# Patient Record
Sex: Male | Born: 1935 | Race: White | Hispanic: No | Marital: Married | Smoking: Never smoker
Health system: Southern US, Community
[De-identification: ages and names within clinical notes are randomized; demographics above are authoritative.]

## PROBLEM LIST (undated history)

## (undated) DIAGNOSIS — B9681 Helicobacter pylori [H. pylori] as the cause of diseases classified elsewhere: Secondary | ICD-10-CM

## (undated) DIAGNOSIS — I10 Essential (primary) hypertension: Secondary | ICD-10-CM

## (undated) DIAGNOSIS — D649 Anemia, unspecified: Secondary | ICD-10-CM

## (undated) DIAGNOSIS — M48061 Spinal stenosis, lumbar region without neurogenic claudication: Secondary | ICD-10-CM

## (undated) DIAGNOSIS — I2510 Atherosclerotic heart disease of native coronary artery without angina pectoris: Secondary | ICD-10-CM

## (undated) DIAGNOSIS — I350 Nonrheumatic aortic (valve) stenosis: Secondary | ICD-10-CM

## (undated) DIAGNOSIS — N400 Enlarged prostate without lower urinary tract symptoms: Secondary | ICD-10-CM

## (undated) DIAGNOSIS — R0602 Shortness of breath: Secondary | ICD-10-CM

## (undated) DIAGNOSIS — I739 Peripheral vascular disease, unspecified: Secondary | ICD-10-CM

## (undated) DIAGNOSIS — T84012A Broken internal right knee prosthesis, initial encounter: Secondary | ICD-10-CM

## (undated) DIAGNOSIS — I441 Atrioventricular block, second degree: Secondary | ICD-10-CM

## (undated) DIAGNOSIS — K219 Gastro-esophageal reflux disease without esophagitis: Secondary | ICD-10-CM

## (undated) DIAGNOSIS — B029 Zoster without complications: Secondary | ICD-10-CM

## (undated) DIAGNOSIS — K279 Peptic ulcer, site unspecified, unspecified as acute or chronic, without hemorrhage or perforation: Secondary | ICD-10-CM

## (undated) DIAGNOSIS — M1990 Unspecified osteoarthritis, unspecified site: Secondary | ICD-10-CM

## (undated) HISTORY — PX: CARDIAC CATHETERIZATION: SHX172

## (undated) HISTORY — PX: CATARACT EXTRACTION, BILATERAL: SHX1313

## (undated) HISTORY — DX: Zoster without complications: B02.9

## (undated) HISTORY — PX: FOOT ARTHROTOMY: SHX952

## (undated) HISTORY — PX: TONSILLECTOMY: SUR1361

## (undated) HISTORY — DX: Spinal stenosis, lumbar region without neurogenic claudication: M48.061

## (undated) HISTORY — PX: REPLACEMENT TOTAL KNEE: SUR1224

## (undated) HISTORY — DX: Peptic ulcer, site unspecified, unspecified as acute or chronic, without hemorrhage or perforation: B96.81

## (undated) HISTORY — PX: HUMERUS FRACTURE SURGERY: SHX670

## (undated) HISTORY — PX: TOE AMPUTATION: SHX809

## (undated) HISTORY — PX: ROTATOR CUFF REPAIR: SHX139

## (undated) HISTORY — PX: AORTIC VALVE REPLACEMENT: SHX41

## (undated) HISTORY — DX: Peptic ulcer, site unspecified, unspecified as acute or chronic, without hemorrhage or perforation: K27.9

---

## 1998-05-21 ENCOUNTER — Inpatient Hospital Stay (HOSPITAL_COMMUNITY): Admission: RE | Admit: 1998-05-21 | Discharge: 1998-05-26 | Payer: Self-pay | Admitting: Orthopedic Surgery

## 1998-05-28 ENCOUNTER — Encounter (HOSPITAL_COMMUNITY): Admission: RE | Admit: 1998-05-28 | Discharge: 1998-08-26 | Payer: Self-pay | Admitting: Orthopedic Surgery

## 1998-10-20 ENCOUNTER — Encounter: Payer: Self-pay | Admitting: Orthopedic Surgery

## 1998-10-21 ENCOUNTER — Inpatient Hospital Stay (HOSPITAL_COMMUNITY): Admission: EM | Admit: 1998-10-21 | Discharge: 1998-10-22 | Payer: Self-pay | Admitting: Orthopedic Surgery

## 1998-10-27 ENCOUNTER — Inpatient Hospital Stay (HOSPITAL_COMMUNITY): Admission: EM | Admit: 1998-10-27 | Discharge: 1998-10-31 | Payer: Self-pay | Admitting: Orthopedic Surgery

## 1998-12-10 ENCOUNTER — Encounter: Admission: RE | Admit: 1998-12-10 | Discharge: 1998-12-10 | Payer: Self-pay | Admitting: Infectious Diseases

## 1998-12-24 ENCOUNTER — Ambulatory Visit (HOSPITAL_COMMUNITY): Admission: RE | Admit: 1998-12-24 | Discharge: 1998-12-24 | Payer: Self-pay | Admitting: Internal Medicine

## 1999-01-20 ENCOUNTER — Encounter: Admission: RE | Admit: 1999-01-20 | Discharge: 1999-01-20 | Payer: Self-pay | Admitting: Infectious Diseases

## 2000-01-28 ENCOUNTER — Encounter (INDEPENDENT_AMBULATORY_CARE_PROVIDER_SITE_OTHER): Payer: Self-pay | Admitting: Specialist

## 2000-01-28 ENCOUNTER — Other Ambulatory Visit: Admission: RE | Admit: 2000-01-28 | Discharge: 2000-01-28 | Payer: Self-pay | Admitting: Gastroenterology

## 2000-06-02 ENCOUNTER — Ambulatory Visit (HOSPITAL_COMMUNITY): Admission: RE | Admit: 2000-06-02 | Discharge: 2000-06-02 | Payer: Self-pay | Admitting: Orthopedic Surgery

## 2000-06-02 ENCOUNTER — Encounter: Payer: Self-pay | Admitting: Orthopedic Surgery

## 2000-06-03 ENCOUNTER — Encounter: Admission: RE | Admit: 2000-06-03 | Discharge: 2000-06-03 | Payer: Self-pay | Admitting: Internal Medicine

## 2000-07-08 ENCOUNTER — Encounter: Admission: RE | Admit: 2000-07-08 | Discharge: 2000-07-08 | Payer: Self-pay | Admitting: Infectious Diseases

## 2000-08-26 ENCOUNTER — Encounter: Admission: RE | Admit: 2000-08-26 | Discharge: 2000-08-26 | Payer: Self-pay | Admitting: Infectious Diseases

## 2000-09-27 ENCOUNTER — Encounter: Admission: RE | Admit: 2000-09-27 | Discharge: 2000-09-27 | Payer: Self-pay | Admitting: Infectious Diseases

## 2000-11-04 ENCOUNTER — Encounter: Payer: Self-pay | Admitting: Orthopedic Surgery

## 2000-11-09 ENCOUNTER — Inpatient Hospital Stay (HOSPITAL_COMMUNITY): Admission: RE | Admit: 2000-11-09 | Discharge: 2000-11-14 | Payer: Self-pay | Admitting: Orthopedic Surgery

## 2000-12-08 ENCOUNTER — Encounter: Admission: RE | Admit: 2000-12-08 | Discharge: 2001-02-03 | Payer: Self-pay | Admitting: *Deleted

## 2002-09-04 ENCOUNTER — Ambulatory Visit (HOSPITAL_BASED_OUTPATIENT_CLINIC_OR_DEPARTMENT_OTHER): Admission: RE | Admit: 2002-09-04 | Discharge: 2002-09-04 | Payer: Self-pay | Admitting: Internal Medicine

## 2002-09-04 ENCOUNTER — Encounter: Payer: Self-pay | Admitting: Pulmonary Disease

## 2002-12-07 ENCOUNTER — Encounter: Payer: Self-pay | Admitting: Pulmonary Disease

## 2005-02-04 ENCOUNTER — Ambulatory Visit: Payer: Self-pay | Admitting: Family Medicine

## 2005-11-18 ENCOUNTER — Ambulatory Visit: Payer: Self-pay | Admitting: Family Medicine

## 2005-11-29 ENCOUNTER — Ambulatory Visit: Payer: Self-pay | Admitting: Cardiology

## 2005-12-31 ENCOUNTER — Ambulatory Visit: Payer: Self-pay

## 2005-12-31 ENCOUNTER — Encounter: Payer: Self-pay | Admitting: Cardiology

## 2006-05-04 ENCOUNTER — Ambulatory Visit: Payer: Self-pay | Admitting: Internal Medicine

## 2006-06-29 ENCOUNTER — Ambulatory Visit: Payer: Self-pay | Admitting: Internal Medicine

## 2006-09-29 ENCOUNTER — Ambulatory Visit: Payer: Self-pay | Admitting: Internal Medicine

## 2006-09-29 LAB — CONVERTED CEMR LAB
ALT: 25 units/L (ref 0–40)
BUN: 18 mg/dL (ref 6–23)
Basophils Absolute: 0 10*3/uL (ref 0.0–0.1)
Creatinine, Ser: 1.1 mg/dL (ref 0.4–1.5)
Creatinine,U: 150.1 mg/dL
Eosinophil percent: 3.2 % (ref 0.0–5.0)
Glomerular Filtration Rate, Af Am: 83 mL/min/{1.73_m2}
HCT: 48 % (ref 39.0–52.0)
Hemoglobin: 16.2 g/dL (ref 13.0–17.0)
LDL DIRECT: 99.5 mg/dL
Lymphocytes Relative: 20.4 % (ref 12.0–46.0)
MCHC: 33.6 g/dL (ref 30.0–36.0)
MCV: 95.7 fL (ref 78.0–100.0)
Monocytes Absolute: 0.9 10*3/uL — ABNORMAL HIGH (ref 0.2–0.7)
Neutro Abs: 4.1 10*3/uL (ref 1.4–7.7)
Neutrophils Relative %: 61.6 % (ref 43.0–77.0)
Potassium: 4.9 meq/L (ref 3.5–5.1)
Sodium: 138 meq/L (ref 135–145)
TSH: 0.66 microintl units/mL (ref 0.35–5.50)
WBC: 6.5 10*3/uL (ref 4.5–10.5)

## 2006-10-07 ENCOUNTER — Ambulatory Visit: Payer: Self-pay | Admitting: Cardiology

## 2006-10-28 ENCOUNTER — Ambulatory Visit: Payer: Self-pay

## 2007-02-16 ENCOUNTER — Ambulatory Visit: Payer: Self-pay | Admitting: Internal Medicine

## 2007-02-16 LAB — CONVERTED CEMR LAB
ALT: 38 units/L (ref 0–40)
AST: 33 units/L (ref 0–37)
Bilirubin, Direct: 0.1 mg/dL (ref 0.0–0.3)
CO2: 23 meq/L (ref 19–32)
Calcium: 9 mg/dL (ref 8.4–10.5)
Chloride: 100 meq/L (ref 96–112)
Cholesterol: 207 mg/dL (ref 0–200)
GFR calc Af Amer: 83 mL/min
Glucose, Bld: 158 mg/dL — ABNORMAL HIGH (ref 70–99)
Sodium: 132 meq/L — ABNORMAL LOW (ref 135–145)
Total Protein: 6.9 g/dL (ref 6.0–8.3)
Triglycerides: 390 mg/dL (ref 0–149)

## 2007-05-19 ENCOUNTER — Ambulatory Visit: Payer: Self-pay | Admitting: Gastroenterology

## 2007-06-08 ENCOUNTER — Encounter: Payer: Self-pay | Admitting: Family Medicine

## 2007-12-07 ENCOUNTER — Ambulatory Visit: Payer: Self-pay | Admitting: Internal Medicine

## 2007-12-07 DIAGNOSIS — I10 Essential (primary) hypertension: Secondary | ICD-10-CM | POA: Insufficient documentation

## 2007-12-07 DIAGNOSIS — D509 Iron deficiency anemia, unspecified: Secondary | ICD-10-CM | POA: Insufficient documentation

## 2007-12-07 DIAGNOSIS — E039 Hypothyroidism, unspecified: Secondary | ICD-10-CM

## 2007-12-07 DIAGNOSIS — N390 Urinary tract infection, site not specified: Secondary | ICD-10-CM

## 2007-12-07 DIAGNOSIS — R972 Elevated prostate specific antigen [PSA]: Secondary | ICD-10-CM | POA: Insufficient documentation

## 2007-12-07 DIAGNOSIS — T50995A Adverse effect of other drugs, medicaments and biological substances, initial encounter: Secondary | ICD-10-CM

## 2007-12-07 LAB — CONVERTED CEMR LAB
ALT: 26 units/L (ref 0–53)
AST: 25 units/L (ref 0–37)
Basophils Relative: 0.1 % (ref 0.0–1.0)
Bilirubin, Direct: 0.2 mg/dL (ref 0.0–0.3)
CO2: 24 meq/L (ref 19–32)
Calcium: 9.1 mg/dL (ref 8.4–10.5)
Chloride: 101 meq/L (ref 96–112)
Eosinophils Absolute: 0.3 10*3/uL (ref 0.0–0.6)
Eosinophils Relative: 5.6 % — ABNORMAL HIGH (ref 0.0–5.0)
GFR calc non Af Amer: 62 mL/min
Glucose, Bld: 199 mg/dL — ABNORMAL HIGH (ref 70–99)
HDL: 45.6 mg/dL (ref >39.0)
Hgb A1c MFr Bld: 7.8 % — ABNORMAL HIGH (ref 4.6–6.0)
Ketones, urine, test strip: NEGATIVE
Nitrite: NEGATIVE
PSA: 3.88 ng/mL (ref 0.10–4.00)
Platelets: 160 10*3/uL (ref 150–400)
RBC: 4.99 M/uL (ref 4.22–5.81)
RDW: 13 % (ref 11.5–14.6)
TSH: 0.92 microintl units/mL (ref 0.35–5.50)
Total CHOL/HDL Ratio: 3.9
Triglycerides: 293 mg/dL (ref 0–149)
Urobilinogen, UA: 0.2
VLDL: 59 mg/dL — ABNORMAL HIGH (ref 0–40)
WBC: 5.7 10*3/uL (ref 4.5–10.5)

## 2007-12-11 ENCOUNTER — Ambulatory Visit: Payer: Self-pay | Admitting: Cardiology

## 2007-12-22 ENCOUNTER — Ambulatory Visit: Payer: Self-pay

## 2007-12-22 ENCOUNTER — Encounter: Payer: Self-pay | Admitting: Cardiology

## 2008-04-24 ENCOUNTER — Ambulatory Visit: Payer: Self-pay | Admitting: Family Medicine

## 2008-04-24 LAB — CONVERTED CEMR LAB: Hgb A1c MFr Bld: 8.4 % — ABNORMAL HIGH (ref 4.6–6.0)

## 2008-06-28 ENCOUNTER — Encounter: Payer: Self-pay | Admitting: Family Medicine

## 2008-08-07 ENCOUNTER — Ambulatory Visit: Payer: Self-pay | Admitting: Internal Medicine

## 2008-08-08 ENCOUNTER — Encounter: Payer: Self-pay | Admitting: Internal Medicine

## 2008-10-22 ENCOUNTER — Telehealth: Payer: Self-pay | Admitting: Internal Medicine

## 2008-12-26 ENCOUNTER — Encounter: Payer: Self-pay | Admitting: Family Medicine

## 2008-12-26 ENCOUNTER — Ambulatory Visit: Payer: Self-pay | Admitting: Internal Medicine

## 2008-12-26 LAB — CONVERTED CEMR LAB
ALT: 20 units/L (ref 0–53)
Albumin: 4 g/dL (ref 3.5–5.2)
Alkaline Phosphatase: 51 units/L (ref 39–117)
BUN: 24 mg/dL — ABNORMAL HIGH (ref 6–23)
Bilirubin Urine: NEGATIVE
CO2: 23 meq/L (ref 19–32)
Calcium: 9.3 mg/dL (ref 8.4–10.5)
Eosinophils Relative: 3.5 % (ref 0.0–5.0)
GFR calc Af Amer: 92 mL/min
Glucose, Bld: 163 mg/dL — ABNORMAL HIGH (ref 70–99)
HCT: 41.6 % (ref 39.0–52.0)
Hemoglobin: 14.1 g/dL (ref 13.0–17.0)
Microalb Creat Ratio: 21.5 mg/g (ref 0.0–30.0)
Microalb, Ur: 2.7 mg/dL — ABNORMAL HIGH (ref 0.0–1.9)
Monocytes Absolute: 0.7 10*3/uL (ref 0.1–1.0)
Monocytes Relative: 13.6 % — ABNORMAL HIGH (ref 3.0–12.0)
Neutro Abs: 3.3 10*3/uL (ref 1.4–7.7)
Nitrite: NEGATIVE
RBC: 4.16 M/uL — ABNORMAL LOW (ref 4.22–5.81)
Testosterone: 544.58 ng/dL (ref 350.00–890)
Total CHOL/HDL Ratio: 5.6
Total Protein: 6.9 g/dL (ref 6.0–8.3)
Urobilinogen, UA: 0.2
WBC Urine, dipstick: NEGATIVE
WBC: 5.3 10*3/uL (ref 4.5–10.5)

## 2008-12-30 ENCOUNTER — Ambulatory Visit: Payer: Self-pay | Admitting: Cardiology

## 2009-01-01 LAB — CONVERTED CEMR LAB: Vit D, 1,25-Dihydroxy: 5 — ABNORMAL LOW (ref 30–89)

## 2009-01-13 ENCOUNTER — Telehealth: Payer: Self-pay | Admitting: Family Medicine

## 2009-03-26 ENCOUNTER — Ambulatory Visit: Payer: Self-pay | Admitting: Internal Medicine

## 2009-03-26 LAB — CONVERTED CEMR LAB: Hgb A1c MFr Bld: 6.9 % — ABNORMAL HIGH (ref 4.6–6.5)

## 2009-04-21 ENCOUNTER — Encounter: Admission: RE | Admit: 2009-04-21 | Discharge: 2009-07-20 | Payer: Self-pay | Admitting: Family Medicine

## 2009-04-30 ENCOUNTER — Encounter: Payer: Self-pay | Admitting: Family Medicine

## 2009-05-27 ENCOUNTER — Encounter: Payer: Self-pay | Admitting: Family Medicine

## 2009-06-06 ENCOUNTER — Ambulatory Visit: Payer: Self-pay | Admitting: Family Medicine

## 2009-06-12 ENCOUNTER — Encounter: Payer: Self-pay | Admitting: Family Medicine

## 2009-06-16 ENCOUNTER — Encounter: Payer: Self-pay | Admitting: Family Medicine

## 2009-06-26 ENCOUNTER — Encounter (HOSPITAL_BASED_OUTPATIENT_CLINIC_OR_DEPARTMENT_OTHER): Admission: RE | Admit: 2009-06-26 | Discharge: 2009-07-22 | Payer: Self-pay | Admitting: General Surgery

## 2009-06-30 ENCOUNTER — Encounter: Payer: Self-pay | Admitting: Family Medicine

## 2009-07-03 ENCOUNTER — Ambulatory Visit: Payer: Self-pay | Admitting: Internal Medicine

## 2009-07-03 LAB — CONVERTED CEMR LAB: Hgb A1c MFr Bld: 6.3 % (ref 4.6–6.5)

## 2009-07-07 ENCOUNTER — Ambulatory Visit: Payer: Self-pay | Admitting: *Deleted

## 2009-07-07 ENCOUNTER — Ambulatory Visit: Admission: RE | Admit: 2009-07-07 | Discharge: 2009-07-07 | Payer: Self-pay | Admitting: General Surgery

## 2009-07-07 ENCOUNTER — Encounter (HOSPITAL_BASED_OUTPATIENT_CLINIC_OR_DEPARTMENT_OTHER): Payer: Self-pay | Admitting: General Surgery

## 2009-07-21 ENCOUNTER — Encounter: Payer: Self-pay | Admitting: Pulmonary Disease

## 2009-07-21 DIAGNOSIS — G4733 Obstructive sleep apnea (adult) (pediatric): Secondary | ICD-10-CM | POA: Insufficient documentation

## 2009-07-25 ENCOUNTER — Ambulatory Visit: Payer: Self-pay | Admitting: Pulmonary Disease

## 2009-10-03 ENCOUNTER — Encounter: Payer: Self-pay | Admitting: Cardiology

## 2009-10-16 ENCOUNTER — Ambulatory Visit: Payer: Self-pay | Admitting: Internal Medicine

## 2009-12-25 ENCOUNTER — Telehealth: Payer: Self-pay | Admitting: Family Medicine

## 2010-01-08 ENCOUNTER — Encounter: Payer: Self-pay | Admitting: Pulmonary Disease

## 2010-02-16 DIAGNOSIS — I25810 Atherosclerosis of coronary artery bypass graft(s) without angina pectoris: Secondary | ICD-10-CM | POA: Insufficient documentation

## 2010-02-16 DIAGNOSIS — I359 Nonrheumatic aortic valve disorder, unspecified: Secondary | ICD-10-CM | POA: Insufficient documentation

## 2010-02-16 DIAGNOSIS — E119 Type 2 diabetes mellitus without complications: Secondary | ICD-10-CM

## 2010-02-16 DIAGNOSIS — I10 Essential (primary) hypertension: Secondary | ICD-10-CM | POA: Insufficient documentation

## 2010-02-20 ENCOUNTER — Ambulatory Visit: Payer: Self-pay | Admitting: Cardiology

## 2010-02-25 ENCOUNTER — Ambulatory Visit: Payer: Self-pay | Admitting: Family Medicine

## 2010-02-25 LAB — CONVERTED CEMR LAB
ALT: 22 units/L (ref 0–53)
BUN: 19 mg/dL (ref 6–23)
Basophils Absolute: 0 10*3/uL (ref 0.0–0.1)
Basophils Relative: 0.2 % (ref 0.0–3.0)
Bilirubin Urine: NEGATIVE
Bilirubin, Direct: 0 mg/dL (ref 0.0–0.3)
Calcium: 8.5 mg/dL (ref 8.4–10.5)
Cholesterol: 122 mg/dL (ref 0–200)
Creatinine, Ser: 1 mg/dL (ref 0.4–1.5)
Eosinophils Absolute: 0.2 10*3/uL (ref 0.0–0.7)
GFR calc non Af Amer: 75.97 mL/min (ref >60)
HDL: 48.9 mg/dL (ref >39.00)
Hgb A1c MFr Bld: 6.9 % — ABNORMAL HIGH (ref 4.6–6.5)
Ketones, urine, test strip: NEGATIVE
LDL Cholesterol: 37 mg/dL (ref 0–99)
Lymphocytes Relative: 18.3 % (ref 12.0–46.0)
MCHC: 32.7 g/dL (ref 30.0–36.0)
MCV: 96.6 fL (ref 78.0–100.0)
Microalb Creat Ratio: 5.9 mg/g (ref 0.0–30.0)
Microalb, Ur: 0.5 mg/dL (ref 0.0–1.9)
Monocytes Absolute: 0.9 10*3/uL (ref 0.1–1.0)
Neutro Abs: 5.3 10*3/uL (ref 1.4–7.7)
Neutrophils Relative %: 67.3 % (ref 43.0–77.0)
Nitrite: NEGATIVE
PSA: 3.26 ng/mL (ref 0.10–4.00)
Protein, U semiquant: NEGATIVE
RBC: 4.24 M/uL (ref 4.22–5.81)
RDW: 13.3 % (ref 11.5–14.6)
TSH: 0.99 microintl units/mL (ref 0.35–5.50)
Total Bilirubin: 0.3 mg/dL (ref 0.3–1.2)
Total CHOL/HDL Ratio: 2
Triglycerides: 182 mg/dL — ABNORMAL HIGH (ref 0.0–149.0)
Urobilinogen, UA: 0.2
VLDL: 36.4 mg/dL (ref 0.0–40.0)

## 2010-03-13 ENCOUNTER — Ambulatory Visit: Payer: Self-pay

## 2010-03-13 ENCOUNTER — Encounter: Payer: Self-pay | Admitting: Cardiology

## 2010-03-13 ENCOUNTER — Ambulatory Visit: Payer: Self-pay | Admitting: Cardiology

## 2010-03-13 ENCOUNTER — Ambulatory Visit (HOSPITAL_COMMUNITY): Admission: RE | Admit: 2010-03-13 | Discharge: 2010-03-13 | Payer: Self-pay | Admitting: Cardiology

## 2010-03-30 ENCOUNTER — Encounter: Payer: Self-pay | Admitting: Family Medicine

## 2010-05-22 ENCOUNTER — Ambulatory Visit: Payer: Self-pay | Admitting: Family Medicine

## 2010-06-09 ENCOUNTER — Encounter: Payer: Self-pay | Admitting: Cardiovascular Disease

## 2010-06-09 ENCOUNTER — Encounter: Payer: Self-pay | Admitting: Cardiology

## 2010-06-12 ENCOUNTER — Ambulatory Visit: Payer: Self-pay | Admitting: Family Medicine

## 2010-06-12 LAB — CONVERTED CEMR LAB
BUN: 22 mg/dL (ref 6–23)
Basophils Absolute: 0 10*3/uL (ref 0.0–0.1)
Calcium: 9 mg/dL (ref 8.4–10.5)
Eosinophils Relative: 1.9 % (ref 0.0–5.0)
GFR calc non Af Amer: 94.13 mL/min (ref >60)
Glucose, Bld: 91 mg/dL (ref 70–99)
Lymphocytes Relative: 13.8 % (ref 12.0–46.0)
Monocytes Relative: 10.8 % (ref 3.0–12.0)
Platelets: 206 10*3/uL (ref 150.0–400.0)
Potassium: 5 meq/L (ref 3.5–5.1)
RDW: 17.2 % — ABNORMAL HIGH (ref 11.5–14.6)
Sodium: 136 meq/L (ref 135–145)
WBC: 8 10*3/uL (ref 4.5–10.5)
aPTT: 25.9 s (ref 21.7–28.8)

## 2010-06-19 ENCOUNTER — Ambulatory Visit (HOSPITAL_COMMUNITY): Admission: RE | Admit: 2010-06-19 | Discharge: 2010-06-19 | Payer: Self-pay | Admitting: Cardiology

## 2010-06-19 ENCOUNTER — Ambulatory Visit: Payer: Self-pay | Admitting: Cardiology

## 2010-06-23 ENCOUNTER — Encounter: Payer: Self-pay | Admitting: Cardiology

## 2010-06-24 ENCOUNTER — Ambulatory Visit: Payer: Self-pay | Admitting: Cardiology

## 2010-06-24 ENCOUNTER — Telehealth: Payer: Self-pay | Admitting: Cardiology

## 2010-06-24 DIAGNOSIS — I2510 Atherosclerotic heart disease of native coronary artery without angina pectoris: Secondary | ICD-10-CM | POA: Insufficient documentation

## 2010-06-25 ENCOUNTER — Ambulatory Visit: Payer: Self-pay | Admitting: Cardiothoracic Surgery

## 2010-06-25 ENCOUNTER — Encounter: Payer: Self-pay | Admitting: Family Medicine

## 2010-07-10 ENCOUNTER — Encounter (HOSPITAL_BASED_OUTPATIENT_CLINIC_OR_DEPARTMENT_OTHER): Admission: RE | Admit: 2010-07-10 | Discharge: 2010-10-08 | Payer: Self-pay | Admitting: General Surgery

## 2010-07-30 ENCOUNTER — Ambulatory Visit: Payer: Self-pay | Admitting: Cardiothoracic Surgery

## 2010-07-30 ENCOUNTER — Encounter: Payer: Self-pay | Admitting: Cardiology

## 2010-08-13 ENCOUNTER — Encounter: Payer: Self-pay | Admitting: Cardiothoracic Surgery

## 2010-08-17 ENCOUNTER — Ambulatory Visit: Payer: Self-pay | Admitting: Cardiology

## 2010-08-17 ENCOUNTER — Encounter: Payer: Self-pay | Admitting: Cardiothoracic Surgery

## 2010-08-17 ENCOUNTER — Ambulatory Visit: Payer: Self-pay | Admitting: Cardiothoracic Surgery

## 2010-08-17 ENCOUNTER — Inpatient Hospital Stay (HOSPITAL_COMMUNITY): Admission: RE | Admit: 2010-08-17 | Discharge: 2010-08-22 | Payer: Self-pay | Admitting: Cardiothoracic Surgery

## 2010-08-28 ENCOUNTER — Encounter: Payer: Self-pay | Admitting: Cardiology

## 2010-09-01 DIAGNOSIS — Z952 Presence of prosthetic heart valve: Secondary | ICD-10-CM | POA: Insufficient documentation

## 2010-09-04 ENCOUNTER — Ambulatory Visit: Payer: Self-pay | Admitting: Cardiology

## 2010-09-04 DIAGNOSIS — I441 Atrioventricular block, second degree: Secondary | ICD-10-CM

## 2010-09-08 ENCOUNTER — Encounter: Payer: Self-pay | Admitting: Cardiology

## 2010-09-14 ENCOUNTER — Encounter: Payer: Self-pay | Admitting: Cardiology

## 2010-09-14 ENCOUNTER — Ambulatory Visit: Payer: Self-pay | Admitting: Cardiothoracic Surgery

## 2010-09-14 ENCOUNTER — Encounter: Admission: RE | Admit: 2010-09-14 | Discharge: 2010-09-14 | Payer: Self-pay | Admitting: Cardiothoracic Surgery

## 2010-09-16 ENCOUNTER — Ambulatory Visit: Payer: Self-pay | Admitting: Family Medicine

## 2010-09-17 ENCOUNTER — Ambulatory Visit: Payer: Self-pay | Admitting: Family Medicine

## 2010-09-17 LAB — CONVERTED CEMR LAB
BUN: 31 mg/dL — ABNORMAL HIGH (ref 6–23)
Basophils Absolute: 0 10*3/uL (ref 0.0–0.1)
Chloride: 99 meq/L (ref 96–112)
Eosinophils Relative: 0.8 % (ref 0.0–5.0)
Glucose, Bld: 132 mg/dL — ABNORMAL HIGH (ref 70–99)
HCT: 34 % — ABNORMAL LOW (ref 39.0–52.0)
Hemoglobin: 11.1 g/dL — ABNORMAL LOW (ref 13.0–17.0)
Hgb A1c MFr Bld: 7 % — ABNORMAL HIGH (ref 4.6–6.5)
Lymphs Abs: 1.5 10*3/uL (ref 0.7–4.0)
MCV: 90 fL (ref 78.0–100.0)
Monocytes Absolute: 1.4 10*3/uL — ABNORMAL HIGH (ref 0.1–1.0)
Neutro Abs: 7.1 10*3/uL (ref 1.4–7.7)
Platelets: 270 10*3/uL (ref 150.0–400.0)
Potassium: 4.9 meq/L (ref 3.5–5.1)
RDW: 16.7 % — ABNORMAL HIGH (ref 11.5–14.6)

## 2010-09-20 ENCOUNTER — Encounter (HOSPITAL_COMMUNITY)
Admission: RE | Admit: 2010-09-20 | Discharge: 2010-12-19 | Payer: Self-pay | Source: Home / Self Care | Attending: Cardiology | Admitting: Cardiology

## 2010-09-24 ENCOUNTER — Encounter: Payer: Self-pay | Admitting: Family Medicine

## 2010-09-29 ENCOUNTER — Telehealth: Payer: Self-pay | Admitting: Family Medicine

## 2010-10-01 ENCOUNTER — Encounter: Payer: Self-pay | Admitting: Family Medicine

## 2010-10-01 ENCOUNTER — Ambulatory Visit: Payer: Self-pay | Admitting: Cardiothoracic Surgery

## 2010-10-02 ENCOUNTER — Encounter: Payer: Self-pay | Admitting: Family Medicine

## 2010-10-02 ENCOUNTER — Telehealth: Payer: Self-pay | Admitting: Family Medicine

## 2010-10-07 ENCOUNTER — Encounter: Payer: Self-pay | Admitting: Cardiology

## 2010-10-08 ENCOUNTER — Ambulatory Visit: Payer: Self-pay | Admitting: Cardiology

## 2010-10-08 DIAGNOSIS — I959 Hypotension, unspecified: Secondary | ICD-10-CM | POA: Insufficient documentation

## 2010-10-09 ENCOUNTER — Encounter
Admission: RE | Admit: 2010-10-09 | Discharge: 2011-01-05 | Payer: Self-pay | Source: Home / Self Care | Attending: General Surgery | Admitting: General Surgery

## 2010-11-08 ENCOUNTER — Encounter: Payer: Self-pay | Admitting: Cardiology

## 2010-11-10 ENCOUNTER — Telehealth: Payer: Self-pay | Admitting: Family Medicine

## 2010-11-20 ENCOUNTER — Telehealth: Payer: Self-pay | Admitting: Family Medicine

## 2010-11-24 ENCOUNTER — Ambulatory Visit: Payer: Self-pay | Admitting: Family Medicine

## 2010-11-26 ENCOUNTER — Ambulatory Visit: Payer: Self-pay | Admitting: Cardiothoracic Surgery

## 2010-11-26 ENCOUNTER — Encounter: Payer: Self-pay | Admitting: Family Medicine

## 2010-12-01 LAB — CONVERTED CEMR LAB
ALT: 17 units/L (ref 0–53)
AST: 22 units/L (ref 0–37)
Albumin: 4.1 g/dL (ref 3.5–5.2)
Alkaline Phosphatase: 69 units/L (ref 39–117)
Basophils Absolute: 0.1 10*3/uL (ref 0.0–0.1)
Bilirubin, Direct: 0.1 mg/dL (ref 0.0–0.3)
Cholesterol: 190 mg/dL (ref 0–200)
Eosinophils Absolute: 0.1 10*3/uL (ref 0.0–0.7)
HCT: 32.4 % — ABNORMAL LOW (ref 39.0–52.0)
Lymphs Abs: 1.4 10*3/uL (ref 0.7–4.0)
MCHC: 32.4 g/dL (ref 30.0–36.0)
Monocytes Relative: 13.3 % — ABNORMAL HIGH (ref 3.0–12.0)
Neutro Abs: 4.9 10*3/uL (ref 1.4–7.7)
Platelets: 296 10*3/uL (ref 150.0–400.0)
RDW: 18.5 % — ABNORMAL HIGH (ref 11.5–14.6)
Total Protein: 6.7 g/dL (ref 6.0–8.3)
VLDL: 41.6 mg/dL — ABNORMAL HIGH (ref 0.0–40.0)

## 2010-12-25 ENCOUNTER — Encounter: Payer: Self-pay | Admitting: Cardiology

## 2010-12-31 ENCOUNTER — Telehealth: Payer: Self-pay | Admitting: Family Medicine

## 2011-01-04 ENCOUNTER — Ambulatory Visit
Admission: RE | Admit: 2011-01-04 | Discharge: 2011-01-04 | Payer: Self-pay | Source: Home / Self Care | Attending: Cardiology | Admitting: Cardiology

## 2011-01-04 ENCOUNTER — Encounter: Payer: Self-pay | Admitting: Cardiology

## 2011-01-17 LAB — CONVERTED CEMR LAB: LDL Goal: 100 mg/dL

## 2011-01-19 NOTE — Letter (Signed)
Summary: [facility]-Additional Note  [facility]-Additional Note   Imported By: Maryln Gottron 09/23/2010 10:46:58  _____________________________________________________________________  External Attachment:    Type:   Image     Comment:   External Document

## 2011-01-19 NOTE — Assessment & Plan Note (Signed)
Summary: f1y/jml   Visit Type:  1 yr f/u Primary Provider:  Nelwyn Salisbury MD  CC:  no cardiac complaints today.  History of Present Illness: Stanley Stanley [name] returns today for evaluation management of his history of subclinical coronary disease, nonobstructive carotid artery disease, mild aortic stenosis, hypertension, hyperlipidemia, and obstructive sleep apnea.  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 He offers no complaints today including angina, ischemic equivalent symptoms, presyncope, syncope, palpitations, or dyspnea on exertion.  His blood work is being followed by his primary care, Stanley.Fry. He is due lipids which you have done their office  Lipid Management History:      Positive NCEP/ATP III risk factors include male age 30 years old or older, diabetes, and hypertension.  Negative NCEP/ATP III risk factors include non-tobacco-user status.    Current Medications (verified): 1)  Prochlorperazine Maleate 10 Mg Tabs (Prochlorperazine Maleate) .Marland Kitchen.. 1 Every 4-6 Hrs As Needed Nausea Vomiting 2)  Glipizide Xl 10 Mg Xr24h-Tab (Glipizide) .... Two Tabs Once Daily 3)  Diclofenac Sodium 75 Mg Tbec (Diclofenac Sodium) .Marland Kitchen.. 1 By Mouth Two Times A Day After Meals 4)  Flomax 0.4 Mg Caps (Tamsulosin Hcl) .... Once Daily 5)  Actos 30 Mg Tabs (Pioglitazone Hcl) .... Once Daily 6)  Crestor 10 Mg Tabs (Rosuvastatin Calcium) .... Once Daily 7)  Zetia 10 Mg Tabs (Ezetimibe) .... Once Daily 8)  Avodart 0.5 Mg Caps (Dutasteride) .... Once Daily 9)  Aspir-Low 81 Mg Tbec (Aspirin) .... Once Daily 10)  Benicar 20 Mg Tabs (Olmesartan Medoxomil) .... Once Daily 11)  Janumet 50-500 Mg Tabs (Sitagliptin-Metformin Hcl) .Marland Kitchen.. 1 Tab Once Daily 12)  Lotrisone 1-0.05 % Crea (Clotrimazole-Betamethasone) .... Apply Two Times A Day As Directed 13)  Calcium-Vitamin D 500-200 Mg-Unit Tabs (Calcium Carbonate-Vitamin D) .Marland Kitchen.. 1 Tab Once Daily 14)  Cpap .... At Bedtime  Allergies: 1)  ! Ace Inhibitors  Past  History:  Past Medical History: Last updated: 02/28/10 CAROTID ARTERY DISEASE (ICD-433.10) AORTIC STENOSIS (ICD-424.1) HYPERTENSION (ICD-401.9) HYPERLIPIDEMIA (ICD-272.4) DIABETES MELLITUS, TYPE II (ICD-250.00) OBSTRUCTIVE SLEEP APNEA (ICD-327.23) ELEVATED PROSTATE SPECIFIC ANTIGEN (ICD-790.93) HYPOTHYROIDISM (ICD-244.9) ANEMIA (ICD-285.9) UNS ADVRS EFF OTH RX MEDICINAL&BIOLOGICAL SBSTNC (ZOX-096.04) UTI (ICD-599.0)  Past Surgical History: Last updated: 02-28-2010 bilateral knee replacments Tonsillectomy Prostate surgery  Family History: Last updated: Feb 28, 2010 Father: deceased @ 32 due to blood clots  Mother: deceased @ 29 due to lymphoma  Social History: Last updated: 02/28/10 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  quit 1980 Alcohol Use - yes Drug Use - no  Risk Factors: Smoking Status: quit (02-28-2010)  Review of Systems       negative other than history of present illness  Vital Signs:  Patient profile:   77 year old male Height:      68 inches Weight:      201 pounds Pulse rate:   72 / minute Pulse rhythm:   irregular BP sitting:   94 / 50  (left arm) Cuff size:   large  Vitals Entered By: Stanley [name], CMA (February 20, 2010 3:29 PM)  Physical Exam  General:  Well developed, well nourished, in no acute distress. Head:  normocephalic and atraumatic Eyes:  PERRLA/EOM intact; conjunctiva and lids normal. Neck:  Neck supple, no JVD. No masses, thyromegaly or abnormal cervical nodes. Chest Stanley [name]:  no deformities or breast masses noted Lungs:  Clear bilaterally to auscultation and percussion. Heart:  normal PMI, regular rate and rhythm, classic soft aortic stenosis murmur. S2 splits but  is very difficult to hear. No significant diastolic murmur Abdomen:  Bowel sounds positive; abdomen soft and non-tender without masses, organomegaly, or hernias noted. No hepatosplenomegaly. Msk:  decreased ROM.   Pulses:  pulses normal in all 4  extremities Extremities:  No clubbing or cyanosis. Neurologic:  Alert and oriented x 3. Skin:  Intact without lesions or rashes. Psych:  Normal affect.   Impression & Recommendations:  Problem # 1:  AORTIC STENOSIS (ICD-424.1)  His updated medication list for this problem includes:    Benicar 20 Mg Tabs (Olmesartan medoxomil) ..... Once daily  Orders: Echocardiogram (Echo) EKG w/ Interpretation (93000)  Problem # 2:  CAROTID ARTERY DISEASE (ICD-433.10)  His updated medication list for this problem includes:    Aspir-low 81 Mg Tbec (Aspirin) ..... Once daily  Orders: Carotid Duplex (Carotid Duplex)  Problem # 3:  HYPERTENSION (ICD-401.9) Assessment: Improved  His updated medication list for this problem includes:    Aspir-low 81 Mg Tbec (Aspirin) ..... Once daily    Benicar 20 Mg Tabs (Olmesartan medoxomil) ..... Once daily  Problem # 4:  HYPERLIPIDEMIA (ICD-272.4) Assessment: Unchanged  His updated medication list for this problem includes:    Crestor 10 Mg Tabs (Rosuvastatin calcium) ..... Once daily    Zetia 10 Mg Tabs (Ezetimibe) ..... Once daily  Problem # 5:  DIABETES MELLITUS, TYPE II (ICD-250.00)  His updated medication list for this problem includes:    Glipizide Xl 10 Mg Xr24h-tab (Glipizide) .Marland Kitchen..Marland Kitchen Two tabs once daily    Actos 30 Mg Tabs (Pioglitazone hcl) ..... Once daily    Aspir-low 81 Mg Tbec (Aspirin) ..... Once daily    Benicar 20 Mg Tabs (Olmesartan medoxomil) ..... Once daily    Janumet 50-500 Mg Tabs (Sitagliptin-metformin hcl) .Marland Kitchen... 1 tab once daily  Problem # 6:  OBSTRUCTIVE SLEEP APNEA (ICD-327.23)  Lipid Assessment/Plan:      Based on NCEP/ATP III, the patient's risk factor category is "history of diabetes".  The patient's lipid goals are as follows: Total cholesterol goal is 200; LDL cholesterol goal is 100; HDL cholesterol goal is 40; Triglyceride goal is 150.     Patient Instructions: 1)  Your physician recommends that you schedule a  follow-up appointment in: YEAR  WITH Stanley [name] 2)  Your physician recommends that you continue on your current medications as directed. Please refer to the Current Medication list given to you today. 3)  Your physician has requested that you have a carotid duplex. This test is an ultrasound of the carotid arteries in your neck. It looks at blood flow through these arteries that supply the brain with blood. Allow one hour for this exam. There are no restrictions or special instructions. 4)  Your physician has requested that you have an echocardiogram.  Echocardiography is a painless test that uses sound waves to create images of your heart. It provides your doctor with information about the size and shape of your heart and how well your heart's chambers and valves are working.  This procedure takes approximately one hour. There are no restrictions for this procedure.

## 2011-01-19 NOTE — Letter (Signed)
Summary: [facility]   [facility]   Imported By: Roderic Ovens 10/19/2010 10:42:30  _____________________________________________________________________  External Attachment:    Type:   Image     Comment:   External Document

## 2011-01-19 NOTE — Cardiovascular Report (Signed)
Summary: Cath/Percutaneous Orders   Cath/Percutaneous Orders   Imported By: Roderic Ovens 06/24/2010 15:00:08  _____________________________________________________________________  External Attachment:    Type:   Image     Comment:   External Document

## 2011-01-19 NOTE — Assessment & Plan Note (Signed)
Summary: ROV   Visit Type:  rov Primary Provider:  Nelwyn Salisbury MD  CC:  pt states his BP has been going up and down ....had some pedal edema...denies any sob or cp.  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 He's been lightheaded and dizzy.  he denies syncope.  He has decreased his metoprolol to 12 1/2 mg twice a day. He is on no other antihypertensives. He been on benazepril the past.  He is eating and drinking fluids appropriately. He denies any blood loss or melena. He's had no chest pain or angina. He's had minimal pedal edema the end of the day.  Current Medications (verified): 1)  Glipizide Xl 10 Mg Xr24h-Tab (Glipizide) .... Two Tabs Once Daily 2)  Diclofenac Sodium 75 Mg Tbec (Diclofenac Sodium) .Marland Kitchen.. 1 By Mouth Two Times A Day After Meals 3)  Flomax 0.4 Mg Caps (Tamsulosin Hcl) .... Once Daily 4)  Actos 30 Mg Tabs (Pioglitazone Hcl) .... Once Daily 5)  Crestor 10 Mg Tabs (Rosuvastatin Calcium) .... Once Daily 6)  Zetia 10 Mg Tabs (Ezetimibe) .... Once Daily 7)  Avodart 0.5 Mg Caps (Dutasteride) .... Once Daily 8)  Aspirin Ec 325 Mg Tbec (Aspirin) .... Take One Tablet By Mouth Daily 9)  Janumet 50-500 Mg Tabs (Sitagliptin-Metformin Hcl) .Marland Kitchen.. 1 Tab Two Times A Day 10)  Calcium-Vitamin D 500-200 Mg-Unit Tabs (Calcium Carbonate-Vitamin D) .Marland Kitchen.. 1 Tab Once Daily 11)  Cpap .... At Bedtime 12)  Nu-Iron 150 Mg Caps (Polysaccharide Iron Complex) .Marland Kitchen.. 1 Cap Once Daily 13)  Metoprolol Tartrate 25 Mg Tabs (Metoprolol Tartrate) .... 1/2 Tab 1-2 Times Daily Depending On Bp 14)  Enablex 15 Mg Xr24h-Tab (Darifenacin Hydrobromide) .Marland Kitchen.. 1 Tab Qam 15)  Nexium 40 Mg Cpdr (Esomeprazole Magnesium) .... Once Daily 16)  Accu-Chek Soft Touch Lancets  Misc (Lancets) .... Use As Directed 17)  Accu-Chek Aviva  Strp (Glucose Blood) .... Test As Directed  Allergies: 1)  ! Ace Inhibitors  Past History:  Past Medical  History: Last updated: 09/17/2010 CAROTID ARTERY DISEASE (ICD-433.10), sees Dr. Juanito Doom AORTIC STENOSIS (ICD-424.1) HYPERTENSION (ICD-401.9) HYPERLIPIDEMIA (ICD-272.4) DIABETES MELLITUS, TYPE II (ICD-250.00) OBSTRUCTIVE SLEEP APNEA (ICD-327.23) ELEVATED PROSTATE SPECIFIC ANTIGEN (ICD-790.93) HYPOTHYROIDISM (ICD-244.9) ANEMIA (ICD-285.9) UNS ADVRS EFF OTH RX MEDICINAL&BIOLOGICAL SBSTNC (UJW-119.14) UTI (ICD-599.0)  Past Surgical History: Last updated: 09/17/2010 bilateral knee replacements Tonsillectomy Prostate surgery  Aortic valve replacement  on 08-18-10 with a 25-mm Edwards   [employer] pericardial tissue valve, model #3300 TFX, serial number   [id number] and coronary artery bypass grafting x2 with the left internal   mammary to the left anterior descending coronary artery and reversed   saphenous vein graft to the posterior descending coronary artery with   left thigh endovein harvesting.  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  quit 1980 Alcohol Use - yes Drug Use - no  Risk Factors: Smoking Status: quit (09/17/2010)  Review of Systems       negative other than history of present illness  Vital Signs:  Patient profile:   77 year old male Height:      68 inches Weight:      195.8 pounds BMI:     29.88 Pulse rate:  74 / minute Pulse rhythm:   irregular BP sitting:   92 / 58  (left arm) Cuff size:   large  Vitals Entered By: Danielle Rankin, CMA (October 08, 2010 1:52 PM)  Physical Exam  General:  Well developed, well nourished, in no acute distress. Head:  normocephalic and atraumatic Eyes:  PERRLA/EOM intact; conjunctiva and lids normal. Neck:  Neck supple, no JVD. No masses, thyromegaly or abnormal cervical nodes. Lungs:  Clear  bilaterally to auscultation and percussion. Heart:  PMI nondisplaced, soft systolic murmur, S2 splits, no diastolic component, no gallop carotids full with bruit Msk:  decreased ROM.   Pulses:  pulses normal in all 4 extremities Extremities:  No clubbing or cyanosis.no significant Neurologic:  Alert and oriented x 3. Skin:  Intact without lesions or rashes. Psych:  Normal affect.   Problems:  Medical Problems Added: 1)  Dx of Hypotension, Unspecified  (ICD-458.9)  Impression & Recommendations:  Problem # 1:  HYPOTENSION, UNSPECIFIED (ICD-458.9) Assessment New I have asked him to take 12-1/2 and metoprolol q.h.s. per week and then discontinue altogether. He will salt to his diet but avoid significant edema. Orthostatic precautions were reinforced. He is to drink plenty of fluids. I will increase his maximum heart rate at cardiac rehabilitation to 110 beats per minute.  Problem # 2:  CAD, NATIVE VESSEL (ICD-414.01) Assessment: Unchanged  His updated medication list for this problem includes:    Aspirin Ec 325 Mg Tbec (Aspirin) .Marland Kitchen... Take one tablet by mouth daily    Metoprolol Tartrate 25 Mg Tabs (Metoprolol tartrate) .Marland Kitchen... Take 1/2 tab by mouth at bedtime x 1 week then stop  Problem # 3:  CAROTID ARTERY DISEASE (ICD-433.10) Assessment: Unchanged  His updated medication list for this problem includes:    Aspirin Ec 325 Mg Tbec (Aspirin) .Marland Kitchen... Take one tablet by mouth daily  Problem # 4:  AORTIC STENOSIS (ICD-424.1) Assessment: Improved status post replacement The following medications were removed from the medication list:    Benicar 5 Mg Tabs (Olmesartan medoxomil) ..... Once daily His updated medication list for this problem includes:    Metoprolol Tartrate 25 Mg Tabs (Metoprolol tartrate) .Marland Kitchen... Take 1/2 tab by mouth at bedtime x 1 week then stop  Problem # 5:  DIABETES MELLITUS, TYPE II (ICD-250.00) Assessment: Unchanged Will restart a low dose Benicar in the future for  renal protection and diabetes when his pressure increases and stabilizes. He will let me know. The following medications were removed from the medication list:    Benicar 5 Mg Tabs (Olmesartan medoxomil) ..... Once daily His updated medication list for this problem includes:    Glipizide Xl 10 Mg Xr24h-tab (Glipizide) .Marland Kitchen..Marland Kitchen Two tabs once daily    Actos 30 Mg Tabs (Pioglitazone hcl) ..... Once daily    Aspirin Ec 325 Mg Tbec (Aspirin) .Marland Kitchen... Take one tablet by mouth daily    Janumet 50-500 Mg Tabs (Sitagliptin-metformin hcl) .Marland Kitchen... 1 tab two times a day  Patient Instructions: 1)  Decrease metoprolol to 12.5mg  at bedtime x 1 week, then stop. 2)  Your physician recommends that you schedule a follow-up appointment in: 3 months.

## 2011-01-19 NOTE — Miscellaneous (Signed)
Summary: Orders Update  Clinical Lists Changes  Orders: Added new Test order of TLB-A1C / Hgb A1C (Glycohemoglobin) (83036-A1C) - Signed 

## 2011-01-19 NOTE — Letter (Signed)
Summary: [facility]  [facility]   Imported By: Marylou Mccoy 11/17/2010 15:36:48  _____________________________________________________________________  External Attachment:    Type:   Image     Comment:   External Document

## 2011-01-19 NOTE — Progress Notes (Signed)
Summary: refill  Phone Note Refill Request Message from:  Fax from Pharmacy on November 20, 2010 5:17 PM  Refills Requested: Medication #1:  NU-IRON 150 MG CAPS 1 cap once daily Initial call taken by: Kern Reap CMA Duncan Dull),  November 20, 2010 5:17 PM    Prescriptions: NU-IRON 150 MG CAPS (POLYSACCHARIDE IRON COMPLEX) 1 cap once daily  #90 x 3   Entered by:   Kern Reap CMA (AAMA)   Authorized by:   Nelwyn Salisbury MD   Signed by:   Kern Reap CMA (AAMA) on 11/20/2010   Method used:   Electronically to        [facility] [phone]* (retail)       [address] [place], [place]  [address]       Ph: [phone] or [phone]       Fax: [phone]   RxID:   [id number]

## 2011-01-19 NOTE — Letter (Signed)
Summary: [facility]  [facility]   Imported By: Maryln Gottron 04/27/2010 15:41:39  _____________________________________________________________________  External Attachment:    Type:   Image     Comment:   External Document

## 2011-01-19 NOTE — Progress Notes (Signed)
Summary: [employer]-Prior Auth for Actos 30mg  Approved through 10/01/2012  Phone Note Other Incoming   Caller: [employer] - Prior Authorization Summary of Call: Rcvd a approval letter from [place], stating that pts prior authorization for Actos 30mg  Tab has been approved through 10/01/2012. [facility] has been notifed about the approval and script is being filled for the pt.     Initial call taken by: Lucy Antigua,  October 02, 2010 3:31 PM  Follow-up for Phone Call        noted  Follow-up by: Nelwyn Salisbury MD,  October 02, 2010 4:14 PM

## 2011-01-19 NOTE — Miscellaneous (Signed)
  Clinical Lists Changes  Observations: Added new observation of CARDCATHFIND: 1. Normal right heart pressures. 2. Evidence of severe aortic stenosis by echocardiography. 3. Heavily calcified coronary arteries with an 80% focal eccentric LAD     stenosis and a 60-70% proximal focal RCA stenosis.   DISPOSITION:  I will have the patient follow up with Dr. Daleen Squibb sometime next week.  He will need possibly aortic valve replacement and revascularization surgery.  He is not symptomatic at the present time.     (06/19/2010 9:22) Added new observation of US CAROTID: Mild, smooth stable plaque in both carotid bulbs 0-39% bilateral ICA stenosis (03/13/2010 9:22) Added new observation of ECHOINTERP:   - Left ventricle: The cavity size was normal. Rashon Westrup thickness was       increased in a pattern of mild LVH. There was mild focal basal       hypertrophy of the septum. Systolic function was normal. The       estimated ejection fraction was in the range of 60% to 65%. Jakyra Kenealy       motion was normal; there were no regional Che Below motion       abnormalities. Doppler parameters are consistent with abnormal       left ventricular relaxation (grade 1 diastolic dysfunction).     - Aortic valve: Valve mobility was restricted. There was severe       stenosis. Trivial regurgitation.     - Mitral valve: Calcified annulus.     - Pulmonary arteries: Systolic pressure was mildly increased. (03/13/2010 9:21)      Echocardiogram  Procedure date:  03/13/2010  Findings:        - Left ventricle: The cavity size was normal. Mackinsey Pelland thickness was       increased in a pattern of mild LVH. There was mild focal basal       hypertrophy of the septum. Systolic function was normal. The       estimated ejection fraction was in the range of 60% to 65%. Bayyinah Dukeman       motion was normal; there were no regional Edrik Rundle motion       abnormalities. Doppler parameters are consistent with abnormal       left ventricular relaxation (grade 1  diastolic dysfunction).     - Aortic valve: Valve mobility was restricted. There was severe       stenosis. Trivial regurgitation.     - Mitral valve: Calcified annulus.     - Pulmonary arteries: Systolic pressure was mildly increased.  Carotid Doppler  Procedure date:  03/13/2010  Findings:      Mild, smooth stable plaque in both carotid bulbs 0-39% bilateral ICA stenosis  Cardiac Cath  Procedure date:  06/19/2010  Findings:      1. Normal right heart pressures. 2. Evidence of severe aortic stenosis by echocardiography. 3. Heavily calcified coronary arteries with an 80% focal eccentric LAD     stenosis and a 60-70% proximal focal RCA stenosis.   DISPOSITION:  I will have the patient follow up with Dr. Daleen Squibb sometime next week.  He will need possibly aortic valve replacement and revascularization surgery.  He is not symptomatic at the present time.

## 2011-01-19 NOTE — Progress Notes (Signed)
Summary: rx lotrisone cream   Phone Note Call from Patient   Caller: Patient Summary of Call: refill lotrisone cream gen. to be called to [facility].  Initial call taken by: Pura Spice, RN,  December 25, 2009 2:52 PM  Follow-up for Phone Call        called  Follow-up by: Pura Spice, RN,  December 25, 2009 2:54 PM    New/Updated Medications: LOTRISONE 1-0.05 % CREA (CLOTRIMAZOLE-BETAMETHASONE) apply two times a day as directed Prescriptions: LOTRISONE 1-0.05 % CREA (CLOTRIMAZOLE-BETAMETHASONE) apply two times a day as directed  #45 grams x 2   Entered by:   Pura Spice, RN   Authorized by:   Nelwyn Salisbury MD   Signed by:   Pura Spice, RN on 12/25/2009   Method used:   Telephoned to ...       [facility] [phone]* (retail)       [address] [place], [place]  [address]       Ph: [phone] or [phone]       Fax: [phone]   RxID:   [id number]

## 2011-01-19 NOTE — Letter (Signed)
Summary: [facility]  [facility]   Imported By: Maryln Gottron 10/22/2010 09:45:22  _____________________________________________________________________  External Attachment:    Type:   Image     Comment:   External Document

## 2011-01-19 NOTE — Medication Information (Signed)
Summary: [facility]   Imported By: Marylou Mccoy 10/20/2010 14:05:51  _____________________________________________________________________  External Attachment:    Type:   Image     Comment:   External Document

## 2011-01-19 NOTE — Letter (Signed)
Summary: Lipid Results /[facility]   Lipid Results [facility]   Imported By: Maryln Gottron 09/30/2010 12:34:18  _____________________________________________________________________  External Attachment:    Type:   Image     Comment:   External Document

## 2011-01-19 NOTE — Miscellaneous (Signed)
Summary: [facility]   [facility]   Imported By: Roderic Ovens 10/26/2010 09:15:27  _____________________________________________________________________  External Attachment:    Type:   Image     Comment:   External Document

## 2011-01-19 NOTE — Medication Information (Signed)
Summary: Actos Approved  Actos Approved   Imported By: Maryln Gottron 10/07/2010 11:17:17  _____________________________________________________________________  External Attachment:    Type:   Image     Comment:   External Document

## 2011-01-19 NOTE — Progress Notes (Signed)
Summary: rx DM supplies   Phone Note Call from Patient   Caller: Patient Summary of Call: pls call in actos 30 mg and lancents and strips for accu ck avia  to [facility] Initial call taken by: Pura Spice, RN,  September 29, 2010 1:15 PM  Follow-up for Phone Call        call in one year supply of these Follow-up by: Nelwyn Salisbury MD,  September 29, 2010 1:18 PM  Additional Follow-up for Phone Call Additional follow up Details #1::        done Additional Follow-up by: Pura Spice, RN,  September 29, 2010 1:21 PM    New/Updated Medications: ACCU-CHEK SOFT TOUCH LANCETS  MISC (LANCETS) use as directed ACCU-CHEK AVIVA  STRP (GLUCOSE BLOOD) test as directed Prescriptions: ACCU-CHEK AVIVA  STRP (GLUCOSE BLOOD) test as directed  #100 x 3   Entered by:   Pura Spice, RN   Authorized by:   Nelwyn Salisbury MD   Signed by:   Pura Spice, RN on 09/29/2010   Method used:   Electronically to        [facility] [phone]* (retail)       [address] [place], [place]  [address]       Ph: [phone] or [phone]       Fax: [phone]   RxID:   [id number] ACCU-CHEK SOFT TOUCH LANCETS  MISC (LANCETS) use as directed  #100 x 3   Entered by:   Pura Spice, RN   Authorized by:   Nelwyn Salisbury MD   Signed by:   Pura Spice, RN on 09/29/2010   Method used:   Electronically to        [facility] [phone]* (retail)       [address] [place], [place]  [address]       Ph: [phone] or [phone]       Fax: [phone]   RxID:   [id number]

## 2011-01-19 NOTE — Letter (Signed)
Summary: [facility] Office Visit Addendum   [facility] Office Visit Addendum   Imported By: Roderic Ovens 09/04/2010 16:09:47  _____________________________________________________________________  External Attachment:    Type:   Image     Comment:   External Document

## 2011-01-19 NOTE — Consult Note (Signed)
Summary: [facility]  [facility]   Imported By: Maryln Gottron 07/16/2010 12:48:35  _____________________________________________________________________  External Attachment:    Type:   Image     Comment:   External Document

## 2011-01-19 NOTE — Letter (Signed)
Summary: Cardiac Catheterization Instructions- Main Lab  [employer], Main Office  [address]   [place], [place] [address]   Phone: [phone]  Fax: [phone]     06/09/2010 Stanley [name]  Stanley [name] [address] [place], [place]  [address]  Dear Mr. Stanley [name],   You are scheduled for Cardiac Catheterization on      06/19/10         with Dr.  Riley Kill          .  Please arrive at the [facility] at  7:00     a.m./p.m. on the day of your procedure.  1. DIET     __X__ Nothing to eat or drink after midnight except your medications with a sip of water.  2. Come to the [place] office on             for lab work.  The lab at [facility] is open from 8:30 a.m. to 1:30 p.m. and 2:30 p.m. to 5:00 p.m.  The lab at [facility] is open from 7:30 a.m. to 5:30 p.m.  You do not have to be fasting.  3. MAKE SURE YOU TAKE YOUR ASPIRIN.  4. __X___ DO NOT TAKE these medications before your procedure:         HOLD GLIPIZIDE 10 MG AM OF CATH ACTOS 30 MG AM OF CATH AND JANUMET 50 /500 24 HOURS BEFORE CATH AND 48 HOURS AFTER CATH ________________________________________________________________________________      __X__ Stanley [name] MAY TAKE ALL of your remaining medications with a small amount of water.      ____ START NEW medications:     ________________________________________________________________________________      ____ Stanley [name] instructions:     ________________________________________________________________________________  5. Plan for one night stay - bring personal belongings (i.e. toothpaste, toothbrush, etc.)  6. Bring a current list of your medications and current insurance cards.  7. Must have a responsible person to drive you home.   8. Someone must be with yu for the first 24 hours after you arrive home.  9. Please wear clothes that are easy to get on and off and wear slip-on shoes.  *Special note:  Every effort is made to have your procedure done on time.  Occasionally there are emergencies that present themselves at the hospital that may cause delays.  Please be patient if a delay does occur.  If you have any questions after you get home, please call the office at the number listed above.  DR TOM WALL/ Scherrie Bateman, LPN  Appended Document: Cardiac Catheterization Instructions- Main Lab  Reviewed Juanito Doom, MD

## 2011-01-19 NOTE — Assessment & Plan Note (Signed)
Summary: eph/ appt is 1:.30/ per tw/ gd   Visit Type:  EPH Primary Provider:  Nelwyn Salisbury MD  CC:  pt c/o chest wall pain....denies any other complaints today.  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 At that time he had a pericardial tissue graft and left internal mammary graft to LAD vein graft to the posterior descending artery.  Postoperatively, he had thrombocytopenia down to 117,000, his sodium dropped to 123 and he also had a urinalysis that showed a small number of leukocytes and blood. He was treated with ciprofloxacin. His blood count dropped down to 9.4. He did not require any postop transfusions.  Since discharge, his strength is gradually improving. He feels like his imbalance is better. His blood pressure and running around 110 but today is running 92/60. He denies any presyncope or syncope.  His appointment Dr. Nydia Bouton on September 26.  He's a little bit of edema in his feet but no orthopnea PND. His weight still up about 4 pounds pre-surgery.  Clinical Reports Reviewed:  CXR:  08/20/2010: CXR Results:   Clinical Data: Aortic stenosis and coronary artery disease.  Postop   from bypass grafting and aortic valve replacement and    CHEST - 2 VIEW    Comparison: 08/19/2010    Findings: Left chest tube is seen and and no pneumothorax   identified.  Decreased bibasilar atelectasis is noted.  Mild   cardiomegaly stable.    IMPRESSION:    1.  No evidence of pneumothorax following chest tube removal.   2.  Decreased bibasilar atelectasis.    Read By:  Danae Orleans,  M.D.   Released By:  Danae Orleans,  M.D.  08/19/2010: CXR Results:    Clinical Data: CABG and aortic valve replacement, follow-up    PORTABLE CHEST - 1 VIEW    Comparison: Portable chest x-ray of 08/18/2010    Findings: The Swan-Ganz catheter has been removed with the venous   sheath  remaining in the SVC.  No pneumothorax is seen.  Left chest   tube remains.  The lungs appear slightly better aerated with mild   linear atelectasis at the lung bases remain.  Cardiomegaly is   stable.  Median sternotomy sutures are noted    IMPRESSION:   Slightly better aeration with mild basilar linear atelectasis.   Swan-Ganz catheter removed.  No pneumothorax.    Read By:  Juline Patch,  M.D.   Released By:  Juline Patch,  M.D.  08/18/2010: CXR Results:    Clinical Data: CABG    PORTABLE CHEST - 1 VIEW    Comparison: Chest radiograph 08/17/2010    Findings: Interval extubation.  Swan-Ganz catheter, mediastinal   drain, and left chest tube are unchanged.  Stable mediastinum and   heart silhouette.  There is increased atelectasis in the right   upper lobe.  Left lung is relatively clear.    IMPRESSION:    Extubation with increased atelectasis in the right upper lobe.    Read By:  Genevive Bi,  M.D.   Released By:  Genevive Bi,  M.D.  06/06/2009: CXR Results:    Clinical Data: Cough.    CHEST - 2 VIEW    Comparison: None    Findings:  The heart size and mediastinal contours are within   normal limits.  Both lungs are clear.  The visualized skeletal  structures are unremarkable.    IMPRESSION:   No active cardiopulmonary disease.    Read By:  Jonne Ply,  M.D.   Released By:  Jonne Ply,  M.D.  11/04/2000: CXR Results:   REPORT:  CLINICAL DATA:     THE PATIENT IS PRE-OP.   CHEST 2 VIEW - 11/04/00:  PA AND LATERAL VIEWS OF THE CHEST WITH COMPARISON FILMS OF   MAY 1999 REVEAL THE HEART SIZE TO BE NORMAL WITH MILD   DILATATION OF THE AORTA.  THE LUNG FIELDS ARE CLEAR.  MILD   SPURRING IS PRESENT.   IMPRESSION:  NO ACTIVE DISEASE.   TRANSCRIBED DATE:  MFM,JDC  RKL                                           Read ZO:XWRU A   Young, M.D.  11/04/2000 17:28                                           Released EA:VWUJ   A. Maple Hudson,  M.D.  Cardiac Cath:  06/20/2010: Cardiac Cath Findings:  The right coronary artery is ectatic, and is heavily calcified.       There is a focal 60-70% lesion proximally with a fair amount of       ectasia throughout the midvessel with 30% narrowing.  The PDA is       really quite small, with about 70% ostial, 70% mid-narrowing and       there is about 40-50% posterolateral area of narrowing as well.      CONCLUSIONS:   1. Normal right heart pressures.   2. Evidence of severe aortic stenosis by echocardiography.   3. Heavily calcified coronary arteries with an 80% focal eccentric LAD       stenosis and a 60-70% proximal focal RCA stenosis.      DISPOSITION:  I will have the patient follow up with Dr. Daleen Squibb sometime   next week.  He will need possibly aortic valve replacement and   revascularization surgery.  He is not symptomatic at the present time.               Arturo Morton. Riley Kill, MD, [facility]         06/19/2010: Cardiac Cath Findings:  1. Normal right heart pressures. 2. Evidence of severe aortic stenosis by echocardiography. 3. Heavily calcified coronary arteries with an 80% focal eccentric LAD     stenosis and a 60-70% proximal focal RCA stenosis.   DISPOSITION:  I will have the patient follow up with Dr. Daleen Squibb sometime next week.  He will need possibly aortic valve replacement and revascularization surgery.  He is not symptomatic at the present time.      Carotid Doppler:  08/13/2010:  Summary:    - Lower extremities - ABIs elevated, probably due to diabetic     calcification of arterial walls. Evidence of moderate to severe     peripheral arterial disease in both legs based on waveform     analysis.      Upper extremities - Palmar Arch diminished 50% with ulnar     compression bilaterally. Otherwise normal study.   - Carotid Duplex - No significant extracranial carotid artery     stenosis demonstrated.  Vertebrals are patent with antegrade flow.   Prepared and  Electronically Authenticated by  03/13/2010:  Impressions: Mild, smooth stable plaque in the carotid bulbs. 0-39% bilateral ICA stenosis.  Charlton Haws, MD  03/13/2010:  Mild, smooth stable plaque in both carotid bulbs 0-39% bilateral ICA stenosis  Nuclear Study:  11/07/2006:  Excerise capacity: Adenosine with low level exercise  Blood Pressure response:  Clinical symptoms:  ECG impression: No significant ST segment change suggestive of ischemia  Overall impression: There is no sign of scar or ischemia   Olga Millers, MD   Current Medications (verified): 1)  Glipizide Xl 10 Mg Xr24h-Tab (Glipizide) .... Two Tabs Once Daily 2)  Diclofenac Sodium 75 Mg Tbec (Diclofenac Sodium) .Marland Kitchen.. 1 By Mouth Two Times A Day After Meals 3)  Flomax 0.4 Mg Caps (Tamsulosin Hcl) .... Once Daily 4)  Actos 30 Mg Tabs (Pioglitazone Hcl) .... Once Daily 5)  Crestor 10 Mg Tabs (Rosuvastatin Calcium) .... Once Daily 6)  Zetia 10 Mg Tabs (Ezetimibe) .... Once Daily 7)  Avodart 0.5 Mg Caps (Dutasteride) .... Once Daily 8)  Aspirin Ec 325 Mg Tbec (Aspirin) .... Take One Tablet By Mouth Daily 9)  Janumet 50-500 Mg Tabs (Sitagliptin-Metformin Hcl) .Marland Kitchen.. 1 Tab Two Times A Day 10)  Lotrisone 1-0.05 % Crea (Clotrimazole-Betamethasone) .... Apply Two Times A Day As Directed 11)  Calcium-Vitamin D 500-200 Mg-Unit Tabs (Calcium Carbonate-Vitamin D) .Marland Kitchen.. 1 Tab Once Daily 12)  Cpap .... At Bedtime 13)  Valtrex 500 Mg Tabs (Valacyclovir Hcl) .... Three Times A Day 14)  Folic Acid 1 Mg Tabs (Folic Acid) .Marland Kitchen.. 1 Tab Once Daily 15)  Furosemide 20 Mg Tabs (Furosemide) .Marland Kitchen.. 1 Tab Once Daily 16)  Mucinex 600 Mg Xr12h-Tab (Guaifenesin) .Marland Kitchen.. 1 Tab As Needed 17)  Nu-Iron 150 Mg Caps (Polysaccharide Iron Complex) .Marland Kitchen.. 1 Cap Once Daily 18)  Metoprolol Tartrate 25 Mg Tabs (Metoprolol Tartrate) .... 1/2 Tab Two Times A Day 19)  Doxycycline Hyclate 100 Mg Caps (Doxycycline Hyclate) .Marland Kitchen.. 1 Cap Two Times A Day 20)  Enablex 15  Mg Xr24h-Tab (Darifenacin Hydrobromide) .Marland Kitchen.. 1 Tab Qam 21)  Oxycodone-Acetaminophen 5-500 Mg Caps (Oxycodone-Acetaminophen) .Marland Kitchen.. 1-2 Tabs Once Daily As Needed  Allergies: 1)  ! Ace Inhibitors  Vital Signs:  Patient profile:   77 year old male Height:      68 inches Weight:      196.8 pounds BMI:     30.03 Pulse rate:   71 / minute Pulse rhythm:   irregular BP sitting:   92 / 60  (left arm) Cuff size:   large  Vitals Entered By: Danielle Rankin, CMA (September 04, 2010 2:27 PM)  Physical Exam  General:  Well developed, well nourished, in no acute distress. Head:  normocephalic and atraumatic Eyes:  PERRLA/EOM intact; conjunctiva and lids normal. Neck:  Neck supple, no JVD. No masses, thyromegaly or abnormal cervical nodes. Chest Wall:  median sternotomy healing well Lungs:  Clear bilaterally to auscultation and percussion. Heart:  Nondisplaced, regular rate and rhythm, normal S1 with a split S2. Soft systolic murmur along the sternal border. Carotids equal bilaterally without bruits Msk:  decreased ROM.   Pulses:  pulses normal in all 4 extremities Extremities:  trace left pedal edema and trace right pedal edema.   Neurologic:  Alert and oriented x 3. Skin:  Intact without lesions or rashes. Psych:  Normal affect.   Problems:  Medical Problems Added: 1)  Dx of Second Degree Av Heart Block, Mobitz I  (  [id number])  EKG  Procedure date:  09/04/2010  Findings:      normal sinus rhythm, second degree type I AV block  Impression & Recommendations:  Problem # 1:  AORTIC VALVE REPLACEMENT, HX OF (ICD-V43.3) Assessment Unchanged  Orders: Cardiac Rehabilitation (Cardiac Rehab) EKG w/ Interpretation (93[address]) T-Culture, Urine (40347-42595) T-Hemoglobin (63875-64332)  Problem # 2:  CAD, NATIVE VESSEL (ICD-414.01)  His updated medication list for this problem includes:    Aspirin Ec 325 Mg Tbec (Aspirin) .Marland Kitchen... Take one tablet by mouth daily    Metoprolol Tartrate 25 Mg  Tabs (Metoprolol tartrate) .Marland Kitchen... 1/2 tab two times a day  Orders: Cardiac Rehabilitation (Cardiac Rehab) EKG w/ Interpretation (93000) T-Culture, Urine ([id number]) T-Hemoglobin ([id number])  Problem # 3:  CAROTID ARTERY DISEASE (ICD-433.10) Assessment: Unchanged  His updated medication list for this problem includes:    Aspirin Ec 325 Mg Tbec (Aspirin) .Marland Kitchen... Take one tablet by mouth daily  Problem # 4:  AORTIC STENOSIS (ICD-424.1) Assessment: Improved  The following medications were removed from the medication list:    Benicar 20 Mg Tabs (Olmesartan medoxomil) ..... Once daily His updated medication list for this problem includes:    Furosemide 20 Mg Tabs (Furosemide) .Marland Kitchen... 1 tab once daily    Metoprolol Tartrate 25 Mg Tabs (Metoprolol tartrate) .Marland Kitchen... 1/2 tab two times a day  Problem # 5:  HYPERTENSION (ICD-401.9) Assessment: Improved I will hold on the Benicar until he is couple weeks and rehabilitation. When his pressures running consistent around 110 120 we'll add back. The following medications were removed from the medication list:    Benicar 20 Mg Tabs (Olmesartan medoxomil) ..... Once daily His updated medication list for this problem includes:    Aspirin Ec 325 Mg Tbec (Aspirin) .Marland Kitchen... Take one tablet by mouth daily    Furosemide 20 Mg Tabs (Furosemide) .Marland Kitchen... 1 tab once daily    Metoprolol Tartrate 25 Mg Tabs (Metoprolol tartrate) .Marland Kitchen... 1/2 tab two times a day  Problem # 6:  HYPERLIPIDEMIA (ICD-272.4)  His updated medication list for this problem includes:    Crestor 10 Mg Tabs (Rosuvastatin calcium) ..... Once daily    Zetia 10 Mg Tabs (Ezetimibe) ..... Once daily  Problem # 7:  SECOND DEGREE AV HEART BLOCK, MOBITZ I (ICD-426.13) Assessment: New He is asymptomatic. Continue low-dose beta blocker for now. His updated medication list for this problem includes:    Aspirin Ec 325 Mg Tbec (Aspirin) .Marland Kitchen... Take one tablet by mouth daily    Metoprolol Tartrate 25 Mg  Tabs (Metoprolol tartrate) .Marland Kitchen... 1/2 tab two times a day  Patient Instructions: 1)  Your physician recommends that you schedule a follow-up appointment in: 6-8 weeks with Dr. Daleen Squibb 2)  Your physician recommends that you have  lab work today: Hemoglobin, urine culture 3)  Your physician recommends referral and attendance at a Cardiac Rehab Program. Prescriptions: OXYCODONE-ACETAMINOPHEN 5-500 MG CAPS (OXYCODONE-ACETAMINOPHEN) 1-2 tabs once daily as needed  #30 x 0   Entered by:   Danielle Rankin, CMA   Authorized by:   Gaylord Shih, MD, [facility]   Signed by:   Danielle Rankin, CMA on 09/04/2010   Method used:   Print then Give to Patient   RxID:   [id number] FUROSEMIDE 20 MG TABS (FUROSEMIDE) 1 tab once daily  #90 x 3   Entered by:   Danielle Rankin, CMA   Authorized by:   Gaylord Shih, MD, [facility]   Signed by:   Danielle Rankin, CMA on 09/04/2010  Method used:   Electronically to        [facility] [phone]* (retail)       [address] [place], [place]  [address]       Ph: [phone] or [phone]       Fax: [phone]   RxID:   [id number]

## 2011-01-19 NOTE — Assessment & Plan Note (Signed)
Summary: eph/post cath per dr Riley Kill   Visit Type:  rov Primary Provider:  Nelwyn Salisbury MD   History of Present Illness: Stanley [name] Stable returns today for followup after having cardiac catheterization on July 1. Please see that report.  He has severe aortic stenosis by echocardiography, and two-vessel coronary disease involving the LAD and right coronary vessel. He needs elective aortic valve her placement and revascularization surgery. He is asymptomatic.  Current Medications (verified): 1)  Prochlorperazine Maleate 10 Mg Tabs (Prochlorperazine Maleate) .Marland Kitchen.. 1 Every 4-6 Hrs As Needed Nausea Vomiting 2)  Glipizide Xl 10 Mg Xr24h-Tab (Glipizide) .... Two Tabs Once Daily 3)  Diclofenac Sodium 75 Mg Tbec (Diclofenac Sodium) .Marland Kitchen.. 1 By Mouth Two Times A Day After Meals 4)  Flomax 0.4 Mg Caps (Tamsulosin Hcl) .... Once Daily 5)  Actos 30 Mg Tabs (Pioglitazone Hcl) .... Once Daily 6)  Crestor 10 Mg Tabs (Rosuvastatin Calcium) .... Once Daily 7)  Zetia 10 Mg Tabs (Ezetimibe) .... Once Daily 8)  Avodart 0.5 Mg Caps (Dutasteride) .... Once Daily 9)  Aspir-Low 81 Mg Tbec (Aspirin) .... Once Daily 10)  Benicar 20 Mg Tabs (Olmesartan Medoxomil) .... Once Daily 11)  Janumet 50-500 Mg Tabs (Sitagliptin-Metformin Hcl) .Marland Kitchen.. 1 Tab Once Daily 12)  Lotrisone 1-0.05 % Crea (Clotrimazole-Betamethasone) .... Apply Two Times A Day As Directed 13)  Calcium-Vitamin D 500-200 Mg-Unit Tabs (Calcium Carbonate-Vitamin D) .Marland Kitchen.. 1 Tab Once Daily 14)  Cpap .... At Bedtime 15)  Valtrex 500 Mg Tabs (Valacyclovir Hcl) .... Three Times A Day  Allergies: 1)  ! Ace Inhibitors  Past History:  Past Medical History: Last updated: 02/28/10 CAROTID ARTERY DISEASE (ICD-433.10) AORTIC STENOSIS (ICD-424.1) HYPERTENSION (ICD-401.9) HYPERLIPIDEMIA (ICD-272.4) DIABETES MELLITUS, TYPE II (ICD-250.00) OBSTRUCTIVE SLEEP APNEA (ICD-327.23) ELEVATED PROSTATE SPECIFIC ANTIGEN (ICD-790.93) HYPOTHYROIDISM (ICD-244.9) ANEMIA  (ICD-285.9) UNS ADVRS EFF OTH RX MEDICINAL&BIOLOGICAL SBSTNC (ONG-295.28) UTI (ICD-599.0)  Past Surgical History: Last updated: 2010-02-28 bilateral knee replacments Tonsillectomy Prostate surgery  Family History: Last updated: 2010/02/28 Father: deceased @ 39 due to blood clots  Mother: deceased @ 110 due to lymphoma  Social History: Last updated: 02/28/10 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  quit 1980 Alcohol Use - yes Drug Use - no  Risk Factors: Smoking Status: quit (02-28-2010)  Review of Systems       negative other than history of present illness  Vital Signs:  Patient profile:   77 year old male Height:      68 inches Weight:      201 pounds BMI:     30.67 Pulse rate:   72 / minute Pulse rhythm:   regular BP sitting:   112 / 60  (left arm) Cuff size:   large  Vitals Entered By: Danielle Rankin, CMA (June 24, 2010 9:26 AM)  Physical Exam  General:  Well developed, well nourished, in no acute distress. Head:  normocephalic and atraumatic Neck:  Neck supple, no JVD. No masses, thyromegaly or abnormal cervical nodes. Lungs:  Clear bilaterally to auscultation and percussion. Heart:  regular rate and rhythm, aortic stenosis murmur, carotid bruits Msk:  decreased ROM.  decreased ROM.   Pulses:  pulses normal in all 4 extremities Extremities:  No clubbing or cyanosis. Neurologic:  Alert and oriented x 3. Skin:  Intact without lesions or rashes. Psych:  Normal affect.   Problems:  Medical Problems Added: 1)  Dx of Cad, Native Vessel  (ICD-414.01)  Impression & Recommendations:  Problem # 1:  AORTIC STENOSIS (ICD-424.1) Needs  elective replacement. I would favor a tissue valve. His updated medication list for this problem includes:    Benicar 20 Mg Tabs (Olmesartan medoxomil) ..... Once daily  Problem # 2:  CAD, NATIVE VESSEL (ICD-414.01) Assessment: New  His updated medication list for this problem includes:    Aspir-low 81 Mg  Tbec (Aspirin) ..... Once daily  Problem # 3:  CAROTID ARTERY DISEASE (ICD-433.10) Assessment: Unchanged  His updated medication list for this problem includes:    Aspir-low 81 Mg Tbec (Aspirin) ..... Once daily  Problem # 4:  HYPERTENSION (ICD-401.9) Assessment: Unchanged  His updated medication list for this problem includes:    Aspir-low 81 Mg Tbec (Aspirin) ..... Once daily    Benicar 20 Mg Tabs (Olmesartan medoxomil) ..... Once daily  Problem # 5:  DIABETES MELLITUS, TYPE II (ICD-250.00) Assessment: Unchanged  His updated medication list for this problem includes:    Glipizide Xl 10 Mg Xr24h-tab (Glipizide) .Marland Kitchen..Marland Kitchen Two tabs once daily    Actos 30 Mg Tabs (Pioglitazone hcl) ..... Once daily    Aspir-low 81 Mg Tbec (Aspirin) ..... Once daily    Benicar 20 Mg Tabs (Olmesartan medoxomil) ..... Once daily    Janumet 50-500 Mg Tabs (Sitagliptin-metformin hcl) .Marland Kitchen... 1 tab once daily  Problem # 6:  HYPERLIPIDEMIA (ICD-272.4) Assessment: Unchanged  His updated medication list for this problem includes:    Crestor 10 Mg Tabs (Rosuvastatin calcium) ..... Once daily    Zetia 10 Mg Tabs (Ezetimibe) ..... Once daily  Problem # 7:  OBSTRUCTIVE SLEEP APNEA (ICD-327.23) Assessment: Unchanged  Problem # 8:  HYPOTHYROIDISM (ICD-244.9) Assessment: Unchanged  Patient Instructions: 1)  Your physician recommends that you schedule a follow-up appointment in:  to be arranged 2)  Your physician recommends that you continue on your current medications as directed. Please refer to the Current Medication list given to you today. 3)  You have been referred to call to DR [facility]

## 2011-01-19 NOTE — Letter (Signed)
Summary: [facility] - [facility]iac [facility]hab [facility]  [facility]   Imported By: Marylou Mccoy 09/29/2010 08:52:59  _____________________________________________________________________  External Attachment:    Type:   Image     Comment:   External Document

## 2011-01-19 NOTE — Miscellaneous (Signed)
Summary: [facility] Physician Order/Treatment Plan   [facility] Physician Order/Treatment Plan   Imported By: Roderic Ovens 09/08/2010 15:32:00  _____________________________________________________________________  External Attachment:    Type:   Image     Comment:   External Document

## 2011-01-19 NOTE — Progress Notes (Signed)
Summary: rx compazine   Phone Note Call from Patient   Caller: Patient Summary of Call: would like to have compazine 10 mg  #60 called to [facility]  Initial call taken by: Pura Spice, RN,  November 10, 2010 3:09 PM  Follow-up for Phone Call        he may use this q 6 hours as needed nausea, call in #60 with 2 rf  Follow-up by: Nelwyn Salisbury MD,  November 10, 2010 3:54 PM  Additional Follow-up for Phone Call Additional follow up Details #1::        done  Additional Follow-up by: Pura Spice, RN,  November 10, 2010 3:55 PM    New/Updated Medications: PROCHLORPERAZINE MALEATE 10 MG TABS (PROCHLORPERAZINE MALEATE) 1 by mouth every 6 hrs as needed nausea Prescriptions: PROCHLORPERAZINE MALEATE 10 MG TABS (PROCHLORPERAZINE MALEATE) 1 by mouth every 6 hrs as needed nausea  #60 x 2   Entered by:   Pura Spice, RN   Authorized by:   Nelwyn Salisbury MD   Signed by:   Pura Spice, RN on 11/10/2010   Method used:   Electronically to        [facility] [phone]* (retail)       [address] [place], [place]  [address]       Ph: [phone] or [phone]       Fax: [phone]   RxID:   [id number]

## 2011-01-19 NOTE — Letter (Signed)
Summary: LMN for CPAP Supplies/[facility]  LMN for CPAP Supplies/[facility]   Imported By: Sherian Rein 01/15/2010 09:26:29  _____________________________________________________________________  External Attachment:    Type:   Image     Comment:   External Document

## 2011-01-19 NOTE — Progress Notes (Signed)
Summary: referral   Phone Note From Other Clinic   Summary of Call: Per Dr Lowella Fairy ofc. Dr Tome Wilson needs to send a referral for Dr [name] to be seen . [phone] Initial call taken by: Edman Circle,  June 24, 2010 11:28 AM  Follow-up for Phone Call        FORM AND RECORDS FAXED TO DR [id number] OFFICE. Follow-up by: Scherrie Bateman, LPN,  June 24, 9146 2:19 PM

## 2011-01-19 NOTE — Assessment & Plan Note (Signed)
Summary: fup per gina//ccm   Vital Signs:  Patient profile:   77 year old male Weight:      196 pounds Temp:     98.0 degrees F oral BP sitting:   120 / 70  (left arm) Cuff size:   regular  Vitals Entered By: Kathrynn Speed CMA (September 17, 2010 11:07 AM) CC: fup per Almira Coaster, src Is Patient Diabetic? Yes   History of Present Illness: Here to follow up after an AVR and 2 vessel CABG on 08-18-10. He has done extremely well with no SOB or chest pain. He still gets fatigued very quickly of course. Dr. Daleen Squibb and Dr. Tyrone Sage seem pleased with his progress. he has no complaints today. His last Hgb has come up to 11.   Preventive Screening-Counseling & Management  Alcohol-Tobacco     Smoking Status: quit  Current Medications (verified): 1)  Glipizide Xl 10 Mg Xr24h-Tab (Glipizide) .... Two Tabs Once Daily 2)  Diclofenac Sodium 75 Mg Tbec (Diclofenac Sodium) .Marland Kitchen.. 1 By Mouth Two Times A Day After Meals 3)  Flomax 0.4 Mg Caps (Tamsulosin Hcl) .... Once Daily 4)  Actos 30 Mg Tabs (Pioglitazone Hcl) .... Once Daily 5)  Crestor 10 Mg Tabs (Rosuvastatin Calcium) .... Once Daily 6)  Zetia 10 Mg Tabs (Ezetimibe) .... Once Daily 7)  Avodart 0.5 Mg Caps (Dutasteride) .... Once Daily 8)  Aspirin Ec 325 Mg Tbec (Aspirin) .... Take One Tablet By Mouth Daily 9)  Janumet 50-500 Mg Tabs (Sitagliptin-Metformin Hcl) .Marland Kitchen.. 1 Tab Two Times A Day 10)  Calcium-Vitamin D 500-200 Mg-Unit Tabs (Calcium Carbonate-Vitamin D) .Marland Kitchen.. 1 Tab Once Daily 11)  Cpap .... At Bedtime 12)  Folic Acid 1 Mg Tabs (Folic Acid) .Marland Kitchen.. 1 Tab Once Daily 13)  Furosemide 20 Mg Tabs (Furosemide) .Marland Kitchen.. 1 Tab Once Daily As Needed 14)  Nu-Iron 150 Mg Caps (Polysaccharide Iron Complex) .Marland Kitchen.. 1 Cap Once Daily 15)  Metoprolol Tartrate 25 Mg Tabs (Metoprolol Tartrate) .... 1/2 Tab Two Times A Day 16)  Enablex 15 Mg Xr24h-Tab (Darifenacin Hydrobromide) .Marland Kitchen.. 1 Tab Qam 17)  Oxycodone-Acetaminophen 5-500 Mg Caps (Oxycodone-Acetaminophen) .Marland Kitchen.. 1-2 Tabs  Once Daily As Needed  Allergies (verified): 1)  ! Ace Inhibitors  Past History:  Past Medical History: CAROTID ARTERY DISEASE (ICD-433.10), sees Dr. Juanito Doom AORTIC STENOSIS (ICD-424.1) HYPERTENSION (ICD-401.9) HYPERLIPIDEMIA (ICD-272.4) DIABETES MELLITUS, TYPE II (ICD-250.00) OBSTRUCTIVE SLEEP APNEA (ICD-327.23) ELEVATED PROSTATE SPECIFIC ANTIGEN (ICD-790.93) HYPOTHYROIDISM (ICD-244.9) ANEMIA (ICD-285.9) UNS ADVRS EFF OTH RX MEDICINAL&BIOLOGICAL SBSTNC (ZOX-096.04) UTI (ICD-599.0)  Past Surgical History: bilateral knee replacements Tonsillectomy Prostate surgery  Aortic valve replacement  on 08-18-10 with a 25-mm Edwards   Lifescience pericardial tissue valve, model #3300 TFX, serial number   [id number] and coronary artery bypass grafting x2 with the left internal   mammary to the left anterior descending coronary artery and reversed   saphenous vein graft to the posterior descending coronary artery with   left thigh endovein harvesting.  SURGEON:  Sheliah Plane, MD      Review of Systems  The patient denies anorexia, fever, weight loss, weight gain, vision loss, decreased hearing, hoarseness, chest pain, syncope, dyspnea on exertion, peripheral edema, prolonged cough, headaches, hemoptysis, abdominal pain, melena, hematochezia, severe indigestion/heartburn, hematuria, incontinence, genital sores, muscle weakness, suspicious skin lesions, transient blindness, difficulty walking, depression, unusual weight change, abnormal bleeding, enlarged lymph nodes, angioedema, breast masses, and testicular masses.    Physical Exam  General:  Well-developed,well-nourished,in no acute distress; alert,appropriate and cooperative throughout examination Neck:  No deformities,  masses, or tenderness noted. Lungs:  Normal respiratory effort, chest expands symmetrically. Lungs are clear to auscultation, no crackles or wheezes. Heart:  Normal rate and regular rhythm. S1 and S2 normal without  gallop, murmur, click, rub or other extra sounds. Pulses:  R and L carotid,radial,femoral,dorsalis pedis and posterior tibial pulses are full and equal bilaterally Extremities:  no edema   Impression & Recommendations:  Problem # 1:  AORTIC VALVE REPLACEMENT, HX OF (ICD-V43.3)  Problem # 2:  CAROTID ARTERY DISEASE (ICD-433.10)  His updated medication list for this problem includes:    Aspirin Ec 325 Mg Tbec (Aspirin) .Marland Kitchen... Take one tablet by mouth daily  Problem # 3:  HYPERTENSION (ICD-401.9)  The following medications were removed from the medication list:    Furosemide 20 Mg Tabs (Furosemide) .Marland Kitchen... 1 tab once daily His updated medication list for this problem includes:    Metoprolol Tartrate 25 Mg Tabs (Metoprolol tartrate) .Marland Kitchen... 1/2 tab two times a day    Benicar 5 Mg Tabs (Olmesartan medoxomil) ..... Once daily  Problem # 4:  DIABETES MELLITUS, TYPE II (ICD-250.00)  His updated medication list for this problem includes:    Glipizide Xl 10 Mg Xr24h-tab (Glipizide) .Marland Kitchen..Marland Kitchen Two tabs once daily    Actos 30 Mg Tabs (Pioglitazone hcl) ..... Once daily    Aspirin Ec 325 Mg Tbec (Aspirin) .Marland Kitchen... Take one tablet by mouth daily    Janumet 50-500 Mg Tabs (Sitagliptin-metformin hcl) .Marland Kitchen... 1 tab two times a day    Benicar 5 Mg Tabs (Olmesartan medoxomil) ..... Once daily  Complete Medication List: 1)  Glipizide Xl 10 Mg Xr24h-tab (Glipizide) .... Two tabs once daily 2)  Diclofenac Sodium 75 Mg Tbec (Diclofenac sodium) .Marland Kitchen.. 1 by mouth two times a day after meals 3)  Flomax 0.4 Mg Caps (Tamsulosin hcl) .... Once daily 4)  Actos 30 Mg Tabs (Pioglitazone hcl) .... Once daily 5)  Crestor 10 Mg Tabs (Rosuvastatin calcium) .... Once daily 6)  Zetia 10 Mg Tabs (Ezetimibe) .... Once daily 7)  Avodart 0.5 Mg Caps (Dutasteride) .... Once daily 8)  Aspirin Ec 325 Mg Tbec (Aspirin) .... Take one tablet by mouth daily 9)  Janumet 50-500 Mg Tabs (Sitagliptin-metformin hcl) .Marland Kitchen.. 1 tab two times a  day 10)  Calcium-vitamin D 500-200 Mg-unit Tabs (Calcium carbonate-vitamin d) .Marland Kitchen.. 1 tab once daily 11)  Cpap  .... At bedtime 12)  Nu-iron 150 Mg Caps (Polysaccharide iron complex) .Marland Kitchen.. 1 cap once daily 13)  Metoprolol Tartrate 25 Mg Tabs (Metoprolol tartrate) .... 1/2 tab two times a day 14)  Enablex 15 Mg Xr24h-tab (Darifenacin hydrobromide) .Marland Kitchen.. 1 tab qam 15)  Oxycodone-acetaminophen 5-500 Mg Caps (Oxycodone-acetaminophen) .Marland Kitchen.. 1-2 tabs once daily as needed 16)  Benicar 5 Mg Tabs (Olmesartan medoxomil) .... Once daily 17)  Nexium 40 Mg Cpdr (Esomeprazole magnesium) .... Once daily  Other Orders: Flu Vaccine 48yrs + ([address]) Admin 1st Vaccine (60[address])  Patient Instructions: 1)  Please schedule a follow-up appointment in 1 month.  2)  He will start cardiac rehab soon. Prescriptions: NEXIUM 40 MG CPDR (ESOMEPRAZOLE MAGNESIUM) once daily  #30 x 11   Entered and Authorized by:   Nelwyn Salisbury MD   Signed by:   Nelwyn Salisbury MD on 09/17/2010   Method used:   Electronically to        [facility] [phone]* (retail)       [address] [place], [place]  [address]       Ph: [phone] or [phone]       Fax: [phone]   RxID:   [id number] NU-IRON 150 MG CAPS (POLYSACCHARIDE IRON COMPLEX) 1 cap once daily  #30 x 11   Entered and Authorized by:   Nelwyn Salisbury MD   Signed by:   Nelwyn Salisbury MD on 09/17/2010   Method used:   Electronically to        [facility] [address] [phone]* (retail)       [address] [place], [place]  [address]       Ph: [phone] or [phone]       Fax: [phone]   RxID:   [id number] DICLOFENAC SODIUM 75 MG TBEC (DICLOFENAC SODIUM) 1 by mouth two times a day after meals  #60 x 11   Entered and Authorized by:   Nelwyn Salisbury MD   Signed by:   Nelwyn Salisbury MD on 09/17/2010   Method used:   Electronically to        [facility] [address] [phone]* (retail)       [address] [place], [place]  [address]       Ph: [phone] or  [phone]       Fax: [phone]   RxID:   [id number] METOPROLOL TARTRATE 25 MG TABS (METOPROLOL TARTRATE) 1/2 tab two times a day  #30 x 11   Entered and Authorized by:   Nelwyn Salisbury MD   Signed by:   Nelwyn Salisbury MD on 09/17/2010   Method used:   Electronically to        [facility] [phone]* (retail)       [address] [place], [place]  [address]       Ph: [phone] or [phone]       Fax: [phone]   RxID:   [id number] BENICAR 5 MG TABS (OLMESARTAN MEDOXOMIL) once daily  #30 x 11   Entered and Authorized by:   Nelwyn Salisbury MD   Signed by:   Nelwyn Salisbury MD on 09/17/2010   Method used:   Electronically to        [facility] [phone]* (retail)       [address] [place], [place]  [address]       Ph: [phone] or [phone]       Fax: [phone]   RxID:   [id number]    Immunizations Administered:  Influenza Vaccine # 1:    Vaccine Type: Fluvax 3+    Site: left deltoid    Mfr: GlaxoSmithKline    Dose: 0.5 ml    Route: IM    Given by: Kathrynn Speed CMA    Exp. Date: 06/19/2011    Lot #: [id number]    VIS given: 07/14/10 version given September 17, 2010.  Flu Vaccine Consent Questions:    Do you have a history of severe allergic reactions to this vaccine? no    Any prior history of allergic reactions to egg and/or gelatin? no    Do you have a sensitivity to the preservative Thimersol? no    Do you have a past history of Guillan-Barre Syndrome? no    Do you currently have an acute febrile illness? no  Have you ever had a severe reaction to latex? no    Vaccine information given and explained to patient? yes

## 2011-01-21 NOTE — Letter (Signed)
Summary: [facility]  [facility]   Imported By: Sherian Rein 12/01/2010 07:46:34  _____________________________________________________________________  External Attachment:    Type:   Image     Comment:   External Document

## 2011-01-21 NOTE — Progress Notes (Signed)
Summary: refill on glipizide  Phone Note From Pharmacy   Caller: [facility]  [address] [phone]* Reason for Call: Needs renewal Details for Reason: glipizide xl 10mg  Initial call taken by: Romualdo Bolk, CMA Duncan Dull),  December 31, 2010 8:15 AM  Follow-up for Phone Call        done  Follow-up by: Pura Spice, RN,  December 31, 2010 1:07 PM    Prescriptions: GLIPIZIDE XL 10 MG XR24H-TAB (GLIPIZIDE) two tabs once daily  #180 x 1   Entered by:   Pura Spice, RN   Authorized by:   Nelwyn Salisbury MD   Signed by:   Pura Spice, RN on 12/31/2010   Method used:   Electronically to        [facility] [phone]* (retail)       [address] [place], [place]  [address]       Ph: [phone] or [phone]       Fax: [phone]   RxID:   [id number]

## 2011-01-21 NOTE — Letter (Signed)
Summary: [facility]  [facility]   Imported By: Marylou Mccoy 01/12/2011 13:49:36  _____________________________________________________________________  External Attachment:    Type:   Image     Comment:   External Document

## 2011-01-21 NOTE — Assessment & Plan Note (Signed)
Summary: 3 month rov.sl   Visit Type:  3 mo f/u Primary Provider:  Nelwyn Salisbury MD  CC:  pt denies any complaints today.  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  He's done remarkably well and is finished cardiac rehabilitation. His biggest problem is his legs and joints.  Denies orthopnea, PND or edema. His blood pressures, up and he said no further lightheadedness or dizziness since stopping the metoprolol. He is on no blood pressure meds including an ARB.  Denies any chest pain or angina. He denies palpitations or syncope.  Current Medications (verified): 1)  Glipizide Xl 10 Mg Xr24h-Tab (Glipizide) .... Two Tabs Once Daily 2)  Diclofenac Sodium 75 Mg Tbec (Diclofenac Sodium) .Marland Kitchen.. 1 By Mouth Two Times A Day After Meals 3)  Flomax 0.4 Mg Caps (Tamsulosin Hcl) .... Once Daily 4)  Actos 30 Mg Tabs (Pioglitazone Hcl) .... Once Daily 5)  Crestor 10 Mg Tabs (Rosuvastatin Calcium) .... Once Daily 6)  Zetia 10 Mg Tabs (Ezetimibe) .... Once Daily 7)  Avodart 0.5 Mg Caps (Dutasteride) .... Once Daily 8)  Aspirin 81 Mg Tbec (Aspirin) .... Take One Tablet By Mouth Daily 9)  Janumet 50-1000 Mg Tabs (Sitagliptin-Metformin Hcl) .Marland Kitchen.. 1 Tab Two Times A Day 10)  Calcium-Vitamin D 500-200 Mg-Unit Tabs (Calcium Carbonate-Vitamin D) .Marland Kitchen.. 1 Tab Once Daily 11)  Cpap .... At Bedtime 12)  Enablex 15 Mg Xr24h-Tab (Darifenacin Hydrobromide) .... 1/2 Tab Once Daily 13)  Nexium 40 Mg Cpdr (Esomeprazole Magnesium) .... Once Daily 14)  Accu-Chek Soft Touch Lancets  Misc (Lancets) .... Use As Directed 15)  Accu-Chek Aviva  Strp (Glucose Blood) .... Test As Directed  Allergies: 1)  ! Ace Inhibitors  Past History:  Past Medical History: Last updated: 09/17/2010 CAROTID ARTERY DISEASE (ICD-433.10), sees Dr. Juanito [name] AORTIC STENOSIS (ICD-424.1) HYPERTENSION (ICD-401.9) HYPERLIPIDEMIA (ICD-272.4) DIABETES MELLITUS,  TYPE II (ICD-250.00) OBSTRUCTIVE SLEEP APNEA (ICD-327.23) ELEVATED PROSTATE SPECIFIC ANTIGEN (ICD-790.93) HYPOTHYROIDISM (ICD-244.9) ANEMIA (ICD-285.9) UNS ADVRS EFF OTH RX MEDICINAL&BIOLOGICAL SBSTNC (ZOX-096.04) UTI (ICD-599.0)  Past Surgical History: Last updated: 09/17/2010 bilateral knee replacements Tonsillectomy Prostate surgery  Aortic valve replacement  on 08-18-10 with a 25-mm Edwards   [employer] pericardial tissue valve, model #3300 TFX, serial number   [id number] and coronary artery bypass grafting x2 with the left internal   mammary to the left anterior descending coronary artery and reversed   saphenous vein graft to the posterior descending coronary artery with   left thigh endovein harvesting.  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  quit 1980 Alcohol Use - yes Drug Use - no  Risk Factors: Smoking Status: quit (09/17/2010)  Review of Systems       negative other than history of present illness  Vital Signs:  Patient profile:   77 year old male Height:      68 inches Weight:      192 pounds BMI:     29.30 Pulse rate:   72 / minute Pulse rhythm:   irregular BP sitting:   108 / 62  (left arm) Cuff size:   large  Vitals Entered By: Stanley [name], CMA (January 04, 2011 10:32 AM)  Physical Exam  General:  no acute distress, skin  color looks good Head:  normocephalic and atraumatic Eyes:  PERRLA/EOM intact; conjunctiva and lids normal. Neck:  Neck supple, no JVD. No masses, thyromegaly or abnormal cervical nodes. Chest Stanley [name]:  well-healed median sternotomy Lungs:  Clear bilaterally to auscultation and percussion. Heart:  PMI nondisplaced, soft systolic murmur, S2 splits, no diastolic murmur Msk:  decreased ROM.   Pulses:  pulses normal  in all 4 extremities Extremities:  No clubbing or cyanosis. Neurologic:  Alert and oriented x 3. Skin:  Intact without lesions or rashes. Psych:  Normal affect.   Impression & Recommendations:  Problem # 1:  HYPOTENSION, UNSPECIFIED (ICD-458.9) Assessment Improved  Problem # 2:  CAD, NATIVE VESSEL (ICD-414.01)  His updated medication list for this problem includes:    Aspirin 81 Mg Tbec (Aspirin) .Marland Kitchen... Take one tablet by mouth daily  Problem # 3:  AORTIC VALVE REPLACEMENT, HX OF (ICD-V43.3) Assessment: Unchanged  Problem # 4:  CAROTID ARTERY DISEASE (ICD-433.10) Assessment: Unchanged  His updated medication list for this problem includes:    Aspirin 81 Mg Tbec (Aspirin) .Marland Kitchen... Take one tablet by mouth daily  Problem # 5:  HYPERTENSION (ICD-401.9) Assessment: Improved  His updated medication list for this problem includes:    Aspirin 81 Mg Tbec (Aspirin) .Marland Kitchen... Take one tablet by mouth daily  Problem # 6:  HYPERLIPIDEMIA (ICD-272.4)  His updated medication list for this problem includes:    Crestor 10 Mg Tabs (Rosuvastatin calcium) ..... Once daily    Zetia 10 Mg Tabs (Ezetimibe) ..... Once daily  Problem # 7:  DIABETES MELLITUS, TYPE II (ICD-250.00) Assessment: Unchanged  His updated medication list for this problem includes:    Glipizide Xl 10 Mg Xr24h-tab (Glipizide) .Marland Kitchen..Marland Kitchen Two tabs once daily    Actos 30 Mg Tabs (Pioglitazone hcl) ..... Once daily    Aspirin 81 Mg Tbec (Aspirin) .Marland Kitchen... Take one tablet by mouth daily    Janumet 50-1000 Mg Tabs (Sitagliptin-metformin hcl) .Marland Kitchen... 1 tab two times a day  Patient Instructions: 1)  Your physician recommends that you schedule a follow-up appointment in: Stanley 2012 2)  Your physician recommends that you continue on your current medications as directed. Please refer to the Current Medication list given to you today.

## 2011-02-04 NOTE — Miscellaneous (Signed)
Summary: [facility]   [facility]   Imported By: Roderic Ovens 01/25/2011 14:24:00  _____________________________________________________________________  External Attachment:    Type:   Image     Comment:   External Document

## 2011-02-18 ENCOUNTER — Other Ambulatory Visit: Payer: Self-pay | Admitting: *Deleted

## 2011-02-18 DIAGNOSIS — H10029 Other mucopurulent conjunctivitis, unspecified eye: Secondary | ICD-10-CM

## 2011-02-18 MED ORDER — GENTAMICIN SULFATE 0.3 % OP SOLN: 2.0000 [drp] | Freq: Two times a day (BID) | OPHTHALMIC | Status: AC

## 2011-02-18 NOTE — Telephone Encounter (Signed)
rx sent to pharmacy per dr Tawanna Cooler

## 2011-03-02 LAB — GLUCOSE, CAPILLARY: Glucose-Capillary: 179 mg/dL — ABNORMAL HIGH (ref 70–99)

## 2011-03-04 LAB — URINALYSIS, MICROSCOPIC ONLY
Bilirubin Urine: NEGATIVE
Glucose, UA: NEGATIVE mg/dL
Ketones, ur: NEGATIVE mg/dL
Nitrite: NEGATIVE
Protein, ur: NEGATIVE mg/dL
Specific Gravity, Urine: 1.016 (ref 1.005–1.030)
Urobilinogen, UA: 0.2 mg/dL (ref 0.0–1.0)
pH: 5.5 (ref 5.0–8.0)

## 2011-03-04 LAB — GLUCOSE, CAPILLARY
Glucose-Capillary: 101 mg/dL — ABNORMAL HIGH (ref 70–99)
Glucose-Capillary: 140 mg/dL — ABNORMAL HIGH (ref 70–99)
Glucose-Capillary: 157 mg/dL — ABNORMAL HIGH (ref 70–99)
Glucose-Capillary: 158 mg/dL — ABNORMAL HIGH (ref 70–99)
Glucose-Capillary: 162 mg/dL — ABNORMAL HIGH (ref 70–99)
Glucose-Capillary: 174 mg/dL — ABNORMAL HIGH (ref 70–99)
Glucose-Capillary: 185 mg/dL — ABNORMAL HIGH (ref 70–99)
Glucose-Capillary: 231 mg/dL — ABNORMAL HIGH (ref 70–99)
Glucose-Capillary: 39 mg/dL — CL (ref 70–99)
Glucose-Capillary: 56 mg/dL — ABNORMAL LOW (ref 70–99)
Glucose-Capillary: 63 mg/dL — ABNORMAL LOW (ref 70–99)
Glucose-Capillary: 78 mg/dL (ref 70–99)
Glucose-Capillary: 80 mg/dL (ref 70–99)
Glucose-Capillary: 81 mg/dL (ref 70–99)
Glucose-Capillary: 82 mg/dL (ref 70–99)

## 2011-03-04 LAB — BASIC METABOLIC PANEL
BUN: 10 mg/dL (ref 6–23)
BUN: 12 mg/dL (ref 6–23)
BUN: 12 mg/dL (ref 6–23)
CO2: 26 mEq/L (ref 19–32)
CO2: 28 mEq/L (ref 19–32)
CO2: 29 mEq/L (ref 19–32)
Calcium: 7.4 mg/dL — ABNORMAL LOW (ref 8.4–10.5)
Calcium: 7.9 mg/dL — ABNORMAL LOW (ref 8.4–10.5)
Calcium: 8.1 mg/dL — ABNORMAL LOW (ref 8.4–10.5)
Chloride: 102 mEq/L (ref 96–112)
Chloride: 92 mEq/L — ABNORMAL LOW (ref 96–112)
Chloride: 98 mEq/L (ref 96–112)
Creatinine, Ser: 0.72 mg/dL (ref 0.4–1.5)
Creatinine, Ser: 0.84 mg/dL (ref 0.4–1.5)
Creatinine, Ser: 0.85 mg/dL (ref 0.4–1.5)
GFR calc Af Amer: >60 mL/min (ref >60)
GFR calc Af Amer: >60 mL/min (ref >60)
GFR calc Af Amer: >60 mL/min (ref >60)
GFR calc non Af Amer: >60 mL/min (ref >60)
GFR calc non Af Amer: >60 mL/min (ref >60)
GFR calc non Af Amer: >60 mL/min (ref >60)
Glucose, Bld: 127 mg/dL — ABNORMAL HIGH (ref 70–99)
Glucose, Bld: 142 mg/dL — ABNORMAL HIGH (ref 70–99)
Glucose, Bld: 88 mg/dL (ref 70–99)
Potassium: 4.3 mEq/L (ref 3.5–5.1)
Potassium: 5 mEq/L (ref 3.5–5.1)
Potassium: 5.1 mEq/L (ref 3.5–5.1)
Sodium: 123 mEq/L — ABNORMAL LOW (ref 135–145)
Sodium: 130 mEq/L — ABNORMAL LOW (ref 135–145)
Sodium: 134 mEq/L — ABNORMAL LOW (ref 135–145)

## 2011-03-04 LAB — CBC
HCT: 26.5 % — ABNORMAL LOW (ref 39.0–52.0)
HCT: 28.7 % — ABNORMAL LOW (ref 39.0–52.0)
HCT: 30.7 % — ABNORMAL LOW (ref 39.0–52.0)
Hemoglobin: 10.1 g/dL — ABNORMAL LOW (ref 13.0–17.0)
Hemoglobin: 8.8 g/dL — ABNORMAL LOW (ref 13.0–17.0)
Hemoglobin: 9.4 g/dL — ABNORMAL LOW (ref 13.0–17.0)
MCH: 31 pg (ref 26.0–34.0)
MCH: 31.4 pg (ref 26.0–34.0)
MCH: 31.5 pg (ref 26.0–34.0)
MCHC: 32.8 g/dL (ref 30.0–36.0)
MCHC: 32.9 g/dL (ref 30.0–36.0)
MCHC: 33.2 g/dL (ref 30.0–36.0)
MCV: 94.7 fL (ref 78.0–100.0)
MCV: 95 fL (ref 78.0–100.0)
MCV: 95.3 fL (ref 78.0–100.0)
Platelets: 139 10*3/uL — ABNORMAL LOW (ref 150–400)
Platelets: 178 10*3/uL (ref 150–400)
Platelets: 178 10*3/uL (ref 150–400)
RBC: 2.79 MIL/uL — ABNORMAL LOW (ref 4.22–5.81)
RBC: 3.03 MIL/uL — ABNORMAL LOW (ref 4.22–5.81)
RBC: 3.22 MIL/uL — ABNORMAL LOW (ref 4.22–5.81)
RDW: 13.3 % (ref 11.5–15.5)
RDW: 13.3 % (ref 11.5–15.5)
RDW: 13.4 % (ref 11.5–15.5)
WBC: 11.7 10*3/uL — ABNORMAL HIGH (ref 4.0–10.5)
WBC: 15.7 10*3/uL — ABNORMAL HIGH (ref 4.0–10.5)
WBC: 8 10*3/uL (ref 4.0–10.5)

## 2011-03-04 LAB — URINE CULTURE
Colony Count: NO GROWTH
Culture  Setup Time: 201109020230
Culture: NO GROWTH

## 2011-03-05 ENCOUNTER — Other Ambulatory Visit: Payer: Self-pay

## 2011-03-05 LAB — BLOOD GAS, ARTERIAL
Acid-base deficit: 1.1 mmol/L (ref 0.0–2.0)
Bicarbonate: 22.5 mEq/L (ref 20.0–24.0)
Drawn by: 333511
O2 Saturation: 99.2 %
Patient temperature: 98.6
TCO2: 23.6 mmol/L (ref 0–100)
pCO2 arterial: 34 mmHg — ABNORMAL LOW (ref 35.0–45.0)
pH, Arterial: 7.437 (ref 7.350–7.450)
pO2, Arterial: 132 mmHg — ABNORMAL HIGH (ref 80.0–100.0)

## 2011-03-05 LAB — TYPE AND SCREEN
ABO/RH(D): O POS
Antibody Screen: NEGATIVE

## 2011-03-05 LAB — POCT I-STAT, CHEM 8
BUN: 10 mg/dL (ref 6–23)
BUN: 11 mg/dL (ref 6–23)
Calcium, Ion: 1.07 mmol/L — ABNORMAL LOW (ref 1.12–1.32)
Calcium, Ion: 1.11 mmol/L — ABNORMAL LOW (ref 1.12–1.32)
Chloride: 101 mEq/L (ref 96–112)
Chloride: 95 mEq/L — ABNORMAL LOW (ref 96–112)
Creatinine, Ser: 0.6 mg/dL (ref 0.4–1.5)
Creatinine, Ser: 0.7 mg/dL (ref 0.4–1.5)
Glucose, Bld: 171 mg/dL — ABNORMAL HIGH (ref 70–99)
Glucose, Bld: 205 mg/dL — ABNORMAL HIGH (ref 70–99)
HCT: 28 % — ABNORMAL LOW (ref 39.0–52.0)
HCT: 33 % — ABNORMAL LOW (ref 39.0–52.0)
Hemoglobin: 11.2 g/dL — ABNORMAL LOW (ref 13.0–17.0)
Hemoglobin: 9.5 g/dL — ABNORMAL LOW (ref 13.0–17.0)
Potassium: 4.2 mEq/L (ref 3.5–5.1)
Potassium: 4.5 mEq/L (ref 3.5–5.1)
Sodium: 131 mEq/L — ABNORMAL LOW (ref 135–145)
Sodium: 133 mEq/L — ABNORMAL LOW (ref 135–145)
TCO2: 22 mmol/L (ref 0–100)
TCO2: 24 mmol/L (ref 0–100)

## 2011-03-05 LAB — GLUCOSE, CAPILLARY
Glucose-Capillary: 100 mg/dL — ABNORMAL HIGH (ref 70–99)
Glucose-Capillary: 101 mg/dL — ABNORMAL HIGH (ref 70–99)
Glucose-Capillary: 103 mg/dL — ABNORMAL HIGH (ref 70–99)
Glucose-Capillary: 106 mg/dL — ABNORMAL HIGH (ref 70–99)
Glucose-Capillary: 113 mg/dL — ABNORMAL HIGH (ref 70–99)
Glucose-Capillary: 114 mg/dL — ABNORMAL HIGH (ref 70–99)
Glucose-Capillary: 123 mg/dL — ABNORMAL HIGH (ref 70–99)
Glucose-Capillary: 128 mg/dL — ABNORMAL HIGH (ref 70–99)
Glucose-Capillary: 131 mg/dL — ABNORMAL HIGH (ref 70–99)
Glucose-Capillary: 134 mg/dL — ABNORMAL HIGH (ref 70–99)
Glucose-Capillary: 136 mg/dL — ABNORMAL HIGH (ref 70–99)
Glucose-Capillary: 141 mg/dL — ABNORMAL HIGH (ref 70–99)
Glucose-Capillary: 149 mg/dL — ABNORMAL HIGH (ref 70–99)
Glucose-Capillary: 151 mg/dL — ABNORMAL HIGH (ref 70–99)
Glucose-Capillary: 175 mg/dL — ABNORMAL HIGH (ref 70–99)
Glucose-Capillary: 178 mg/dL — ABNORMAL HIGH (ref 70–99)
Glucose-Capillary: 273 mg/dL — ABNORMAL HIGH (ref 70–99)
Glucose-Capillary: 88 mg/dL (ref 70–99)
Glucose-Capillary: 97 mg/dL (ref 70–99)

## 2011-03-05 LAB — POCT I-STAT 4, (NA,K, GLUC, HGB,HCT)
Glucose, Bld: 106 mg/dL — ABNORMAL HIGH (ref 70–99)
Glucose, Bld: 118 mg/dL — ABNORMAL HIGH (ref 70–99)
Glucose, Bld: 119 mg/dL — ABNORMAL HIGH (ref 70–99)
Glucose, Bld: 119 mg/dL — ABNORMAL HIGH (ref 70–99)
Glucose, Bld: 136 mg/dL — ABNORMAL HIGH (ref 70–99)
Glucose, Bld: 137 mg/dL — ABNORMAL HIGH (ref 70–99)
Glucose, Bld: 162 mg/dL — ABNORMAL HIGH (ref 70–99)
Glucose, Bld: 95 mg/dL (ref 70–99)
HCT: 26 % — ABNORMAL LOW (ref 39.0–52.0)
HCT: 26 % — ABNORMAL LOW (ref 39.0–52.0)
HCT: 28 % — ABNORMAL LOW (ref 39.0–52.0)
HCT: 29 % — ABNORMAL LOW (ref 39.0–52.0)
HCT: 33 % — ABNORMAL LOW (ref 39.0–52.0)
HCT: 37 % — ABNORMAL LOW (ref 39.0–52.0)
HCT: 39 % (ref 39.0–52.0)
HCT: 43 % (ref 39.0–52.0)
Hemoglobin: 11.2 g/dL — ABNORMAL LOW (ref 13.0–17.0)
Hemoglobin: 12.6 g/dL — ABNORMAL LOW (ref 13.0–17.0)
Hemoglobin: 13.3 g/dL (ref 13.0–17.0)
Hemoglobin: 14.6 g/dL (ref 13.0–17.0)
Hemoglobin: 8.8 g/dL — ABNORMAL LOW (ref 13.0–17.0)
Hemoglobin: 8.8 g/dL — ABNORMAL LOW (ref 13.0–17.0)
Hemoglobin: 9.5 g/dL — ABNORMAL LOW (ref 13.0–17.0)
Hemoglobin: 9.9 g/dL — ABNORMAL LOW (ref 13.0–17.0)
Potassium: 3.4 mEq/L — ABNORMAL LOW (ref 3.5–5.1)
Potassium: 3.6 mEq/L (ref 3.5–5.1)
Potassium: 4.1 mEq/L (ref 3.5–5.1)
Potassium: 4.1 mEq/L (ref 3.5–5.1)
Potassium: 4.2 mEq/L (ref 3.5–5.1)
Potassium: 4.4 mEq/L (ref 3.5–5.1)
Potassium: 4.5 mEq/L (ref 3.5–5.1)
Potassium: 5.3 mEq/L — ABNORMAL HIGH (ref 3.5–5.1)
Sodium: 129 mEq/L — ABNORMAL LOW (ref 135–145)
Sodium: 130 mEq/L — ABNORMAL LOW (ref 135–145)
Sodium: 130 mEq/L — ABNORMAL LOW (ref 135–145)
Sodium: 132 mEq/L — ABNORMAL LOW (ref 135–145)
Sodium: 132 mEq/L — ABNORMAL LOW (ref 135–145)
Sodium: 132 mEq/L — ABNORMAL LOW (ref 135–145)
Sodium: 135 mEq/L (ref 135–145)
Sodium: 136 mEq/L (ref 135–145)

## 2011-03-05 LAB — CBC
HCT: 26.8 % — ABNORMAL LOW (ref 39.0–52.0)
HCT: 28.7 % — ABNORMAL LOW (ref 39.0–52.0)
HCT: 28.7 % — ABNORMAL LOW (ref 39.0–52.0)
HCT: 29.6 % — ABNORMAL LOW (ref 39.0–52.0)
HCT: 29.9 % — ABNORMAL LOW (ref 39.0–52.0)
HCT: 40.4 % (ref 39.0–52.0)
Hemoglobin: 10 g/dL — ABNORMAL LOW (ref 13.0–17.0)
Hemoglobin: 10 g/dL — ABNORMAL LOW (ref 13.0–17.0)
Hemoglobin: 13.6 g/dL (ref 13.0–17.0)
Hemoglobin: 9.1 g/dL — ABNORMAL LOW (ref 13.0–17.0)
Hemoglobin: 9.6 g/dL — ABNORMAL LOW (ref 13.0–17.0)
Hemoglobin: 9.7 g/dL — ABNORMAL LOW (ref 13.0–17.0)
MCH: 30.9 pg (ref 26.0–34.0)
MCH: 31.1 pg (ref 26.0–34.0)
MCH: 31.1 pg (ref 26.0–34.0)
MCH: 31.3 pg (ref 26.0–34.0)
MCH: 31.5 pg (ref 26.0–34.0)
MCH: 31.7 pg (ref 26.0–34.0)
MCHC: 33.4 g/dL (ref 30.0–36.0)
MCHC: 33.4 g/dL (ref 30.0–36.0)
MCHC: 33.7 g/dL (ref 30.0–36.0)
MCHC: 33.8 g/dL (ref 30.0–36.0)
MCHC: 33.8 g/dL (ref 30.0–36.0)
MCHC: 34 g/dL (ref 30.0–36.0)
MCV: 91.4 fL (ref 78.0–100.0)
MCV: 91.9 fL (ref 78.0–100.0)
MCV: 92.1 fL (ref 78.0–100.0)
MCV: 92.4 fL (ref 78.0–100.0)
MCV: 94.3 fL (ref 78.0–100.0)
MCV: 94.7 fL (ref 78.0–100.0)
Platelets: 101 10*3/uL — ABNORMAL LOW (ref 150–400)
Platelets: 101 10*3/uL — ABNORMAL LOW (ref 150–400)
Platelets: 117 10*3/uL — ABNORMAL LOW (ref 150–400)
Platelets: 125 10*3/uL — ABNORMAL LOW (ref 150–400)
Platelets: 125 10*3/uL — ABNORMAL LOW (ref 150–400)
Platelets: 201 10*3/uL (ref 150–400)
RBC: 2.91 MIL/uL — ABNORMAL LOW (ref 4.22–5.81)
RBC: 3.03 MIL/uL — ABNORMAL LOW (ref 4.22–5.81)
RBC: 3.14 MIL/uL — ABNORMAL LOW (ref 4.22–5.81)
RBC: 3.17 MIL/uL — ABNORMAL LOW (ref 4.22–5.81)
RBC: 3.22 MIL/uL — ABNORMAL LOW (ref 4.22–5.81)
RBC: 4.37 MIL/uL (ref 4.22–5.81)
RDW: 13.5 % (ref 11.5–15.5)
RDW: 13.7 % (ref 11.5–15.5)
RDW: 13.7 % (ref 11.5–15.5)
RDW: 13.7 % (ref 11.5–15.5)
RDW: 14 % (ref 11.5–15.5)
RDW: 14.2 % (ref 11.5–15.5)
WBC: 10.7 10*3/uL — ABNORMAL HIGH (ref 4.0–10.5)
WBC: 11.1 10*3/uL — ABNORMAL HIGH (ref 4.0–10.5)
WBC: 12.5 10*3/uL — ABNORMAL HIGH (ref 4.0–10.5)
WBC: 13.4 10*3/uL — ABNORMAL HIGH (ref 4.0–10.5)
WBC: 14.3 10*3/uL — ABNORMAL HIGH (ref 4.0–10.5)
WBC: 7.8 10*3/uL (ref 4.0–10.5)

## 2011-03-05 LAB — BASIC METABOLIC PANEL
BUN: 11 mg/dL (ref 6–23)
BUN: 11 mg/dL (ref 6–23)
CO2: 25 mEq/L (ref 19–32)
CO2: 26 mEq/L (ref 19–32)
Calcium: 7.2 mg/dL — ABNORMAL LOW (ref 8.4–10.5)
Calcium: 7.4 mg/dL — ABNORMAL LOW (ref 8.4–10.5)
Chloride: 100 mEq/L (ref 96–112)
Chloride: 103 mEq/L (ref 96–112)
Creatinine, Ser: 0.63 mg/dL (ref 0.4–1.5)
Creatinine, Ser: 0.78 mg/dL (ref 0.4–1.5)
GFR calc Af Amer: >60 mL/min (ref >60)
GFR calc Af Amer: >60 mL/min (ref >60)
GFR calc non Af Amer: >60 mL/min (ref >60)
GFR calc non Af Amer: >60 mL/min (ref >60)
Glucose, Bld: 123 mg/dL — ABNORMAL HIGH (ref 70–99)
Glucose, Bld: 132 mg/dL — ABNORMAL HIGH (ref 70–99)
Potassium: 3.7 mEq/L (ref 3.5–5.1)
Potassium: 4.1 mEq/L (ref 3.5–5.1)
Sodium: 129 mEq/L — ABNORMAL LOW (ref 135–145)
Sodium: 131 mEq/L — ABNORMAL LOW (ref 135–145)

## 2011-03-05 LAB — POCT I-STAT 3, ART BLOOD GAS (G3+)
Acid-base deficit: 2 mmol/L (ref 0.0–2.0)
Acid-base deficit: 3 mmol/L — ABNORMAL HIGH (ref 0.0–2.0)
Acid-base deficit: 3 mmol/L — ABNORMAL HIGH (ref 0.0–2.0)
Acid-base deficit: 3 mmol/L — ABNORMAL HIGH (ref 0.0–2.0)
Bicarbonate: 22 mEq/L (ref 20.0–24.0)
Bicarbonate: 22.2 mEq/L (ref 20.0–24.0)
Bicarbonate: 23.6 mEq/L (ref 20.0–24.0)
Bicarbonate: 23.6 mEq/L (ref 20.0–24.0)
Bicarbonate: 24.8 mEq/L — ABNORMAL HIGH (ref 20.0–24.0)
O2 Saturation: 100 %
O2 Saturation: 100 %
O2 Saturation: 92 %
O2 Saturation: 98 %
O2 Saturation: 99 %
Patient temperature: 34.7
Patient temperature: 36.8
TCO2: 23 mmol/L (ref 0–100)
TCO2: 23 mmol/L (ref 0–100)
TCO2: 25 mmol/L (ref 0–100)
TCO2: 25 mmol/L (ref 0–100)
TCO2: 26 mmol/L (ref 0–100)
pCO2 arterial: 34.2 mmHg — ABNORMAL LOW (ref 35.0–45.0)
pCO2 arterial: 40.8 mmHg (ref 35.0–45.0)
pCO2 arterial: 41.5 mmHg (ref 35.0–45.0)
pCO2 arterial: 42 mmHg (ref 35.0–45.0)
pCO2 arterial: 47.6 mmHg — ABNORMAL HIGH (ref 35.0–45.0)
pH, Arterial: 7.304 — ABNORMAL LOW (ref 7.350–7.450)
pH, Arterial: 7.336 — ABNORMAL LOW (ref 7.350–7.450)
pH, Arterial: 7.358 (ref 7.350–7.450)
pH, Arterial: 7.391 (ref 7.350–7.450)
pH, Arterial: 7.407 (ref 7.350–7.450)
pO2, Arterial: 112 mmHg — ABNORMAL HIGH (ref 80.0–100.0)
pO2, Arterial: 126 mmHg — ABNORMAL HIGH (ref 80.0–100.0)
pO2, Arterial: 188 mmHg — ABNORMAL HIGH (ref 80.0–100.0)
pO2, Arterial: 327 mmHg — ABNORMAL HIGH (ref 80.0–100.0)
pO2, Arterial: 55 mmHg — ABNORMAL LOW (ref 80.0–100.0)

## 2011-03-05 LAB — COMPREHENSIVE METABOLIC PANEL
ALT: 25 U/L (ref 0–53)
AST: 26 U/L (ref 0–37)
Albumin: 3.7 g/dL (ref 3.5–5.2)
Alkaline Phosphatase: 73 U/L (ref 39–117)
BUN: 15 mg/dL (ref 6–23)
CO2: 21 mEq/L (ref 19–32)
Calcium: 8.7 mg/dL (ref 8.4–10.5)
Chloride: 97 mEq/L (ref 96–112)
Creatinine, Ser: 0.75 mg/dL (ref 0.4–1.5)
GFR calc Af Amer: >60 mL/min (ref >60)
GFR calc non Af Amer: >60 mL/min (ref >60)
Glucose, Bld: 160 mg/dL — ABNORMAL HIGH (ref 70–99)
Potassium: 4.5 mEq/L (ref 3.5–5.1)
Sodium: 126 mEq/L — ABNORMAL LOW (ref 135–145)
Total Bilirubin: 0.5 mg/dL (ref 0.3–1.2)
Total Protein: 5.8 g/dL — ABNORMAL LOW (ref 6.0–8.3)

## 2011-03-05 LAB — POCT I-STAT 3, VENOUS BLOOD GAS (G3P V)
Acid-base deficit: 4 mmol/L — ABNORMAL HIGH (ref 0.0–2.0)
Bicarbonate: 23.2 mEq/L (ref 20.0–24.0)
O2 Saturation: 80 %
TCO2: 25 mmol/L (ref 0–100)
pCO2, Ven: 48.1 mmHg (ref 45.0–50.0)
pH, Ven: 7.292 (ref 7.250–7.300)
pO2, Ven: 50 mmHg — ABNORMAL HIGH (ref 30.0–45.0)

## 2011-03-05 LAB — URINALYSIS, ROUTINE W REFLEX MICROSCOPIC
Glucose, UA: 250 mg/dL — AB
Hgb urine dipstick: NEGATIVE
Ketones, ur: NEGATIVE mg/dL
Nitrite: NEGATIVE
Protein, ur: NEGATIVE mg/dL
Specific Gravity, Urine: 1.024 (ref 1.005–1.030)
Urobilinogen, UA: 0.2 mg/dL (ref 0.0–1.0)
pH: 5.5 (ref 5.0–8.0)

## 2011-03-05 LAB — CREATININE, SERUM
Creatinine, Ser: 0.53 mg/dL (ref 0.4–1.5)
Creatinine, Ser: 0.73 mg/dL (ref 0.4–1.5)
GFR calc Af Amer: >60 mL/min (ref >60)
GFR calc Af Amer: >60 mL/min (ref >60)
GFR calc non Af Amer: >60 mL/min (ref >60)
GFR calc non Af Amer: >60 mL/min (ref >60)

## 2011-03-05 LAB — APTT
aPTT: 27 seconds (ref 24–37)
aPTT: 43 seconds — ABNORMAL HIGH (ref 24–37)

## 2011-03-05 LAB — MAGNESIUM
Magnesium: 2.2 mg/dL (ref 1.5–2.5)
Magnesium: 2.2 mg/dL (ref 1.5–2.5)
Magnesium: 2.3 mg/dL (ref 1.5–2.5)

## 2011-03-05 LAB — PLATELET COUNT: Platelets: 139 10*3/uL — ABNORMAL LOW (ref 150–400)

## 2011-03-05 LAB — HEMOGLOBIN AND HEMATOCRIT, BLOOD
HCT: 26.1 % — ABNORMAL LOW (ref 39.0–52.0)
Hemoglobin: 8.9 g/dL — ABNORMAL LOW (ref 13.0–17.0)

## 2011-03-05 LAB — ABO/RH: ABO/RH(D): O POS

## 2011-03-05 LAB — PROTIME-INR
INR: 0.95 (ref 0.00–1.49)
INR: 1.67 — ABNORMAL HIGH (ref 0.00–1.49)
Prothrombin Time: 12.9 seconds (ref 11.6–15.2)
Prothrombin Time: 19.9 seconds — ABNORMAL HIGH (ref 11.6–15.2)

## 2011-03-05 LAB — HEMOGLOBIN A1C
Hgb A1c MFr Bld: 6.7 % — ABNORMAL HIGH (ref <5.7)
Mean Plasma Glucose: 146 mg/dL — ABNORMAL HIGH (ref <117)

## 2011-03-05 LAB — POCT I-STAT GLUCOSE
Glucose, Bld: 140 mg/dL — ABNORMAL HIGH (ref 70–99)
Operator id: 195151

## 2011-03-05 LAB — MRSA PCR SCREENING: MRSA by PCR: NEGATIVE

## 2011-03-05 LAB — SURGICAL PCR SCREEN
MRSA, PCR: NEGATIVE
Staphylococcus aureus: NEGATIVE

## 2011-03-05 MED ORDER — HYDROXYZINE HCL 25 MG PO TABS: 25.0000 mg | ORAL_TABLET | Freq: Three times a day (TID) | ORAL | Status: AC | PRN

## 2011-03-07 LAB — POCT I-STAT 3, VENOUS BLOOD GAS (G3P V)
Bicarbonate: 21.4 mEq/L (ref 20.0–24.0)
O2 Saturation: 66 %
TCO2: 25 mmol/L (ref 0–100)
pCO2, Ven: 44.4 mmHg — ABNORMAL LOW (ref 45.0–50.0)
pCO2, Ven: 44.5 mmHg — ABNORMAL LOW (ref 45.0–50.0)
pH, Ven: 7.292 (ref 7.250–7.300)
pO2, Ven: 37 mmHg (ref 30.0–45.0)
pO2, Ven: 37 mmHg (ref 30.0–45.0)

## 2011-03-07 LAB — POCT I-STAT 3, ART BLOOD GAS (G3+)
Acid-base deficit: 3 mmol/L — ABNORMAL HIGH (ref 0.0–2.0)
Bicarbonate: 21.8 mEq/L (ref 20.0–24.0)
TCO2: 23 mmol/L (ref 0–100)
pO2, Arterial: 129 mmHg — ABNORMAL HIGH (ref 80.0–100.0)

## 2011-03-07 LAB — GLUCOSE, CAPILLARY: Glucose-Capillary: 136 mg/dL — ABNORMAL HIGH (ref 70–99)

## 2011-04-06 ENCOUNTER — Other Ambulatory Visit (INDEPENDENT_AMBULATORY_CARE_PROVIDER_SITE_OTHER): Payer: Self-pay | Admitting: Family Medicine

## 2011-04-06 DIAGNOSIS — R972 Elevated prostate specific antigen [PSA]: Secondary | ICD-10-CM

## 2011-04-06 DIAGNOSIS — D643 Other sideroblastic anemias: Secondary | ICD-10-CM

## 2011-04-06 DIAGNOSIS — E785 Hyperlipidemia, unspecified: Secondary | ICD-10-CM

## 2011-04-06 LAB — HEPATIC FUNCTION PANEL
AST: 18 U/L (ref 0–37)
Albumin: 3.7 g/dL (ref 3.5–5.2)
Total Protein: 6.4 g/dL (ref 6.0–8.3)

## 2011-04-06 LAB — PSA: PSA: 11.55 ng/mL — ABNORMAL HIGH (ref 0.10–4.00)

## 2011-04-06 LAB — CBC WITH DIFFERENTIAL/PLATELET
Basophils Absolute: 0.1 10*3/uL (ref 0.0–0.1)
Basophils Relative: 0.9 % (ref 0.0–3.0)
Eosinophils Relative: 1.8 % (ref 0.0–5.0)
HCT: 31.5 % — ABNORMAL LOW (ref 39.0–52.0)
Hemoglobin: 10 g/dL — ABNORMAL LOW (ref 13.0–17.0)
Lymphocytes Relative: 18.4 % (ref 12.0–46.0)
Lymphs Abs: 1.1 10*3/uL (ref 0.7–4.0)
Monocytes Relative: 10.8 % (ref 3.0–12.0)
Neutro Abs: 4.1 10*3/uL (ref 1.4–7.7)
RBC: 4.44 Mil/uL (ref 4.22–5.81)
RDW: 18.7 % — ABNORMAL HIGH (ref 11.5–14.6)

## 2011-04-06 LAB — LIPID PANEL: HDL: 48.8 mg/dL (ref >39.00)

## 2011-04-06 LAB — HEMOGLOBIN A1C: Hgb A1c MFr Bld: 7.2 % — ABNORMAL HIGH (ref 4.6–6.5)

## 2011-04-07 ENCOUNTER — Telehealth: Payer: Self-pay

## 2011-04-07 MED ORDER — DOXYCYCLINE HYCLATE 100 MG PO TABS: 100.0000 mg | ORAL_TABLET | Freq: Two times a day (BID) | ORAL | Status: AC

## 2011-04-07 NOTE — Telephone Encounter (Signed)
Pt aware rx called in.  

## 2011-04-07 NOTE — Telephone Encounter (Signed)
Pt aware and rx called  

## 2011-04-07 NOTE — Telephone Encounter (Signed)
Message copied by Madison Hickman on Wed Apr 07, 2011  9:06 AM ------      Message from: Dwaine Deter      Created: Wed Apr 07, 2011  9:02 AM       Still anemic, so he will restart one iron pill a day. PSA has jumped up again so call in Doxycycline 100mg  bid for 14 days. We will repeat a PSA several weeks after this is finished. Pt. Aware

## 2011-04-07 NOTE — Telephone Encounter (Signed)
Message copied by Madison Hickman on Wed Apr 07, 2011  9:10 AM ------      Message from: Dwaine Deter      Created: Wed Apr 07, 2011  9:02 AM       Still anemic, so he will restart one iron pill a day. PSA has jumped up again so call in Doxycycline 100mg  bid for 14 days. We will repeat a PSA several weeks after this is finished. Pt. Aware

## 2011-04-07 NOTE — Telephone Encounter (Signed)
Message copied by Madison Hickman on Wed Apr 07, 2011  9:11 AM ------      Message from: Dwaine Deter      Created: Wed Apr 07, 2011  9:00 AM       His diabetes is stable, lipids are stable. Still anemic, so he will get back on iron pills once a day. His PSA has jumped up again, which could be from some prostatitis. Will have him take Doxycycline for 14 days, then 2-3 weeks after that repeat a PSA. Patient is aware of this.

## 2011-04-26 ENCOUNTER — Encounter: Payer: Self-pay | Admitting: Family Medicine

## 2011-04-26 ENCOUNTER — Ambulatory Visit (INDEPENDENT_AMBULATORY_CARE_PROVIDER_SITE_OTHER): Payer: Self-pay | Admitting: Family Medicine

## 2011-04-26 VITALS — BP 136/72 | HR 93 | Temp 97.9°F | Resp 16 | Wt 198.5 lb

## 2011-04-26 DIAGNOSIS — R0602 Shortness of breath: Secondary | ICD-10-CM

## 2011-04-26 DIAGNOSIS — D649 Anemia, unspecified: Secondary | ICD-10-CM

## 2011-04-26 DIAGNOSIS — R972 Elevated prostate specific antigen [PSA]: Secondary | ICD-10-CM

## 2011-04-26 NOTE — Progress Notes (Signed)
  Subjective:    Patient ID: Stanley [name], male    DOB: 08-31-1925, 77 y.o.   MRN: [id number]  HPI Here for 2 days of SOB on exertion. This is not severe but it is a change from his baseline. No palpitations or chest pains. No URI symptoms or fever. No recent medication changes. Of note his labs on 04-06-11 showed a Hgb of 10.0 and a PSA of 11.55. We set up a referral to Urology, and we had him start back on iron supplements bid .    Review of Systems  Constitutional: Negative for fever, activity change, appetite change and unexpected weight change.  HENT: Negative.   Respiratory: Positive for cough and shortness of breath. Negative for choking, chest tightness and wheezing.   Cardiovascular: Negative for chest pain, palpitations and leg swelling.  Genitourinary: Negative.        Objective:   Physical Exam  Constitutional: He is oriented to person, place, and time. He appears well-developed and well-nourished.  Neck: No thyromegaly present.  Cardiovascular: Normal rate, regular rhythm and intact distal pulses.  Exam reveals no gallop and no friction rub.   Murmur heard.      EKG is normal   Pulmonary/Chest: Effort normal and breath sounds normal. No respiratory distress. He has no wheezes. He has no rales. He exhibits no tenderness.  Lymphadenopathy:    He has no cervical adenopathy.  Neurological: He is alert and oriented to person, place, and time.          Assessment & Plan:  SOB on exertion with no obvious cardiac causes. He could have some paroxysmal atrial fibrillation or some ischemia. He has known anemia, which could be worse. Check some labs today, and I will follow up with him tomorrow morning.

## 2011-04-27 LAB — CBC WITH DIFFERENTIAL/PLATELET
Basophils Absolute: 0 10*3/uL (ref 0.0–0.1)
Eosinophils Absolute: 0.2 10*3/uL (ref 0.0–0.7)
Lymphocytes Relative: 11.9 % — ABNORMAL LOW (ref 12.0–46.0)
MCHC: 31.7 g/dL (ref 30.0–36.0)
MCV: 71.2 fl — ABNORMAL LOW (ref 78.0–100.0)
Monocytes Absolute: 0.7 10*3/uL (ref 0.1–1.0)
Neutrophils Relative %: 77.5 % — ABNORMAL HIGH (ref 43.0–77.0)
Platelets: 283 10*3/uL (ref 150.0–400.0)
RDW: 20.5 % — ABNORMAL HIGH (ref 11.5–14.6)

## 2011-04-27 LAB — BRAIN NATRIURETIC PEPTIDE: Pro B Natriuretic peptide (BNP): 142 pg/mL — ABNORMAL HIGH (ref 0.0–100.0)

## 2011-04-27 LAB — VITAMIN B12: Vitamin B-12: 196 pg/mL — ABNORMAL LOW (ref 211–911)

## 2011-04-27 LAB — FERRITIN: Ferritin: 11.2 ng/mL — ABNORMAL LOW (ref 22.0–322.0)

## 2011-04-28 ENCOUNTER — Telehealth: Payer: Self-pay | Admitting: Cardiology

## 2011-04-28 NOTE — Progress Notes (Signed)
Pt has been scheduled for Feraheme Iron infusion Tx @ [facility] for Friday 04/30/2011 @ 9:00am. Pt is aware.

## 2011-04-28 NOTE — Telephone Encounter (Signed)
pts wife called with concerns of her husbands health.  His color is bad as well as his general feeling.  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 Pt will be out for just a little while.

## 2011-04-29 NOTE — Telephone Encounter (Signed)
Dr. Daleen Squibb called pt wife,Sue, and talked with her today. Mylo Red RN

## 2011-04-30 ENCOUNTER — Ambulatory Visit (HOSPITAL_COMMUNITY): Payer: Medicare Other | Attending: Family Medicine

## 2011-04-30 DIAGNOSIS — D649 Anemia, unspecified: Secondary | ICD-10-CM | POA: Insufficient documentation

## 2011-05-04 NOTE — Assessment & Plan Note (Signed)
[facility]   NAME:  Stanley [name], Stanley [name] NO.:  [id number]   MEDICAL RECORD NO.:  [id number]      DATE OF BIRTH:  August 10, 1925   PHYSICIAN:  Leonie Man, M.D.         VISIT DATE:                                   OFFICE VISIT   PROBLEM:  Nonhealing wound on the third toe of right foot.   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  Last seen in the wound center on July 07, 2009, where he underwent  debridement of the remaining toenail within the nailbed.  He was  continued on doxycycline 500 mg b.i.d. and daily wound dressing changes  along with Mupirocin.  He returns today for evaluation.  The patient is  feeling well without any complaints.  Temperature 97.5, pulse 65,  respirations 18, blood pressure 108/67.  Capillary blood glucose is 105.   The wound is now healed.  I completed the debridement of the extruding  toenail on the third toe of the right foot and discarded it.  The wound  bed is now clean, there is no erythema of the toe, and there is no  drainage.  The wound is dressed with a Band-Aid.  I will go ahead and  discharge the patient from followup at this time.  Followup will be  p.r.n.      Leonie Man, M.D.  Electronically Signed     PB/MEDQ  D:  07/14/2009  T:  07/15/2009  Job:  [id number]

## 2011-05-04 NOTE — Assessment & Plan Note (Signed)
OFFICE VISIT   Stanley [name], Stanley [name]  DOB:  15-Dec-1925                                        October 01, 2010  CHART #:  [id number]   HISTORY:  The patient returns to the office today in followup after his  recent coronary artery bypass grafting and aortic valve replacement with  a 25-mm pericardial tissue valve done on August 17, 2010.  Considering  his difficulty with his knees, he seems to be making reasonable progress  postoperatively.  He is currently involved in the cardiac rehab program  and is getting around reasonably well without any symptoms of congestive  heart failure or angina.   PHYSICAL EXAMINATION:  His blood pressure is 109/63, pulse 58,  respiratory rate is 18, and O2 sats 97%.  His sternum is stable and well  healed.  Lungs are clear.  Cardiac exam reveals regular rate and rhythm.  I do not appreciate any murmur of aortic insufficiency.  His previous  murmur of aortic stenosis is not present.   MEDICATIONS:  He continues on:  1. Glyburide XL 10 mg 2 a day.  2. Actos 30 mg a day.  3. Aspirin 325 mg a day.  4. Janumet 50/500 twice a day.  5. Crestor 10 mg a day.  6. Zetia 10 mg a day.  7. Diclofenac 75 twice a day.  8. Lopressor 12.5 b.i.d.  9. Enablex 50 a day.  10.Nu-Iron 150 a day.   DIAGNOSTIC TESTS:  He did not have a chest x-ray today.  Three weeks  ago, he had excellent postoperative chest x-ray without evidence of  effusion.   IMPRESSION:  Overall, considering his limitations secondary to his joint  problems and age, he is making excellent progress postoperatively and  actively involved in the cardiac rehab program.  I have encouraged him  to complete the entire 12-week program and then consider whether he  wishes to return to work or not.  He notes that he has a cataract in the  left eye that he would like to have removed in the near future.  I have  suggested to him we wait 2-3 months postoperatively as it would be much  less risk at this point from stopping his aspirin for a period of time  with the newly implanted valve.  He is agreeable with this approach.  I  plan to see him back in 3 months.   Sheliah Plane, MD  Electronically Signed   EG/MEDQ  D:  10/01/2010  T:  10/02/2010  Job:  [id number]   cc:   [name], MD  [name], MD, [facility]

## 2011-05-04 NOTE — Assessment & Plan Note (Signed)
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  He denies any angina, dyspnea  on exertion, or syncope.  He is riding his stationary bike and is doing  very well.   He was having some aches and pains in his upper shoulders and arms.  He  stopped his Zetia and Crestor which got better.  His numbers; however,  off of his lipid lowering drug showed a total cholesterol of 269,  triglycerides of 368, HDL 48 and LDL 159.7.  The rest of his blood work  was within normal limits except for hemoglobin A1c of 7%.  TSH  specifically was normal as was his comprehensive metabolic panel.   PROBLEM LIST:  1. Multiple cardiac risk factors with a negative stress Myoview,      October 28, 2006.  Again, he is asymptomatic with activity.  2. Hypertension.  3. Type 2 diabetes.  4. Hyperlipidemia.  5. Mild aortic valve stenosis.  His last 2D echocardiogram, December 22, 2007, demonstrated a mean gradient of 20 up from 18 on the previous      echo.  His EF is 60%.  6. Nonobstructive carotid disease.  Last carotid Dopplers were December 22, 2007.  He had antegrade flow in both vertebrals.   CURRENT MEDICATIONS:  1. Benicar 20 mg per day.  2. Aspirin 81 mg a day.  3. Flomax 0.4 mg per day.  4. Avodart 0.4 one daily.  5. Diclofenac 75 mg p.o. b.i.d.  6. Glipizide extended release 10 mg p.o. b.i.d.  7. Janumet 50/50 b.i.d.  8. Actos 30 mg per day.   PHYSICAL EXAMINATION:  VITAL SIGNS:  His blood pressure today is 110/60,  his pulse is 63 and regular, weight is stable at 207.  HEENT:  Unchanged and normal.  NECK:  Supple.  Carotid upstrokes were equal bilaterally without  significant bruit.  Thyroid is not enlarged.  Trachea is midline.  LUNGS:  Clear to  auscultation and percussion.  HEART:  Nondisplaced PMI.  He has a soft systolic murmur consistent with  aortic stenosis on left sternal border.  S2 splits.  No diastolic  component was appreciated.  ABDOMEN:  Soft, good bowel sounds.  No midline bruits.  No pulsatile  mass.  No hepatomegaly.  EXTREMITIES:  No cyanosis, clubbing, or edema.  Pulses were present, but  reduced.  MUSCULOSKELETAL:  He wears a right knee brace.  SKIN:  Few ecchymoses.   His electrocardiogram is essentially normal.   I had a long talk with Stanley [name] today.  I have recommended him to go back on  Crestor 10 mg p.o. daily and hopes that this will not cause as much  myalgia.  We will repeat his lipids and LFTs in 6 weeks.  If he does not  tolerate this, I would try pravastatin  at 40 mg.   He will let me know if he has any symptoms such as angina, dyspnea on  exertion, or syncope.  Otherwise, I will see him back in a year.     Thomas C. Daleen Squibb, MD, [facility]  Electronically Signed    TCW/MedQ  DD: 12/30/2008  DT: 12/31/2008  Job #: [id number]

## 2011-05-04 NOTE — Assessment & Plan Note (Signed)
OFFICE VISIT   KALEB, SEK  DOB:  [birth year]                                        September 14, 2010  CHART #:  [id number]   HISTORY:  The patient comes in today for a 3-week postoperative  followup.  He is status post aortic valve replacement with an Edwards  pericardial tissue valve and coronary artery bypass grafting x2 on  August 17, 2010.  His postoperative course was relatively uneventful and  he was able to be discharged home on August 22, 2010.  Since his  discharge home, he has done fairly well.  He has had some hypotension  with blood pressures running in the low 90s with some associated  dizziness.  He was seen by Dr. Daleen Squibb last week and his metoprolol was  discontinued.  He has noticed since that time, however, that he has been  tachycardic and has since restarted the metoprolol 12.5 mg b.i.d.  Since  that time, he states that his blood pressures at home have been more  stable, running in one teens systolic range with heart rates in the 80s,  which is about his baseline according to the patient.  He has had no  further dizziness.  He is ambulating frequently and is progressing.  His  appetite has improved.  He still gets weak at times and has some  soreness in his chest, but overall is doing well.   PHYSICAL EXAMINATION:  Vital Signs:  Blood pressure is 104/64, pulse is  77, respirations 18, and O2 sat 95% on room air.  Chest:  His sternal  and left leg EVH sites have all healed well.  The sternum is stable to  palpation.  Heart:  Regular rate and rhythm without murmurs, rubs, or  gallops.  Lungs:  Clear to auscultation.   DIAGNOSTIC TESTS:  Chest x-ray shows resolving atelectasis and almost  completely resolved effusions.   ASSESSMENT/PLAN:  The patient is progressing nicely status post aortic  valve replacement-coronary artery bypass graft.  He is to start cardiac  rehab later this week.  He has been off his Janumet since  surgery, but  his blood sugars have been stable.  He has scheduled an appointment to  see his primary care physician, Dr. Clent Ridges with reference to restarting  this medication.  He seems to be tolerating the reinitiation of his beta-  blocker without problem, although I have encouraged him to make Dr.  Vern Claude office aware that he did resume this medication and for the  patient to closely follow his blood pressures and heart rate.  He is  instructed to notify the cardiologist's office if he experiences any  dizziness or notes any changes in his heart rate or blood pressure.  We  will plan to see him back in 2 weeks with Dr. Tyrone Sage for final  followup.  At this point, he may begin to drive and increase his  activity accordingly.  He will call in the interim if he experiences any  problems or has any further questions.   Coral Ceo, P.A.   GC/MEDQ  D:  09/14/2010  T:  09/15/2010  Job:  [id number]   cc:   Thomas C. Wall, MD, [facility]

## 2011-05-04 NOTE — Assessment & Plan Note (Signed)
OFFICE VISIT   Stanley, [name]  DOB:  [birth year]                                        November 26, 2010  CHART #:  [id number]   The patient returns to the office today in followup following  replacement of aortic valve with a 25-mm Edwards pericardial valve and  coronary artery bypass grafting x2 which was done on August 29.  The  patient on previous operation had developed staph infection in his  knees.  At this point, after his cardiac surgery, he is doing well.  He  is involved in cardiac rehab and returned to working half a day last  week.  He notes that overall his functional status has improved though  he still limited by his orthopedic problems.  He denies angina or  shortness of breath or evidence of heart failure.  He is currently  involved in the cardiac rehab program 3 days a week.   He continues on glyburide, Actos, aspirin 81 mg a day, Benicar, Janumet  50/500, Crestor, Zetia, diclofenac.  His hemoglobin was slightly low and  he was placed back on Nu-Iron 150 a day.  Enablex 50 mg a day.   On physical exam his blood pressure 131/72, pulse 76, respiratory rate  is 18, O2 saturations 94%.  His sternum is stable and well healed.  Valve sounds are crisp, but I do not appreciate any murmur of aortic  insufficiency.  He has no pedal edema.  The ulcer that developed over  his distal right thigh from his orthopedic bracing continues to heal and  is much smaller and does not appear to be infected.  He continues to  treat this locally.   Overall, I am very pleased with his progress and he seems very motivated  to continue in the cardiac rehab program.  I have not made him a  specific appointment to see me in followup, but would be glad to see him  at his or Dr. Vern [name]  request at anytime.   Stanley Plane, MD  Electronically Signed   EG/MEDQ  D:  11/26/2010  T:  11/27/2010  Job:  [id number]   cc:   Stanley Senior A. Clent Ridges, MD  Stanley Sans. Wall, MD, [facility]

## 2011-05-04 NOTE — Consult Note (Signed)
NEW PATIENT CONSULTATION   Stanley [name], Stanley [name]  DOB:  1925/07/03                                        June 25, 2010  CHART #:  [id number]   PRIMARY CARE PHYSICIAN:  Tera Mater. Clent Ridges, MD   REASON FOR CONSULTATION:  Coronary artery disease and aortic stenosis.   HISTORY:  The patient is referred by Dr. Juanito Doom for consideration of  aortic valve replacement and coronary artery bypass grafting.  The  patient is an 77 year old.  He currently works part-time at the [employer].  He is known that he has had a cardiac murmur since  2000 and has had serial echocardiograms.  I do not have the reports on  all of these, but most recent echocardiogram done at the [facility] office  that showed a peak velocity across the aortic valve of 4 m/sec, which  led to referral to Dr. Riley Kill for cardiac catheterization, this was  done last week.  The patient was found to have coronary artery disease  and was referred for consideration of coronary artery bypass grafting  and aortic valve replacement.  The patient denies any symptoms, he  insists that he is asymptomatic.  He denies syncope, denies angina.  He  continues to work 3 days a week, 12 hours at a time.  He does note that  after working all day, he is somewhat more fatigued and he notes that  his wife tells him he appears more tired.  He does concede that because  of the limitations of his right knee replacement, he is somewhat limited  in heavy physical exercise.   PAST MEDICAL HISTORY:  Denies any previous myocardial infarction,  angioplasty, or previous cardiac surgery.  He denies hypertension.  He  does have known hyperlipidemia, is on Crestor.  Denies diabetes.  He is  a nonsmoker.  He has had no previous stroke.  Denies claudication.  Denies renal insufficiency.  His past medical history is significant for  aortic stenosis as noted above, type 2 diabetes x15 years, history of  sleep apnea, he notes that he  does use a CPAP machine primarily for  snoring, history of hypothyroidism.  The patient's electronic medical  history notes anemia, however, the patient's hematocrit is 39%.  Also,  notes carotid artery disease, which recent echocardiogram notes really  no significant carotid disease.  He has mild smooth plaque in his  carotid bulbs with less than 0-39% stenoses.  At this point, I would  consider him not having carotid artery disease.   PAST SURGICAL HISTORY:  Significant.  He has had 2 knee replacements in  the right knee, 1 on the left.  First, initial right knee replacement  developed MRSA staph infection and required replacement.  Second knee  replacement also had MRSA infection, was treated with long-term  antibiotics including gentamicin, which causes toxicity to his ears and  has had equilibrium problems since.  He also has a history of  tonsillectomy and prostate surgery in the past.   SOCIAL HISTORY:  The patient is married, occasional alcohol use.  Denies  drug use.  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  He smoked in  the distant past, quit in 1980.   FAMILY HISTORY:  The patient's father died at 72 of blood clots, mother  died at  80 of lymphoma.   REVIEW OF SYSTEMS:  CARDIAC:  The patient denies chest pain, resting  shortness of breath, exertional shortness of breath, syncope,  presyncope, or orthopnea.  Denies lower extremity edema.  Denies  palpitations.  GENERAL:  He denies constitutional symptoms.  Dentally, he sees his  dentist twice a year.  He has extensive crowns, but no active dental  disease.  As noted above, he has some balance issues with vertigo.  He  attributes this to gentamicin toxicity.  Denies any history of GI bleed.  Has had several colonoscopies done, most recent one being clear.  He has  had some difficulty in healing the right second toe in the past after a  minor injury, this has now completely healed.   PHYSICAL EXAMINATION:  Vital  Signs:  His blood pressure 101/59, pulse is  70, respiratory rate is 18, and O2 sats 95%.  He is 68 inches tall, 201  pounds, BMI is 30.67.  General:  He is awake, alert, and neurologically  intact.  He has no carotid bruits.  No cervical adenopathy.  Cardiac:  Harsh holosystolic murmur heard throughout the precordium.  I do not  appreciate any murmur of aortic insufficiency.  Lower Extremities:  The  patient has incision over the left knee that is well healed and appears  to be a functioning prosthesis.  On the right leg, he has a brace.  Just  above the knee, there is an ulcerated breakdown of size of a quarter  with no active pus, but with full thickness skin disruption.  This  appears to be pressures related due to the strap that hold his brace in  place.  Cultures of this area were obtained.  It appears to have  adequate vein for bypass in the left leg.   DIAGNOSTIC TESTS:  Echocardiogram results from Dr. Vern Claude office are  reviewed as is the recent cardiac catheterization.  He has mild LVH, 60%  ejection fraction, no wall motion abnormalities, estimated peak gradient  of 63 mm, mean gradient of 40 with a velocity across the aortic valve of  397 cm/sec, mildly increased PA pressures, and mild mitral  regurgitation.  Cardiac catheterization shows extensive calcification of  both the aortic valve and the coronary arteries.  There is 80% LAD  lesion and 70% right coronary artery lesion.   IMPRESSION:  1. Critical aortic stenosis, it appears asymptomatic, greater than 397      cm/sec velocity.  2. Coronary artery disease, primarily involving the left anterior      descending coronary artery and right coronary artery.  3. History of diabetes x15 years.  4. History of hyperlipidemia.  5. History of sleep apnea.  6. History of hypothyroidism.  The patient does not have evidence of      carotid artery disease and denies hypertension.  7. Open ulcer in the right leg just above the knee,  in the knee that      has previously been infected with methicillin-resistant      Staphylococcus aureus in the past.  Cultures of this area, although      not purulent at the time of exam were obtained and we will follow      up on this.   I have discussed in detail with the patient today the critical aortic  stenosis and coronary artery disease.  With the combination of both, I  recommended to him consideration of coronary artery bypass grafting and  aortic valve replacement  with a tissue valve.  He is willing to proceed  with this.  I have reviewed with him the risks and options particularly  the risk of death, infection, stroke, myocardial infarction, and  bleeding.  He would like to wait until August before proceeding with  surgery as it will limit his strenuous activity, and should he have any  symptoms, notify us immediately.  In the meantime, we will follow up the  cultures of his leg to ensure that there is not any subclinical MRSA  infection that may infect the aortic valve or his knee prosthesis.   Sheliah Plane, MD  Electronically Signed   EG/MEDQ  D:  06/25/2010  T:  06/26/2010  Job:  [id number]   cc:   Thomas C. Daleen Squibb, MD, [facility]  [name], MD

## 2011-05-04 NOTE — Assessment & Plan Note (Signed)
[facility]   NAME:  Stanley [name], Stanley [name]             ACCOUNT NO.:  [id number]   MEDICAL RECORD NO.:  [id number]      DATE OF BIRTH:  1925/05/31   PHYSICIAN:  Leonie Man, M.D.    VISIT DATE:  06/30/2009                                   OFFICE VISIT   PROBLEM:  Nonhealing wound third toe right foot.   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  He self-treated himself.  He self-treated  with doxycycline, mupirocin and triple antibiotic ointment but continues  to have pain and erythema in the toe.  There were no x-rays done at that  time but he felt there was no bone pain and consequently decided there  were no fractures.   Because of the nonhealing nature of this wound, the patient is self  referred for reevaluation and treatment at the wound care center.   PAST MEDICAL HISTORY:  The patient is allergic to ACE INHIBITORS.   MEDICATIONS:  1. Glipizide 10 mg b.i.d.  2. Actos 45 mg daily.  3. Flomax 0.4 mg daily.  4. Zetia 10 mg daily.  5. Crestor 10 mg daily.  6. Avodart  ? mg daily.  7. Aspirin 81 mg daily.  8. Diclofenac 75 mg daily.  9. Janumet 5/500 twice daily.  10.Benicar 20 mg daily.   PAST SURGICAL HISTORY:  Bilateral knee replacements right rotator cuff  repair.   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  No  illicit drug use history.   REVIEW OF SYSTEMS:  CARDIOPULMONARY:  Positive for history of a cardiac  murmur and hypertension.  ENDOCRINE:  Diabetes type 2 A1c stable at 6.9.  GASTROINTESTINAL:  Negative.  GENITOURINARY:  Positive for BPH.  MUSCULOSKELETAL:  Unstable right knee status post total knee replacement  which was complicated by MRSA infection in the past.  NEUROPSYCHIATRIC:  Negative.   PHYSICAL EXAMINATION:   VITAL SIGNS:  Temperature 97.8, pulse 71,  respirations 18, blood pressure 100/60.  Capillary blood glucose is 110.  HEENT:  Head normocephalic.  Pupils are round, no scleral icterus.  Oropharynx is benign.  NECK:  Supple.  No thyromegaly.  No adenopathy.  CHEST:  Clear to auscultation bilaterally.  HEART:  Regular rate and rhythm.  There is a 3/6 murmur at the base of  the heart consistent with a murmur of aortic stenosis.  ABDOMEN:  Normoactive bowel sounds.  No masses or visceromegaly  palpable.  EXTREMITIES:  Range of motion decreased in the right knee.  Both lower  extremities were warm and pink; however, pulses are nonpalpable.  Not  palpable on the right dorsalis pedis or posterior tibial.  Left ankle  brachial index is 1.0, right ankle brachial index is unobtainable.   The right third toe is tender and swollen with partial nail avulsion.  No drainage or odor.   ASSESSMENT:  Nail bed infection in a patient with diabetes mellitus and  peripheral vascular disease.   DISPOSITION:  Culture is taken of the toe.  He is on empiric mupirocin  t.i.d.  Bilateral  duplex arterial Dopplers have been ordered and follow  up is in 1 week.      Leonie Man, M.D.  Electronically Signed     PB/MEDQ  D:  06/30/2009  T:  07/01/2009  Job:  [id number]

## 2011-05-04 NOTE — Assessment & Plan Note (Signed)
[facility]   NAME:  Stanley [name], Stanley [name] NO.:  [id number]   MEDICAL RECORD NO.:  [id number]      DATE OF BIRTH:  Dec 09, 1925   PHYSICIAN:  Leonie Man, M.D.         VISIT DATE:                                   OFFICE VISIT   PROBLEM:  Nonhealing wound of the third toe of the right foot.   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  He was seen last in the  clinic on June 30, 2009, at which time he complained of red toe, but  without any significant drainage.  On examination, the toe was indeed  avulsing a nail and there was a residual nail in the nail bed.  Culture  of this area have grown out methicillin-resistant staph aureus sensitive  to tetracycline.  The patient was placed back on tetracycline 100 mg  b.i.d. and is currently on it.  He was treated at that time with  mupirocin topically for the toe.  Stanley [name] returns for today for  evaluation.   VITAL SIGNS:  His temperature is 97.8, pulse 62, blood pressure 111/72.  Capillary blood glucose is 105.   The patient notes that the toe is less painful, but still remains quite  red.  I debrided the residual nail from the nailbed.  There was some  fairly risk of venous bleeding, which was controlled with Gelfoam.  I  reviewed with him his noninvasive vascular evaluation of his lower leg.  The patient did have ankle-brachial indices for the right anterior  tibial of 1.2, right posterior tibial of 1, and the right first foot of  0.2, left anterior tibial 1.4, and left posterior tibial 1.6, left first  toe 0.2.  All the wave forms were dampened and monophasic indicative of  a fairly decreased distal blood flow and consistent with diabetes.  The  microvascular study showed some occlusive disease in one of the  trifurcated vessels of the distal right foot.   PROCEDURE:  I removed the remaining portion of the nail in the  nailbed  on the right third toe and controlled bleeding with Gelfoam.  Mupirocin  was placed on the toe and a sterile dressing with a toe sock placed over  the toe.  I will follow up with Stanley [name] in 1 week.  He is asked to  continue his doxycycline 100 mg b.i.d. until complete.      Leonie Man, M.D.  Electronically Signed     PB/MEDQ  D:  07/07/2009  T:  07/08/2009  Job:  [id number]

## 2011-05-04 NOTE — Assessment & Plan Note (Signed)
OFFICE VISIT   Stanley [name], Stanley [name]  DOB:  1925-01-30                                        July 30, 2010  CHART #:  [id number]   ADDENDUM   HISTORY:  The patient returns to the office today to further discuss his  recommended coronary artery bypass grafting and aortic valve  replacement.  He continues to remain asymptomatic.  He denies any chest  pain, denies syncope or presyncope, denies obvious symptoms of  congestive heart failure.  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  He has been seeing Dr.  Lurene Shadow at the [facility] for the area of breakdown related to his  right knee brace on the anterior aspect of his right calf just above the  knee.  When this was cultured in our office a month ago, it grew staph  species, not negative for MRSA.  He notes that further cultures have  been done that suggested MRSA and he had been on doxycycline 100 mg  twice a day for the past month and continues with local wound care.  He  notes that the area on his right knee was debrided by Dr. Lurene Shadow and now  is slowly granulating in, but because of its location right on the  strap, it would be difficult to heal completely.   PHYSICAL EXAMINATION:  His blood pressure is 107/66, pulse is 88,  respiratory rate 16, and O2 sats 96%.  His lungs are clear.  He has  unchanged murmur consistent with aortic stenosis.  He has no pedal  edema.  The area on his right knee appears just as it did 1 month ago  when I saw him with chronic granulation tissue, but does not appear with  any active cellulitis around the area.   MEDICATIONS:  He continues on glyburide, Actos, aspirin 81 mg, Benicar,  Janumet 5/500, Crestor, Zetia, Januvia.  He also continues on Voltaren  75 mg a day.   IMPRESSION:  I have again discussed with him because of his significant  left  anterior descending coronary artery disease and to a lesser extent  his right coronary disease and critical aortic stenosis with aortic  valve velocity of 4 m/sec proceeding with aortic valve replacement and  coronary artery bypass grafting.  He had originally set this up for  Monday, but has now asked to change it to Monday, August 17, 2010, and  we made arrangements to do this.  He will continue on his doxycycline  100 mg twice a day until surgery.  He will stop his Voltaren now and 48  hours before surgery stop his ARB and metformin.   The risks of surgery, specifically infection, but also stroke,  myocardial infarction, bleeding, and blood transfusion have all been  discussed with the patient in detail and he is willing to proceed.  Because of the issue with his right leg, we will plan to harvest a vein  needed from the left leg.   Sheliah Plane, MD  Electronically Signed   EG/MEDQ  D:  07/30/2010  T:  07/30/2010  Job:  [id number]   cc:   Thomas C. Daleen Squibb, MD, [facility]  Jeannett Senior A.  Sarajane Jews, MD

## 2011-05-04 NOTE — Assessment & Plan Note (Signed)
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   PROBLEM LIST:  1. Hypertension.  2. Type 2 diabetes.  3. Mixed hyperlipidemia.  4. Nonobstructive carotid disease, asymptomatic.  5. Mild aortic stenosis, last echo December 31, 2005.   He has had a negative stress Myoview on October 28, 2006.   He is currently asymptomatic, having no symptoms of angina, dyspnea on  exertion, presyncope, syncope, palpitations, orthopnea, PND or  peripheral edema.  He has lost about 9 pounds.  He looks remarkably  good.  He just celebrated his 82th birthday.  They gave him a surprise  party.   CURRENT MEDICATIONS:  1. Benicar  20 mg a day.  2. Crestor 10 mg a day.  3. Actos 45 mg a day.  4. Aspirin 81 mg a day.  5. Flomax 0.4 mg a day.  6. Zetia 10 mg a day.  7. Avodart one daily.  8. Diclofenac 75 mg p.o. b.i.d.  9. Glipizide extended release 10 mg p.o. b.i.d.   His exam today shows a blood pressure of 122/70, his pulse is 86 and  regular.  His weight is 206 pounds, down 7 on my scales.  HEENT:  Normocephalic, atraumatic.  PERRL.  Extraocular movements  intact.  Sclerae are clear.  Facial symmetry is normal.  Carotid upstrokes are equal bilaterally with soft systolic sounds versus  bruits.  Thyroid is not enlarged.  Trachea is midline.  LUNGS:  Clear.  HEART:  A nondisplaced PMI.  He has a soft systolic murmur and S2  splits, but it is hard to hear.  ABDOMEN:  Soft, good bowel sounds.  No midline bruit.  No hepatomegaly,  no organomegaly.  EXTREMITIES:  No cyanosis, clubbing or edema.  Pulses are intact.  He is  wearing a brace on his right leg.  NEUROLOGIC:  Otherwise unremarkable.   He brought laboratory  data today.  His total cholesterol is 178, LDL is  89, which is 10 points down from last year, HDL is up to 45.6 from 42.  Triglycerides were elevated at 293.  VLDL is high at 59.  His CBC was  normal except for a hemoglobin of 17, he thinks he was dehydrated that  morning.  His electrolytes were stable.  LFTs were normal.  TSH is  normal.  PSA was within normal range and actually lower than last year.  Hemoglobin A1c was 7.8%, which is high and higher the last year.  His  microalbumin was elevated at 8.7 mg/dL.   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  I am a little concerned about  his hemoglobin A1c, not to mention his triglycerides and VLDL.  There  has clearly been significant dietary indiscretion, which he admits to.   I have made no changes in his medical program.  He  is due a surveillance  carotid Doppler as well as a 2-D echo.  Assuming these are stable, I  will see him back in a year.     Thomas C. Daleen Squibb, MD, [facility]  Electronically Signed    TCW/MedQ  DD: 12/11/2007  DT: 12/12/2007  Job #: [id number]

## 2011-05-07 ENCOUNTER — Encounter (HOSPITAL_COMMUNITY): Payer: Medicare Other | Attending: Family Medicine

## 2011-05-07 DIAGNOSIS — D518 Other vitamin B12 deficiency anemias: Secondary | ICD-10-CM | POA: Insufficient documentation

## 2011-05-07 DIAGNOSIS — D509 Iron deficiency anemia, unspecified: Secondary | ICD-10-CM | POA: Insufficient documentation

## 2011-05-07 NOTE — Assessment & Plan Note (Signed)
[facility]                              CARDIOLOGY OFFICE NOTE   EAGLE, PITTA                    MRN:          [id number]  DATE:10/07/2006                            DOB:          [birth year]    REASON FOR VISIT:  Stanley [name] comes in today for followup of the following issues:  1. Nonobstructive carotid artery disease, asymptomatic.  Carotid Dopplers      12/31/05 showed nonobstructive disease with antegrade flow in both      vertebrals.  2. Hyperlipidemia.  Lipids are near goal except for an LDL of 99.5.  He      had been off Crestor for a short period of time because of myalgias but      started it back about 5-6 weeks ago.  His HDL is 42.5.  3. Hypertension, under good control.  4. Mild aortic stenosis by 2D echocardiogram December 31, 2005.   NARRATIVE:  He is totally asymptomatic.  No dyspnea on exertion.  No chest  discomfort.  No angina.  No syncope.  He is getting ready to take a 2-week  trip to [place] with Fannie Knee, his wife.   He is due a stress Myoview in my opinion, the last being in 2004.  We will  arrange for this when he returns.   MEDICATIONS:  1. Benicar 20 mg daily.  2. Crestor 10 mg daily.  3. Aspirin 81 mg daily.  4. Flomax 0.4 mg daily.  5. Zetia 10 mg daily.  6. Avodart 1 daily.  7. Diclofenac 75 mg b.i.d.  8. Glipizide 10 mg b.i.d.   I did note that he has non-insulin-dependent diabetes, and his hemoglobin  A1C was 7 recently.   PHYSICAL EXAMINATION:  GENERAL:  He is in no acute distress.  VITAL SIGNS:  Blood pressure 118/72, pulse 65 and regular, weight 213, down  2.  NECK:  Carotids are full without delay.  No bruits.  Thyroid is not  enlarged.  Trachea is midline.  LUNGS:  Clear.  HEART:  Systolic murmur consistent with mild aortic stenosis.  S2 splits.  There is no gallop.  ABDOMEN:  Soft.  There is no midline bruit.  EXTREMITIES:  No edema.  Pulses are palpable but reduced.  NEUROLOGIC:  Intact.   ELECTROCARDIOGRAM:  Electrocardiogram is normal.   ASSESSMENT/PLAN:  Stanley [name] is doing well.  He knows he needs to tighten up on  his LDL, hopefully down to the 70s where it has been in the past.  In  addition, he needs to tighten up on his blood sugar control.   We will arrange for him to have an adenosine Myoview when he returns from  [place].  I will see him back in a year.     Thomas C. Daleen Squibb, MD, [facility]    TCW/MedQ  DD: 10/07/2006  DT: 10/09/2006  Job #: [id number]   cc:   Genene Churn. Love, M.D.

## 2011-05-07 NOTE — Op Note (Signed)
[facility]  Patient:    Stanley [name], Stanley [name]                  MRN: [id number] Proc. Date: 11/09/00 Adm. Date:  [id number] Attending:  Ollen Gross V                           Operative Report  PREOPERATIVE DIAGNOSIS:  Osteoarthritis, left knee.  POSTOPERATIVE DIAGNOSIS:  Osteoarthritis, left knee.  OPERATION:  Left total knee arthroplasty.  SURGEON:  Ollen Gross, M.D.  ASSISTANT:  [name]. [name], P.A.  ANESTHESIA:  General.  ESTIMATED BLOOD LOSS:  General.  DRAINS:  Hemovac x 1.  COMPLICATIONS:  None.  TOURNIQUET TIME:  68 minutes at 350 mmHg.  DISPOSITION:  Patient stable to recovery.  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  He has had pain refractory to nonoperative management including injections.  He presents now for left total knee arthroplasty.  DESCRIPTION OF PROCEDURE:  After the successful administration of general anesthetic, a tourniquet was placed high on the left thigh and left lower extremity prepped and draped in the usual sterile fashion.  Extremities were wrapped in an Esmarch.  The knee was flexed and tourniquet inflated to 350 mmHg.  Skin incisions were made with the 10 blade and subcutaneous tissue down to the extensor mechanism.  A fresh blade was used to make a lateral parapatellar arthrotomy given his large valgus deformity.  Soft tissue of the proximal lateral tibia was then subperiosteally elevated to the joint line with a knife and around to the lateral tibia.  Soft tissue over the proximal medial tibia was also elevated with attention being paid to avoid the patellar tendon and patellar tubercle.  The patella was then everted medially and the knee flexed 90 degrees.  The tubercle was then packed.  At this point, the knee was inspected and he had severe bone-on-bone changes in the lateral compartment as well  as significant changes in the medial compartment.  The ACL and PCL were then removed and synovectomy was performed as he had a tremendous amount of synovitis.  A drill was then used to create a hole in the distal femur and the canal was irrigated.  The 5 degree left valgus alignment guide was then placed intramedullary and _____ at the posterior condyle.  The block was pinned so as to remove 9 mm off the distal femur.  Distal femur resection is made with an oscillating saw.  This did lead to removal of bone on the medial side but laterally there was no bone removed thus the +4 positions utilized and 4 mm more proximal bone is encountered.  A 4 mm distal lateral wedge is thus necessary.  The sizing block is placed and sized for the size most appropriate.  Given his extensive valgus deformity, it was decided to do the tibia at this time and then mark our external rotation for the femoral cut off based on the tibial cut.  Tibia was subsequently subluxed forward and the menisci removed.  He had a large defect laterally.  The tibial alignment guide is placed.  In referencing proximally at the medial aspect of the tibial tubercle and distally along the tibial crest and second metatarsal axis, the block was subsequently pinned so that we removed 10 mm off of the slightly deficient medial side.  This led  to resection of 2 mm off the bottom of the defect laterally.  Resection was subsequently made with an oscillating saw.  Size 4 was the most appropriate tibial component.  The 12.5 mm spacer block was placed and the anterior posterior block placed on the femur.  Intermedullary rod was also placed on the femoral side to hold the block in place.  Rotation is then marked with the knee flexed 90 degrees.  This did correspond with an epicondylar axis.  The anterior posterior cuts were then made for a size 4.  Chamfer block is placed and the intercondylar chamfer cuts made for size 4. Size 4 femoral  trial was then placed with a 4 mm distal lateral augment.  The size 4 tibial tray was placed and a 12.5 mm spacer was utilized first.  He had full extension and excellent varus and valgus balance down past 100 degrees of flexion.  The rotating platform prosthesis is utilized.  The patella was then everted, _____ was supposed to be 23 and free hand resection taken down to 14 mm.  A 38 mm template is placed and the lug holes are drilled.  A 38 mm trial patella is placed and it tracks perfectly.  The osteophytes are removed off the posterior femur with the femoral component in place.  All of the trials were then removed and the cut bone surfaces are pulsatile lavaged.  The cement was mixed and once we were ready for implantation, the size 4 mobile bearing tibial tray with a size 4 posterior stabilized femur and a 38 mm patella were also placed.  Trial 12.5 insert was placed and the knee held in full extension.  All extruded cement was removed. Once the cement was fully hardened, a permanent 12.5 mm posterior stabilizer platform insert is inserted into the tibial tray and the knee reduced.  Once again, he had excellent balance throughout.  The wound was then copiously irrigated with antibiotic solution and tourniquet released after a total time of 68 minutes.  Minor bleeding was stopped with cautery.  The arthrotomy was then closed with interrupted #1 PDS and a lateral release is left open approximately 3 cm.  The patella was tracking perfectly with flexion against gravity and I was able to get him to flex down to 130 degrees without any excess tension on the suture line.  Subcu was then closed with interrupted 2-0 Vicryl, subcuticular closed with running 4-0 Monocryl. Incisions were clean and dry.  Steri-Strips and a bulky sterile dressing applied.  Drains hooked to suction, and he was placed into a knee immobilizer, awakened and transported to recovery in stable condition. DD:   11/09/00 TD:  11/10/00 Job: [id number]

## 2011-05-07 NOTE — Discharge Summary (Signed)
[facility]  Patient:    Stanley [name], Stanley [name]                  MRN: [id number] Adm. Date:  [id number] Disc. Date: [id number] Attending:  Loanne Drilling Dictator:   Alexzandrew L. Perkins, P.A.-C.                           Discharge Summary  ADMITTING DIAGNOSES: 1. Severe osteoarthritis, left knee. 2. Hypertension. 3. Non-insulin-dependent diabetes mellitus. 4. Status post right total knee replacement arthroplasty. 5. Status post patellar tendon repair, right knee. 6. History of methicillin resistant Staphylococcus aureus infection, right    knee.  DISCHARGE DIAGNOSES: 1. Osteoarthritis, left knee status post left total knee replacement    arthroplasty. 2. Postoperative hemorrhagic anemia. 3. Hypertension. 4. Non-insulin-dependent diabetes mellitus. 5. Status post right total knee replacement arthroplasty. 6. Status post patellar tendon repair, right knee. 7. History of methicillin resistant Staphylococcus aureus infection, right    knee.  PROCEDURE:  The patient was taken to the OR on November 09, 2000 and underwent a left total knee replacement arthroplasty.  SURGEON:  Ollen Gross, M.D.  ASSISTANT:  [name]. [name], P.A  ANESTHESIA:  General.  TOURNIQUET TIME:  68 minutes at 350 mmHg.  DRAIN:  One placed at the time of surgery.  BRIEF HISTORY:  The patient is a 77 year old  male with a history of severe osteoarthritis of the left knee with a 20 degree valgus deformity.  He has had pain refractory to nonoperative management including injections.  He was seen and evaluated by Dr. Lequita Halt and presents now for a left total knee replacement arthroplasty.  The risks and benefits of the procedure have been discussed and he is admitted to the hospital for surgery.  LABORATORY DATA:  CBC on admission showed hemoglobin 12.9, hematocrit 36.4, white cell count 8.4, red cell count 4.36.  Postoperative H&H was 9.8 and 28.5.  Hemoglobin  continued to drop down to 9.1 and 26.3.  Last noted H&H 9.3 and 26.8.  PT and PTT on admission were 12.5 and 29 respectively.  INR 1.0. Serial protimes were followed for Coumadin protocol.  Last note PT INR was 16.1 and 1.5 respectively.  Chemistry panel on admission showed elevated glucose of 203, elevated total protein 8.1, elevated ALT 149.  Follow-up BMET on November 10, 2000 showed a drop in sodium down to 132.  Glucose remained high at 210.  The remaining BMET was all within normal limits.  Urinalysis on admission was positive for glucose, positive for uric acid crystals, otherwise negative.  Blood group and type O positive.  Preoperative chest x-ray dated November 04, 2000 showed no active disease. EKG on the chart date July 01, 2000 showed normal sinus rhythm with occasional premature supraventricular complexes.  Abnormal EKG.  This was confirmed, though I am unable to read the signature.  There are initials on this EKG report.  HOSPITAL COURSE:  The patient was admitted to [facility] and taken to the OR.  He underwent the above stated procedure without complications. The patient tolerated the procedure well and was later transferred to the recovery room and then to the orthopedic floor for continued postoperative care.  Vital signs were followed.  The patient was placed on PC analgesics fro pain control following surgery.  He was also placed on vancomycin postoperative antibiotic protocol.  The patient was also started on physical therapy and occupational therapy for  gait training and ambulation.  Dressing changes were initiated on postoperative day #2.  The incision was healing well.  He only had minimal swelling postoperatively.  The patient progressed fairly well with physical therapy.  He was initially slow, and was only doing about 12 feet by postoperative day #3.  However, he did increase to greater than 50 feet by postoperative day #5.  He also continued with  range of motion using the CPM and active motion.  He was weaned off of his PC analgesics over to p.o. analgesics.  He was tolerating his medications very well and, by November 14, 2000, the patient was doing quite well, up ambulating independently.  It was decided that the patient could be discharged to home.  DISCHARGE PLAN:  The patient was discharged to home on November 14, 2000.  DISCHARGE DIAGNOSES:  Please see above.  DISCHARGE MEDICATIONS: 1. Percocet p.r.n. pain. 2. Robaxin p.r.n. spasm. 3. Coumadin as per pharmacy protocol. 4. He also is to continue his doxycycline and Rifampin postoperatively.  DIET:  Diabetic, low sodium diet.  FOLLOW UP:  The patient is to follow up early next week with Dr. Lequita Halt following discharge.  ACTIVITY:  Weight bearing as tolerated.  Home health PT.  Home health nursing. Will do home health therapy and will later transfer over to outpatient therapy after one week.  DISPOSITION:  Home.  CONDITION ON DISCHARGE:  Improved. DD:  12/19/00 TD:  12/19/00 Job: [id number]

## 2011-05-14 ENCOUNTER — Encounter (HOSPITAL_COMMUNITY): Payer: Medicare Other

## 2011-05-19 ENCOUNTER — Other Ambulatory Visit (INDEPENDENT_AMBULATORY_CARE_PROVIDER_SITE_OTHER): Payer: Medicare Other

## 2011-05-19 DIAGNOSIS — D649 Anemia, unspecified: Secondary | ICD-10-CM

## 2011-05-19 LAB — IRON: Iron: 55 ug/dL (ref 42–165)

## 2011-05-19 LAB — CBC WITH DIFFERENTIAL/PLATELET
Basophils Absolute: 0 10*3/uL (ref 0.0–0.1)
Eosinophils Relative: 2.9 % (ref 0.0–5.0)
HCT: 37.7 % — ABNORMAL LOW (ref 39.0–52.0)
Lymphocytes Relative: 10.3 % — ABNORMAL LOW (ref 12.0–46.0)
Monocytes Relative: 11.1 % (ref 3.0–12.0)
Neutrophils Relative %: 75.1 % (ref 43.0–77.0)
Platelets: 265 10*3/uL (ref 150.0–400.0)
WBC: 7.6 10*3/uL (ref 4.5–10.5)

## 2011-05-19 LAB — VITAMIN B12: Vitamin B-12: >1500 pg/mL — ABNORMAL HIGH (ref 211–911)

## 2011-06-18 ENCOUNTER — Other Ambulatory Visit (INDEPENDENT_AMBULATORY_CARE_PROVIDER_SITE_OTHER): Payer: Medicare Other

## 2011-06-18 DIAGNOSIS — D518 Other vitamin B12 deficiency anemias: Secondary | ICD-10-CM

## 2011-06-18 DIAGNOSIS — D519 Vitamin B12 deficiency anemia, unspecified: Secondary | ICD-10-CM

## 2011-06-18 LAB — FERRITIN: Ferritin: 25.8 ng/mL (ref 22.0–322.0)

## 2011-06-18 LAB — FOLATE: Folate: 13.6 ng/mL (ref >5.9)

## 2011-06-18 LAB — VITAMIN B12: Vitamin B-12: 820 pg/mL (ref 211–911)

## 2011-08-16 ENCOUNTER — Telehealth: Payer: Self-pay | Admitting: Family Medicine

## 2011-08-16 DIAGNOSIS — D649 Anemia, unspecified: Secondary | ICD-10-CM

## 2011-08-16 DIAGNOSIS — E119 Type 2 diabetes mellitus without complications: Secondary | ICD-10-CM

## 2011-08-16 NOTE — Telephone Encounter (Signed)
Put lab order in computer.

## 2011-08-18 ENCOUNTER — Other Ambulatory Visit (INDEPENDENT_AMBULATORY_CARE_PROVIDER_SITE_OTHER): Payer: Medicare Other

## 2011-08-18 DIAGNOSIS — I10 Essential (primary) hypertension: Secondary | ICD-10-CM

## 2011-08-18 DIAGNOSIS — D649 Anemia, unspecified: Secondary | ICD-10-CM

## 2011-08-18 DIAGNOSIS — E119 Type 2 diabetes mellitus without complications: Secondary | ICD-10-CM

## 2011-08-19 LAB — BASIC METABOLIC PANEL
BUN: 18 mg/dL (ref 6–23)
Creatinine, Ser: 0.9 mg/dL (ref 0.4–1.5)
GFR: 91.31 mL/min (ref >60.00)
Glucose, Bld: 106 mg/dL — ABNORMAL HIGH (ref 70–99)

## 2011-08-20 ENCOUNTER — Ambulatory Visit (INDEPENDENT_AMBULATORY_CARE_PROVIDER_SITE_OTHER): Payer: Medicare Other | Admitting: Cardiology

## 2011-08-20 ENCOUNTER — Encounter: Payer: Self-pay | Admitting: Cardiology

## 2011-08-20 VITALS — BP 112/62 | HR 68 | Ht 70.0 in | Wt 196.4 lb

## 2011-08-20 DIAGNOSIS — Z954 Presence of other heart-valve replacement: Secondary | ICD-10-CM

## 2011-08-20 DIAGNOSIS — I2510 Atherosclerotic heart disease of native coronary artery without angina pectoris: Secondary | ICD-10-CM

## 2011-08-20 DIAGNOSIS — I6529 Occlusion and stenosis of unspecified carotid artery: Secondary | ICD-10-CM

## 2011-08-20 DIAGNOSIS — I359 Nonrheumatic aortic valve disorder, unspecified: Secondary | ICD-10-CM

## 2011-08-20 NOTE — Assessment & Plan Note (Signed)
Stable. Repeat carotid Dopplers 2013.

## 2011-08-20 NOTE — Assessment & Plan Note (Signed)
Stable. No indication for echocardiography. 

## 2011-08-20 NOTE — Progress Notes (Signed)
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  The setting of angina or ischemic equivalence. He denies any presyncope or syncope.  Carotid Dopplers in 2011 showed nonobstructive disease. He is having no symptoms of TIAs or mini strokes.  EKG today is normal. His lab work is being followed by Dr. Abran Cantor. No past medical history on file.  No past surgical history on file.  No family history on file.  History   Social History  . Marital Status: Married    Spouse Name: N/A    Number of Children: N/A  . Years of Education: N/A   Occupational History  . Not on file.   Social History Main Topics  . Smoking status: Never Smoker   . Smokeless tobacco: Not on file  . Alcohol Use: Not on file  . Drug Use: Not on file  . Sexually Active: Not on file   Other Topics Concern  . Not on file   Social History Narrative  . No narrative on file    Allergies  Allergen Reactions  . Ace Inhibitors     Current Outpatient Prescriptions  Medication Sig Dispense Refill  . aspirin 81 MG tablet Take 81 mg by mouth daily.        . Calcium Carbonate-Vitamin D (CALCIUM-VITAMIN D) 500-200 MG-UNIT per tablet Take 1 tablet by mouth daily.        . diclofenac (VOLTAREN) 75 MG EC tablet Take 75 mg by mouth 2 (two) times daily.        Marland Kitchen dutasteride (AVODART) 0.5 MG capsule Take 0.5 mg by mouth daily.        Marland Kitchen esomeprazole (NEXIUM) 40 MG capsule Take 40 mg by mouth daily before breakfast.        . ezetimibe (ZETIA) 10 MG tablet Take 10 mg by mouth daily.        Marland Kitchen glipiZIDE (GLUCOTROL) 10 MG 24 hr tablet Take 20 mg by mouth daily.        Marland Kitchen glucose blood test strip 1 each by Other route as needed. Accu-Chek Avivia. Use as instructed       . Lancets (ACCU-CHEK SOFT TOUCH) lancets 1 each by Other route as needed. Use as instructed       . NON  FORMULARY CPAP       . pioglitazone (ACTOS) 30 MG tablet Take 30 mg by mouth daily.        . rosuvastatin (CRESTOR) 10 MG tablet Take 10 mg by mouth daily.        . sitaGLIPtan-metformin (JANUMET) 50-1000 MG per tablet Take 1 tablet by mouth 2 (two) times daily with a meal.        . solifenacin (VESICARE) 5 MG tablet Take 5 mg by mouth daily.        . Tamsulosin HCl (FLOMAX) 0.4 MG CAPS Take 0.4 mg by mouth daily.          ROS Negative other than HPI.   PE General Appearance: well developed, well nourished in no acute distress HEENT: symmetrical face, PERRLA, good dentition  Neck: no JVD, thyromegaly, or adenopathy, trachea midline Chest: symmetric without deformity Cardiac: PMI non-displaced, RRR, normal S1, S2, no gallop, soft systolic murmur,Lung: clear to ausculation and percussion Vascular: all pulses full without bruits  Abdominal: nondistended, nontender, good bowel sounds, no HSM, no bruits Extremities: no cyanosis, clubbing  or edema, no sign of DVT, no varicosities  Skin: normal color, no rashes Neuro: alert and oriented x 3, non-focal Pysch: normal affect Filed Vitals:   08/20/11 1138  BP: 112/62  Pulse: 68  Height: 5\' 10"  (1.778 m)  Weight: 196 lb 6.4 oz (89.086 kg)    EKG  Labs and Studies Reviewed.   Lab Results  Component Value Date   WBC 7.6 05/19/2011   HGB 12.3* 05/19/2011   HCT 37.7* 05/19/2011   MCV 79.0 05/19/2011   PLT 265.0 05/19/2011      Chemistry      Component Value Date/Time   NA 140 08/18/2011 1329   K 4.6 08/18/2011 1329   CL 104 08/18/2011 1329   CO2 26 08/18/2011 1329   BUN 18 08/18/2011 1329   CREATININE 0.9 08/18/2011 1329      Component Value Date/Time   CALCIUM 8.8 08/18/2011 1329   ALKPHOS 62 04/06/2011 1245   AST 18 04/06/2011 1245   ALT 17 04/06/2011 1245   BILITOT 0.3 04/06/2011 1245       Lab Results  Component Value Date   CHOL 150 04/06/2011   CHOL 190 11/24/2010   CHOL 122 02/25/2010   Lab Results  Component Value Date    HDL 48.80 04/06/2011   HDL 16.10 11/24/2010   HDL 96.04 02/25/2010   Lab Results  Component Value Date   LDLCALC 68 04/06/2011   LDLCALC 37 02/25/2010   Lab Results  Component Value Date   TRIG 167.0* 04/06/2011   TRIG 208.0* 11/24/2010   TRIG 182.0* 02/25/2010   Lab Results  Component Value Date   CHOLHDL 3 04/06/2011   CHOLHDL 3 11/24/2010   CHOLHDL 2 02/25/2010   Lab Results  Component Value Date   HGBA1C 7.8* 08/18/2011   Lab Results  Component Value Date   ALT 17 04/06/2011   AST 18 04/06/2011   ALKPHOS 62 04/06/2011   BILITOT 0.3 04/06/2011   Lab Results  Component Value Date   TSH 0.99 02/25/2010

## 2011-08-20 NOTE — Assessment & Plan Note (Signed)
Stable. No change in treatment. 

## 2011-08-20 NOTE — Patient Instructions (Signed)
Your physician recommends that you schedule a follow-up appointment in: 1 year with Dr. Wall  

## 2011-09-02 ENCOUNTER — Other Ambulatory Visit: Payer: Self-pay | Admitting: Family Medicine

## 2011-09-02 MED ORDER — SOLIFENACIN SUCCINATE 10 MG PO TABS: 10.0000 mg | ORAL_TABLET | Freq: Every day | ORAL | Status: DC

## 2011-09-02 NOTE — Telephone Encounter (Signed)
Refill request for Vesicare, script sent e-scribe

## 2011-09-06 ENCOUNTER — Encounter (HOSPITAL_BASED_OUTPATIENT_CLINIC_OR_DEPARTMENT_OTHER): Payer: Medicare Other | Attending: Internal Medicine

## 2011-09-06 DIAGNOSIS — Z79899 Other long term (current) drug therapy: Secondary | ICD-10-CM | POA: Insufficient documentation

## 2011-09-06 DIAGNOSIS — E1169 Type 2 diabetes mellitus with other specified complication: Secondary | ICD-10-CM | POA: Insufficient documentation

## 2011-09-06 DIAGNOSIS — L97509 Non-pressure chronic ulcer of other part of unspecified foot with unspecified severity: Secondary | ICD-10-CM | POA: Insufficient documentation

## 2011-09-14 ENCOUNTER — Telehealth: Payer: Self-pay | Admitting: Family Medicine

## 2011-09-14 MED ORDER — PROCHLORPERAZINE MALEATE 10 MG PO TABS: 10.0000 mg | ORAL_TABLET | Freq: Four times a day (QID) | ORAL | Status: DC | PRN

## 2011-09-14 NOTE — Telephone Encounter (Signed)
Script sent e-scribe 

## 2011-09-16 ENCOUNTER — Other Ambulatory Visit (HOSPITAL_BASED_OUTPATIENT_CLINIC_OR_DEPARTMENT_OTHER): Payer: Self-pay | Admitting: Internal Medicine

## 2011-09-16 ENCOUNTER — Ambulatory Visit (HOSPITAL_COMMUNITY)
Admission: RE | Admit: 2011-09-16 | Discharge: 2011-09-16 | Disposition: A | Discharge status: Home / Self Care | Payer: Medicare Other | Source: Ambulatory Visit | Attending: Internal Medicine | Admitting: Internal Medicine

## 2011-09-16 DIAGNOSIS — M869 Osteomyelitis, unspecified: Secondary | ICD-10-CM

## 2011-09-16 DIAGNOSIS — M7730 Calcaneal spur, unspecified foot: Secondary | ICD-10-CM | POA: Insufficient documentation

## 2011-09-17 ENCOUNTER — Other Ambulatory Visit (HOSPITAL_BASED_OUTPATIENT_CLINIC_OR_DEPARTMENT_OTHER): Payer: Self-pay | Admitting: General Surgery

## 2011-09-17 DIAGNOSIS — R52 Pain, unspecified: Secondary | ICD-10-CM

## 2011-09-17 DIAGNOSIS — R609 Edema, unspecified: Secondary | ICD-10-CM

## 2011-09-20 ENCOUNTER — Other Ambulatory Visit (HOSPITAL_COMMUNITY): Payer: Medicare Other

## 2011-09-21 ENCOUNTER — Other Ambulatory Visit (INDEPENDENT_AMBULATORY_CARE_PROVIDER_SITE_OTHER): Payer: Medicare Other

## 2011-09-21 ENCOUNTER — Other Ambulatory Visit: Payer: Self-pay | Admitting: Family Medicine

## 2011-09-21 DIAGNOSIS — D649 Anemia, unspecified: Secondary | ICD-10-CM

## 2011-09-22 LAB — CBC WITH DIFFERENTIAL/PLATELET
Basophils Absolute: 0 10*3/uL (ref 0.0–0.1)
Eosinophils Absolute: 0.3 10*3/uL (ref 0.0–0.7)
Eosinophils Relative: 3.1 % (ref 0.0–5.0)
HCT: 38.4 % — ABNORMAL LOW (ref 39.0–52.0)
Lymphs Abs: 1.3 10*3/uL (ref 0.7–4.0)
MCHC: 32.9 g/dL (ref 30.0–36.0)
MCV: 94.4 fl (ref 78.0–100.0)
Monocytes Absolute: 1.1 10*3/uL — ABNORMAL HIGH (ref 0.1–1.0)
Neutrophils Relative %: 71.2 % (ref 43.0–77.0)
Platelets: 335 10*3/uL (ref 150.0–400.0)
RDW: 14.8 % — ABNORMAL HIGH (ref 11.5–14.6)
WBC: 9.2 10*3/uL (ref 4.5–10.5)

## 2011-09-23 ENCOUNTER — Ambulatory Visit (HOSPITAL_COMMUNITY)
Admission: RE | Admit: 2011-09-23 | Discharge: 2011-09-23 | Disposition: A | Discharge status: Home / Self Care | Payer: Medicare Other | Source: Ambulatory Visit | Attending: General Surgery | Admitting: General Surgery

## 2011-09-23 DIAGNOSIS — R609 Edema, unspecified: Secondary | ICD-10-CM

## 2011-09-23 DIAGNOSIS — L97509 Non-pressure chronic ulcer of other part of unspecified foot with unspecified severity: Secondary | ICD-10-CM | POA: Insufficient documentation

## 2011-09-23 DIAGNOSIS — M899 Disorder of bone, unspecified: Secondary | ICD-10-CM | POA: Insufficient documentation

## 2011-09-23 DIAGNOSIS — M7730 Calcaneal spur, unspecified foot: Secondary | ICD-10-CM | POA: Insufficient documentation

## 2011-09-23 DIAGNOSIS — R52 Pain, unspecified: Secondary | ICD-10-CM

## 2011-09-23 DIAGNOSIS — E1169 Type 2 diabetes mellitus with other specified complication: Secondary | ICD-10-CM | POA: Insufficient documentation

## 2011-09-24 ENCOUNTER — Encounter (HOSPITAL_BASED_OUTPATIENT_CLINIC_OR_DEPARTMENT_OTHER): Payer: Medicare Other | Attending: Internal Medicine

## 2011-09-29 ENCOUNTER — Telehealth: Payer: Self-pay | Admitting: Family Medicine

## 2011-09-29 NOTE — Telephone Encounter (Signed)
Pt is aware.  

## 2011-09-29 NOTE — Telephone Encounter (Signed)
Message copied by Baldemar Friday on Wed Sep 29, 2011  5:55 PM ------      Message from: Gershon Crane A      Created: Sun Sep 26, 2011  5:32 PM       normal

## 2011-10-06 ENCOUNTER — Other Ambulatory Visit: Payer: Self-pay | Admitting: Family Medicine

## 2011-10-06 MED ORDER — PIOGLITAZONE HCL 30 MG PO TABS: 30.0000 mg | ORAL_TABLET | Freq: Every day | ORAL | Status: DC

## 2011-10-06 NOTE — Telephone Encounter (Signed)
Pt need actos 30 mg call into [facility] [address] [phone]

## 2011-10-06 NOTE — Telephone Encounter (Signed)
Script sent e-scribe 

## 2011-10-09 ENCOUNTER — Inpatient Hospital Stay (HOSPITAL_COMMUNITY)
Admission: AD | Admit: 2011-10-09 | Discharge: 2011-10-11 | DRG: 565 | Disposition: A | Discharge status: Home Health | Payer: Medicare Other | Source: Ambulatory Visit | Attending: Orthopedic Surgery | Admitting: Orthopedic Surgery

## 2011-10-09 DIAGNOSIS — T8740 Infection of amputation stump, unspecified extremity: Principal | ICD-10-CM | POA: Diagnosis present

## 2011-10-09 DIAGNOSIS — E119 Type 2 diabetes mellitus without complications: Secondary | ICD-10-CM | POA: Diagnosis present

## 2011-10-09 DIAGNOSIS — Y849 Medical procedure, unspecified as the cause of abnormal reaction of the patient, or of later complication, without mention of misadventure at the time of the procedure: Secondary | ICD-10-CM | POA: Diagnosis present

## 2011-10-09 DIAGNOSIS — L03119 Cellulitis of unspecified part of limb: Secondary | ICD-10-CM | POA: Diagnosis present

## 2011-10-09 DIAGNOSIS — L02619 Cutaneous abscess of unspecified foot: Secondary | ICD-10-CM | POA: Diagnosis present

## 2011-10-09 LAB — GLUCOSE, CAPILLARY: Glucose-Capillary: 142 mg/dL — ABNORMAL HIGH (ref 70–99)

## 2011-10-09 LAB — C-REACTIVE PROTEIN: CRP: 10.41 mg/dL — ABNORMAL HIGH (ref <0.60)

## 2011-10-09 LAB — CREATININE, SERUM
Creatinine, Ser: 0.64 mg/dL (ref 0.50–1.35)
GFR calc Af Amer: >90 mL/min
GFR calc non Af Amer: 88 mL/min — ABNORMAL LOW

## 2011-10-09 LAB — HEMOGLOBIN A1C: Mean Plasma Glucose: 148 mg/dL — ABNORMAL HIGH (ref <117)

## 2011-10-10 LAB — CBC
MCH: 30.8 pg (ref 26.0–34.0)
MCHC: 34 g/dL (ref 30.0–36.0)
Platelets: 375 10*3/uL (ref 150–400)
RDW: 13.6 % (ref 11.5–15.5)

## 2011-10-10 LAB — GLUCOSE, CAPILLARY
Glucose-Capillary: 137 mg/dL — ABNORMAL HIGH (ref 70–99)
Glucose-Capillary: 103 mg/dL — ABNORMAL HIGH (ref 70–99)

## 2011-10-10 NOTE — H&P (Signed)
  [name], [name] NO.:  [id number]  MEDICAL RECORD NO.:  [id number]  LOCATION:  1530                         FACILITY:  [facility]  PHYSICIAN:  Leonides Grills, M.D.     DATE OF BIRTH:  1925/09/17  DATE OF ADMISSION:  10/09/2011 DATE OF DISCHARGE:                             HISTORY & PHYSICAL   CHIEF COMPLAINT:  Increased redness of right foot.  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  He has a history of diabetes mellitus.  Over the last couple days, he has noticed increased redness to the foot.  He also had an ulcer on the dorsal lateral aspect of his right great toe.  He has been treated by the [facility] for this.  He has had no fevers or chills.  He presents today for further evaluation on this.  PAST MEDICAL HISTORY:  Significant for diabetes mellitus.  PAST SURGERIES:  Bilateral total knee arthroplasty and a right second toe amputation.  ALLERGIES:  He has allergies to MORPHINE.  SOCIAL HISTORY:  He does not smoke.  Does not drink.  REVIEW OF SYSTEMS:  Denies any fever, chills.  No purulence.  PHYSICAL EXAMINATION:  GENERAL:  Well nourished, well developed, in no acute distress.  Very pleasant gentleman. HEENT:  Normocephalic, atraumatic.  Extraocular movements are intact. CHEST:  Equal bilateral expansion and contraction. NEUROLOGIC:  He is alert and oriented x3. EXTREMITIES:  He has erythema about the right foot and on the forefoot region.  There is no palpable fluctuance.  There are actually wrinkles in his foot.  Today, 2 stitches left in foot were removed.  There is no purulence.  The wound was at the area of the second toe amputation. There is a small, approximately 6-mm wide, ulcer on the dorsal lateral aspect of the right great toe; no purulence, and granulation at the base.  IMPRESSION:  Cellulitis, right foot, 2 weeks status post  second toe amputation with great toe dorsal lateral ulcer.  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  We will start him on vancomycin and Zosyn.  We will have a PICC line placed and then he will be discharged with home IV vancomycin and Zosyn.  We will treat him likely for 2 weeks for this.  He is to wash this but without soap at least twice a day, and apply Xeroform dressing to his wounds.  We went over some great detail with the patient.  All questions were encouraged and answered.     Leonides Grills, M.D.     PB/MEDQ  D:  10/09/2011  T:  10/09/2011  Job:  [id number]  Electronically Signed by Leonides Grills M.D. on 10/10/2011 07:30:18 PM

## 2011-10-11 LAB — GLUCOSE, CAPILLARY

## 2011-10-20 ENCOUNTER — Other Ambulatory Visit: Payer: Self-pay | Admitting: *Deleted

## 2011-10-20 MED ORDER — GLUCOSE BLOOD VI STRP: ORAL_STRIP | Status: DC

## 2011-10-22 ENCOUNTER — Encounter (HOSPITAL_BASED_OUTPATIENT_CLINIC_OR_DEPARTMENT_OTHER): Payer: Medicare Other | Attending: General Surgery

## 2011-10-22 DIAGNOSIS — M869 Osteomyelitis, unspecified: Secondary | ICD-10-CM | POA: Insufficient documentation

## 2011-10-22 DIAGNOSIS — Y836 Removal of other organ (partial) (total) as the cause of abnormal reaction of the patient, or of later complication, without mention of misadventure at the time of the procedure: Secondary | ICD-10-CM | POA: Insufficient documentation

## 2011-10-22 DIAGNOSIS — N400 Enlarged prostate without lower urinary tract symptoms: Secondary | ICD-10-CM | POA: Insufficient documentation

## 2011-10-22 DIAGNOSIS — M9080 Osteopathy in diseases classified elsewhere, unspecified site: Secondary | ICD-10-CM | POA: Insufficient documentation

## 2011-10-22 DIAGNOSIS — L97509 Non-pressure chronic ulcer of other part of unspecified foot with unspecified severity: Secondary | ICD-10-CM | POA: Insufficient documentation

## 2011-10-22 DIAGNOSIS — Z8614 Personal history of Methicillin resistant Staphylococcus aureus infection: Secondary | ICD-10-CM | POA: Insufficient documentation

## 2011-10-22 DIAGNOSIS — Z79899 Other long term (current) drug therapy: Secondary | ICD-10-CM | POA: Insufficient documentation

## 2011-10-22 DIAGNOSIS — Z954 Presence of other heart-valve replacement: Secondary | ICD-10-CM | POA: Insufficient documentation

## 2011-10-22 DIAGNOSIS — E1169 Type 2 diabetes mellitus with other specified complication: Secondary | ICD-10-CM | POA: Insufficient documentation

## 2011-10-22 DIAGNOSIS — T8740 Infection of amputation stump, unspecified extremity: Secondary | ICD-10-CM | POA: Insufficient documentation

## 2011-10-26 ENCOUNTER — Other Ambulatory Visit: Payer: Self-pay | Admitting: Family Medicine

## 2011-10-27 ENCOUNTER — Other Ambulatory Visit (HOSPITAL_BASED_OUTPATIENT_CLINIC_OR_DEPARTMENT_OTHER): Payer: Self-pay | Admitting: General Surgery

## 2011-10-27 ENCOUNTER — Emergency Department (HOSPITAL_COMMUNITY)
Admission: EM | Admit: 2011-10-27 | Discharge: 2011-10-27 | Disposition: A | Discharge status: Home / Self Care | Payer: Medicare Other | Attending: Emergency Medicine | Admitting: Emergency Medicine

## 2011-10-27 ENCOUNTER — Encounter (HOSPITAL_COMMUNITY): Payer: Self-pay | Admitting: Adult Health

## 2011-10-27 ENCOUNTER — Emergency Department (HOSPITAL_COMMUNITY): Payer: Medicare Other

## 2011-10-27 DIAGNOSIS — Z452 Encounter for adjustment and management of vascular access device: Secondary | ICD-10-CM

## 2011-10-27 DIAGNOSIS — Z79899 Other long term (current) drug therapy: Secondary | ICD-10-CM | POA: Insufficient documentation

## 2011-10-27 DIAGNOSIS — E119 Type 2 diabetes mellitus without complications: Secondary | ICD-10-CM | POA: Insufficient documentation

## 2011-10-27 HISTORY — DX: Benign prostatic hyperplasia without lower urinary tract symptoms: N40.0

## 2011-10-27 NOTE — ED Notes (Signed)
IV RN Thompson Caul is here at b/s to remove PICC.

## 2011-10-27 NOTE — ED Notes (Signed)
MD at bedside. 

## 2011-10-27 NOTE — ED Notes (Signed)
IV team called.  Sterile dressing intact over picc site.  Pt taken to x-ray to check on picc placement.  All vitals stable.  NAD.

## 2011-10-27 NOTE — ED Notes (Signed)
IV team paged to remove picc line and pt will be d/c and instructed to return for another picc line placement.  Pt made aware and is ok with this.

## 2011-10-27 NOTE — ED Notes (Signed)
IV team has been paged to assess and treat the PICC that needs advancing.

## 2011-10-27 NOTE — ED Notes (Signed)
Awaiting arrival of IV team to remove PICC line.   Otherwise, pt resting w/no complaints.

## 2011-10-27 NOTE — ED Provider Notes (Signed)
History     CSN: [id number] Arrival date & time: 10/27/2011  5:50 PM   First MD Initiated Contact with Patient 10/27/11 1806      Chief Complaint  Patient presents with  . Vascular Access Problem    The history is provided by the patient.   Patient presents emergency department with malfunction of PICC line.  Patient has indwelling PICC line for IV antibiotics because of a diabetic ulcer. Began coming out yesterday he is not put any medication through the line over the last 24 hours.  Otherwise he has no complaints.  Patient has had periods of up to 4 days without getting antibiotics.  He prefers to come back tomorrow unless were able to correct the problem.  IV team was notified and requested a chest x-ray which was ordered. Past Medical History  Diagnosis Date  . Diabetes mellitus   . BPH (benign prostatic hyperplasia)     Past Surgical History  Procedure Date  . Aortic valve replacement     Family History  Problem Relation Age of Onset  . Diabetes Mother   . Diabetes Father     History  Substance Use Topics  . Smoking status: Never Smoker   . Smokeless tobacco: Not on file  . Alcohol Use: 4.2 oz/week    7 Glasses of wine per week      Review of Systems  All other systems reviewed and are negative.    Allergies  Ace inhibitors and Morphine and related  Home Medications   Current Outpatient Rx  Name Route Sig Dispense Refill  . ASPIRIN 81 MG PO TABS Oral Take 81 mg by mouth daily.      Marland Kitchen CALCIUM-VITAMIN D 500-200 MG-UNIT PO TABS Oral Take 1 tablet by mouth daily.      Marland Kitchen DICLOFENAC SODIUM 75 MG PO TBEC Oral Take 75 mg by mouth 2 (two) times daily.      . DUTASTERIDE 0.5 MG PO CAPS Oral Take 0.5 mg by mouth daily.      Marland Kitchen ESOMEPRAZOLE MAGNESIUM 40 MG PO CPDR Oral Take 40 mg by mouth daily before breakfast.      . EZETIMIBE 10 MG PO TABS Oral Take 10 mg by mouth daily.      Marland Kitchen GLIPIZIDE ER 10 MG PO TB24 Oral Take 20 mg by mouth daily.      Marland Kitchen GLUCOSE BLOOD VI  STRP  Use as instructed 100 each 12  . ACCU-CHEK SOFT TOUCH LANCETS MISC Other 1 each by Other route as needed. Use as instructed     . NON FORMULARY  CPAP     . PIOGLITAZONE HCL 30 MG PO TABS Oral Take 1 tablet (30 mg total) by mouth daily. 30 tablet 6  . ROSUVASTATIN CALCIUM 10 MG PO TABS Oral Take 10 mg by mouth daily.      Marland Kitchen SITAGLIPTIN-METFORMIN HCL 50-1000 MG PO TABS Oral Take 1 tablet by mouth 2 (two) times daily with a meal.      . SOLIFENACIN SUCCINATE 10 MG PO TABS Oral Take 1 tablet (10 mg total) by mouth daily. 30 tablet 2  . TAMSULOSIN HCL 0.4 MG PO CAPS Oral Take 0.4 mg by mouth daily.        BP 143/73  Pulse 72  Temp(Src) 97.6 F (36.4 C) (Oral)  SpO2 97%  Physical Exam  Nursing note and vitals reviewed. Constitutional: He is oriented to person, place, and time. He appears well-developed and well-nourished.  Eyes: Conjunctivae  are normal.  Neck: Normal range of motion.  Cardiovascular: Normal rate.   Pulmonary/Chest: Effort normal.       PICC line and right arm appears to be partially displaced.  Musculoskeletal: Normal range of motion.  Neurological: He is alert and oriented to person, place, and time.    ED Course  Procedures (including critical care time)  IV team contacted and evaluated line.  Line removed and patient st up to return in AM for line placement.   MDM          Nelia Shi, MD 10/28/11 [phone]

## 2011-10-27 NOTE — ED Notes (Signed)
Reports PICC line is out further than normal. Receiving antibiotics for wound infection on right foot. VANC and Zosyn.

## 2011-10-28 ENCOUNTER — Ambulatory Visit (HOSPITAL_COMMUNITY)
Admission: RE | Admit: 2011-10-28 | Discharge: 2011-10-28 | Disposition: A | Discharge status: Home / Self Care | Payer: Medicare Other | Source: Ambulatory Visit | Attending: Orthopedic Surgery | Admitting: Orthopedic Surgery

## 2011-10-28 ENCOUNTER — Other Ambulatory Visit (HOSPITAL_COMMUNITY): Payer: Self-pay | Admitting: Orthopedic Surgery

## 2011-10-28 DIAGNOSIS — I878 Other specified disorders of veins: Secondary | ICD-10-CM

## 2011-10-28 DIAGNOSIS — L089 Local infection of the skin and subcutaneous tissue, unspecified: Secondary | ICD-10-CM | POA: Insufficient documentation

## 2011-10-28 LAB — GLUCOSE, CAPILLARY: Glucose-Capillary: 229 mg/dL — ABNORMAL HIGH (ref 70–99)

## 2011-10-28 NOTE — Procedures (Signed)
Right upper extremity single lumen PICC placed under US/fluoro guidance. Length 40 cm. Tip in SVC/RA junction. No immediate complications. Medications utilized 1% lidocaine.

## 2011-10-29 ENCOUNTER — Other Ambulatory Visit (HOSPITAL_BASED_OUTPATIENT_CLINIC_OR_DEPARTMENT_OTHER): Payer: Self-pay | Admitting: General Surgery

## 2011-10-30 LAB — GLUCOSE, CAPILLARY: Glucose-Capillary: 201 mg/dL — ABNORMAL HIGH (ref 70–99)

## 2011-11-01 ENCOUNTER — Other Ambulatory Visit (HOSPITAL_BASED_OUTPATIENT_CLINIC_OR_DEPARTMENT_OTHER): Payer: Self-pay | Admitting: General Surgery

## 2011-11-01 LAB — GLUCOSE, CAPILLARY: Glucose-Capillary: 160 mg/dL — ABNORMAL HIGH (ref 70–99)

## 2011-11-02 ENCOUNTER — Other Ambulatory Visit (HOSPITAL_BASED_OUTPATIENT_CLINIC_OR_DEPARTMENT_OTHER): Payer: Self-pay | Admitting: General Surgery

## 2011-11-02 LAB — GLUCOSE, CAPILLARY
Glucose-Capillary: 133 mg/dL — ABNORMAL HIGH (ref 70–99)
Glucose-Capillary: 101 mg/dL — ABNORMAL HIGH (ref 70–99)

## 2011-11-03 ENCOUNTER — Other Ambulatory Visit (HOSPITAL_BASED_OUTPATIENT_CLINIC_OR_DEPARTMENT_OTHER): Payer: Self-pay | Admitting: General Surgery

## 2011-11-03 LAB — GLUCOSE, CAPILLARY: Glucose-Capillary: 128 mg/dL — ABNORMAL HIGH (ref 70–99)

## 2011-11-04 ENCOUNTER — Other Ambulatory Visit (HOSPITAL_BASED_OUTPATIENT_CLINIC_OR_DEPARTMENT_OTHER): Payer: Self-pay | Admitting: General Surgery

## 2011-11-04 LAB — GLUCOSE, CAPILLARY: Glucose-Capillary: 162 mg/dL — ABNORMAL HIGH (ref 70–99)

## 2011-11-05 ENCOUNTER — Other Ambulatory Visit (HOSPITAL_BASED_OUTPATIENT_CLINIC_OR_DEPARTMENT_OTHER): Payer: Self-pay | Admitting: General Surgery

## 2011-11-05 NOTE — Discharge Summary (Signed)
NAME:  Stanley [name], Stanley [name] NO.:  [id number]  MEDICAL RECORD NO.:  [id number]  LOCATION:  XRAY                         FACILITY:  [facility]  PHYSICIAN:  Richardean Canal, P.A.    DATE OF BIRTH:  Jan 25, 1925  DATE OF ADMISSION:  10/09/2011 DATE OF DISCHARGE:  10/11/2011                        DISCHARGE SUMMARY - REFERRING   ADMITTING DIAGNOSIS:  Right foot erythema.  DISCHARGE DIAGNOSIS:  Cellulitis, right foot, two weeks status post second toe amputation with great toe dorsolateral ulcer.  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  The patient has history of diabetes mellitus.  Over the last few days, he noticed increased erythema about the foot.  He has an ulcer on the dorsolateral aspect of his right great toe.  He is admitted to have a PICC line placed and received IV antibiotics.  HOSPITAL COURSE:  The patient was admitted for IV antibiotics and PICC line placement.  The patient remained afebrile throughout hospital stay. Vital signs remained stable.  The patient was discharged on hospital day #2 once PICC line was placed, then home health and IV antibiotics were set up.  LABORATORY DATA:  Routine labs on admission dated October 10, 2011; white count was 9100, hemoglobin 11.0, hematocrit 32.4, platelets were 375,000.  C-reactive protein was high at 10.41 mg/dL.  Glucose ranged from 83-206, while hospitalized 90 mg/dL.  PICC line was placed under sterile procedure and was confirmed using ultrasound.  DISCHARGE MEDICATIONS: 1. Oxycodone 5/325 one to two p.o. q.4-6 hours p.r.n. pain. 2. Zosyn 50 mL, 3.375 g IV q.8 hours for 2 weeks. 3. Vancomycin 1 g IV q.12 hours for 2 weeks. 4. Actos 1 tablet q.a.m. 5. Aspirin 81 mg 1 q.a.m. 6. Crestor 1 p.o. q.a.m. 7. Diclofenac 75 mg 1 p.o. b.i.d. 8. Glipizide 10 mg 2 tablets 1 q.a.m. 9. Dutasteride 1 capsule q.a.m. 10.Janumet 1 tab  b.i.d. 11.Multivitamin 1 tab daily. 12.Nexium 40 mg 1 p.o. q.a.m. 13.Vesicare 10 mg 1 q.a.m. 14.Zetia 10 mg 1 daily.  WOUND CARE:  Wash foot with Dove soap twice daily.  Apply clean bandage.  DIET:  Diabetic.  WEIGHTBEARING ACTIVITIES:  Heel weightbearing in Darco shoe.  FOLLOWUP:  The patient is to follow up with Dr. Lestine Box on October 12, 2011 at 1400 hours.  Office number is [phone].  CONDITION:  Patient is discharged to home in good stable condition.     Richardean Canal, P.A.     GC/MEDQ  D:  11/05/2011  T:  11/05/2011  Job:  [id number]  cc:   Leonides Grills, M.D. Fax: [phone]

## 2011-11-08 ENCOUNTER — Other Ambulatory Visit (HOSPITAL_BASED_OUTPATIENT_CLINIC_OR_DEPARTMENT_OTHER): Payer: Self-pay | Admitting: General Surgery

## 2011-11-08 NOTE — H&P (Signed)
  NAME:  FRIEDRICH, HARRIOTT NO.:  [id number]  MEDICAL RECORD NO.:  [id number]  LOCATION:  FOOT                         FACILITY:  [facility]  PHYSICIAN:  Ardath Sax, M.D.     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  He has had diabetes for 10 years, and he takes really just a few medicines.  He is on Flomax, Actos, Crestor, Zetia, aspirin, and Janumet.  He had a normal blood pressure, temperature 98.6.  He has a history of bilateral knee replacements, one of which was treated here at the wound clinic with Dermagraft for an open wound which eventually healed.  He presently has 2 diabetic ulcers on his right foot, the first and second toes, and I really think that one on the big toe is because of the way of the second toe rubs against it, so we debrided them and we are going to treat him with collagen and Santyl, and put lamb's wool in between the toes and we will see him once a week and in the meantime, we ordered an x-ray to make sure there is no osteo.  It seem inconceivable that we may think about him doing hyperbaric oxygen but for the time being we will treat him with Santyl and collagen and we will go from there.     Ardath Sax, M.D.     PP/MEDQ  D:  09/06/2011  T:  09/06/2011  Job:  [id number]  Electronically Signed by Ardath Sax  on 11/08/2011 03:03:20 PM

## 2011-11-09 ENCOUNTER — Other Ambulatory Visit (HOSPITAL_BASED_OUTPATIENT_CLINIC_OR_DEPARTMENT_OTHER): Payer: Self-pay | Admitting: General Surgery

## 2011-11-09 LAB — GLUCOSE, CAPILLARY: Glucose-Capillary: 133 mg/dL — ABNORMAL HIGH (ref 70–99)

## 2011-11-10 ENCOUNTER — Other Ambulatory Visit (HOSPITAL_BASED_OUTPATIENT_CLINIC_OR_DEPARTMENT_OTHER): Payer: Self-pay | Admitting: General Surgery

## 2011-11-10 LAB — GLUCOSE, CAPILLARY: Glucose-Capillary: 147 mg/dL — ABNORMAL HIGH (ref 70–99)

## 2011-11-12 ENCOUNTER — Other Ambulatory Visit: Payer: Self-pay | Admitting: Family Medicine

## 2011-11-15 ENCOUNTER — Other Ambulatory Visit (HOSPITAL_BASED_OUTPATIENT_CLINIC_OR_DEPARTMENT_OTHER): Payer: Self-pay | Admitting: General Surgery

## 2011-11-16 ENCOUNTER — Other Ambulatory Visit (HOSPITAL_BASED_OUTPATIENT_CLINIC_OR_DEPARTMENT_OTHER): Payer: Self-pay | Admitting: General Surgery

## 2011-11-17 ENCOUNTER — Other Ambulatory Visit (HOSPITAL_BASED_OUTPATIENT_CLINIC_OR_DEPARTMENT_OTHER): Payer: Self-pay | Admitting: General Surgery

## 2011-11-17 LAB — GLUCOSE, CAPILLARY
Glucose-Capillary: 144 mg/dL — ABNORMAL HIGH (ref 70–99)
Glucose-Capillary: 90 mg/dL (ref 70–99)

## 2011-11-19 ENCOUNTER — Other Ambulatory Visit (HOSPITAL_BASED_OUTPATIENT_CLINIC_OR_DEPARTMENT_OTHER): Payer: Self-pay | Admitting: General Surgery

## 2011-11-19 LAB — GLUCOSE, CAPILLARY
Glucose-Capillary: 179 mg/dL — ABNORMAL HIGH (ref 70–99)
Glucose-Capillary: 129 mg/dL — ABNORMAL HIGH (ref 70–99)

## 2011-11-22 ENCOUNTER — Encounter (HOSPITAL_BASED_OUTPATIENT_CLINIC_OR_DEPARTMENT_OTHER): Payer: Medicare Other | Attending: General Surgery

## 2011-11-22 ENCOUNTER — Other Ambulatory Visit (HOSPITAL_BASED_OUTPATIENT_CLINIC_OR_DEPARTMENT_OTHER): Payer: Self-pay | Admitting: General Surgery

## 2011-11-22 DIAGNOSIS — L97509 Non-pressure chronic ulcer of other part of unspecified foot with unspecified severity: Secondary | ICD-10-CM | POA: Insufficient documentation

## 2011-11-22 DIAGNOSIS — E1169 Type 2 diabetes mellitus with other specified complication: Secondary | ICD-10-CM | POA: Insufficient documentation

## 2011-11-23 ENCOUNTER — Other Ambulatory Visit (HOSPITAL_BASED_OUTPATIENT_CLINIC_OR_DEPARTMENT_OTHER): Payer: Self-pay | Admitting: General Surgery

## 2011-11-23 LAB — GLUCOSE, CAPILLARY
Glucose-Capillary: 162 mg/dL — ABNORMAL HIGH (ref 70–99)
Glucose-Capillary: 154 mg/dL — ABNORMAL HIGH (ref 70–99)

## 2011-11-24 ENCOUNTER — Other Ambulatory Visit (HOSPITAL_BASED_OUTPATIENT_CLINIC_OR_DEPARTMENT_OTHER): Payer: Self-pay | Admitting: General Surgery

## 2011-11-24 LAB — GLUCOSE, CAPILLARY: Glucose-Capillary: 198 mg/dL — ABNORMAL HIGH (ref 70–99)

## 2011-11-25 ENCOUNTER — Other Ambulatory Visit (HOSPITAL_BASED_OUTPATIENT_CLINIC_OR_DEPARTMENT_OTHER): Payer: Self-pay | Admitting: General Surgery

## 2011-11-25 LAB — GLUCOSE, CAPILLARY: Glucose-Capillary: 154 mg/dL — ABNORMAL HIGH (ref 70–99)

## 2011-11-26 ENCOUNTER — Encounter (HOSPITAL_BASED_OUTPATIENT_CLINIC_OR_DEPARTMENT_OTHER): Payer: Medicare Other

## 2011-11-29 ENCOUNTER — Other Ambulatory Visit (HOSPITAL_BASED_OUTPATIENT_CLINIC_OR_DEPARTMENT_OTHER): Payer: Self-pay | Admitting: General Surgery

## 2011-11-29 LAB — GLUCOSE, CAPILLARY: Glucose-Capillary: 105 mg/dL — ABNORMAL HIGH (ref 70–99)

## 2011-11-30 ENCOUNTER — Other Ambulatory Visit (HOSPITAL_BASED_OUTPATIENT_CLINIC_OR_DEPARTMENT_OTHER): Payer: Self-pay | Admitting: General Surgery

## 2011-11-30 LAB — GLUCOSE, CAPILLARY: Glucose-Capillary: 140 mg/dL — ABNORMAL HIGH (ref 70–99)

## 2011-12-01 ENCOUNTER — Other Ambulatory Visit (HOSPITAL_BASED_OUTPATIENT_CLINIC_OR_DEPARTMENT_OTHER): Payer: Self-pay | Admitting: General Surgery

## 2011-12-02 ENCOUNTER — Other Ambulatory Visit (HOSPITAL_BASED_OUTPATIENT_CLINIC_OR_DEPARTMENT_OTHER): Payer: Self-pay | Admitting: General Surgery

## 2011-12-02 LAB — GLUCOSE, CAPILLARY: Glucose-Capillary: 183 mg/dL — ABNORMAL HIGH (ref 70–99)

## 2011-12-06 ENCOUNTER — Other Ambulatory Visit (HOSPITAL_BASED_OUTPATIENT_CLINIC_OR_DEPARTMENT_OTHER): Payer: Self-pay | Admitting: General Surgery

## 2011-12-06 LAB — GLUCOSE, CAPILLARY: Glucose-Capillary: 130 mg/dL — ABNORMAL HIGH (ref 70–99)

## 2011-12-07 ENCOUNTER — Other Ambulatory Visit (HOSPITAL_BASED_OUTPATIENT_CLINIC_OR_DEPARTMENT_OTHER): Payer: Self-pay | Admitting: General Surgery

## 2011-12-07 LAB — GLUCOSE, CAPILLARY
Glucose-Capillary: 170 mg/dL — ABNORMAL HIGH (ref 70–99)
Glucose-Capillary: 242 mg/dL — ABNORMAL HIGH (ref 70–99)

## 2011-12-08 ENCOUNTER — Other Ambulatory Visit (HOSPITAL_BASED_OUTPATIENT_CLINIC_OR_DEPARTMENT_OTHER): Payer: Self-pay | Admitting: General Surgery

## 2011-12-08 LAB — GLUCOSE, CAPILLARY: Glucose-Capillary: 122 mg/dL — ABNORMAL HIGH (ref 70–99)

## 2011-12-10 ENCOUNTER — Other Ambulatory Visit (HOSPITAL_BASED_OUTPATIENT_CLINIC_OR_DEPARTMENT_OTHER): Payer: Self-pay | Admitting: General Surgery

## 2011-12-10 LAB — GLUCOSE, CAPILLARY: Glucose-Capillary: 148 mg/dL — ABNORMAL HIGH (ref 70–99)

## 2011-12-15 ENCOUNTER — Telehealth: Payer: Self-pay

## 2011-12-15 ENCOUNTER — Other Ambulatory Visit (HOSPITAL_BASED_OUTPATIENT_CLINIC_OR_DEPARTMENT_OTHER): Payer: Self-pay | Admitting: General Surgery

## 2011-12-15 MED ORDER — FLUTICASONE PROPIONATE 50 MCG/ACT NA SUSP: 2.0000 | Freq: Every day | NASAL | Status: DC

## 2011-12-15 NOTE — Telephone Encounter (Signed)
Pt request Flonase for nasal congestion.  Ok per Dr. Fabian Sharp to send electronically.  Pt aware.

## 2011-12-16 ENCOUNTER — Other Ambulatory Visit (HOSPITAL_BASED_OUTPATIENT_CLINIC_OR_DEPARTMENT_OTHER): Payer: Self-pay | Admitting: General Surgery

## 2011-12-17 ENCOUNTER — Other Ambulatory Visit (HOSPITAL_BASED_OUTPATIENT_CLINIC_OR_DEPARTMENT_OTHER): Payer: Self-pay | Admitting: General Surgery

## 2011-12-17 LAB — GLUCOSE, CAPILLARY: Glucose-Capillary: 281 mg/dL — ABNORMAL HIGH (ref 70–99)

## 2011-12-20 ENCOUNTER — Other Ambulatory Visit (HOSPITAL_BASED_OUTPATIENT_CLINIC_OR_DEPARTMENT_OTHER): Payer: Self-pay | Admitting: General Surgery

## 2011-12-20 LAB — GLUCOSE, CAPILLARY: Glucose-Capillary: 100 mg/dL — ABNORMAL HIGH (ref 70–99)

## 2011-12-22 ENCOUNTER — Encounter (HOSPITAL_BASED_OUTPATIENT_CLINIC_OR_DEPARTMENT_OTHER): Payer: Medicare Other | Attending: General Surgery

## 2011-12-22 ENCOUNTER — Other Ambulatory Visit (INDEPENDENT_AMBULATORY_CARE_PROVIDER_SITE_OTHER): Payer: Medicare Other

## 2011-12-22 DIAGNOSIS — E1159 Type 2 diabetes mellitus with other circulatory complications: Secondary | ICD-10-CM | POA: Insufficient documentation

## 2011-12-22 DIAGNOSIS — D649 Anemia, unspecified: Secondary | ICD-10-CM

## 2011-12-22 DIAGNOSIS — E538 Deficiency of other specified B group vitamins: Secondary | ICD-10-CM

## 2011-12-22 DIAGNOSIS — L97509 Non-pressure chronic ulcer of other part of unspecified foot with unspecified severity: Secondary | ICD-10-CM | POA: Insufficient documentation

## 2011-12-22 DIAGNOSIS — M869 Osteomyelitis, unspecified: Secondary | ICD-10-CM | POA: Insufficient documentation

## 2011-12-22 DIAGNOSIS — E119 Type 2 diabetes mellitus without complications: Secondary | ICD-10-CM

## 2011-12-22 DIAGNOSIS — N390 Urinary tract infection, site not specified: Secondary | ICD-10-CM

## 2011-12-22 LAB — CBC WITH DIFFERENTIAL/PLATELET
Basophils Relative: 0.4 % (ref 0.0–3.0)
Eosinophils Absolute: 0.1 10*3/uL (ref 0.0–0.7)
Hemoglobin: 13.3 g/dL (ref 13.0–17.0)
MCHC: 33.1 g/dL (ref 30.0–36.0)
MCV: 90.7 fl (ref 78.0–100.0)
Monocytes Absolute: 1.4 10*3/uL — ABNORMAL HIGH (ref 0.1–1.0)
Neutro Abs: 7.4 10*3/uL (ref 1.4–7.7)
RBC: 4.44 Mil/uL (ref 4.22–5.81)

## 2011-12-22 LAB — POCT URINALYSIS DIPSTICK
Blood, UA: NEGATIVE
Spec Grav, UA: 1.025
Urobilinogen, UA: 0.2

## 2011-12-23 ENCOUNTER — Other Ambulatory Visit (HOSPITAL_BASED_OUTPATIENT_CLINIC_OR_DEPARTMENT_OTHER): Payer: Self-pay | Admitting: General Surgery

## 2011-12-23 NOTE — Progress Notes (Signed)
Quick Note:  Pt aware, per Alisha. ______

## 2011-12-24 ENCOUNTER — Other Ambulatory Visit (HOSPITAL_BASED_OUTPATIENT_CLINIC_OR_DEPARTMENT_OTHER): Payer: Self-pay | Admitting: General Surgery

## 2011-12-27 ENCOUNTER — Other Ambulatory Visit (HOSPITAL_BASED_OUTPATIENT_CLINIC_OR_DEPARTMENT_OTHER): Payer: Self-pay | Admitting: General Surgery

## 2011-12-27 LAB — GLUCOSE, CAPILLARY
Glucose-Capillary: 212 mg/dL — ABNORMAL HIGH (ref 70–99)
Glucose-Capillary: 198 mg/dL — ABNORMAL HIGH (ref 70–99)
Glucose-Capillary: 165 mg/dL — ABNORMAL HIGH (ref 70–99)

## 2011-12-28 ENCOUNTER — Other Ambulatory Visit (HOSPITAL_BASED_OUTPATIENT_CLINIC_OR_DEPARTMENT_OTHER): Payer: Self-pay | Admitting: General Surgery

## 2011-12-28 LAB — GLUCOSE, CAPILLARY: Glucose-Capillary: 145 mg/dL — ABNORMAL HIGH (ref 70–99)

## 2011-12-29 ENCOUNTER — Other Ambulatory Visit (HOSPITAL_BASED_OUTPATIENT_CLINIC_OR_DEPARTMENT_OTHER): Payer: Self-pay | Admitting: General Surgery

## 2011-12-29 LAB — GLUCOSE, CAPILLARY: Glucose-Capillary: 118 mg/dL — ABNORMAL HIGH (ref 70–99)

## 2011-12-30 ENCOUNTER — Other Ambulatory Visit (HOSPITAL_BASED_OUTPATIENT_CLINIC_OR_DEPARTMENT_OTHER): Payer: Self-pay | Admitting: General Surgery

## 2011-12-31 ENCOUNTER — Other Ambulatory Visit (HOSPITAL_BASED_OUTPATIENT_CLINIC_OR_DEPARTMENT_OTHER): Payer: Self-pay | Admitting: General Surgery

## 2012-01-04 ENCOUNTER — Other Ambulatory Visit (HOSPITAL_BASED_OUTPATIENT_CLINIC_OR_DEPARTMENT_OTHER): Payer: Self-pay | Admitting: General Surgery

## 2012-01-04 LAB — GLUCOSE, CAPILLARY
Glucose-Capillary: 117 mg/dL — ABNORMAL HIGH (ref 70–99)
Glucose-Capillary: 117 mg/dL — ABNORMAL HIGH (ref 70–99)
Glucose-Capillary: 141 mg/dL — ABNORMAL HIGH (ref 70–99)
Glucose-Capillary: 189 mg/dL — ABNORMAL HIGH (ref 70–99)

## 2012-01-06 LAB — GLUCOSE, CAPILLARY: Glucose-Capillary: 129 mg/dL — ABNORMAL HIGH (ref 70–99)

## 2012-01-11 LAB — GLUCOSE, CAPILLARY: Glucose-Capillary: 133 mg/dL — ABNORMAL HIGH (ref 70–99)

## 2012-01-13 LAB — GLUCOSE, CAPILLARY
Glucose-Capillary: 143 mg/dL — ABNORMAL HIGH (ref 70–99)
Glucose-Capillary: 151 mg/dL — ABNORMAL HIGH (ref 70–99)

## 2012-01-14 LAB — GLUCOSE, CAPILLARY
Glucose-Capillary: 226 mg/dL — ABNORMAL HIGH (ref 70–99)
Glucose-Capillary: 190 mg/dL — ABNORMAL HIGH (ref 70–99)

## 2012-01-18 ENCOUNTER — Encounter (HOSPITAL_BASED_OUTPATIENT_CLINIC_OR_DEPARTMENT_OTHER): Payer: Self-pay | Admitting: *Deleted

## 2012-01-19 ENCOUNTER — Encounter (HOSPITAL_BASED_OUTPATIENT_CLINIC_OR_DEPARTMENT_OTHER)
Admission: RE | Disposition: A | Discharge status: Home / Self Care | Payer: Self-pay | Source: Ambulatory Visit | Attending: Orthopedic Surgery

## 2012-01-19 ENCOUNTER — Ambulatory Visit (HOSPITAL_BASED_OUTPATIENT_CLINIC_OR_DEPARTMENT_OTHER)
Admission: RE | Admit: 2012-01-19 | Discharge: 2012-01-19 | Disposition: A | Discharge status: Home / Self Care | Payer: Medicare Other | Source: Ambulatory Visit | Attending: Orthopedic Surgery | Admitting: Orthopedic Surgery

## 2012-01-19 ENCOUNTER — Encounter (HOSPITAL_BASED_OUTPATIENT_CLINIC_OR_DEPARTMENT_OTHER): Payer: Self-pay | Admitting: Certified Registered Nurse Anesthetist

## 2012-01-19 ENCOUNTER — Encounter (HOSPITAL_BASED_OUTPATIENT_CLINIC_OR_DEPARTMENT_OTHER): Payer: Self-pay | Admitting: *Deleted

## 2012-01-19 ENCOUNTER — Ambulatory Visit (HOSPITAL_BASED_OUTPATIENT_CLINIC_OR_DEPARTMENT_OTHER): Payer: Medicare Other | Admitting: Certified Registered Nurse Anesthetist

## 2012-01-19 ENCOUNTER — Other Ambulatory Visit: Payer: Self-pay | Admitting: Physician Assistant

## 2012-01-19 ENCOUNTER — Other Ambulatory Visit: Payer: Self-pay | Admitting: Orthopedic Surgery

## 2012-01-19 DIAGNOSIS — K219 Gastro-esophageal reflux disease without esophagitis: Secondary | ICD-10-CM | POA: Insufficient documentation

## 2012-01-19 DIAGNOSIS — I10 Essential (primary) hypertension: Secondary | ICD-10-CM | POA: Insufficient documentation

## 2012-01-19 DIAGNOSIS — L089 Local infection of the skin and subcutaneous tissue, unspecified: Secondary | ICD-10-CM

## 2012-01-19 DIAGNOSIS — G4733 Obstructive sleep apnea (adult) (pediatric): Secondary | ICD-10-CM | POA: Insufficient documentation

## 2012-01-19 DIAGNOSIS — E669 Obesity, unspecified: Secondary | ICD-10-CM | POA: Insufficient documentation

## 2012-01-19 DIAGNOSIS — I2510 Atherosclerotic heart disease of native coronary artery without angina pectoris: Secondary | ICD-10-CM | POA: Insufficient documentation

## 2012-01-19 DIAGNOSIS — M86179 Other acute osteomyelitis, unspecified ankle and foot: Secondary | ICD-10-CM | POA: Insufficient documentation

## 2012-01-19 DIAGNOSIS — M9080 Osteopathy in diseases classified elsewhere, unspecified site: Secondary | ICD-10-CM | POA: Insufficient documentation

## 2012-01-19 DIAGNOSIS — E1169 Type 2 diabetes mellitus with other specified complication: Secondary | ICD-10-CM | POA: Insufficient documentation

## 2012-01-19 HISTORY — PX: AMPUTATION: SHX166

## 2012-01-19 HISTORY — DX: Peripheral vascular disease, unspecified: I73.9

## 2012-01-19 HISTORY — DX: Essential (primary) hypertension: I10

## 2012-01-19 HISTORY — DX: Gastro-esophageal reflux disease without esophagitis: K21.9

## 2012-01-19 HISTORY — DX: Shortness of breath: R06.02

## 2012-01-19 HISTORY — DX: Broken internal right knee prosthesis, initial encounter: T84.012A

## 2012-01-19 HISTORY — DX: Unspecified osteoarthritis, unspecified site: M19.90

## 2012-01-19 LAB — POCT I-STAT, CHEM 8
Creatinine, Ser: 0.7 mg/dL (ref 0.50–1.35)
Hemoglobin: 13.6 g/dL (ref 13.0–17.0)
Sodium: 134 mEq/L — ABNORMAL LOW (ref 135–145)
TCO2: 25 mmol/L (ref 0–100)

## 2012-01-19 LAB — GLUCOSE, CAPILLARY: Glucose-Capillary: 167 mg/dL — ABNORMAL HIGH (ref 70–99)

## 2012-01-19 SURGERY — AMPUTATION DIGIT
Anesthesia: General | Site: Toe | Laterality: Right | Wound class: Dirty or Infected

## 2012-01-19 MED ORDER — MIDAZOLAM HCL 2 MG/2ML IJ SOLN
1.0000 mg | INTRAMUSCULAR | Status: DC | PRN
  Administered 2012-01-19: 1 mg via INTRAVENOUS

## 2012-01-19 MED ORDER — PROPOFOL 10 MG/ML IV EMUL
INTRAVENOUS | Status: DC | PRN
  Administered 2012-01-19: 100 mg via INTRAVENOUS

## 2012-01-19 MED ORDER — HYDROMORPHONE HCL PF 1 MG/ML IJ SOLN: 0.2500 mg | INTRAMUSCULAR | Status: DC | PRN

## 2012-01-19 MED ORDER — SODIUM CHLORIDE 0.9 % IV SOLN: INTRAVENOUS | Status: DC

## 2012-01-19 MED ORDER — LACTATED RINGERS IV SOLN
INTRAVENOUS | Status: DC
  Administered 2012-01-19 (×2): via INTRAVENOUS

## 2012-01-19 MED ORDER — CHLORHEXIDINE GLUCONATE 4 % EX LIQD: 60.0000 mL | Freq: Once | CUTANEOUS | Status: DC

## 2012-01-19 MED ORDER — BUPIVACAINE HCL (PF) 0.5 % IJ SOLN
INTRAMUSCULAR | Status: DC | PRN
  Administered 2012-01-19: 40 mL

## 2012-01-19 MED ORDER — CEFAZOLIN SODIUM 1-5 GM-% IV SOLN
1.0000 g | INTRAVENOUS | Status: AC
  Administered 2012-01-19: 1 g via INTRAVENOUS

## 2012-01-19 MED ORDER — METOCLOPRAMIDE HCL 5 MG PO TABS: 5.0000 mg | ORAL_TABLET | Freq: Three times a day (TID) | ORAL | Status: DC | PRN

## 2012-01-19 MED ORDER — ONDANSETRON HCL 4 MG/2ML IJ SOLN: 4.0000 mg | Freq: Four times a day (QID) | INTRAMUSCULAR | Status: DC | PRN

## 2012-01-19 MED ORDER — FENTANYL CITRATE 0.05 MG/ML IJ SOLN
50.0000 ug | INTRAMUSCULAR | Status: DC | PRN
  Administered 2012-01-19: 100 ug via INTRAVENOUS

## 2012-01-19 MED ORDER — ONDANSETRON HCL 4 MG/2ML IJ SOLN
INTRAMUSCULAR | Status: DC | PRN
  Administered 2012-01-19: 4 mg via INTRAVENOUS

## 2012-01-19 MED ORDER — ONDANSETRON HCL 4 MG PO TABS: 4.0000 mg | ORAL_TABLET | Freq: Four times a day (QID) | ORAL | Status: DC | PRN

## 2012-01-19 MED ORDER — METOCLOPRAMIDE HCL 5 MG/ML IJ SOLN: 5.0000 mg | Freq: Three times a day (TID) | INTRAMUSCULAR | Status: DC | PRN

## 2012-01-19 MED ORDER — PROMETHAZINE HCL 25 MG/ML IJ SOLN: 6.2500 mg | INTRAMUSCULAR | Status: DC | PRN

## 2012-01-19 SURGICAL SUPPLY — 49 items
BANDAGE ELASTIC 4 VELCRO ST LF (GAUZE/BANDAGES/DRESSINGS) ×2 IMPLANT
BLADE SURG 15 STRL LF DISP TIS (BLADE) ×1 IMPLANT
BLADE SURG 15 STRL SS (BLADE) ×2
BRUSH SCRUB EZ PLAIN DRY (MISCELLANEOUS) ×2 IMPLANT
CLOTH BEACON ORANGE TIMEOUT ST (SAFETY) ×2 IMPLANT
COVER TABLE BACK 60X90 (DRAPES) ×2 IMPLANT
DRAPE EXTREMITY T 121X128X90 (DRAPE) ×2 IMPLANT
DRAPE SURG 17X23 STRL (DRAPES) ×2 IMPLANT
DRSG PAD ABDOMINAL 8X10 ST (GAUZE/BANDAGES/DRESSINGS) ×2 IMPLANT
DURA STEPPER LG (CAST SUPPLIES) ×1 IMPLANT
DURA STEPPER MED (CAST SUPPLIES) ×1 IMPLANT
DURA STEPPER SML (CAST SUPPLIES) ×1 IMPLANT
ELECT REM PT RETURN 9FT ADLT (ELECTROSURGICAL) ×2
ELECTRODE REM PT RTRN 9FT ADLT (ELECTROSURGICAL) ×1 IMPLANT
GAUZE XEROFORM 1X8 LF (GAUZE/BANDAGES/DRESSINGS) ×2 IMPLANT
GLOVE BIO SURGEON STRL SZ 6.5 (GLOVE) ×2 IMPLANT
GLOVE BIO SURGEON STRL SZ8 (GLOVE) ×2 IMPLANT
GLOVE BIOGEL PI IND STRL 7.0 (GLOVE) IMPLANT
GLOVE BIOGEL PI IND STRL 8 (GLOVE) ×2 IMPLANT
GLOVE BIOGEL PI INDICATOR 7.0 (GLOVE) ×1
GLOVE BIOGEL PI INDICATOR 8 (GLOVE) ×2
GLOVE SURG SS PI 8.0 STRL IVOR (GLOVE) ×2 IMPLANT
GOWN BRE IMP PREV XXLGXLNG (GOWN DISPOSABLE) ×3 IMPLANT
GOWN PREVENTION PLUS XLARGE (GOWN DISPOSABLE) ×2 IMPLANT
NEEDLE HYPO 22GX1.5 SAFETY (NEEDLE) ×1 IMPLANT
NS IRRIG 1000ML POUR BTL (IV SOLUTION) ×2 IMPLANT
PACK BASIN DAY SURGERY FS (CUSTOM PROCEDURE TRAY) ×2 IMPLANT
PAD CAST 4YDX4 CTTN HI CHSV (CAST SUPPLIES) ×1 IMPLANT
PADDING CAST ABS 4INX4YD NS (CAST SUPPLIES) ×1
PADDING CAST ABS COTTON 4X4 ST (CAST SUPPLIES) ×1 IMPLANT
PADDING CAST COTTON 4X4 STRL (CAST SUPPLIES) ×2
PENCIL BUTTON HOLSTER BLD 10FT (ELECTRODE) ×2 IMPLANT
SHEET MEDIUM DRAPE 40X70 STRL (DRAPES) ×3 IMPLANT
SPONGE GAUZE 4X4 12PLY (GAUZE/BANDAGES/DRESSINGS) ×2 IMPLANT
STOCKINETTE 6  STRL (DRAPES) ×1
STOCKINETTE 6 STRL (DRAPES) ×1 IMPLANT
SUT BONE WAX W31G (SUTURE) ×1 IMPLANT
SUT ETHILON 4 0 PS 2 18 (SUTURE) ×2 IMPLANT
SUT MON AB 4-0 PC3 18 (SUTURE) ×1 IMPLANT
SUT VIC AB 2-0 PS2 27 (SUTURE) ×2 IMPLANT
SUT VIC AB 3-0 PS1 18 (SUTURE) ×2
SUT VIC AB 3-0 PS1 18XBRD (SUTURE) ×1 IMPLANT
SYR 20CC LL (SYRINGE) ×1 IMPLANT
SYR BULB 3OZ (MISCELLANEOUS) ×2 IMPLANT
SYR CONTROL 10ML LL (SYRINGE) ×1 IMPLANT
TOWEL OR 17X24 6PK STRL BLUE (TOWEL DISPOSABLE) ×2 IMPLANT
TOWEL OR NON WOVEN STRL DISP B (DISPOSABLE) ×2 IMPLANT
UNDERPAD 30X30 INCONTINENT (UNDERPADS AND DIAPERS) ×2 IMPLANT
WATER STERILE IRR 1000ML POUR (IV SOLUTION) ×2 IMPLANT

## 2012-01-19 NOTE — Transfer of Care (Signed)
Immediate Anesthesia Transfer of Care Note  Patient: Stanley [name]  Procedure(s) Performed:  AMPUTATION DIGIT - right great toe amputation through MTP joint  Patient Location: PACU  Anesthesia Type: GA combined with regional for post-op pain  Level of Consciousness: sedated  Airway & Oxygen Therapy: Patient Spontanous Breathing and Patient connected to face mask oxygen  Post-op Assessment: Report given to PACU RN and Post -op Vital signs reviewed and stable  Post vital signs: Reviewed and stable  Complications: No apparent anesthesia complications

## 2012-01-19 NOTE — Anesthesia Postprocedure Evaluation (Signed)
  Anesthesia Post-op Note  Patient: Stanley [name]  Procedure(s) Performed:  AMPUTATION DIGIT - right great toe amputation through MTP joint  Patient Location: PACU  Anesthesia Type: GA combined with regional for post-op pain  Level of Consciousness: awake, alert  and oriented  Airway and Oxygen Therapy: Patient Spontanous Breathing  Post-op Pain: none  Post-op Assessment: Post-op Vital signs reviewed, Patient's Cardiovascular Status Stable, Respiratory Function Stable, Patent Airway, No signs of Nausea or vomiting and Pain level controlled  Post-op Vital Signs: Reviewed and stable  Complications: No apparent anesthesia complications

## 2012-01-19 NOTE — H&P (Signed)
  H&P documentation: Placed to be scanned history and physical exam in chart.  -History and Physical Reviewed  -Patient has been re-examined  -No change in the plan of care  Stanley [name] A  

## 2012-01-19 NOTE — Brief Op Note (Signed)
01/19/2012  2:12 PM  PATIENT:  Stanley [name]  78 y.o. male  PRE-OPERATIVE DIAGNOSIS:  right great toe osteomylitis  POST-OPERATIVE DIAGNOSIS:  same as preop  PROCEDURE:  Procedure(s): AMPUTATION Right Great Toe thru MTP joint  SURGEON:  Surgeon(s): Sherri Rad, MD  PHYSICIAN ASSISTANT: Rexene Edison, PAC  ASSISTANTS: Above   ANESTHESIA:   general  EBL:  Total I/O In: 300 [I.V.:300] Out: -   BLOOD ADMINISTERED:none  DRAINS: none   LOCAL MEDICATIONS USED:  NONE  SPECIMEN:  Source of Specimen:  Amputated great toe  DISPOSITION OF SPECIMEN:  PATHOLOGY  COUNTS:  YES  TOURNIQUET:   Total Tourniquet Time Documented: Thigh (Right) - 6 minutes  DICTATION: .Other Dictation: Dictation Number [id number]  PLAN OF CARE: Discharge to home after PACU  PATIENT DISPOSITION:  PACU - hemodynamically stable.   Delay start of Pharmacological VTE agent (>24hrs) due to surgical blood loss or risk of bleeding:  {YES/NO/NOT APPLICABLE:20182

## 2012-01-19 NOTE — Anesthesia Procedure Notes (Addendum)
Anesthesia Regional Block:  Ankle block  Pre-Anesthetic Checklist: ,, timeout performed, Correct Patient, Correct Site, Correct Laterality, Correct Procedure, Correct Position, site marked, Risks and benefits discussed,  Surgical consent,  Pre-op evaluation,  At surgeon's request and post-op pain management  Laterality: Right  Prep: chloraprep       Needles:  Injection technique: Single-shot  Needle Type: Quincke      Needle Gauge: 25 and 25 G    Additional Needles: Ankle block Narrative:  Start time: 01/19/2012 1:15 PM End time: 01/19/2012 1:21 PM Injection made incrementally with aspirations every 5 mL.  Performed by: Personally  Anesthesiologist: Sandford Craze, MD  Additional Notes: Pt identified in Holding room.  Monitors applied. Working IV access confirmed. Sterile prep.  #22ga and total 40cc 0.5% Bupivacaine injected incrementally around deep peroneal, sup peroneal, saph, sural, and post tib nerves.  Patient asymptomatic, VSS, no heme aspirated, tolerated well.   Sandford Craze, MD   Procedure Name: LMA Insertion Date/Time: 01/19/2012 1:33 PM Performed by: Jearld Shines Pre-anesthesia Checklist: Patient identified, Emergency Drugs available, Suction available and Patient being monitored Patient Re-evaluated:Patient Re-evaluated prior to inductionOxygen Delivery Method: Circle System Utilized Preoxygenation: Pre-oxygenation with 100% oxygen Intubation Type: IV induction Ventilation: Mask ventilation without difficulty LMA: LMA with gastric port inserted LMA Size: 4.0 Number of attempts: 1 Airway Equipment and Method: bite block Placement Confirmation: positive ETCO2 Tube secured with: Tape Dental Injury: Teeth and Oropharynx as per pre-operative assessment

## 2012-01-19 NOTE — Anesthesia Preprocedure Evaluation (Signed)
Anesthesia Evaluation  Patient identified by MRN, date of birth, ID band Patient awake    Reviewed: Allergy & Precautions, H&P , NPO status , Patient's Chart, lab work & pertinent test results  History of Anesthesia Complications (+) PSEUDOCHOLINESTERASE DEFICIENCY  Airway Mallampati: I TM Distance: >3 FB Neck ROM: Full    Dental No notable dental hx. (+) Teeth Intact, Caps and Dental Advisory Given   Pulmonary sleep apnea and Continuous Positive Airway Pressure Ventilation ,  clear to auscultation  Pulmonary exam normal       Cardiovascular hypertension, + CAD (stable s/p CABG x 2) and + CABG + Valvular Problems/Murmurs (s/p AVR- LVF normal, EF 60-65%) AS Regular Normal    Neuro/Psych    GI/Hepatic Neg liver ROS, GERD-  Medicated and Controlled,  Endo/Other  Diabetes mellitus- (glu 162), Well Controlled, Type 2, Oral Hypoglycemic AgentsHypothyroidism (treated)   Renal/GU negative Renal ROS     Musculoskeletal   Abdominal (+) obese,   Peds  Hematology   Anesthesia Other Findings   Reproductive/Obstetrics                           Anesthesia Physical Anesthesia Plan  ASA: III  Anesthesia Plan: General   Post-op Pain Management:    Induction: Intravenous  Airway Management Planned: LMA  Additional Equipment:   Intra-op Plan:   Post-operative Plan:   Informed Consent: I have reviewed the patients History and Physical, chart, labs and discussed the procedure including the risks, benefits and alternatives for the proposed anesthesia with the patient or authorized representative who has indicated his/her understanding and acceptance.   Dental advisory given  Plan Discussed with: CRNA and Surgeon  Anesthesia Plan Comments: (Plan routine monitors, GA- LMA OK, ankle block for post op analgesia)        Anesthesia Quick Evaluation

## 2012-01-19 NOTE — Progress Notes (Signed)
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 Side rails up, monitors on throughout procedure. See vital signs in flow sheet. Tolerated Procedure well. 

## 2012-01-20 ENCOUNTER — Encounter (HOSPITAL_BASED_OUTPATIENT_CLINIC_OR_DEPARTMENT_OTHER): Payer: Self-pay | Admitting: Orthopedic Surgery

## 2012-01-20 NOTE — Op Note (Signed)
NAME:  Stanley [name], Stanley [name] NO.:  MEDICAL RECORD NO.:  [id number]  LOCATION:                                 FACILITY:  PHYSICIAN:  Leonides Grills, M.D.     DATE OF BIRTH:  24-Apr-1925  DATE OF PROCEDURE:  01/19/2012 DATE OF DISCHARGE:                              OPERATIVE REPORT   PREOPERATIVE DIAGNOSIS:  Osteomyelitis, right great toe.  POSTOPERATIVE DIAGNOSIS:  Osteomyelitis, right great toe.  OPERATION:  Right great toe amputation through MTP joint.  ANESTHESIA:  General.  SURGEON:  Leonides Grills, MD  ASSISTANT:  Richardean Canal, PA-C  ESTIMATED BLOOD LOSS:  Minimal.  COMPLICATIONS:  None.  DISPOSITION:  Stable to PR.  First assistant was used throughout the case for retraction, aid in visualization, suction, closure, and dressing application.  INDICATION:  This is an 78 year old gentleman who has diabetes mellitus with peripheral neuropathy.  He had a slow healing ulcer on the dorsal aspect of the left great toe.  He was being followed by [facility], and he was even undergoing hyperbaric treatment for this. Despite these efforts, it continually progressed down to bone and actually entered the IP joint of the right great to with bone exposed. He was consented for the above procedure.  All risks, infection, nerve or vessel injury, wound healing problems, more proximal amputation, pain, DVT, PE were all explained.  Questions were encouraged and answered.  OPERATION:  The patient was brought to the operating room and placed in supine position.  After adequate general endotracheal anesthesia was administered as well as Ancef 1 g IV piggyback, right lower extremity was then prepped and draped in sterile manner.  A full-thickness down to bone racquet-shaped incision was then made around the base of the right great toe.  Dissection was carried down directly to bone as a full- thickness flap.  Soft tissue was elevated on bone down to the MTP  joint, and the toe was then disarticulated.  This was nice and clean.  No evidence of infection at this zone.  There was excellent bleeding tissue, and hemostasis was obtained.  The area was copiously irrigated with normal saline.  This deep subcu was closed with 3-0 Vicryl.  Skin was closed with 4-0 nylon.  Sterile dressing was applied.  Hard sole shoe was applied.  The patient was stable to the PR.     Leonides Grills, M.D.     PB/MEDQ  D:  01/19/2012  T:  01/20/2012  Job:  [id number]

## 2012-01-24 ENCOUNTER — Encounter (HOSPITAL_BASED_OUTPATIENT_CLINIC_OR_DEPARTMENT_OTHER): Payer: Medicare Other | Attending: General Surgery

## 2012-01-24 DIAGNOSIS — M869 Osteomyelitis, unspecified: Secondary | ICD-10-CM | POA: Insufficient documentation

## 2012-01-24 DIAGNOSIS — L97509 Non-pressure chronic ulcer of other part of unspecified foot with unspecified severity: Secondary | ICD-10-CM | POA: Insufficient documentation

## 2012-01-24 DIAGNOSIS — E1169 Type 2 diabetes mellitus with other specified complication: Secondary | ICD-10-CM | POA: Insufficient documentation

## 2012-01-24 DIAGNOSIS — S98139A Complete traumatic amputation of one unspecified lesser toe, initial encounter: Secondary | ICD-10-CM | POA: Insufficient documentation

## 2012-01-25 LAB — GLUCOSE, CAPILLARY: Glucose-Capillary: 216 mg/dL — ABNORMAL HIGH (ref 70–99)

## 2012-01-26 ENCOUNTER — Encounter (HOSPITAL_BASED_OUTPATIENT_CLINIC_OR_DEPARTMENT_OTHER): Payer: Medicare Other

## 2012-01-26 LAB — GLUCOSE, CAPILLARY: Glucose-Capillary: 203 mg/dL — ABNORMAL HIGH (ref 70–99)

## 2012-01-27 LAB — GLUCOSE, CAPILLARY
Glucose-Capillary: 191 mg/dL — ABNORMAL HIGH (ref 70–99)
Glucose-Capillary: 106 mg/dL — ABNORMAL HIGH (ref 70–99)

## 2012-01-31 LAB — GLUCOSE, CAPILLARY: Glucose-Capillary: 140 mg/dL — ABNORMAL HIGH (ref 70–99)

## 2012-02-01 LAB — GLUCOSE, CAPILLARY
Glucose-Capillary: 129 mg/dL — ABNORMAL HIGH (ref 70–99)
Glucose-Capillary: 125 mg/dL — ABNORMAL HIGH (ref 70–99)

## 2012-02-02 LAB — GLUCOSE, CAPILLARY
Glucose-Capillary: 149 mg/dL — ABNORMAL HIGH (ref 70–99)
Glucose-Capillary: 145 mg/dL — ABNORMAL HIGH (ref 70–99)

## 2012-02-03 LAB — GLUCOSE, CAPILLARY: Glucose-Capillary: 179 mg/dL — ABNORMAL HIGH (ref 70–99)

## 2012-02-08 LAB — GLUCOSE, CAPILLARY: Glucose-Capillary: 136 mg/dL — ABNORMAL HIGH (ref 70–99)

## 2012-02-10 LAB — GLUCOSE, CAPILLARY: Glucose-Capillary: 133 mg/dL — ABNORMAL HIGH (ref 70–99)

## 2012-02-11 LAB — GLUCOSE, CAPILLARY: Glucose-Capillary: 161 mg/dL — ABNORMAL HIGH (ref 70–99)

## 2012-02-14 LAB — GLUCOSE, CAPILLARY: Glucose-Capillary: 154 mg/dL — ABNORMAL HIGH (ref 70–99)

## 2012-02-15 LAB — GLUCOSE, CAPILLARY: Glucose-Capillary: 156 mg/dL — ABNORMAL HIGH (ref 70–99)

## 2012-02-16 ENCOUNTER — Other Ambulatory Visit: Payer: Self-pay

## 2012-02-16 DIAGNOSIS — L97509 Non-pressure chronic ulcer of other part of unspecified foot with unspecified severity: Secondary | ICD-10-CM

## 2012-02-17 ENCOUNTER — Encounter (INDEPENDENT_AMBULATORY_CARE_PROVIDER_SITE_OTHER): Payer: Medicare Other | Admitting: *Deleted

## 2012-02-17 ENCOUNTER — Ambulatory Visit (INDEPENDENT_AMBULATORY_CARE_PROVIDER_SITE_OTHER): Payer: Medicare Other | Admitting: Vascular Surgery

## 2012-02-17 ENCOUNTER — Encounter: Payer: Self-pay | Admitting: Vascular Surgery

## 2012-02-17 VITALS — BP 135/58 | HR 84 | Resp 16 | Ht 70.0 in | Wt 172.0 lb

## 2012-02-17 DIAGNOSIS — L98499 Non-pressure chronic ulcer of skin of other sites with unspecified severity: Secondary | ICD-10-CM

## 2012-02-17 DIAGNOSIS — I7025 Atherosclerosis of native arteries of other extremities with ulceration: Secondary | ICD-10-CM | POA: Insufficient documentation

## 2012-02-17 DIAGNOSIS — L97509 Non-pressure chronic ulcer of other part of unspecified foot with unspecified severity: Secondary | ICD-10-CM

## 2012-02-17 DIAGNOSIS — I739 Peripheral vascular disease, unspecified: Secondary | ICD-10-CM

## 2012-02-17 NOTE — Progress Notes (Signed)
[facility] [place] HISTORY AND PHYSICAL   History of Present Illness:  Stanley [name] is a 78 y.o. year old male who presents for evaluation of a nonhealing wound of the right foot. The Stanley [name] previously underwent a second toe amputation in November of 2012. This had some difficulty healing and had infection in it but did eventually heal spontaneously. He then had amputation of his right first toe approximately 3 weeks ago. This has failed to heal. He is currently being followed at the wound center by Dr. Jimmey Ralph and undergoing hyperbaric therapy.  His toe amputations were done by Dr. Lestine Box.  He denies prior history of claudication or rest pain. He is a retired Stage manager. Other medical problems include diabetes, hypertension, degenerative arthritis, coronary artery disease. His cardiologist is Dr. Juanito Doom.  He is currently on doxycycline and clindamycin.  Past Medical History  Diagnosis Date  . Diabetes mellitus   . BPH (benign prostatic hyperplasia)   . Arthritis   . GERD (gastroesophageal reflux disease)   . Neuromuscular disorder   . Hypertension     no meds  . Shortness of breath     uses cpap  . Peripheral vascular disease   . Failed total knee, right     Past Surgical History  Procedure Date  . Aortic valve replacement   . Foot arthrotomy   . Cardiac valve replacement   . Tonsillectomy   . Eye surgery     both cataracts  . Fracture surgery     lt huneruous  . Joint replacement     rt total knee  . Joint replacement     lt tiotal knee  . Toe amputation     rt 2nd  . Cardiac catheterization   . Amputation 01/19/2012    Procedure: AMPUTATION DIGIT;  Surgeon: Sherri Rad, MD;  Location: [facility];  Service: Orthopedics;  Laterality: Right;  right great toe amputation through MTP joint     Social History History  Substance Use Topics  . Smoking status: Never Smoker   . Smokeless tobacco: Not on file  . Alcohol Use: 4.2  oz/week    7 Glasses of wine per week    Family History Family History  Problem Relation Age of Onset  . Diabetes Mother   . Diabetes Father     Allergies  Allergies  Allergen Reactions  . Morphine And Related Nausea And Vomiting  . Succinylcholine   . Ace Inhibitors Anxiety     Current Outpatient Prescriptions  Medication Sig Dispense Refill  . aspirin 81 MG tablet Take 81 mg by mouth daily.        . Calcium Carbonate-Vitamin D (CALCIUM-VITAMIN D) 500-200 MG-UNIT per tablet Take 1 tablet by mouth daily.        . clindamycin (CLEOCIN) 300 MG capsule Take 300 mg by mouth QID.      Marland Kitchen diclofenac (VOLTAREN) 75 MG EC tablet Take 75 mg by mouth 2 (two) times daily.        Marland Kitchen doxycycline (VIBRAMYCIN) 100 MG capsule Take 100 mg by mouth 2 (two) times daily.      Marland Kitchen dutasteride (AVODART) 0.5 MG capsule Take 0.5 mg by mouth daily.        Marland Kitchen esomeprazole (NEXIUM) 40 MG capsule Take 40 mg by mouth daily before breakfast.        . ezetimibe (ZETIA) 10 MG tablet Take 10 mg by mouth daily.        Marland Kitchen  FERREX 150 150 MG capsule take 1 capsule by mouth once daily  90 capsule  3  . fluticasone (FLONASE) 50 MCG/ACT nasal spray Place 2 sprays into the nose daily.  16 g  2  . GLIPIZIDE XL 10 MG 24 hr tablet take 2 tablets by mouth once daily  180 tablet  0  . HYDROcodone-acetaminophen (LORTAB) 10-500 MG per tablet Take 1 tablet by mouth every 6 (six) hours as needed.      . Multiple Vitamins-Minerals (CENTRUM SILVER ULTRA MENS) TABS Take 1 tablet by mouth daily.        . pioglitazone (ACTOS) 30 MG tablet Take 1 tablet (30 mg total) by mouth daily.  30 tablet  6  . rosuvastatin (CRESTOR) 10 MG tablet Take 10 mg by mouth daily.        . sitaGLIPtan-metformin (JANUMET) 50-1000 MG per tablet Take 1 tablet by mouth 2 (two) times daily with a meal.        . solifenacin (VESICARE) 10 MG tablet Take 1 tablet (10 mg total) by mouth daily.  30 tablet  2  . Tamsulosin HCl (FLOMAX) 0.4 MG CAPS Take 0.4 mg by mouth  daily.        . vitamin B-12 (CYANOCOBALAMIN) 1000 MCG tablet Take 1,000 mcg by mouth daily. Takes 1 tablet monthly         ROS:   General:  No weight loss, Fever, chills  HEENT: No recent headaches, no nasal bleeding, no visual changes, no sore throat  Neurologic: No dizziness, blackouts, seizures. No recent symptoms of stroke or mini- stroke. No recent episodes of slurred speech, or temporary blindness.  Cardiac: No recent episodes of chest pain/pressure, no shortness of breath at rest.  No shortness of breath with exertion.  Denies history of atrial fibrillation or irregular heartbeat  Vascular: No history of rest pain in feet.  No history of claudication.  No history of non-healing ulcer, No history of DVT   Pulmonary: No home oxygen, no productive cough, no hemoptysis,  No asthma or wheezing  Musculoskeletal:  [x ] Arthritis, [ ]  Low back pain,  [x ] Joint pain  Hematologic:No history of hypercoagulable state.  No history of easy bleeding.  No history of anemia  Gastrointestinal: No hematochezia or melena,  No gastroesophageal reflux, no trouble swallowing  Urinary: [ ]  chronic Kidney disease, [ ]  on HD - [ ]  MWF or [ ]  TTHS, [ ]  Burning with urination, [ ]  Frequent urination, [ ]  Difficulty urinating;   Skin: No rashes  Psychological: No history of anxiety,  No history of depression   Physical Examination  Filed Vitals:   02/17/12 1432  BP: 135/58  Pulse: 84  Resp: 16  Height: 5\' 10"  (1.778 m)  Weight: 172 lb (78.019 kg)  SpO2: 98%    Body mass index is 24.68 kg/(m^2).  General:  Alert and oriented, no acute distress HEENT: Normal Neck: No bruit or JVD Pulmonary: Clear to auscultation bilaterally Cardiac: Regular Rate and Rhythm without murmur Abdomen: Soft, non-tender, non-distended, no mass Skin: No rash, 3 cm ulcerated area at the distal metatarsal of the first toe right foot, 3 cm ulceration over the lateral fifth metatarsal, well-healed right second toe  amputation Extremity Pulses:  2+ radial, brachial, femoral, popliteal, absent dorsalis pedis, posterior tibial pulses bilaterally Musculoskeletal: No deformity or edema  Neurologic: Upper and lower extremity motor 5/5 and symmetric  DATA: Stanley [name] had bilateral ABIs performed today. His vessels were calcified to ABIs  cannot be determined. His waveforms were monophasic bilaterally. I reviewed and interpreted this study.   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 Risks benefits possible complications and procedure details including but not limited to bleeding infection contrast nephropathy were explained the Stanley [name] today. Risk of vessel injury was also explained. He agrees to proceed.  Fabienne Bruns, MD [facility] [place] Office: [phone] Pager: [phone]

## 2012-02-18 ENCOUNTER — Other Ambulatory Visit: Payer: Self-pay

## 2012-02-18 ENCOUNTER — Encounter (HOSPITAL_BASED_OUTPATIENT_CLINIC_OR_DEPARTMENT_OTHER): Payer: Medicare Other | Attending: General Surgery

## 2012-02-18 DIAGNOSIS — E1169 Type 2 diabetes mellitus with other specified complication: Secondary | ICD-10-CM | POA: Insufficient documentation

## 2012-02-18 DIAGNOSIS — L97509 Non-pressure chronic ulcer of other part of unspecified foot with unspecified severity: Secondary | ICD-10-CM | POA: Insufficient documentation

## 2012-02-18 DIAGNOSIS — Y835 Amputation of limb(s) as the cause of abnormal reaction of the patient, or of later complication, without mention of misadventure at the time of the procedure: Secondary | ICD-10-CM | POA: Insufficient documentation

## 2012-02-18 DIAGNOSIS — Z79899 Other long term (current) drug therapy: Secondary | ICD-10-CM | POA: Insufficient documentation

## 2012-02-18 DIAGNOSIS — T8789 Other complications of amputation stump: Secondary | ICD-10-CM | POA: Insufficient documentation

## 2012-02-18 LAB — GLUCOSE, CAPILLARY: Glucose-Capillary: 195 mg/dL — ABNORMAL HIGH (ref 70–99)

## 2012-02-23 ENCOUNTER — Encounter (HOSPITAL_COMMUNITY): Payer: Self-pay | Admitting: Pharmacy Technician

## 2012-02-23 LAB — GLUCOSE, CAPILLARY: Glucose-Capillary: 196 mg/dL — ABNORMAL HIGH (ref 70–99)

## 2012-02-24 LAB — GLUCOSE, CAPILLARY
Glucose-Capillary: 221 mg/dL — ABNORMAL HIGH (ref 70–99)
Glucose-Capillary: 187 mg/dL — ABNORMAL HIGH (ref 70–99)

## 2012-02-25 ENCOUNTER — Encounter (HOSPITAL_COMMUNITY)
Admission: RE | Disposition: A | Discharge status: Home / Self Care | Payer: Self-pay | Source: Ambulatory Visit | Attending: Vascular Surgery

## 2012-02-25 ENCOUNTER — Ambulatory Visit (HOSPITAL_COMMUNITY)
Admission: RE | Admit: 2012-02-25 | Discharge: 2012-02-25 | Disposition: A | Discharge status: Home / Self Care | Payer: Medicare Other | Source: Ambulatory Visit | Attending: Vascular Surgery | Admitting: Vascular Surgery

## 2012-02-25 DIAGNOSIS — E1159 Type 2 diabetes mellitus with other circulatory complications: Secondary | ICD-10-CM | POA: Insufficient documentation

## 2012-02-25 DIAGNOSIS — S98139A Complete traumatic amputation of one unspecified lesser toe, initial encounter: Secondary | ICD-10-CM | POA: Insufficient documentation

## 2012-02-25 DIAGNOSIS — I10 Essential (primary) hypertension: Secondary | ICD-10-CM | POA: Insufficient documentation

## 2012-02-25 DIAGNOSIS — L97509 Non-pressure chronic ulcer of other part of unspecified foot with unspecified severity: Secondary | ICD-10-CM | POA: Insufficient documentation

## 2012-02-25 DIAGNOSIS — I739 Peripheral vascular disease, unspecified: Secondary | ICD-10-CM | POA: Insufficient documentation

## 2012-02-25 DIAGNOSIS — N400 Enlarged prostate without lower urinary tract symptoms: Secondary | ICD-10-CM | POA: Insufficient documentation

## 2012-02-25 DIAGNOSIS — S98119A Complete traumatic amputation of unspecified great toe, initial encounter: Secondary | ICD-10-CM | POA: Insufficient documentation

## 2012-02-25 DIAGNOSIS — Z96659 Presence of unspecified artificial knee joint: Secondary | ICD-10-CM | POA: Insufficient documentation

## 2012-02-25 DIAGNOSIS — R0602 Shortness of breath: Secondary | ICD-10-CM | POA: Insufficient documentation

## 2012-02-25 DIAGNOSIS — L98499 Non-pressure chronic ulcer of skin of other sites with unspecified severity: Secondary | ICD-10-CM | POA: Insufficient documentation

## 2012-02-25 DIAGNOSIS — I70269 Atherosclerosis of native arteries of extremities with gangrene, unspecified extremity: Secondary | ICD-10-CM

## 2012-02-25 HISTORY — PX: ABDOMINAL AORTAGRAM: SHX5454

## 2012-02-25 LAB — GLUCOSE, CAPILLARY: Glucose-Capillary: 97 mg/dL (ref 70–99)

## 2012-02-25 SURGERY — ABDOMINAL AORTAGRAM
Anesthesia: LOCAL

## 2012-02-25 MED ORDER — HYDRALAZINE HCL 20 MG/ML IJ SOLN: 10.0000 mg | INTRAMUSCULAR | Status: DC | PRN

## 2012-02-25 MED ORDER — MIDAZOLAM HCL 2 MG/2ML IJ SOLN
INTRAMUSCULAR | Status: AC
  Filled 2012-02-25: qty 2

## 2012-02-25 MED ORDER — GUAIFENESIN-DM 100-10 MG/5ML PO SYRP: 15.0000 mL | ORAL_SOLUTION | ORAL | Status: DC | PRN

## 2012-02-25 MED ORDER — ACETAMINOPHEN 325 MG RE SUPP: 325.0000 mg | RECTAL | Status: DC | PRN

## 2012-02-25 MED ORDER — LIDOCAINE HCL (PF) 1 % IJ SOLN
INTRAMUSCULAR | Status: AC
  Filled 2012-02-25: qty 30

## 2012-02-25 MED ORDER — OXYCODONE-ACETAMINOPHEN 5-325 MG PO TABS
1.0000 | ORAL_TABLET | ORAL | Status: DC | PRN
  Administered 2012-02-25: 2 via ORAL

## 2012-02-25 MED ORDER — SODIUM CHLORIDE 0.9 % IV SOLN
INTRAVENOUS | Status: DC
  Administered 2012-02-25: 1000 mL via INTRAVENOUS

## 2012-02-25 MED ORDER — HEPARIN (PORCINE) IN NACL 2-0.9 UNIT/ML-% IJ SOLN
INTRAMUSCULAR | Status: AC
  Filled 2012-02-25: qty 1000

## 2012-02-25 MED ORDER — PHENOL 1.4 % MT LIQD: 1.0000 | OROMUCOSAL | Status: DC | PRN

## 2012-02-25 MED ORDER — ONDANSETRON HCL 4 MG/2ML IJ SOLN: 4.0000 mg | Freq: Four times a day (QID) | INTRAMUSCULAR | Status: DC | PRN

## 2012-02-25 MED ORDER — SODIUM CHLORIDE 0.45 % IV SOLN
INTRAVENOUS | Status: DC
  Administered 2012-02-25: 100 mL/h via INTRAVENOUS

## 2012-02-25 MED ORDER — ACETAMINOPHEN 325 MG PO TABS: 325.0000 mg | ORAL_TABLET | ORAL | Status: DC | PRN

## 2012-02-25 MED ORDER — LABETALOL HCL 5 MG/ML IV SOLN: 10.0000 mg | INTRAVENOUS | Status: DC | PRN

## 2012-02-25 MED ORDER — OXYCODONE-ACETAMINOPHEN 5-325 MG PO TABS
ORAL_TABLET | ORAL | Status: AC
  Administered 2012-02-25: 2 via ORAL
  Filled 2012-02-25: qty 2

## 2012-02-25 MED ORDER — METOPROLOL TARTRATE 1 MG/ML IV SOLN
2.0000 mg | INTRAVENOUS | Status: DC | PRN
Start: 2012-02-25 — End: 2012-02-25

## 2012-02-25 MED ORDER — FENTANYL CITRATE 0.05 MG/ML IJ SOLN
INTRAMUSCULAR | Status: AC
  Filled 2012-02-25: qty 2

## 2012-02-25 MED ORDER — SODIUM CHLORIDE 0.9 % IV SOLN: 500.0000 mL | Freq: Once | INTRAVENOUS | Status: DC | PRN

## 2012-02-25 NOTE — Interval H&P Note (Signed)
History and Physical Interval Note:  02/25/2012 7:17 AM  Stanley [name]  has presented today for surgery, with the diagnosis of PVD  The various methods of treatment have been discussed with the patient and family. After consideration of risks, benefits and other options for treatment, the patient has consented to  Procedure(s) (LRB): ABDOMINAL AORTAGRAM (N/A) as a surgical intervention .  The patients' history has been reviewed, patient examined, no change in status, stable for surgery.  I have reviewed the patients' chart and labs.  Questions were answered to the patient's satisfaction.     [name]

## 2012-02-25 NOTE — Discharge Instructions (Signed)

## 2012-02-25 NOTE — Progress Notes (Signed)
D/C instructions given, pt verbalized understanding. Pt walked in hallway without difficulty. Left femoral site unremarkable.

## 2012-02-25 NOTE — Op Note (Signed)
Procedure: Aortogram with bilateral lower extremity runoff  Preoperative diagnosis: Nonhealing wound right foot  Postoperative diagnosis: Same  Anesthesia local with sedation  Operative findings  : Severe unreconstructable tibial disease bilaterally  Operative details: After obtaining informed consent, the patient was taken to the [facility] LAB. The patient was placed in supine position on the Angio table. Both groins were prepped and draped in usual sterile fashion. Local anesthesia was infiltrated over the left common femoral artery. An introducer needle was used to cannulate the left common femoral artery and 035 versacore wire threaded into the abdominal aorta under fluoroscopic guidance. Next a 5 French sheath is placed over the guidewire and the left common femoral artery. A 5 French pigtail catheter was placed over the guidewire into the abdominal aorta and abdominal aortogram was obtained. The infrarenal abdominal aorta is patent. The left and right renal arteries are patent.  The left and right common internal and external iliac arteries are patent with some toruosity. Next, the pigtail catheter was removed over a guidewire this was exchanged for a 5 [place] crossover catheter. The crossover catheter was used to selectively catheterize the rightt common iliac artery and the guidewire advanced into the rightt distal external iliac artery. The crossover catheter was removed and replaced with a 5 French straight catheter. Angiogram was then performed the right lower extremity. This shows a patent common femoral superficial femoral and profunda femoris artery.  The popliteal artery is patent.  This is obscured in the AP projection by a prosthetic knee but a lateral projection was obtained to further clarify this.  There is calcification of the popliteal artery but no flow limiting stenosis.  The anterior and posterior tibial arteries are occluded.  The peroneal artery is patent but very diseased with multiple  areas of high grade stenosis.  There is a short segment of DP and PT artery reconstituted at the ankle but these are both obliterated in the foot.  There are only collaterals supplying the right foot  Next the 5 French straight catheter was removed over a guidewire. The 5 French sheath was accessed to obtain a left lower extremity runoff.  This shows a patent common femoral superficial femoral and profunda femoris artery.  The popliteal artery is patent.  This is obscured in the AP projection by a prosthetic knee.  There is calcification of the popliteal artery but no flow limiting stenosis.  The anterior and posterior tibial arteries are occluded. The tibioperoneal trunk is occluded.  The peroneal artery is patent but very diseased with multiple areas of high grade stenosis.  The DP and PT artery reconstituted at the ankle via the peroneal artery but these are very small vessels.  There is an intact plantar arch.  The 5 French sheath was left in place to be pulled in the holding area. The patient tolerated the procedure well and there were no complications. Patient was taken to the holding area in stable condition.  Operative management: The patient has unreconstructable tibial disease bilaterally.  Continue local wound care for right foot.  If continued deterioration of wound or invasive infection needs right below knee amputation  Fabienne Bruns, MD [facility] [place] Office: [phone] Pager: [phone]

## 2012-02-25 NOTE — H&P (View-Only) (Signed)
[facility] [place] HISTORY AND PHYSICAL   History of Present Illness:  Patient is a 78 y.o. year old male who presents for evaluation of a nonhealing wound of the right foot. The patient previously underwent a second toe amputation in November of 2012. This had some difficulty healing and had infection in it but did eventually heal spontaneously. He then had amputation of his right first toe approximately 3 weeks ago. This has failed to heal. He is currently being followed at the wound center by Dr. Parker and undergoing hyperbaric therapy.  His toe amputations were done by Dr. Bednarz.  He denies prior history of claudication or rest pain. He is a retired primary care physician. Other medical problems include diabetes, hypertension, degenerative arthritis, coronary artery disease. His cardiologist is Dr. Tom Wall.  He is currently on doxycycline and clindamycin.  Past Medical History  Diagnosis Date  . Diabetes mellitus   . BPH (benign prostatic hyperplasia)   . Arthritis   . GERD (gastroesophageal reflux disease)   . Neuromuscular disorder   . Hypertension     no meds  . Shortness of breath     uses cpap  . Peripheral vascular disease   . Failed total knee, right     Past Surgical History  Procedure Date  . Aortic valve replacement   . Foot arthrotomy   . Cardiac valve replacement   . Tonsillectomy   . Eye surgery     both cataracts  . Fracture surgery     lt huneruous  . Joint replacement     rt total knee  . Joint replacement     lt tiotal knee  . Toe amputation     rt 2nd  . Cardiac catheterization   . Amputation 01/19/2012    Procedure: AMPUTATION DIGIT;  Surgeon: Paul A Bednarz, MD;  Location: [facility];  Service: Orthopedics;  Laterality: Right;  right great toe amputation through MTP joint     Social History History  Substance Use Topics  . Smoking status: Never Smoker   . Smokeless tobacco: Not on file  . Alcohol Use: 4.2  oz/week    7 Glasses of wine per week    Family History Family History  Problem Relation Age of Onset  . Diabetes Mother   . Diabetes Father     Allergies  Allergies  Allergen Reactions  . Morphine And Related Nausea And Vomiting  . Succinylcholine   . Ace Inhibitors Anxiety     Current Outpatient Prescriptions  Medication Sig Dispense Refill  . aspirin 81 MG tablet Take 81 mg by mouth daily.        . Calcium Carbonate-Vitamin D (CALCIUM-VITAMIN D) 500-200 MG-UNIT per tablet Take 1 tablet by mouth daily.        . clindamycin (CLEOCIN) 300 MG capsule Take 300 mg by mouth QID.      . diclofenac (VOLTAREN) 75 MG EC tablet Take 75 mg by mouth 2 (two) times daily.        . doxycycline (VIBRAMYCIN) 100 MG capsule Take 100 mg by mouth 2 (two) times daily.      . dutasteride (AVODART) 0.5 MG capsule Take 0.5 mg by mouth daily.        . esomeprazole (NEXIUM) 40 MG capsule Take 40 mg by mouth daily before breakfast.        . ezetimibe (ZETIA) 10 MG tablet Take 10 mg by mouth daily.        .   FERREX 150 150 MG capsule take 1 capsule by mouth once daily  90 capsule  3  . fluticasone (FLONASE) 50 MCG/ACT nasal spray Place 2 sprays into the nose daily.  16 g  2  . GLIPIZIDE XL 10 MG 24 hr tablet take 2 tablets by mouth once daily  180 tablet  0  . HYDROcodone-acetaminophen (LORTAB) 10-500 MG per tablet Take 1 tablet by mouth every 6 (six) hours as needed.      . Multiple Vitamins-Minerals (CENTRUM SILVER ULTRA MENS) TABS Take 1 tablet by mouth daily.        . pioglitazone (ACTOS) 30 MG tablet Take 1 tablet (30 mg total) by mouth daily.  30 tablet  6  . rosuvastatin (CRESTOR) 10 MG tablet Take 10 mg by mouth daily.        . sitaGLIPtan-metformin (JANUMET) 50-1000 MG per tablet Take 1 tablet by mouth 2 (two) times daily with a meal.        . solifenacin (VESICARE) 10 MG tablet Take 1 tablet (10 mg total) by mouth daily.  30 tablet  2  . Tamsulosin HCl (FLOMAX) 0.4 MG CAPS Take 0.4 mg by mouth  daily.        . vitamin B-12 (CYANOCOBALAMIN) 1000 MCG tablet Take 1,000 mcg by mouth daily. Takes 1 tablet monthly         ROS:   General:  No weight loss, Fever, chills  HEENT: No recent headaches, no nasal bleeding, no visual changes, no sore throat  Neurologic: No dizziness, blackouts, seizures. No recent symptoms of stroke or mini- stroke. No recent episodes of slurred speech, or temporary blindness.  Cardiac: No recent episodes of chest pain/pressure, no shortness of breath at rest.  No shortness of breath with exertion.  Denies history of atrial fibrillation or irregular heartbeat  Vascular: No history of rest pain in feet.  No history of claudication.  No history of non-healing ulcer, No history of DVT   Pulmonary: No home oxygen, no productive cough, no hemoptysis,  No asthma or wheezing  Musculoskeletal:  [x ] Arthritis, [ ] Low back pain,  [x ] Joint pain  Hematologic:No history of hypercoagulable state.  No history of easy bleeding.  No history of anemia  Gastrointestinal: No hematochezia or melena,  No gastroesophageal reflux, no trouble swallowing  Urinary: [ ] chronic Kidney disease, [ ] on HD - [ ] MWF or [ ] TTHS, [ ] Burning with urination, [ ] Frequent urination, [ ] Difficulty urinating;   Skin: No rashes  Psychological: No history of anxiety,  No history of depression   Physical Examination  Filed Vitals:   02/17/12 1432  BP: 135/58  Pulse: 84  Resp: 16  Height: 5' 10" (1.778 m)  Weight: 172 lb (78.019 kg)  SpO2: 98%    Body mass index is 24.68 kg/(m^2).  General:  Alert and oriented, no acute distress HEENT: Normal Neck: No bruit or JVD Pulmonary: Clear to auscultation bilaterally Cardiac: Regular Rate and Rhythm without murmur Abdomen: Soft, non-tender, non-distended, no mass Skin: No rash, 3 cm ulcerated area at the distal metatarsal of the first toe right foot, 3 cm ulceration over the lateral fifth metatarsal, well-healed right second toe  amputation Extremity Pulses:  2+ radial, brachial, femoral, popliteal, absent dorsalis pedis, posterior tibial pulses bilaterally Musculoskeletal: No deformity or edema  Neurologic: Upper and lower extremity motor 5/5 and symmetric  DATA: Patient had bilateral ABIs performed today. His vessels were calcified to ABIs   cannot be determined. His waveforms were monophasic bilaterally. I reviewed and interpreted this study.   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 Risks benefits possible complications and procedure details including but not limited to bleeding infection contrast nephropathy were explained the patient today. Risk of vessel injury was also explained. He agrees to proceed.  Rueben Kassim, MD [facility] [place] Office: [phone] Pager: [phone]  

## 2012-02-28 ENCOUNTER — Other Ambulatory Visit: Payer: Self-pay | Admitting: Dermatology

## 2012-02-28 LAB — POCT I-STAT, CHEM 8
Calcium, Ion: 1.19 mmol/L (ref 1.12–1.32)
Chloride: 100 mEq/L (ref 96–112)
HCT: 39 % (ref 39.0–52.0)
Sodium: 133 mEq/L — ABNORMAL LOW (ref 135–145)
TCO2: 27 mmol/L (ref 0–100)

## 2012-03-01 LAB — GLUCOSE, CAPILLARY
Glucose-Capillary: 158 mg/dL — ABNORMAL HIGH (ref 70–99)
Glucose-Capillary: 158 mg/dL — ABNORMAL HIGH (ref 70–99)
Glucose-Capillary: 141 mg/dL — ABNORMAL HIGH (ref 70–99)

## 2012-03-02 LAB — GLUCOSE, CAPILLARY: Glucose-Capillary: 189 mg/dL — ABNORMAL HIGH (ref 70–99)

## 2012-03-06 DIAGNOSIS — I739 Peripheral vascular disease, unspecified: Secondary | ICD-10-CM

## 2012-03-06 HISTORY — DX: Peripheral vascular disease, unspecified: I73.9

## 2012-03-24 ENCOUNTER — Encounter (HOSPITAL_BASED_OUTPATIENT_CLINIC_OR_DEPARTMENT_OTHER): Payer: Medicare Other

## 2012-03-24 ENCOUNTER — Encounter (HOSPITAL_BASED_OUTPATIENT_CLINIC_OR_DEPARTMENT_OTHER): Payer: Medicare Other | Attending: General Surgery

## 2012-03-29 DIAGNOSIS — L97509 Non-pressure chronic ulcer of other part of unspecified foot with unspecified severity: Secondary | ICD-10-CM | POA: Insufficient documentation

## 2012-03-29 DIAGNOSIS — Y835 Amputation of limb(s) as the cause of abnormal reaction of the patient, or of later complication, without mention of misadventure at the time of the procedure: Secondary | ICD-10-CM | POA: Insufficient documentation

## 2012-03-29 DIAGNOSIS — Z79899 Other long term (current) drug therapy: Secondary | ICD-10-CM | POA: Insufficient documentation

## 2012-03-29 DIAGNOSIS — T8789 Other complications of amputation stump: Secondary | ICD-10-CM | POA: Insufficient documentation

## 2012-03-29 DIAGNOSIS — E1169 Type 2 diabetes mellitus with other specified complication: Secondary | ICD-10-CM | POA: Insufficient documentation

## 2012-04-03 ENCOUNTER — Encounter: Payer: Self-pay | Admitting: Family Medicine

## 2012-04-11 ENCOUNTER — Ambulatory Visit (INDEPENDENT_AMBULATORY_CARE_PROVIDER_SITE_OTHER): Payer: Medicare Other | Admitting: Family Medicine

## 2012-04-11 ENCOUNTER — Encounter: Payer: Self-pay | Admitting: Family Medicine

## 2012-04-11 VITALS — BP 116/64 | HR 71 | Temp 97.9°F | Ht 68.0 in | Wt 185.0 lb

## 2012-04-11 DIAGNOSIS — E538 Deficiency of other specified B group vitamins: Secondary | ICD-10-CM

## 2012-04-11 DIAGNOSIS — N138 Other obstructive and reflux uropathy: Secondary | ICD-10-CM

## 2012-04-11 DIAGNOSIS — N401 Enlarged prostate with lower urinary tract symptoms: Secondary | ICD-10-CM

## 2012-04-11 DIAGNOSIS — N139 Obstructive and reflux uropathy, unspecified: Secondary | ICD-10-CM

## 2012-04-11 DIAGNOSIS — I2510 Atherosclerotic heart disease of native coronary artery without angina pectoris: Secondary | ICD-10-CM

## 2012-04-11 DIAGNOSIS — E119 Type 2 diabetes mellitus without complications: Secondary | ICD-10-CM

## 2012-04-11 DIAGNOSIS — I739 Peripheral vascular disease, unspecified: Secondary | ICD-10-CM

## 2012-04-11 DIAGNOSIS — I10 Essential (primary) hypertension: Secondary | ICD-10-CM

## 2012-04-11 DIAGNOSIS — D649 Anemia, unspecified: Secondary | ICD-10-CM

## 2012-04-11 LAB — LIPID PANEL
Cholesterol: 195 mg/dL (ref 0–200)
HDL: 57.1 mg/dL (ref >39.00)
LDL Cholesterol: 110 mg/dL — ABNORMAL HIGH (ref 0–99)
VLDL: 27.6 mg/dL (ref 0.0–40.0)

## 2012-04-11 LAB — POCT URINALYSIS DIPSTICK
Glucose, UA: NEGATIVE
Leukocytes, UA: NEGATIVE
Nitrite, UA: NEGATIVE

## 2012-04-11 LAB — BASIC METABOLIC PANEL
BUN: 27 mg/dL — ABNORMAL HIGH (ref 6–23)
Calcium: 9.1 mg/dL (ref 8.4–10.5)
GFR: 95.03 mL/min (ref >60.00)
Glucose, Bld: 74 mg/dL (ref 70–99)

## 2012-04-11 LAB — CBC WITH DIFFERENTIAL/PLATELET
Basophils Relative: 0.6 % (ref 0.0–3.0)
Eosinophils Absolute: 0.3 10*3/uL (ref 0.0–0.7)
Lymphocytes Relative: 21.1 % (ref 12.0–46.0)
MCHC: 32.4 g/dL (ref 30.0–36.0)
Neutrophils Relative %: 63 % (ref 43.0–77.0)
RBC: 4.54 Mil/uL (ref 4.22–5.81)
WBC: 8 10*3/uL (ref 4.5–10.5)

## 2012-04-11 LAB — HEMOGLOBIN A1C: Hgb A1c MFr Bld: 6.9 % — ABNORMAL HIGH (ref 4.6–6.5)

## 2012-04-11 LAB — HEPATIC FUNCTION PANEL: Albumin: 3.7 g/dL (ref 3.5–5.2)

## 2012-04-11 LAB — MICROALBUMIN / CREATININE URINE RATIO: Creatinine,U: 186.2 mg/dL

## 2012-04-11 LAB — FERRITIN: Ferritin: 14.5 ng/mL — ABNORMAL LOW (ref 22.0–322.0)

## 2012-04-11 LAB — TSH: TSH: 0.65 u[IU]/mL (ref 0.35–5.50)

## 2012-04-11 MED ORDER — DIPHENOXYLATE-ATROPINE 2.5-0.025 MG PO TABS: 1.0000 | ORAL_TABLET | Freq: Four times a day (QID) | ORAL | Status: AC | PRN

## 2012-04-11 MED ORDER — PROCHLORPERAZINE MALEATE 10 MG PO TABS: 10.0000 mg | ORAL_TABLET | Freq: Four times a day (QID) | ORAL | Status: DC | PRN

## 2012-04-11 MED ORDER — PIOGLITAZONE HCL 30 MG PO TABS: 30.0000 mg | ORAL_TABLET | Freq: Every day | ORAL | Status: DC

## 2012-04-11 MED ORDER — DICLOFENAC SODIUM 75 MG PO TBEC: 75.0000 mg | DELAYED_RELEASE_TABLET | Freq: Two times a day (BID) | ORAL | Status: DC

## 2012-04-11 MED ORDER — GLIPIZIDE ER 10 MG PO TB24: 20.0000 mg | ORAL_TABLET | Freq: Every day | ORAL | Status: DC

## 2012-04-11 MED ORDER — EZETIMIBE 10 MG PO TABS: 10.0000 mg | ORAL_TABLET | Freq: Every day | ORAL | Status: DC

## 2012-04-11 MED ORDER — SITAGLIPTIN PHOS-METFORMIN HCL 50-1000 MG PO TABS: 1.0000 | ORAL_TABLET | Freq: Two times a day (BID) | ORAL | Status: DC

## 2012-04-11 MED ORDER — ROSUVASTATIN CALCIUM 10 MG PO TABS: 10.0000 mg | ORAL_TABLET | Freq: Every day | ORAL | Status: DC

## 2012-04-11 MED ORDER — ESOMEPRAZOLE MAGNESIUM 40 MG PO CPDR: 40.0000 mg | DELAYED_RELEASE_CAPSULE | Freq: Every day | ORAL | Status: DC

## 2012-04-11 NOTE — Progress Notes (Signed)
  Subjective:    Patient ID: Stanley [name], male    DOB: 11-Sep-1925, 78 y.o.   MRN: [id number]  HPI Here to follow up on multiple issues. He is doing well in general, while his main problem remains the amputation site on the right foot. This area has been extremely slow to heal, but he says it is progressing steadily. He still has some pain in the foot at night, especially if he has been up much during the day. He met with Dr. Lestine Box a few weeks ago to consider the possibility of more surgery, but he did not think this was needed. He wants to simply give it more time. Annette Stable has completed about 40 some rounds of HBO treatments, and these helped, however he had to stop when insurance would no longer cover them. He is getting around better, driving himself. In general he feels well, no chest pains or SOB. He is due to see Dr. Daleen Squibb soon for a cardiologic exam. Annette Stable and his wife hope to fly to the [place] in a few weeks for a wedding. His fasting glucoses are steady in the range of 110 to 130. BP is stable.    Review of Systems  Constitutional: Negative.   Respiratory: Negative.   Cardiovascular: Negative.   Gastrointestinal: Negative.   Neurological: Negative.        Objective:   Physical Exam  Constitutional: He is oriented to person, place, and time. He appears well-developed and well-nourished.       Walks with a cane, wearing a fracture shoe on the right foot   Neck: No thyromegaly present.  Cardiovascular: Normal rate, regular rhythm, normal heart sounds and intact distal pulses.  Exam reveals no gallop and no friction rub.        Soft 1/6 SM over the aortic area   Pulmonary/Chest: Effort normal and breath sounds normal. No respiratory distress. He has no wheezes. He has no rales.  Musculoskeletal: He exhibits no edema.  Lymphadenopathy:    He has no cervical adenopathy.  Neurological: He is alert and oriented to person, place, and time.  Psychiatric: He has a normal mood  and affect. His behavior is normal. Thought content normal.          Assessment & Plan:  He seems to be doing well overall. Hopefully the foot will continue to slowly heal. Check fasting labs today.

## 2012-04-28 ENCOUNTER — Encounter (HOSPITAL_BASED_OUTPATIENT_CLINIC_OR_DEPARTMENT_OTHER): Payer: Medicare Other | Attending: General Surgery

## 2012-04-28 DIAGNOSIS — S98139A Complete traumatic amputation of one unspecified lesser toe, initial encounter: Secondary | ICD-10-CM | POA: Insufficient documentation

## 2012-04-28 DIAGNOSIS — L97509 Non-pressure chronic ulcer of other part of unspecified foot with unspecified severity: Secondary | ICD-10-CM | POA: Insufficient documentation

## 2012-04-28 DIAGNOSIS — E1169 Type 2 diabetes mellitus with other specified complication: Secondary | ICD-10-CM | POA: Insufficient documentation

## 2012-05-02 ENCOUNTER — Other Ambulatory Visit: Payer: Self-pay | Admitting: Family Medicine

## 2012-05-12 ENCOUNTER — Ambulatory Visit (INDEPENDENT_AMBULATORY_CARE_PROVIDER_SITE_OTHER): Payer: Medicare Other | Admitting: Family Medicine

## 2012-05-12 ENCOUNTER — Encounter: Payer: Self-pay | Admitting: Family Medicine

## 2012-05-12 VITALS — BP 118/70 | HR 68 | Temp 97.7°F

## 2012-05-12 DIAGNOSIS — B029 Zoster without complications: Secondary | ICD-10-CM

## 2012-05-12 HISTORY — DX: Zoster without complications: B02.9

## 2012-05-12 MED ORDER — GLIPIZIDE ER 10 MG PO TB24: 20.0000 mg | ORAL_TABLET | Freq: Every day | ORAL | Status: DC

## 2012-05-12 MED ORDER — VALACYCLOVIR HCL 1 G PO TABS: 1000.0000 mg | ORAL_TABLET | Freq: Three times a day (TID) | ORAL | Status: DC

## 2012-05-12 MED ORDER — METHYLPREDNISOLONE 4 MG PO KIT: PACK | ORAL | Status: AC

## 2012-05-12 NOTE — Progress Notes (Signed)
  Subjective:    Patient ID: Stanley [name], male    DOB: Oct 29, 1925, 78 y.o.   MRN: [id number]  HPI Here for what he thinks is shingles. About 5 days ago he developed tingling and burning in the right buttock. This then progressed to more of a pain which radiated down the back of the right thigh. Then 3 days ago a rash appeared in this area.    Review of Systems  Constitutional: Negative.   Skin: Positive for rash.       Objective:   Physical Exam  Constitutional: He appears well-developed and well-nourished.  Skin:       Red vesicular rash on the right buttock and down the right posterior thigh           Assessment & Plan:  Treat with steroids and Valtrex. Recheck prn

## 2012-05-23 ENCOUNTER — Encounter (HOSPITAL_BASED_OUTPATIENT_CLINIC_OR_DEPARTMENT_OTHER): Payer: Medicare Other | Attending: General Surgery

## 2012-05-23 DIAGNOSIS — S98139A Complete traumatic amputation of one unspecified lesser toe, initial encounter: Secondary | ICD-10-CM | POA: Insufficient documentation

## 2012-05-23 DIAGNOSIS — E1169 Type 2 diabetes mellitus with other specified complication: Secondary | ICD-10-CM | POA: Insufficient documentation

## 2012-05-23 DIAGNOSIS — L97509 Non-pressure chronic ulcer of other part of unspecified foot with unspecified severity: Secondary | ICD-10-CM | POA: Insufficient documentation

## 2012-06-02 ENCOUNTER — Encounter (HOSPITAL_BASED_OUTPATIENT_CLINIC_OR_DEPARTMENT_OTHER): Payer: Medicare Other

## 2012-06-21 ENCOUNTER — Encounter (HOSPITAL_BASED_OUTPATIENT_CLINIC_OR_DEPARTMENT_OTHER): Payer: Medicare Other | Attending: General Surgery

## 2012-06-21 DIAGNOSIS — S98119A Complete traumatic amputation of unspecified great toe, initial encounter: Secondary | ICD-10-CM | POA: Insufficient documentation

## 2012-06-21 DIAGNOSIS — L97509 Non-pressure chronic ulcer of other part of unspecified foot with unspecified severity: Secondary | ICD-10-CM | POA: Insufficient documentation

## 2012-06-21 DIAGNOSIS — E1169 Type 2 diabetes mellitus with other specified complication: Secondary | ICD-10-CM | POA: Insufficient documentation

## 2012-06-28 ENCOUNTER — Other Ambulatory Visit (HOSPITAL_BASED_OUTPATIENT_CLINIC_OR_DEPARTMENT_OTHER): Payer: Self-pay | Admitting: General Surgery

## 2012-06-28 ENCOUNTER — Ambulatory Visit (HOSPITAL_COMMUNITY)
Admission: RE | Admit: 2012-06-28 | Discharge: 2012-06-28 | Disposition: A | Discharge status: Home / Self Care | Payer: Medicare Other | Source: Ambulatory Visit | Attending: General Surgery | Admitting: General Surgery

## 2012-06-28 DIAGNOSIS — S91309A Unspecified open wound, unspecified foot, initial encounter: Secondary | ICD-10-CM | POA: Insufficient documentation

## 2012-06-28 DIAGNOSIS — M869 Osteomyelitis, unspecified: Secondary | ICD-10-CM

## 2012-06-28 DIAGNOSIS — E119 Type 2 diabetes mellitus without complications: Secondary | ICD-10-CM | POA: Insufficient documentation

## 2012-06-28 DIAGNOSIS — M948X9 Other specified disorders of cartilage, unspecified sites: Secondary | ICD-10-CM | POA: Insufficient documentation

## 2012-06-28 DIAGNOSIS — X58XXXA Exposure to other specified factors, initial encounter: Secondary | ICD-10-CM | POA: Insufficient documentation

## 2012-06-28 DIAGNOSIS — S98139A Complete traumatic amputation of one unspecified lesser toe, initial encounter: Secondary | ICD-10-CM | POA: Insufficient documentation

## 2012-06-29 ENCOUNTER — Other Ambulatory Visit (HOSPITAL_COMMUNITY): Payer: Self-pay | Admitting: Orthopedic Surgery

## 2012-06-29 ENCOUNTER — Ambulatory Visit (HOSPITAL_COMMUNITY)
Admission: RE | Admit: 2012-06-29 | Discharge: 2012-06-29 | Disposition: A | Discharge status: Home / Self Care | Payer: Medicare Other | Source: Ambulatory Visit | Attending: Orthopedic Surgery | Admitting: Orthopedic Surgery

## 2012-06-29 ENCOUNTER — Inpatient Hospital Stay (HOSPITAL_COMMUNITY)
Admission: RE | Admit: 2012-06-29 | Discharge: 2012-06-29 | Disposition: A | Discharge status: Home / Self Care | Payer: Medicare Other | Source: Ambulatory Visit

## 2012-06-29 ENCOUNTER — Other Ambulatory Visit (HOSPITAL_COMMUNITY): Payer: Self-pay | Admitting: *Deleted

## 2012-06-29 DIAGNOSIS — S91339A Puncture wound without foreign body, unspecified foot, initial encounter: Secondary | ICD-10-CM

## 2012-06-29 DIAGNOSIS — L089 Local infection of the skin and subcutaneous tissue, unspecified: Secondary | ICD-10-CM | POA: Insufficient documentation

## 2012-06-29 MED ORDER — LIDOCAINE HCL 1 % IJ SOLN
INTRAMUSCULAR | Status: AC
  Filled 2012-06-29: qty 20

## 2012-06-29 MED ORDER — SODIUM CHLORIDE 0.9 % IV SOLN
Freq: Once | INTRAVENOUS | Status: AC
  Administered 2012-06-29: 15:00:00 via INTRAVENOUS

## 2012-06-29 MED ORDER — VANCOMYCIN HCL IN DEXTROSE 1-5 GM/200ML-% IV SOLN
INTRAVENOUS | Status: AC
  Filled 2012-06-29: qty 200

## 2012-06-29 MED ORDER — HEPARIN SOD (PORK) LOCK FLUSH 100 UNIT/ML IV SOLN: 250.0000 [IU] | INTRAVENOUS | Status: DC | PRN

## 2012-06-29 MED ORDER — HEPARIN SOD (PORK) LOCK FLUSH 100 UNIT/ML IV SOLN
INTRAVENOUS | Status: AC
  Filled 2012-06-29: qty 5

## 2012-06-29 MED ORDER — VANCOMYCIN HCL IN DEXTROSE 1-5 GM/200ML-% IV SOLN
1000.0000 mg | Freq: Once | INTRAVENOUS | Status: AC
  Administered 2012-06-29: 1000 mg via INTRAVENOUS

## 2012-06-29 NOTE — Procedures (Signed)
US/fluoroscopic guided right SL brachial vein PICC placed. Length 38 cm. Tip SVC/RA junction. No immediate complications.

## 2012-06-29 NOTE — Progress Notes (Signed)
piccline flushed with 10cc ns then 250 units heparin per central line protocol. Order for heparin lock flush in order history but not in sign and held.

## 2012-07-18 ENCOUNTER — Encounter: Payer: Self-pay | Admitting: *Deleted

## 2012-07-25 ENCOUNTER — Ambulatory Visit (INDEPENDENT_AMBULATORY_CARE_PROVIDER_SITE_OTHER): Payer: Medicare Other | Admitting: Cardiology

## 2012-07-25 ENCOUNTER — Encounter: Payer: Self-pay | Admitting: Cardiology

## 2012-07-25 VITALS — BP 126/64 | HR 72 | Ht 68.0 in | Wt 186.0 lb

## 2012-07-25 DIAGNOSIS — I440 Atrioventricular block, first degree: Secondary | ICD-10-CM

## 2012-07-25 DIAGNOSIS — I6529 Occlusion and stenosis of unspecified carotid artery: Secondary | ICD-10-CM

## 2012-07-25 DIAGNOSIS — I739 Peripheral vascular disease, unspecified: Secondary | ICD-10-CM

## 2012-07-25 DIAGNOSIS — I2510 Atherosclerotic heart disease of native coronary artery without angina pectoris: Secondary | ICD-10-CM

## 2012-07-25 DIAGNOSIS — L98499 Non-pressure chronic ulcer of skin of other sites with unspecified severity: Secondary | ICD-10-CM

## 2012-07-25 DIAGNOSIS — E119 Type 2 diabetes mellitus without complications: Secondary | ICD-10-CM

## 2012-07-25 DIAGNOSIS — I441 Atrioventricular block, second degree: Secondary | ICD-10-CM

## 2012-07-25 DIAGNOSIS — G4733 Obstructive sleep apnea (adult) (pediatric): Secondary | ICD-10-CM

## 2012-07-25 DIAGNOSIS — I10 Essential (primary) hypertension: Secondary | ICD-10-CM

## 2012-07-25 DIAGNOSIS — Z954 Presence of other heart-valve replacement: Secondary | ICD-10-CM

## 2012-07-25 DIAGNOSIS — E785 Hyperlipidemia, unspecified: Secondary | ICD-10-CM

## 2012-07-25 NOTE — Assessment & Plan Note (Signed)
Last carotids were 2011. He had nonobstructive plaque. We'll reschedule for a surveillance scan. Continue secondary preventative therapy.

## 2012-07-25 NOTE — Assessment & Plan Note (Signed)
Stable status post coronary bypass grafting x2. Note however he has silent ischemia before.

## 2012-07-25 NOTE — Addendum Note (Signed)
Addended by: Lisabeth Devoid F on: 07/25/2012 03:04 PM   Modules accepted: Orders

## 2012-07-25 NOTE — Assessment & Plan Note (Signed)
Stable. Return the office in one year. 

## 2012-07-25 NOTE — Assessment & Plan Note (Signed)
Most of this small vessel disease. Continue to follow up with the wound Center.

## 2012-07-25 NOTE — Patient Instructions (Addendum)
Your physician recommends that you continue on your current medications as directed. Please refer to the Current Medication list given to you today.  Your physician recommends that you schedule a follow-up appointment in: 1year with Dr. Wall.  

## 2012-07-25 NOTE — Progress Notes (Signed)
HPI Stanley [name] returns today for followup of his history of coronary artery disease, history of coronary bypass grafting and aortic valve replacement. Since I last saw him, he had a bad fall and broke ribs and his left arm. He then had an unhealed ulcer on his right foot. This subsequently resulted in an amputation of the second and first toe. It was felt to be secondary to microvascular disease with no obstructive large vessel disease.  He is still on IV antibiotics and has a foot brace. He is followed in the wound Center.  He is having no angina, dyspnea on exertion, orthopnea, palpitations, or edema.  Past Medical History  Diagnosis Date  . Diabetes mellitus   . BPH (benign prostatic hyperplasia)   . Arthritis   . GERD (gastroesophageal reflux disease)   . Neuromuscular disorder   . Hypertension     no meds  . Shortness of breath     uses cpap  . 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 Failed total knee, right     Current Outpatient Prescriptions  Medication Sig Dispense Refill  . aspirin 325 MG tablet Take 325 mg by mouth daily.      . Calcium Carbonate-Vitamin D (CALCIUM-VITAMIN D) 500-200 MG-UNIT per tablet Take 1 tablet by mouth daily.        . clindamycin (CLEOCIN) 300 MG capsule Take 300 mg by mouth 4 (four) times daily.       . diclofenac (VOLTAREN) 75 MG EC tablet Take 1 tablet (75 mg total) by mouth 2 (two) times daily.  180 tablet  3  . doxycycline (VIBRAMYCIN) 100 MG capsule Take 100 mg by mouth 2 (two) times daily.      Marland Kitchen dutasteride (AVODART) 0.5 MG capsule Take 0.5 mg by mouth daily.        Marland Kitchen esomeprazole (NEXIUM) 40 MG capsule Take 1 capsule (40 mg total) by mouth daily before breakfast.  90 capsule  3  . ezetimibe (ZETIA) 10 MG tablet Take 1 tablet (10 mg total) by mouth daily.  90 tablet  3  . FERREX 150 150 MG capsule take 1 capsule by mouth once daily  30 capsule  11  . glipiZIDE  (GLUCOTROL XL) 10 MG 24 hr tablet Take 2 tablets (20 mg total) by mouth daily.  180 tablet  3  . HYDROcodone-acetaminophen (LORTAB) 10-500 MG per tablet Take 1 tablet by mouth every 6 (six) hours as needed. For pain.      . Multiple Vitamin (MULITIVITAMIN WITH MINERALS) TABS Take 1 tablet by mouth daily.      . pioglitazone (ACTOS) 30 MG tablet Take 1 tablet (30 mg total) by mouth daily.  90 tablet  3  . prochlorperazine (COMPAZINE) 10 MG tablet Take 1 tablet (10 mg total) by mouth 4 (four) times daily as needed (nausea). For nausea.  100 tablet  3  . rosuvastatin (CRESTOR) 10 MG tablet Take 1 tablet (10 mg total) by mouth daily.  90 tablet  3  . sitaGLIPtan-metformin (JANUMET) 50-1000 MG per tablet Take 1 tablet by mouth 2 (two) times daily with a meal.  180 tablet  3  . solifenacin (VESICARE) 10 MG tablet Take 10 mg by mouth daily.      . Tamsulosin HCl (FLOMAX) 0.4 MG CAPS Take 0.4 mg by mouth daily.        Marland Kitchen DISCONTD: prochlorperazine (COMPAZINE) 10 MG tablet Take 1 tablet (10 mg  total) by mouth every 6 (six) hours as needed.  60 tablet  5    Allergies  Allergen Reactions  . Morphine And Related Nausea And Vomiting  . Succinylcholine Other (See Comments)    unknown  . Ace Inhibitors Other (See Comments)    cough    Family History  Problem Relation Age of Onset  . Diabetes Mother   . Diabetes Father     History   Social History  . Marital Status: Married    Spouse Name: N/A    Number of Children: N/A  . Years of Education: N/A   Occupational History  . Not on file.   Social History Main Topics  . Smoking status: Never Smoker   . Smokeless tobacco: Never Used  . Alcohol Use: 4.2 oz/week    7 Glasses of wine per week  . Drug Use: No  . Sexually Active: Not on file   Other Topics Concern  . Not on file   Social History Narrative  . No narrative on file    ROS ALL NEGATIVE EXCEPT THOSE NOTED IN HPI  PE  General Appearance: well developed, well nourished in no  acute distress HEENT: symmetrical face, PERRLA, good dentition  Neck: no JVD, thyromegaly, or adenopathy, trachea midline Chest: symmetric without deformity Cardiac: PMI non-displaced, RRR, normal S1, S2, no gallop, soft systolic murmur, S2 splits Lung: clear to ausculation and percussion Vascular: all pulses full without bruits  Abdominal: nondistended, nontender, good bowel sounds, no HSM, no bruits Extremities: no cyanosis, wearing boot on the right foot, posterior tibial pulses are palpable bilaterally, no sign of DVT, no varicosities  Skin: normal color, no rashes Neuro: alert and oriented x 3, non-focal Pysch: normal affect  EKG Normal sinus rhythm with a first-degree block, no acute changes. BMET    Component Value Date/Time   NA 135 04/11/2012 1259   K 4.9 04/11/2012 1259   CL 101 04/11/2012 1259   CO2 25 04/11/2012 1259   GLUCOSE 74 04/11/2012 1259   GLUCOSE 152* 09/29/2006 1129   BUN 27* 04/11/2012 1259   CREATININE 0.8 04/11/2012 1259   CALCIUM 9.1 04/11/2012 1259   GFRNONAA 88* 10/09/2011 1605   GFRAA >90 10/09/2011 1605    Lipid Panel     Component Value Date/Time   CHOL 195 04/11/2012 1259   TRIG 138.0 04/11/2012 1259   HDL 57.10 04/11/2012 1259   CHOLHDL 3 04/11/2012 1259   VLDL 27.6 04/11/2012 1259   LDLCALC 110* 04/11/2012 1259    CBC    Component Value Date/Time   WBC 8.0 04/11/2012 1259   RBC 4.54 04/11/2012 1259   HGB 12.5* 04/11/2012 1259   HCT 38.7* 04/11/2012 1259   PLT 284.0 04/11/2012 1259   MCV 85.1 04/11/2012 1259   MCH 30.8 10/10/2011 1700   MCHC 32.4 04/11/2012 1259   RDW 16.2* 04/11/2012 1259   LYMPHSABS 1.7 04/11/2012 1259   MONOABS 0.9 04/11/2012 1259   EOSABS 0.3 04/11/2012 1259   BASOSABS 0.0 04/11/2012 1259

## 2012-07-26 ENCOUNTER — Encounter (HOSPITAL_BASED_OUTPATIENT_CLINIC_OR_DEPARTMENT_OTHER): Payer: Medicare Other | Attending: General Surgery

## 2012-07-26 DIAGNOSIS — L97509 Non-pressure chronic ulcer of other part of unspecified foot with unspecified severity: Secondary | ICD-10-CM | POA: Insufficient documentation

## 2012-07-26 DIAGNOSIS — E1169 Type 2 diabetes mellitus with other specified complication: Secondary | ICD-10-CM | POA: Insufficient documentation

## 2012-07-26 DIAGNOSIS — S98119A Complete traumatic amputation of unspecified great toe, initial encounter: Secondary | ICD-10-CM | POA: Insufficient documentation

## 2012-07-31 NOTE — Addendum Note (Signed)
Addended by: Micki Riley C on: 07/31/2012 03:54 PM   Modules accepted: Orders

## 2012-08-11 ENCOUNTER — Other Ambulatory Visit (INDEPENDENT_AMBULATORY_CARE_PROVIDER_SITE_OTHER): Payer: Medicare Other

## 2012-08-11 DIAGNOSIS — E119 Type 2 diabetes mellitus without complications: Secondary | ICD-10-CM

## 2012-08-11 DIAGNOSIS — D649 Anemia, unspecified: Secondary | ICD-10-CM

## 2012-08-11 DIAGNOSIS — E538 Deficiency of other specified B group vitamins: Secondary | ICD-10-CM

## 2012-08-11 LAB — CBC WITH DIFFERENTIAL/PLATELET
Basophils Relative: 0.6 % (ref 0.0–3.0)
Eosinophils Absolute: 0.4 10*3/uL (ref 0.0–0.7)
MCHC: 32.5 g/dL (ref 30.0–36.0)
MCV: 88.6 fl (ref 78.0–100.0)
Monocytes Absolute: 1.1 10*3/uL — ABNORMAL HIGH (ref 0.1–1.0)
Neutrophils Relative %: 65.9 % (ref 43.0–77.0)
Platelets: 263 10*3/uL (ref 150.0–400.0)

## 2012-08-14 MED ORDER — TAMSULOSIN HCL 0.4 MG PO CAPS: 0.4000 mg | ORAL_CAPSULE | Freq: Every day | ORAL | Status: DC

## 2012-08-14 NOTE — Progress Notes (Signed)
Quick Note:  I spoke with pt and gave results. ______ 

## 2012-08-25 ENCOUNTER — Encounter (HOSPITAL_BASED_OUTPATIENT_CLINIC_OR_DEPARTMENT_OTHER): Payer: Medicare Other | Attending: General Surgery

## 2012-08-25 DIAGNOSIS — S98139A Complete traumatic amputation of one unspecified lesser toe, initial encounter: Secondary | ICD-10-CM | POA: Insufficient documentation

## 2012-08-25 DIAGNOSIS — L97509 Non-pressure chronic ulcer of other part of unspecified foot with unspecified severity: Secondary | ICD-10-CM | POA: Insufficient documentation

## 2012-08-25 DIAGNOSIS — E1169 Type 2 diabetes mellitus with other specified complication: Secondary | ICD-10-CM | POA: Insufficient documentation

## 2012-09-22 ENCOUNTER — Encounter (HOSPITAL_BASED_OUTPATIENT_CLINIC_OR_DEPARTMENT_OTHER): Payer: Medicare Other

## 2012-09-22 ENCOUNTER — Encounter (HOSPITAL_BASED_OUTPATIENT_CLINIC_OR_DEPARTMENT_OTHER): Payer: Medicare Other | Attending: General Surgery

## 2012-09-22 DIAGNOSIS — L97509 Non-pressure chronic ulcer of other part of unspecified foot with unspecified severity: Secondary | ICD-10-CM | POA: Insufficient documentation

## 2012-09-22 DIAGNOSIS — E1169 Type 2 diabetes mellitus with other specified complication: Secondary | ICD-10-CM | POA: Insufficient documentation

## 2012-10-20 ENCOUNTER — Encounter (HOSPITAL_BASED_OUTPATIENT_CLINIC_OR_DEPARTMENT_OTHER): Payer: Medicare Other | Attending: General Surgery

## 2012-10-20 DIAGNOSIS — L03119 Cellulitis of unspecified part of limb: Secondary | ICD-10-CM | POA: Insufficient documentation

## 2012-10-20 DIAGNOSIS — E1169 Type 2 diabetes mellitus with other specified complication: Secondary | ICD-10-CM | POA: Insufficient documentation

## 2012-10-20 DIAGNOSIS — L02419 Cutaneous abscess of limb, unspecified: Secondary | ICD-10-CM | POA: Insufficient documentation

## 2012-10-20 DIAGNOSIS — L97509 Non-pressure chronic ulcer of other part of unspecified foot with unspecified severity: Secondary | ICD-10-CM | POA: Insufficient documentation

## 2012-11-09 ENCOUNTER — Ambulatory Visit: Payer: Self-pay | Admitting: Family Medicine

## 2012-11-24 ENCOUNTER — Encounter (HOSPITAL_BASED_OUTPATIENT_CLINIC_OR_DEPARTMENT_OTHER): Payer: Medicare Other

## 2012-11-24 ENCOUNTER — Encounter (HOSPITAL_BASED_OUTPATIENT_CLINIC_OR_DEPARTMENT_OTHER): Payer: Medicare Other | Attending: General Surgery

## 2012-11-24 DIAGNOSIS — L97209 Non-pressure chronic ulcer of unspecified calf with unspecified severity: Secondary | ICD-10-CM | POA: Insufficient documentation

## 2012-11-24 DIAGNOSIS — E1169 Type 2 diabetes mellitus with other specified complication: Secondary | ICD-10-CM | POA: Insufficient documentation

## 2012-11-24 DIAGNOSIS — L97509 Non-pressure chronic ulcer of other part of unspecified foot with unspecified severity: Secondary | ICD-10-CM | POA: Insufficient documentation

## 2012-12-01 ENCOUNTER — Ambulatory Visit (INDEPENDENT_AMBULATORY_CARE_PROVIDER_SITE_OTHER): Payer: Medicare Other | Admitting: Family Medicine

## 2012-12-01 DIAGNOSIS — Z23 Encounter for immunization: Secondary | ICD-10-CM

## 2012-12-07 ENCOUNTER — Other Ambulatory Visit (INDEPENDENT_AMBULATORY_CARE_PROVIDER_SITE_OTHER): Payer: Medicare Other

## 2012-12-07 DIAGNOSIS — E119 Type 2 diabetes mellitus without complications: Secondary | ICD-10-CM

## 2012-12-08 LAB — HEMOGLOBIN A1C: Hgb A1c MFr Bld: 7.1 % — ABNORMAL HIGH (ref 4.6–6.5)

## 2012-12-18 ENCOUNTER — Ambulatory Visit (INDEPENDENT_AMBULATORY_CARE_PROVIDER_SITE_OTHER): Payer: Medicare Other | Admitting: Physician Assistant

## 2012-12-18 ENCOUNTER — Encounter: Payer: Self-pay | Admitting: Physician Assistant

## 2012-12-18 ENCOUNTER — Other Ambulatory Visit (INDEPENDENT_AMBULATORY_CARE_PROVIDER_SITE_OTHER): Payer: Medicare Other

## 2012-12-18 VITALS — BP 104/66 | HR 100 | Ht 68.0 in | Wt 170.0 lb

## 2012-12-18 DIAGNOSIS — R634 Abnormal weight loss: Secondary | ICD-10-CM

## 2012-12-18 DIAGNOSIS — R1013 Epigastric pain: Secondary | ICD-10-CM

## 2012-12-18 LAB — COMPREHENSIVE METABOLIC PANEL
ALT: 17 U/L (ref 0–53)
AST: 16 U/L (ref 0–37)
Albumin: 3.4 g/dL — ABNORMAL LOW (ref 3.5–5.2)
Alkaline Phosphatase: 96 U/L (ref 39–117)
Glucose, Bld: 231 mg/dL — ABNORMAL HIGH (ref 70–99)
Potassium: 4.5 mEq/L (ref 3.5–5.1)
Sodium: 136 mEq/L (ref 135–145)
Total Protein: 7.2 g/dL (ref 6.0–8.3)

## 2012-12-18 NOTE — Progress Notes (Signed)
Subjective:    Patient ID: Stanley [name], male    DOB: April 13, 1925, 78 y.o.   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 He says he's been under an increased amount of stress as his wife has been ill and had to have surgery and is now in a rehabilitation facility. He says he lost his appetite around that time. He denies any dysphagia or odynophagia no regular heartburn or indigestion and has been on Nexium 40 mg daily long-term. He says he started noticing some aching type pain in his stomach especially when it is empty a couple of weeks ago and also says he feels more uncomfortable if he eats a larger meal. He vomited once and has had some vague nausea intermittently. He denies any changes in his bowel habits but says his stools are always dark as he has been on iron. He generally had been taking a 325 mg aspirin but says over the past couple of months he had decreased at 81 mg daily and has held it over the past week. He had also been taking Voltaren regularly over the past few years and has held that since his stomach started hurting.  His other medical problems include Coronary artery disease- he is status post CABG. and also an aortic valve replacement, he has carotid stenosis. adult onset diabetes mellitus, sleep apnea, first-degree AV block .and had a fairly recent amputation of his first and second toe on the right foot due to a nonhealing ulcer.    Review of Systems  Constitutional: Positive for appetite change and unexpected weight change.  HENT: Negative.   Eyes: Negative.   Respiratory: Negative.   Cardiovascular: Negative.   Gastrointestinal: Positive for nausea and abdominal pain.  Genitourinary: Negative.   Musculoskeletal: Positive for arthralgias  and gait problem.  Neurological: Negative.   Hematological: Negative.   Psychiatric/Behavioral: Negative.    Outpatient Prescriptions Prior to Visit  Medication Sig Dispense Refill  . aspirin 325 MG tablet Take 325 mg by mouth daily.      . Calcium Carbonate-Vitamin D (CALCIUM-VITAMIN D) 500-200 MG-UNIT per tablet Take 1 tablet by mouth 2 (two) times daily.       . clindamycin (CLEOCIN) 300 MG capsule Take 300 mg by mouth 4 (four) times daily.       . diclofenac (VOLTAREN) 75 MG EC tablet Take 1 tablet (75 mg total) by mouth 2 (two) times daily.  180 tablet  3  . doxycycline (VIBRAMYCIN) 100 MG capsule Take 100 mg by mouth 2 (two) times daily.      Marland Kitchen dutasteride (AVODART) 0.5 MG capsule Take 0.5 mg by mouth daily.        Marland Kitchen esomeprazole (NEXIUM) 40 MG capsule Take 1 capsule (40 mg total) by mouth daily before breakfast.  90 capsule  3  . FERREX 150 150 MG capsule take 1 capsule by mouth once daily  30 capsule  11  . glipiZIDE (GLUCOTROL XL) 10 MG 24 hr tablet Take 2 tablets (20 mg total) by mouth daily.  180 tablet  3  . HYDROcodone-acetaminophen (LORTAB) 10-500 MG per tablet Take 1 tablet by mouth every 6 (six) hours as needed. For pain.      . Multiple Vitamin (MULITIVITAMIN WITH MINERALS)  TABS Take 1 tablet by mouth daily.      . pioglitazone (ACTOS) 30 MG tablet Take 1 tablet (30 mg total) by mouth daily.  90 tablet  3  . prochlorperazine (COMPAZINE) 10 MG tablet Take 1 tablet (10 mg total) by mouth 4 (four) times daily as needed (nausea). For nausea.  100 tablet  3  . rosuvastatin (CRESTOR) 10 MG tablet Take 1 tablet (10 mg total) by mouth daily.  90 tablet  3  . sitaGLIPtan-metformin (JANUMET) 50-1000 MG per tablet Take 1 tablet by mouth 2 (two) times daily with a meal.  180 tablet  3  . solifenacin (VESICARE) 10 MG tablet Take 10 mg by mouth daily.      . Tamsulosin HCl (FLOMAX) 0.4 MG CAPS Take 1 capsule (0.4 mg total) by mouth daily.  90 capsule  3  . [DISCONTINUED] ezetimibe (ZETIA)  10 MG tablet Take 1 tablet (10 mg total) by mouth daily.  90 tablet  3   Last reviewed on 12/18/2012  4:01 PM by Sammuel Cooper, PA  Allergies  Allergen Reactions  . Morphine And Related Nausea And Vomiting  . Succinylcholine Other (See Comments)    unknown  . Ace Inhibitors Other (See Comments)    cough   Patient Active Problem List  Diagnosis  . HYPOTHYROIDISM  . DIABETES MELLITUS, TYPE II  . HYPERLIPIDEMIA  . ANEMIA  . OBSTRUCTIVE SLEEP APNEA  . HYPERTENSION  . CAD, NATIVE VESSEL  . SECOND DEGREE AV HEART BLOCK, MOBITZ I  . CAROTID ARTERY DISEASE  . HYPOTENSION, UNSPECIFIED  . UTI  . ELEVATED PROSTATE SPECIFIC ANTIGEN  . UNS ADVRS EFF OTH RX MEDICINAL&BIOLOGICAL SBSTNC  . AORTIC VALVE REPLACEMENT, HX OF  . Atherosclerosis of native arteries of the extremities with ulceration  . First degree AV block   History  Substance Use Topics  . Smoking status: Never Smoker   . Smokeless tobacco: Never Used  . Alcohol Use: 4.2 oz/week    7 Glasses of wine per week   family history includes Diabetes in his father and mother.     Objective:   Physical Exam well-developed elderly white male in no acute distress, very pleasant blood pressure 104/66 pulse 100 height 5 foot 8 weight 170. HEENT; nontraumatic normocephalic EOMI PERRLA sclera anicteric, Neck;supple no JVD, Cardiovascular; regular rate and rhythm with S1-S2 soft systolic murmur, Pulmonary; clear bilaterally, Abdomen; soft bowel sounds are active there is no palpable mass or hepatosplenomegaly he is tender in the epigastrium without guarding or rebound, Rectal; exam not done, Extremities; no clubbing cyanosis or edema skin warm and dry- he does have a boot  on the right foot. Psych; mood and affect normal and appropriate        Assessment & Plan:  #71 78 year old male with 2 month history of gradual weight loss of approximately 10 pounds and two-week history of epigastric pain. Patient has been on chronic NSAIDs and  aspirin and is therefore at increased risk for gastropathy or peptic ulcer disease . #2 adult-onset diabetes mellitus #3 coronary artery disease status post CABG #4 status post aortic valve replacement-tissue valve #5 sleep apnea #6 carotid stenoses #7 status post recent amputation of the first and second toe of the right foot secondary to nonhealing ulcer  Plan; He has already stopped his aspirin and Voltaren and have advised him to stay off of both of these for now Increase Nexium to 40 mg by mouth twice daily Schedule for upper endoscopy with  Dr. Russella Dar later this week-procedure discussed in detail with the patient and he is agreeable to proceed. CBC and CMET today If EGD is unrevealing will proceed with CT scan of the abdomen and pelvis

## 2012-12-18 NOTE — Patient Instructions (Addendum)
Please go to the basement level to have your labs drawn.  Increase the Nexium 40 mg to twice daily.  You have been scheduled for an endoscopy with propofol. Please follow written instructions given to you at your visit today. If you use inhalers (even only as needed) or a CPAP machine, please bring them with you on the day of your procedure.

## 2012-12-19 LAB — CBC WITH DIFFERENTIAL/PLATELET
Basophils Absolute: 0 10*3/uL (ref 0.0–0.1)
Eosinophils Relative: 3.1 % (ref 0.0–5.0)
MCV: 88.8 fl (ref 78.0–100.0)
Monocytes Absolute: 0.9 10*3/uL (ref 0.1–1.0)
Neutrophils Relative %: 70.4 % (ref 43.0–77.0)
Platelets: 303 10*3/uL (ref 150.0–400.0)
WBC: 10 10*3/uL (ref 4.5–10.5)

## 2012-12-19 NOTE — Progress Notes (Signed)
I saw [name] and obtained a history. Reviewed and agree with management plan.  Venita Lick. Russella Dar, MD [facility]

## 2012-12-21 ENCOUNTER — Encounter: Payer: Self-pay | Admitting: Gastroenterology

## 2012-12-21 ENCOUNTER — Ambulatory Visit (AMBULATORY_SURGERY_CENTER): Payer: Medicare Other | Admitting: Gastroenterology

## 2012-12-21 VITALS — BP 126/73 | HR 67 | Temp 97.9°F | Resp 26 | Ht 68.0 in | Wt 170.0 lb

## 2012-12-21 DIAGNOSIS — K259 Gastric ulcer, unspecified as acute or chronic, without hemorrhage or perforation: Secondary | ICD-10-CM

## 2012-12-21 DIAGNOSIS — R634 Abnormal weight loss: Secondary | ICD-10-CM

## 2012-12-21 DIAGNOSIS — K2990 Gastroduodenitis, unspecified, without bleeding: Secondary | ICD-10-CM

## 2012-12-21 DIAGNOSIS — A048 Other specified bacterial intestinal infections: Secondary | ICD-10-CM

## 2012-12-21 DIAGNOSIS — R1013 Epigastric pain: Secondary | ICD-10-CM

## 2012-12-21 DIAGNOSIS — K2970 Gastritis, unspecified, without bleeding: Secondary | ICD-10-CM

## 2012-12-21 DIAGNOSIS — I959 Hypotension, unspecified: Secondary | ICD-10-CM

## 2012-12-21 LAB — GLUCOSE, CAPILLARY
Glucose-Capillary: 70 mg/dL (ref 70–99)
Glucose-Capillary: 148 mg/dL — ABNORMAL HIGH (ref 70–99)

## 2012-12-21 MED ORDER — SODIUM CHLORIDE 0.9 % IV SOLN: 500.0000 mL | INTRAVENOUS | Status: DC

## 2012-12-21 NOTE — Patient Instructions (Addendum)
YOU HAD AN ENDOSCOPIC PROCEDURE TODAY AT THE [facility]: Refer to the procedure report that was given to you for any specific questions about what was found during the examination.  If the procedure report does not answer your questions, please call your gastroenterologist to clarify.  If you requested that your care partner not be given the details of your procedure findings, then the procedure report has been included in a sealed envelope for you to review at your convenience later.  YOU SHOULD EXPECT: Some feelings of bloating in the abdomen. Passage of more gas than usual.  Walking can help get rid of the air that was put into your GI tract during the procedure and reduce the bloating. If you had a lower endoscopy (such as a colonoscopy or flexible sigmoidoscopy) you may notice spotting of blood in your stool or on the toilet paper. If you underwent a bowel prep for your procedure, then you may not have a normal bowel movement for a few days.  DIET: Your first meal following the procedure should be a light meal and then it is ok to progress to your normal diet.  A half-sandwich or bowl of soup is an example of a good first meal.  Heavy or fried foods are harder to digest and may make you feel nauseous or bloated.  Likewise meals heavy in dairy and vegetables can cause extra gas to form and this can also increase the bloating.  Drink plenty of fluids but you should avoid alcoholic beverages for 24 hours.  ACTIVITY: Your care partner should take you home directly after the procedure.  You should plan to take it easy, moving slowly for the rest of the day.  You can resume normal activity the day after the procedure however you should NOT DRIVE or use heavy machinery for 24 hours (because of the sedation medicines used during the test).    SYMPTOMS TO REPORT IMMEDIATELY: A gastroenterologist can be reached at any hour.  During normal business hours, 8:30 AM to 5:00 PM Monday through Friday,  call [phone].  After hours and on weekends, please call the GI answering service at [phone] who will take a message and have the physician on call contact you.   Following upper endoscopy (EGD)  Vomiting of blood or coffee ground material  New chest pain or pain under the shoulder blades  Painful or persistently difficult swallowing  New shortness of breath  Fever of 100F or higher  Black, tarry-looking stools  FOLLOW UP: If any biopsies were taken you will be contacted by phone or by letter within the next 1-3 weeks.  Call your gastroenterologist if you have not heard about the biopsies in 3 weeks.  Our staff will call the home number listed on your records the next business day following your procedure to check on you and address any questions or concerns that you may have at that time regarding the information given to you following your procedure. This is a courtesy call and so if there is no answer at the home number and we have not heard from you through the emergency physician on call, we will assume that you have returned to your regular daily activities without incident.     Information on gastritis given to you today  Avoid aspirin and anti inflammatory products for 4 weeks then ok to resume aspirin 81 mg if recommended by you primary physician  Anti reflux medication twice daily x 8 weeks then every  am long term  Endoscopy again in 3 months   SIGNATURES/CONFIDENTIALITY: You and/or your care partner have signed paperwork which will be entered into your electronic medical record.  These signatures attest to the fact that that the information above on your After Visit Summary has been reviewed and is understood.  Full responsibility of the confidentiality of this discharge information lies with you and/or your care-partner.     Call end of February to sched 3 mos endoscopy per Cordelia Pen , recall put in system for April 1st .

## 2012-12-21 NOTE — Progress Notes (Signed)
Propofol given over incremental dosages 

## 2012-12-21 NOTE — Progress Notes (Signed)
Patient did not experience any of the following events: a burn prior to discharge; a fall within the facility; wrong site/side/patient/procedure/implant event; or a hospital transfer or hospital admission upon discharge from the facility. (G8907) Patient did not have preoperative order for IV antibiotic SSI prophylaxis. (G8918)  

## 2012-12-21 NOTE — Op Note (Signed)
[facility] [address] [place], [address]   ENDOSCOPY PROCEDURE REPORT  PATIENT: Stanley [name], Stanley [name]  MR#: [id number] BIRTHDATE: [birth year] , 87  yrs. old GENDER: Male ENDOSCOPIST: Meryl Dare, MD, [facility] PROCEDURE DATE:  12/21/2012 PROCEDURE:  EGD w/ biopsy ASA CLASS:     Class III INDICATIONS:  Epigastric pain.   Weight loss. MEDICATIONS: MAC sedation, administered by CRNA and propofol (Diprivan) 60mg  IV TOPICAL ANESTHETIC: Cetacaine Spray DESCRIPTION OF PROCEDURE: After the risks benefits and alternatives of the procedure were thoroughly explained, informed consent was obtained.  The [facility] [id number] endoscope was introduced through the mouth and advanced to the second portion of the duodenum. Without limitations.  The instrument was slowly withdrawn as the mucosa was fully examined.  STOMACH: Two non-bleeding clean-based ulcers were found at the pylorus. The larger measured 15 mm X 10 mm and extended into the duodenal bulb. The smaller measured 3 mm.  Biopsies were taken around the larger ulcer. Mild gastritis (inflammation) was found in the gastric body and gastric fundus.  Multiple biopsies were performed.   The stomach otherwise appeared normal. ESOPHAGUS: The mucosa of the esophagus appeared normal. DUODENUM: The pyloric channel ulcer extended into the duodenal bulb. The duodenal mucosa otherwise showed no abnormalities in the bulb and second portion of the duodenum.  Retroflexed views revealed no abnormalities.     The scope was then withdrawn from the patient and the procedure completed.  COMPLICATIONS: There were no complications.  ENDOSCOPIC IMPRESSION: 1.   Two non-bleeding ulcers were found at the pylorus 2.   Gastritis (inflammation) was found in the gastric body and gastric fundus; multiple biopsies  RECOMMENDATIONS: 1.  Avoid ASA/NSAIDSfor 4 weeks then ok to resume ASA 81 mg if recommended by his PCP 2.  Await pathology  results 3.  PPI bid for 8 weeks then qam long term 4.  Endoscopy in 3 months   eSigned:  Meryl Dare, MD, [facility] 12/21/2012 9:54 AM

## 2012-12-21 NOTE — Progress Notes (Signed)
Called to room to assist during endoscopic procedure.  Patient ID and intended procedure confirmed with present staff. Received instructions for my participation in the procedure from the performing physician.  

## 2012-12-22 ENCOUNTER — Encounter (HOSPITAL_BASED_OUTPATIENT_CLINIC_OR_DEPARTMENT_OTHER): Payer: Medicare Other | Attending: General Surgery

## 2012-12-22 ENCOUNTER — Telehealth: Payer: Self-pay | Admitting: *Deleted

## 2012-12-22 DIAGNOSIS — L97809 Non-pressure chronic ulcer of other part of unspecified lower leg with unspecified severity: Secondary | ICD-10-CM | POA: Insufficient documentation

## 2012-12-22 DIAGNOSIS — E1169 Type 2 diabetes mellitus with other specified complication: Secondary | ICD-10-CM | POA: Insufficient documentation

## 2012-12-22 DIAGNOSIS — L84 Corns and callosities: Secondary | ICD-10-CM | POA: Insufficient documentation

## 2012-12-22 NOTE — Telephone Encounter (Signed)
  Follow up Call-  Call back number 12/21/2012  Post procedure Call Back phone  # [phone]  Permission to leave phone message Yes     Patient questions:  Do you have a fever, pain , or abdominal swelling? no Pain Score  0 *  Have you tolerated food without any problems? yes  Have you been able to return to your normal activities? yes  Do you have any questions about your discharge instructions: Diet   no Medications  no Follow up visit  no  Do you have questions or concerns about your Care? no  Actions: * If pain score is 4 or above: No action needed, pain <4.

## 2012-12-26 ENCOUNTER — Encounter: Payer: Self-pay | Admitting: Gastroenterology

## 2012-12-27 ENCOUNTER — Other Ambulatory Visit: Payer: Self-pay

## 2012-12-27 MED ORDER — PYLERA 140-125-125 MG PO CAPS: 3.0000 | ORAL_CAPSULE | Freq: Four times a day (QID) | ORAL | Status: DC

## 2013-01-08 ENCOUNTER — Telehealth: Payer: Self-pay | Admitting: Family Medicine

## 2013-01-08 MED ORDER — PREDNISONE 10 MG PO TABS: 10.0000 mg | ORAL_TABLET | Freq: Every day | ORAL | Status: DC

## 2013-01-08 NOTE — Telephone Encounter (Signed)
He is asking to try Prednisone for his joint pains since he can no longer take any NSAIDs. We will start on 10 mg a day and see how this works. He knows to monitor his blood glucoses closely.

## 2013-01-11 ENCOUNTER — Encounter: Payer: Self-pay | Admitting: Gastroenterology

## 2013-01-22 ENCOUNTER — Other Ambulatory Visit: Payer: Self-pay

## 2013-01-22 MED ORDER — ESOMEPRAZOLE MAGNESIUM 40 MG PO CPDR: 40.0000 mg | DELAYED_RELEASE_CAPSULE | Freq: Two times a day (BID) | ORAL | Status: DC

## 2013-01-26 ENCOUNTER — Encounter (HOSPITAL_BASED_OUTPATIENT_CLINIC_OR_DEPARTMENT_OTHER): Payer: Medicare Other | Attending: General Surgery

## 2013-01-26 DIAGNOSIS — E1169 Type 2 diabetes mellitus with other specified complication: Secondary | ICD-10-CM | POA: Insufficient documentation

## 2013-01-26 DIAGNOSIS — L97809 Non-pressure chronic ulcer of other part of unspecified lower leg with unspecified severity: Secondary | ICD-10-CM | POA: Insufficient documentation

## 2013-01-31 ENCOUNTER — Ambulatory Visit: Payer: Self-pay | Admitting: Gastroenterology

## 2013-01-31 ENCOUNTER — Encounter: Payer: Self-pay | Admitting: Gastroenterology

## 2013-01-31 ENCOUNTER — Ambulatory Visit (INDEPENDENT_AMBULATORY_CARE_PROVIDER_SITE_OTHER): Payer: Medicare Other | Admitting: Gastroenterology

## 2013-01-31 VITALS — BP 118/60 | HR 80 | Ht 68.0 in | Wt 178.4 lb

## 2013-01-31 DIAGNOSIS — K253 Acute gastric ulcer without hemorrhage or perforation: Secondary | ICD-10-CM | POA: Insufficient documentation

## 2013-01-31 NOTE — Progress Notes (Signed)
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 Ulcer was diagnosed on January 2. H. pylori was identified and treated. He continues on Nexium 40 mg twice daily. His abdominal pain has resolved and his appetite has improved. He notes a slight weight gain with his improved appetite. He has significant problems with arthritis and back pain are currently managed with prednisone 10 mg daily and hydrocodone.  Current Medications, Allergies, Past Medical History, Past Surgical History, Family History and Social History were reviewed in [employer] record.  Physical Exam: General: Well developed , well nourished, no acute distress Head: Normocephalic and atraumatic Eyes:  sclerae anicteric, EOMI Ears: Normal auditory acuity Mouth: No deformity or lesions Lungs: Clear throughout to auscultation Heart: Regular rate and rhythm; no murmurs, rubs or bruits Abdomen: Soft, non tender and non distended. No masses, hepatosplenomegaly or hernias noted. Normal Bowel sounds Musculoskeletal: Symmetrical with no gross deformities  Pulses:  Normal pulses noted Neurological: Alert oriented x 4, grossly nonfocal Psychological:  Alert and cooperative. Normal mood and affect  Assessment and Recommendations:  1. Gastric ulcer, large. H. pylori positive, treated. Continue Nexium 40 mg twice daily. Consider changing to omeprazole after his ulcer heals. Continue to avoid NSAIDs long-term. If aspirin 81 mg daily is felt to be needed for cardiovascular reasons this can be resumed however I recommend that he avoid the use of any additional aspirin or NSAID products long-term. Schedule followup endoscopy at 12-14 weeks. The risks, benefits, and alternatives to endoscopy with possible biopsy and possible dilation were discussed with the patient and they consent to proceed.

## 2013-01-31 NOTE — Patient Instructions (Addendum)
You have been scheduled for an endoscopy with propofol. Please follow written instructions given to you at your visit today. If you use inhalers (even only as needed) or a CPAP machine, please bring them with you on the day of your procedure. 

## 2013-02-21 ENCOUNTER — Encounter (HOSPITAL_BASED_OUTPATIENT_CLINIC_OR_DEPARTMENT_OTHER): Payer: Medicare Other | Attending: General Surgery

## 2013-02-21 DIAGNOSIS — L84 Corns and callosities: Secondary | ICD-10-CM | POA: Insufficient documentation

## 2013-02-21 DIAGNOSIS — E1169 Type 2 diabetes mellitus with other specified complication: Secondary | ICD-10-CM | POA: Insufficient documentation

## 2013-02-21 DIAGNOSIS — L97509 Non-pressure chronic ulcer of other part of unspecified foot with unspecified severity: Secondary | ICD-10-CM | POA: Insufficient documentation

## 2013-02-27 ENCOUNTER — Other Ambulatory Visit (HOSPITAL_BASED_OUTPATIENT_CLINIC_OR_DEPARTMENT_OTHER): Payer: Self-pay | Admitting: General Surgery

## 2013-02-27 ENCOUNTER — Ambulatory Visit (HOSPITAL_COMMUNITY)
Admission: RE | Admit: 2013-02-27 | Discharge: 2013-02-27 | Disposition: A | Discharge status: Home / Self Care | Payer: Medicare Other | Source: Ambulatory Visit | Attending: General Surgery | Admitting: General Surgery

## 2013-02-27 DIAGNOSIS — S92919A Unspecified fracture of unspecified toe(s), initial encounter for closed fracture: Secondary | ICD-10-CM | POA: Insufficient documentation

## 2013-02-27 DIAGNOSIS — X58XXXA Exposure to other specified factors, initial encounter: Secondary | ICD-10-CM | POA: Insufficient documentation

## 2013-02-27 DIAGNOSIS — M869 Osteomyelitis, unspecified: Secondary | ICD-10-CM

## 2013-03-01 ENCOUNTER — Telehealth: Payer: Self-pay | Admitting: Family Medicine

## 2013-03-01 MED ORDER — SOLIFENACIN SUCCINATE 10 MG PO TABS: 10.0000 mg | ORAL_TABLET | Freq: Every day | ORAL | Status: DC

## 2013-03-01 MED ORDER — SITAGLIPTIN PHOS-METFORMIN HCL 50-1000 MG PO TABS: 1.0000 | ORAL_TABLET | Freq: Two times a day (BID) | ORAL | Status: DC

## 2013-03-01 NOTE — Telephone Encounter (Signed)
Refill request for Janumet & Vesicare ( 90 day supply )

## 2013-03-01 NOTE — Telephone Encounter (Signed)
Per Dr. Clent Ridges okay to send in and I did e-scribe

## 2013-03-09 ENCOUNTER — Ambulatory Visit (INDEPENDENT_AMBULATORY_CARE_PROVIDER_SITE_OTHER): Payer: Medicare Other | Admitting: Family Medicine

## 2013-03-09 ENCOUNTER — Encounter: Payer: Self-pay | Admitting: Family Medicine

## 2013-03-09 ENCOUNTER — Ambulatory Visit: Payer: Medicare Other

## 2013-03-09 VITALS — BP 124/60 | HR 80 | Temp 98.8°F | Wt 178.0 lb

## 2013-03-09 DIAGNOSIS — E039 Hypothyroidism, unspecified: Secondary | ICD-10-CM

## 2013-03-09 DIAGNOSIS — I779 Disorder of arteries and arterioles, unspecified: Secondary | ICD-10-CM

## 2013-03-09 DIAGNOSIS — E119 Type 2 diabetes mellitus without complications: Secondary | ICD-10-CM

## 2013-03-09 DIAGNOSIS — D649 Anemia, unspecified: Secondary | ICD-10-CM

## 2013-03-09 DIAGNOSIS — R42 Dizziness and giddiness: Secondary | ICD-10-CM

## 2013-03-09 DIAGNOSIS — I10 Essential (primary) hypertension: Secondary | ICD-10-CM

## 2013-03-09 DIAGNOSIS — Z954 Presence of other heart-valve replacement: Secondary | ICD-10-CM

## 2013-03-09 LAB — CBC WITH DIFFERENTIAL/PLATELET
Basophils Absolute: 0 10*3/uL (ref 0.0–0.1)
Basophils Relative: 0 % (ref 0–1)
Eosinophils Absolute: 0.1 10*3/uL (ref 0.0–0.7)
Eosinophils Relative: 1 % (ref 0–5)
MCH: 27.5 pg (ref 26.0–34.0)
MCHC: 31.4 g/dL (ref 30.0–36.0)
MCV: 87.6 fL (ref 78.0–100.0)
Neutrophils Relative %: 83 % — ABNORMAL HIGH (ref 43–77)
Platelets: 310 10*3/uL (ref 150–400)
RBC: 4.43 MIL/uL (ref 4.22–5.81)
RDW: 14.2 % (ref 11.5–15.5)

## 2013-03-09 LAB — HEPATIC FUNCTION PANEL
ALT: 25 U/L (ref 0–53)
AST: 19 U/L (ref 0–37)
Albumin: 4 g/dL (ref 3.5–5.2)
Alkaline Phosphatase: 84 U/L (ref 39–117)
Total Protein: 7.5 g/dL (ref 6.0–8.3)

## 2013-03-09 LAB — BASIC METABOLIC PANEL
BUN: 32 mg/dL — ABNORMAL HIGH (ref 6–23)
CO2: 23 mEq/L (ref 19–32)
GFR: 87.4 mL/min (ref >60.00)
Glucose, Bld: 262 mg/dL — ABNORMAL HIGH (ref 70–99)
Potassium: 5 mEq/L (ref 3.5–5.1)

## 2013-03-09 MED ORDER — DULOXETINE HCL 30 MG PO CPEP: 30.0000 mg | ORAL_CAPSULE | Freq: Every day | ORAL | Status: DC

## 2013-03-09 NOTE — Progress Notes (Signed)
  Subjective:    Patient ID: Stanley [name], male    DOB: [birth year], 79 y.o.   MRN: [id number]  HPI Here to discuss several issues. First he has had some mild lightheadedness over the past few weeks which is more evident in the mornings and less so later in the day. This is not vertigo. No chest pain or SOB or palpitations. Also he mentions some mild depression symptoms lately of feeling sad and feeling a lack of motivation. He denies any suicidal thoughts. He sleeps well. Appetite is good. He has been under a lot of stress this winter from caring for his wife, who recently got back home after an extended rehab stay for injuries she sustained form a fall. His right foot infection has not really healed at all, though it is stable, and this is very frustrating to him. He cannot wear regular shoes, and he has been using a walker to get around. He has always been very independent and this frustrates him.    Review of Systems  Constitutional: Positive for fatigue. Negative for appetite change and unexpected weight change.  Respiratory: Negative.   Cardiovascular: Negative.   Gastrointestinal: Negative.   Neurological: Positive for light-headedness. Negative for dizziness, tremors, syncope, speech difficulty, weakness, numbness and headaches.       Objective:   Physical Exam  Constitutional: He is oriented to person, place, and time. He appears well-developed and well-nourished. No distress.  Neck: No thyromegaly present.  Cardiovascular: Normal rate, regular rhythm and intact distal pulses.  Exam reveals no gallop and no friction rub.   Soft 2/6 SM over the aortic area   Pulmonary/Chest: Breath sounds normal. No respiratory distress. He has no wheezes. He has no rales.  Lymphadenopathy:    He has no cervical adenopathy.  Neurological: He is alert and oriented to person, place, and time.          Assessment & Plan:  We noted that he has been taking both Vesicare and Flomax for awhile,  and these can both have lightheadedness as a side effect. We agreed to stop them both for a few weeks to see if he feels better. If he develops urinary problems we may add one of them back on, but I do not think he should be taking them both. Get labs today to follow up on anemia, diabetes, etc. We will set up carotid dopplers soon. He will try Cymbalta for mild depression, and we will recheck in 3-4 weeks

## 2013-03-09 NOTE — Addendum Note (Signed)
Addended by: Gershon Crane A on: 03/09/2013 01:11 PM   Modules accepted: Orders

## 2013-03-12 NOTE — Progress Notes (Signed)
Quick Note:  I spoke with pt ______ 

## 2013-03-15 ENCOUNTER — Encounter (INDEPENDENT_AMBULATORY_CARE_PROVIDER_SITE_OTHER): Payer: Medicare Other

## 2013-03-15 DIAGNOSIS — I6529 Occlusion and stenosis of unspecified carotid artery: Secondary | ICD-10-CM

## 2013-03-15 DIAGNOSIS — R42 Dizziness and giddiness: Secondary | ICD-10-CM

## 2013-03-19 ENCOUNTER — Ambulatory Visit (INDEPENDENT_AMBULATORY_CARE_PROVIDER_SITE_OTHER): Payer: Medicare Other | Admitting: Cardiology

## 2013-03-19 ENCOUNTER — Encounter: Payer: Self-pay | Admitting: Cardiology

## 2013-03-19 VITALS — BP 118/58 | HR 86 | Ht 68.0 in | Wt 176.0 lb

## 2013-03-19 DIAGNOSIS — R42 Dizziness and giddiness: Secondary | ICD-10-CM

## 2013-03-19 DIAGNOSIS — I2510 Atherosclerotic heart disease of native coronary artery without angina pectoris: Secondary | ICD-10-CM

## 2013-03-19 DIAGNOSIS — Z954 Presence of other heart-valve replacement: Secondary | ICD-10-CM

## 2013-03-19 DIAGNOSIS — I6529 Occlusion and stenosis of unspecified carotid artery: Secondary | ICD-10-CM

## 2013-03-19 DIAGNOSIS — I10 Essential (primary) hypertension: Secondary | ICD-10-CM

## 2013-03-19 NOTE — Patient Instructions (Addendum)
Your physician recommends that you continue on your current medications as directed. Please refer to the Current Medication list given to you today.  Your physician wants you to follow-up in: 6 months. You will receive a reminder letter in the mail two months in advance. If you don't receive a letter, please call our office to schedule the follow-up appointment.  

## 2013-03-19 NOTE — Assessment & Plan Note (Signed)
Sounds like prerenal azotemia. I've encouraged to drink more fluids particularly water. He'll let us know if he is to continue to have troubles.

## 2013-03-19 NOTE — Progress Notes (Signed)
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 He also has carotid artery disease which is nonobstructive and he also has peripheral vascular disease.  He's been having a lot of problems with the PVD. He has had chronic wound issues the right foot which is close to getting healed.  His biggest problem has been some lightheadedness when he is up and about. He has some issues with balance from long-standing mycin antibiotics. He says this is different. Blood work by Dr. Clent Ridges recently showed some prerenal azotemia. He is not on any blood pressure meds and diuretics. His blood pressures been low in the past we had to stop an ACE inhibitor.  Carotid Dopplers on Friday showed nonobstructive disease with good blood flow in both vertebrals.  Past Medical History  Diagnosis Date  . Diabetes mellitus   . BPH (benign prostatic hyperplasia)   . Arthritis   . GERD (gastroesophageal reflux disease)   . Neuromuscular disorder   . Hypertension     no meds  . Shortness of breath     uses cpap  . 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 Failed total knee, right   . H pylori ulcer     Gastric ulcer    Current Outpatient Prescriptions  Medication Sig Dispense Refill  . Calcium Carbonate-Vitamin D (CALCIUM-VITAMIN D) 500-200 MG-UNIT per tablet Take 1 tablet by mouth 2 (two) times daily.       . clindamycin (CLEOCIN) 300 MG capsule Take 300 mg by mouth 4 (four) times daily.       . DULoxetine (CYMBALTA) 30 MG capsule Take 1 capsule (30 mg total) by mouth daily.  30 capsule  5  . dutasteride (AVODART) 0.5 MG capsule Take 0.5 mg by mouth daily.        Marland Kitchen esomeprazole (NEXIUM) 40 MG capsule Take 1 capsule (40 mg total) by mouth 2 (two) times daily.  60 capsule  11  . FERREX 150 150 MG capsule  take 1 capsule by mouth once daily  30 capsule  11  . glipiZIDE (GLUCOTROL XL) 10 MG 24 hr tablet Take 2 tablets (20 mg total) by mouth daily.  180 tablet  3  . HYDROcodone-acetaminophen (LORTAB) 10-500 MG per tablet Take 1 tablet by mouth every 6 (six) hours as needed. 10/325      . Multiple Vitamin (MULITIVITAMIN WITH MINERALS) TABS Take 1 tablet by mouth daily.      . pioglitazone (ACTOS) 30 MG tablet Take 1 tablet (30 mg total) by mouth daily.  90 tablet  3  . predniSONE (DELTASONE) 10 MG tablet Take 1 tablet (10 mg total) by mouth daily.  90 tablet  3  . prochlorperazine (COMPAZINE) 10 MG tablet Take 1 tablet (10 mg total) by mouth 4 (four) times daily as needed (nausea). For nausea.  100 tablet  3  . rosuvastatin (CRESTOR) 10 MG tablet Take 1 tablet (10 mg total) by mouth daily.  90 tablet  3  . sitaGLIPtan-metformin (JANUMET) 50-1000 MG per tablet Take 1 tablet by mouth 2 (two) times daily with a meal.  180 tablet  3  . solifenacin (VESICARE) 10 MG tablet Take 1 tablet (10 mg total) by mouth daily.  90 tablet  3  . sulfamethoxazole-trimethoprim (BACTRIM DS) 800-160 MG per tablet Take 2 tablets by mouth 2 (two)  times daily.      . [DISCONTINUED] solifenacin (VESICARE) 10 MG tablet Take 10 mg by mouth daily.       No current facility-administered medications for this visit.    Allergies  Allergen Reactions  . Morphine And Related Nausea And Vomiting  . Succinylcholine Other (See Comments)    unknown  . Ace Inhibitors Other (See Comments)    cough    Family History  Problem Relation Age of Onset  . Diabetes Mother     type 2  . Diabetes Father     type 2  . Colon cancer Neg Hx   . Esophageal cancer Neg Hx   . Rectal cancer Neg Hx   . Stomach cancer Neg Hx     History   Social History  . Marital Status: Married    Spouse Name: N/A    Number of Children: N/A  . Years of Education: N/A   Occupational History  . Not on file.   Social History Main Topics  . Smoking  status: Never Smoker   . Smokeless tobacco: Never Used  . Alcohol Use: 4.2 oz/week    7 Glasses of wine per week  . Drug Use: No  . Sexually Active: Not on file   Other Topics Concern  . Not on file   Social History Narrative  . No narrative on file    ROS ALL NEGATIVE EXCEPT THOSE NOTED IN HPI  PE  General Appearance: well developed, well nourished in no acute distress HEENT: symmetrical face, PERRLA, good dentition  Neck: no JVD, thyromegaly, or adenopathy, trachea midline Chest: symmetric without deformity Cardiac: PMI non-displaced, RRR, normal S1, S2, no gallop , soft systolic murmur left sternal border Lung: clear to ausculation and percussion Vascular: all pulses full without bruits  Abdominal: nondistended, nontender, good bowel sounds, no HSM, no bruits Extremities: no cyanosis, clubbing or edema, no sign of DVT, no varicosities , palpable posterior tibial pulses but reduced. He's wearing a boot on the right foot. Skin: normal color, no rashes Neuro: alert and oriented x 3, non-focal Pysch: normal affect  EKG Normal sinus rhythm with first-degree AV block with occasional PVCs. No acute changes.  BMET    Component Value Date/Time   NA 130* 03/09/2013 1245   K 5.0 03/09/2013 1245   CL 96 03/09/2013 1245   CO2 23 03/09/2013 1245   GLUCOSE 262* 03/09/2013 1245   GLUCOSE 152* 09/29/2006 1129   BUN 32* 03/09/2013 1245   CREATININE 0.9 03/09/2013 1245   CALCIUM 9.1 03/09/2013 1245   GFRNONAA 88* 10/09/2011 1605   GFRAA >90 10/09/2011 1605    Lipid Panel     Component Value Date/Time   CHOL 195 04/11/2012 1259   TRIG 138.0 04/11/2012 1259   HDL 57.10 04/11/2012 1259   CHOLHDL 3 04/11/2012 1259   VLDL 27.6 04/11/2012 1259   LDLCALC 110* 04/11/2012 1259    CBC    Component Value Date/Time   WBC 12.8* 03/09/2013 1637   RBC 4.43 03/09/2013 1637   HGB 12.2* 03/09/2013 1637   HCT 38.8* 03/09/2013 1637   PLT 310 03/09/2013 1637   MCV 87.6 03/09/2013 1637   MCH 27.5  03/09/2013 1637   MCHC 31.4 03/09/2013 1637   RDW 14.2 03/09/2013 1637   LYMPHSABS 1.2 03/09/2013 1637   MONOABS 0.8 03/09/2013 1637   EOSABS 0.1 03/09/2013 1637   BASOSABS 0.0 03/09/2013 1637

## 2013-03-19 NOTE — Assessment & Plan Note (Signed)
Stable. Continue conservative observation in the office. I've scheduled him for followup with Dr. Jearld Pies in one year

## 2013-03-19 NOTE — Assessment & Plan Note (Signed)
Nonobstructive disease. Continue secondary preventative therapy.

## 2013-03-19 NOTE — Assessment & Plan Note (Signed)
Stable. Continue secondary preventative therapy. 

## 2013-03-20 NOTE — Progress Notes (Signed)
Quick Note:  I left voice message with results. ______ 

## 2013-03-21 ENCOUNTER — Encounter (HOSPITAL_BASED_OUTPATIENT_CLINIC_OR_DEPARTMENT_OTHER): Payer: Medicare Other | Attending: General Surgery

## 2013-03-21 DIAGNOSIS — L97509 Non-pressure chronic ulcer of other part of unspecified foot with unspecified severity: Secondary | ICD-10-CM | POA: Insufficient documentation

## 2013-03-21 DIAGNOSIS — E1169 Type 2 diabetes mellitus with other specified complication: Secondary | ICD-10-CM | POA: Insufficient documentation

## 2013-04-09 ENCOUNTER — Ambulatory Visit (AMBULATORY_SURGERY_CENTER): Payer: Medicare Other | Admitting: Gastroenterology

## 2013-04-09 ENCOUNTER — Encounter: Payer: Self-pay | Admitting: Gastroenterology

## 2013-04-09 ENCOUNTER — Other Ambulatory Visit: Payer: Self-pay | Admitting: Gastroenterology

## 2013-04-09 ENCOUNTER — Encounter: Payer: Self-pay | Admitting: *Deleted

## 2013-04-09 VITALS — BP 127/69 | HR 165 | Temp 97.9°F | Resp 19 | Ht 68.0 in | Wt 178.0 lb

## 2013-04-09 DIAGNOSIS — K253 Acute gastric ulcer without hemorrhage or perforation: Secondary | ICD-10-CM

## 2013-04-09 DIAGNOSIS — R1013 Epigastric pain: Secondary | ICD-10-CM

## 2013-04-09 LAB — GLUCOSE, CAPILLARY: Glucose-Capillary: 143 mg/dL — ABNORMAL HIGH (ref 70–99)

## 2013-04-09 MED ORDER — SODIUM CHLORIDE 0.9 % IV SOLN: 500.0000 mL | INTRAVENOUS | Status: DC

## 2013-04-09 NOTE — Patient Instructions (Addendum)
YOU HAD AN ENDOSCOPIC PROCEDURE TODAY AT THE [facility]: Refer to the procedure report that was given to you for any specific questions about what was found during the examination.  If the procedure report does not answer your questions, please call your gastroenterologist to clarify.  If you requested that your care partner not be given the details of your procedure findings, then the procedure report has been included in a sealed envelope for you to review at your convenience later.  YOU SHOULD EXPECT: Some feelings of bloating in the abdomen. Passage of more gas than usual.  Walking can help get rid of the air that was put into your GI tract during the procedure and reduce the bloating.  DIET: Your first meal following the procedure should be a light meal and then it is ok to progress to your normal diet.  A half-sandwich or bowl of soup is an example of a good first meal.  Heavy or fried foods are harder to digest and may make you feel nauseous or bloated.  Likewise meals heavy in dairy and vegetables can cause extra gas to form and this can also increase the bloating.  Drink plenty of fluids but you should avoid alcoholic beverages for 24 hours.  ACTIVITY: Your care partner should take you home directly after the procedure.  You should plan to take it easy, moving slowly for the rest of the day.  You can resume normal activity the day after the procedure however you should NOT DRIVE or use heavy machinery for 24 hours (because of the sedation medicines used during the test).    SYMPTOMS TO REPORT IMMEDIATELY: A gastroenterologist can be reached at any hour.  During normal business hours, 8:30 AM to 5:00 PM Monday through Friday, call [phone].  After hours and on weekends, please call the GI answering service at [phone] who will take a message and have the physician on call contact you.   Following upper endoscopy (EGD)  Vomiting of blood or coffee ground material  New  chest pain or pain under the shoulder blades  Painful or persistently difficult swallowing  New shortness of breath  Fever of 100F or higher  Black, tarry-looking stools  FOLLOW UP: If any biopsies were taken you will be contacted by phone or by letter within the next 1-3 weeks.  Call your gastroenterologist if you have not heard about the biopsies in 3 weeks.  Our staff will call the home number listed on your records the next business day following your procedure to check on you and address any questions or concerns that you may have at that time regarding the information given to you following your procedure. This is a courtesy call and so if there is no answer at the home number and we have not heard from you through the emergency physician on call, we will assume that you have returned to your regular daily activities without incident.  SIGNATURES/CONFIDENTIALITY: You and/or your care partner have signed paperwork which will be entered into your electronic medical record.  These signatures attest to the fact that that the information above on your After Visit Summary has been reviewed and is understood.  Full responsibility of the confidentiality of this discharge information lies with you and/or your care-partner.  Continue Nexium long term  Await pathology results  Aspiring 81mg  ok if recommended and taken with Nexium

## 2013-04-09 NOTE — Progress Notes (Signed)
Patient did not experience any of the following events: a burn prior to discharge; a fall within the facility; wrong site/side/patient/procedure/implant event; or a hospital transfer or hospital admission upon discharge from the facility. (G8907) Patient did not have preoperative order for IV antibiotic SSI prophylaxis. (G8918)  

## 2013-04-09 NOTE — Op Note (Signed)
[facility] [address] [place], [address]   ENDOSCOPY PROCEDURE REPORT  PATIENT: Stanley, [name]  MR#: [id number] BIRTHDATE: Aug 09, 1925 , 87  yrs. old GENDER: Male ENDOSCOPIST: Meryl Dare, MD, [facility] PROCEDURE DATE:  04/09/2013 PROCEDURE:  EGD w/ biopsy ASA CLASS:     Class III INDICATIONS:  gastric ulcer, follow up. MEDICATIONS: MAC sedation, administered by CRNA and propofol (Diprivan) 150mg  IV TOPICAL ANESTHETIC: none DESCRIPTION OF PROCEDURE: After the risks benefits and alternatives of the procedure were thoroughly explained, informed consent was obtained.  The LB GIF-H180 [id number] endoscope was introduced through the mouth and advanced to the second portion of the duodenum. Without limitations.  The instrument was slowly withdrawn as the mucosa was fully examined.  ESOPHAGUS: The mucosa of the esophagus appeared normal. STOMACH: The mucosa of the stomach appeared normal. Healed ulcer site at pylorus. Multiple random biopsies were performed uin the antrum and body with history of H. pylori treated in 12/2012. DUODENUM: The duodenal mucosa showed no abnormalities in the bulb and second portion of the duodenum.  Retroflexed views revealed no abnormalities.  The scope was then withdrawn from the patient and the procedure completed.  COMPLICATIONS: There were no complications.  ENDOSCOPIC IMPRESSION: 1.   EGD appeared normal  RECOMMENDATIONS: 1.  Continue PPI daily long term 2.  Await pathology 3.  Avoid NSAIDS long term if possible. ASA 81 mg daily is ok if recommended and if taken along with a PPI    eSigned:  Meryl Dare, MD, [facility] 04/09/2013 9:37 AM

## 2013-04-09 NOTE — Progress Notes (Signed)
No allergies to eggs or soy per pt. 

## 2013-04-09 NOTE — Progress Notes (Signed)
Called to room to assist during endoscopic procedure.  Patient ID and intended procedure confirmed with present staff. Received instructions for my participation in the procedure from the performing physician.  

## 2013-04-10 ENCOUNTER — Telehealth: Payer: Self-pay | Admitting: *Deleted

## 2013-04-10 NOTE — Telephone Encounter (Signed)
  Follow up Call-  Call back number 04/09/2013 12/21/2012  Post procedure Call Back phone  # [phone] or [phone]  Permission to leave phone message Yes Yes     Patient questions:  Do you have a fever, pain , or abdominal swelling? no Pain Score  0 *  Have you tolerated food without any problems? yes  Have you been able to return to your normal activities? yes  Do you have any questions about your discharge instructions: Diet   no Medications  no Follow up visit  no  Do you have questions or concerns about your Care? no  Actions: * If pain score is 4 or above: No action needed, pain <4.

## 2013-04-10 NOTE — Telephone Encounter (Signed)
Entered in error. Maw  

## 2013-04-12 ENCOUNTER — Encounter: Payer: Self-pay | Admitting: Gastroenterology

## 2013-05-02 ENCOUNTER — Encounter (HOSPITAL_BASED_OUTPATIENT_CLINIC_OR_DEPARTMENT_OTHER): Payer: Medicare Other | Attending: General Surgery

## 2013-05-02 DIAGNOSIS — L97509 Non-pressure chronic ulcer of other part of unspecified foot with unspecified severity: Secondary | ICD-10-CM | POA: Insufficient documentation

## 2013-05-02 DIAGNOSIS — L84 Corns and callosities: Secondary | ICD-10-CM | POA: Insufficient documentation

## 2013-05-02 DIAGNOSIS — E1169 Type 2 diabetes mellitus with other specified complication: Secondary | ICD-10-CM | POA: Insufficient documentation

## 2013-05-11 ENCOUNTER — Other Ambulatory Visit: Payer: Self-pay | Admitting: Family Medicine

## 2013-05-18 ENCOUNTER — Encounter: Payer: Self-pay | Admitting: Family Medicine

## 2013-05-18 ENCOUNTER — Ambulatory Visit (INDEPENDENT_AMBULATORY_CARE_PROVIDER_SITE_OTHER): Payer: Medicare Other | Admitting: Family Medicine

## 2013-05-18 VITALS — BP 118/60 | HR 76 | Temp 97.6°F | Wt 188.0 lb

## 2013-05-18 DIAGNOSIS — I2510 Atherosclerotic heart disease of native coronary artery without angina pectoris: Secondary | ICD-10-CM

## 2013-05-18 DIAGNOSIS — E119 Type 2 diabetes mellitus without complications: Secondary | ICD-10-CM

## 2013-05-18 DIAGNOSIS — I6529 Occlusion and stenosis of unspecified carotid artery: Secondary | ICD-10-CM

## 2013-05-18 DIAGNOSIS — Z954 Presence of other heart-valve replacement: Secondary | ICD-10-CM

## 2013-05-18 DIAGNOSIS — I10 Essential (primary) hypertension: Secondary | ICD-10-CM

## 2013-05-18 NOTE — Progress Notes (Signed)
  Subjective:    Patient ID: Stanley [name], male    DOB: 19-Sep-1925, 79 y.o.   MRN: [id number]  HPI Here to follow up on HTN, diabetes, and other issues. He feels well in general. His glucoses are well controlled at home. He is out of his fracture shoes and wearing a regular shoes again. Using a cane to get around. He is working with [employer] to have special molded shoes built to fit the right foot.    Review of Systems  Constitutional: Negative.   Respiratory: Negative.   Cardiovascular: Negative.        Objective:   Physical Exam  Constitutional: He appears well-developed and well-nourished.  Neck: No thyromegaly present.  Cardiovascular: Normal rate, regular rhythm, normal heart sounds and intact distal pulses.   Pulmonary/Chest: Effort normal and breath sounds normal.  Musculoskeletal:  The first and second toes on the right foot are surgically absent and he has an ulcerated area on the lateral 5th metatarsal head  Lymphadenopathy:    He has no cervical adenopathy.          Assessment & Plan:  His HTN is stable. Check an A1c today. Papers will be filled out for him to get his orthotic shoes.

## 2013-05-21 ENCOUNTER — Encounter: Payer: Self-pay | Admitting: Family Medicine

## 2013-05-23 ENCOUNTER — Encounter (HOSPITAL_BASED_OUTPATIENT_CLINIC_OR_DEPARTMENT_OTHER): Payer: Medicare Other | Attending: General Surgery

## 2013-05-23 DIAGNOSIS — L97509 Non-pressure chronic ulcer of other part of unspecified foot with unspecified severity: Secondary | ICD-10-CM | POA: Insufficient documentation

## 2013-05-23 DIAGNOSIS — E1169 Type 2 diabetes mellitus with other specified complication: Secondary | ICD-10-CM | POA: Insufficient documentation

## 2013-05-24 ENCOUNTER — Other Ambulatory Visit: Payer: Self-pay | Admitting: Family Medicine

## 2013-05-31 ENCOUNTER — Other Ambulatory Visit: Payer: Self-pay | Admitting: Family Medicine

## 2013-06-20 ENCOUNTER — Encounter (HOSPITAL_BASED_OUTPATIENT_CLINIC_OR_DEPARTMENT_OTHER): Payer: Medicare Other | Attending: General Surgery

## 2013-06-20 DIAGNOSIS — E1169 Type 2 diabetes mellitus with other specified complication: Secondary | ICD-10-CM | POA: Insufficient documentation

## 2013-06-20 DIAGNOSIS — L97509 Non-pressure chronic ulcer of other part of unspecified foot with unspecified severity: Secondary | ICD-10-CM | POA: Insufficient documentation

## 2013-07-04 ENCOUNTER — Other Ambulatory Visit: Payer: Self-pay | Admitting: Family Medicine

## 2013-07-09 ENCOUNTER — Telehealth: Payer: Self-pay | Admitting: Family Medicine

## 2013-07-09 MED ORDER — DIPHENOXYLATE-ATROPINE 2.5-0.025 MG PO TABS: 1.0000 | ORAL_TABLET | Freq: Four times a day (QID) | ORAL | Status: DC | PRN

## 2013-07-09 NOTE — Telephone Encounter (Signed)
Refill request for Lomotil and per Dr. Clent Ridges okay to refill. I did fax refill to [facility].

## 2013-07-15 ENCOUNTER — Other Ambulatory Visit: Payer: Self-pay | Admitting: Family Medicine

## 2013-07-23 ENCOUNTER — Telehealth: Payer: Self-pay | Admitting: Family Medicine

## 2013-07-23 MED ORDER — GLUCOSE BLOOD VI STRP: ORAL_STRIP | Status: DC

## 2013-07-23 NOTE — Telephone Encounter (Signed)
Refill request for One touch ultra test strips and I did send e-scribe.

## 2013-08-10 ENCOUNTER — Other Ambulatory Visit: Payer: Self-pay | Admitting: Family Medicine

## 2013-08-10 DIAGNOSIS — E119 Type 2 diabetes mellitus without complications: Secondary | ICD-10-CM

## 2013-08-15 ENCOUNTER — Other Ambulatory Visit (INDEPENDENT_AMBULATORY_CARE_PROVIDER_SITE_OTHER): Payer: Medicare Other

## 2013-08-15 DIAGNOSIS — E119 Type 2 diabetes mellitus without complications: Secondary | ICD-10-CM

## 2013-08-15 LAB — BASIC METABOLIC PANEL
BUN: 17 mg/dL (ref 6–23)
CO2: 24 mEq/L (ref 19–32)
Calcium: 9.2 mg/dL (ref 8.4–10.5)
GFR: 84 mL/min (ref >60.00)
Glucose, Bld: 127 mg/dL — ABNORMAL HIGH (ref 70–99)

## 2013-08-17 NOTE — Progress Notes (Signed)
Quick Note:  I spoke with pt ______ 

## 2013-08-19 ENCOUNTER — Other Ambulatory Visit: Payer: Self-pay | Admitting: Family Medicine

## 2013-08-27 ENCOUNTER — Encounter (HOSPITAL_BASED_OUTPATIENT_CLINIC_OR_DEPARTMENT_OTHER): Payer: Medicare Other | Attending: General Surgery

## 2013-08-27 DIAGNOSIS — L97509 Non-pressure chronic ulcer of other part of unspecified foot with unspecified severity: Secondary | ICD-10-CM | POA: Insufficient documentation

## 2013-08-27 DIAGNOSIS — E1169 Type 2 diabetes mellitus with other specified complication: Secondary | ICD-10-CM | POA: Insufficient documentation

## 2013-09-13 ENCOUNTER — Encounter: Payer: Self-pay | Admitting: Family Medicine

## 2013-09-13 ENCOUNTER — Ambulatory Visit (INDEPENDENT_AMBULATORY_CARE_PROVIDER_SITE_OTHER): Payer: Medicare Other | Admitting: Family Medicine

## 2013-09-13 VITALS — BP 150/72 | HR 84 | Temp 97.6°F | Wt 191.0 lb

## 2013-09-13 DIAGNOSIS — Z23 Encounter for immunization: Secondary | ICD-10-CM

## 2013-09-13 DIAGNOSIS — I2510 Atherosclerotic heart disease of native coronary artery without angina pectoris: Secondary | ICD-10-CM

## 2013-09-13 DIAGNOSIS — Z954 Presence of other heart-valve replacement: Secondary | ICD-10-CM

## 2013-09-13 DIAGNOSIS — I10 Essential (primary) hypertension: Secondary | ICD-10-CM

## 2013-09-13 DIAGNOSIS — I440 Atrioventricular block, first degree: Secondary | ICD-10-CM

## 2013-09-13 DIAGNOSIS — R0602 Shortness of breath: Secondary | ICD-10-CM

## 2013-09-13 MED ORDER — HYDROCODONE-ACETAMINOPHEN 10-325 MG PO TABS: 1.0000 | ORAL_TABLET | Freq: Four times a day (QID) | ORAL | Status: DC | PRN

## 2013-09-13 NOTE — Progress Notes (Signed)
  Subjective:    Patient ID: Stanley [name], male    DOB: [birth year], 79 y.o.   MRN: [id number]  HPI Here for one month of a dry cough and some increase SOB on exertion. He does not feel sick, no fever or PND. No swelling in the legs or feet.    Review of Systems  Constitutional: Positive for fatigue.  HENT: Negative.   Respiratory: Positive for cough and shortness of breath. Negative for choking, chest tightness and wheezing.   Cardiovascular: Negative.   Neurological: Negative.        Objective:   Physical Exam  Constitutional: He is oriented to person, place, and time. He appears well-developed and well-nourished. No distress.  Neck: No thyromegaly present.  Cardiovascular: Normal rate, normal heart sounds and intact distal pulses.  Exam reveals no friction rub.   No murmur heard. Irregular rhythm on exam, but the EKG shows NSR with no ectopy   Pulmonary/Chest: Effort normal and breath sounds normal. No respiratory distress. He has no wheezes. He has no rales.  Lymphadenopathy:    He has no cervical adenopathy.  Neurological: He is alert and oriented to person, place, and time.          Assessment & Plan:  The etiology of his cough is not clear. No evidence of CHF. We will send him for a CXR today

## 2013-09-14 ENCOUNTER — Ambulatory Visit (INDEPENDENT_AMBULATORY_CARE_PROVIDER_SITE_OTHER)
Admission: RE | Admit: 2013-09-14 | Discharge: 2013-09-14 | Disposition: A | Discharge status: Home / Self Care | Payer: Medicare Other | Source: Ambulatory Visit | Attending: Family Medicine | Admitting: Family Medicine

## 2013-09-14 DIAGNOSIS — R0602 Shortness of breath: Secondary | ICD-10-CM

## 2013-09-17 NOTE — Progress Notes (Signed)
Quick Note:  I spoke with pt ______ 

## 2013-10-08 ENCOUNTER — Encounter: Payer: Self-pay | Admitting: *Deleted

## 2013-10-10 ENCOUNTER — Ambulatory Visit (INDEPENDENT_AMBULATORY_CARE_PROVIDER_SITE_OTHER): Payer: Medicare Other | Admitting: Cardiology

## 2013-10-10 ENCOUNTER — Encounter: Payer: Self-pay | Admitting: Cardiology

## 2013-10-10 VITALS — BP 110/60 | HR 80 | Ht 68.0 in | Wt 181.0 lb

## 2013-10-10 DIAGNOSIS — Z954 Presence of other heart-valve replacement: Secondary | ICD-10-CM

## 2013-10-10 DIAGNOSIS — I2510 Atherosclerotic heart disease of native coronary artery without angina pectoris: Secondary | ICD-10-CM

## 2013-10-10 DIAGNOSIS — I6529 Occlusion and stenosis of unspecified carotid artery: Secondary | ICD-10-CM

## 2013-10-10 DIAGNOSIS — E785 Hyperlipidemia, unspecified: Secondary | ICD-10-CM

## 2013-10-10 DIAGNOSIS — I359 Nonrheumatic aortic valve disorder, unspecified: Secondary | ICD-10-CM

## 2013-10-10 DIAGNOSIS — I959 Hypotension, unspecified: Secondary | ICD-10-CM

## 2013-10-10 NOTE — Patient Instructions (Addendum)
Your physician has requested that you have an echocardiogram. Echocardiography is a painless test that uses sound waves to create images of your heart. It provides your doctor with information about the size and shape of your heart and how well your heart's chambers and valves are working. This procedure takes approximately one hour. There are no restrictions for this procedure.  Your physician recommends that you return for a FASTING lipid profile. This can be scheduled in the [facility].   Your physician wants you to follow-up in: 6 months with Dr Shirlee Latch. (April 2015).  You will receive a reminder letter in the mail two months in advance. If you don't receive a letter, please call our office to schedule the follow-up appointment.

## 2013-10-10 NOTE — Progress Notes (Signed)
Patient ID: Stanley [name], male   DOB: [birth year], 79 y.o.   MRN: [id number] PCP: Dr. Clent Ridges  79 yo with history of PAD, CABG/AVR, diabetes, and orthostatic hypotension presents for cardiology followup.  He has been seen by Dr. Daleen Squibb in the past and is seen by me for the first time today.  I have reviewed all his old records.  Main problem recently has been a chronic right foot ulcer.  He has severe, unrevascularizable bilateral tibial disease which had limited the ulcer healing.  However, it has now completely healed.  Legs are weak but he denies significant claudication-type symptoms.  He had also been having trouble with orthostatic hypotension but this has resolved off BP meds.  No lightheadedness currently.  He is unstable on his feet due to diabetic neuropathy and walks with a cane.  No falls.  He has not been taking aspirin for about 2 wks.  He developed epigastric discomfort and was concerned for recurrent gastric ulcer.  He increased Nexium to bid and stopped aspirin, and the GI discomfort has resolved.  He is not very active but denies exertional dyspnea or chest pain.  He rides his exercise bike 3-4 days/week.    ECG (9/14): NSR, 1st degree AV block, old ASMI  Labs (8/14): K 4.4, creatinine 0.9  PMH: 1. PAD: peripheral angiography (3/13) showed severe, unreconstructable bilateral tibial artery disease.  He had a chronic ulcer on his right foot that has actually now healed.  He did lose 2 toes on his right foot.  2. Orthostatic hypotension: He is no longer on BP-active medications.  3. Carotid dopplers (3/14) with 0-39% bilateral stenosis.  4. OSA: Uses CPAP.  5. GERD 6. BPH 7. Type II diabetes 8. H/o HTN but not on meds for this now.  ACEI cough.  9. Gastric ulcer: Last EGD in 4/14 was normal.  10. CAD: CABG at time of AVR in 8/11 with LIMA-LAD and SVG-PDA.  11. Aortic stenosis: Bioprosthetic AVR in 8/11.  Pre-op echo in 3/11 showed EF 60-65% with severe AS.   SH: Married, retired  Development worker, community, occasional ETOH, no smoking.   FH: Type II diabetes  ROS: All systems reviewed and negative except as per HPI.   Current Outpatient Prescriptions  Medication Sig Dispense Refill  . Calcium Carbonate-Vitamin D (CALCIUM-VITAMIN D) 500-200 MG-UNIT per tablet Take 1 tablet by mouth 2 (two) times daily.       . clotrimazole-betamethasone (LOTRISONE) cream       . diphenoxylate-atropine (LOMOTIL) 2.5-0.025 MG per tablet Take 1 tablet by mouth 4 (four) times daily as needed for diarrhea or loose stools.  60 tablet  11  . dutasteride (AVODART) 0.5 MG capsule Take 0.5 mg by mouth daily.        Marland Kitchen esomeprazole (NEXIUM) 40 MG capsule Take 40 mg by mouth 2 (two) times daily.       Marland Kitchen FERREX 150 150 MG capsule take 1 capsule by mouth once daily  30 capsule  11  . GLIPIZIDE XL 10 MG 24 hr tablet TAKE 2 TABLETS DAILY.  180 tablet  3  . glucose blood (ONE TOUCH TEST STRIPS) test strip Dispense one touch ultra, test once per day and diagnosis code is 250.00  100 each  3  . HYDROcodone-acetaminophen (NORCO) 10-325 MG per tablet Take 1 tablet by mouth every 6 (six) hours as needed for pain.  120 tablet  0  . Multiple Vitamin (MULITIVITAMIN WITH MINERALS) TABS Take 1 tablet by mouth daily.      Marland Kitchen  pioglitazone (ACTOS) 30 MG tablet take 1 tablet by mouth once daily  90 tablet  3  . predniSONE (DELTASONE) 10 MG tablet Take 1 tablet (10 mg total) by mouth daily.  90 tablet  3  . prochlorperazine (COMPAZINE) 10 MG tablet take 1 tablet by mouth four times a day for nausea  100 tablet  3  . rosuvastatin (CRESTOR) 10 MG tablet Take 1 tablet (10 mg total) by mouth daily.  90 tablet  3  . sitaGLIPtan-metformin (JANUMET) 50-1000 MG per tablet Take 1 tablet by mouth 2 (two) times daily with a meal.  180 tablet  3  . solifenacin (VESICARE) 10 MG tablet Take 1 tablet (10 mg total) by mouth daily.  90 tablet  3  . tamsulosin (FLOMAX) 0.4 MG CAPS capsule take 1 capsule by mouth once daily  90 capsule  3  .  [DISCONTINUED] solifenacin (VESICARE) 10 MG tablet Take 10 mg by mouth daily.       No current facility-administered medications for this visit.    BP 110/60  Pulse 80  Ht 5\' 8"  (1.727 m)  Wt 82.101 kg (181 lb)  BMI 27.53 kg/m2 General: NAD Neck: No JVD, no thyromegaly or thyroid nodule.  Lungs: Clear to auscultation bilaterally with normal respiratory effort. CV: Nondisplaced PMI.  Heart regular S1/S2, no S3/S4, 1/6 SEM RUSB.  No peripheral edema.  No carotid bruit.  I am unable to palpate pedal pulses.   Abdomen: Soft, nontender, no hepatosplenomegaly, no distention.  Skin: Intact without lesions or rashes.  Neurologic: Alert and oriented x 3.  Psych: Normal affect. Extremities: No clubbing or cyanosis.   Assessment/Plan: 1. CAD: S/p CABG with AVR in 8/11.  No ischemic symptoms.   - Continue Crestor.  - I would like him to restart ASA.  He can take 81 mg every other day for a couple of weeks, and if he does not redevelop epigastric pain, he should increase it back to daily.  2. Orthostatic hypotension: Possibly due to diabetic autonomic neuropathy.  He is off all BP meds now and no longer symptomatically orthostatic.  3. Hyperlipidemia: I will arrange to check fasting lipids.   4. PAD: Right foot ulcer has healed.  The tibial system did not appear to be revascularizable on prior peripheral angiography. Legs are weak but he denies claudication.   5. Bioprosthetic aortic valve: He never had a baseline echo post-surgery.  I will arrange for echo.  6. Carotid stenosis: Mild on last dopplers.  Can consider followup dopplers in 2 years.   Marca Ancona 10/11/2013

## 2013-10-26 ENCOUNTER — Ambulatory Visit (HOSPITAL_COMMUNITY): Payer: Medicare Other | Attending: Cardiology | Admitting: Radiology

## 2013-10-26 DIAGNOSIS — I959 Hypotension, unspecified: Secondary | ICD-10-CM | POA: Insufficient documentation

## 2013-10-26 DIAGNOSIS — I679 Cerebrovascular disease, unspecified: Secondary | ICD-10-CM | POA: Insufficient documentation

## 2013-10-26 DIAGNOSIS — Z954 Presence of other heart-valve replacement: Secondary | ICD-10-CM

## 2013-10-26 DIAGNOSIS — I059 Rheumatic mitral valve disease, unspecified: Secondary | ICD-10-CM | POA: Insufficient documentation

## 2013-10-26 DIAGNOSIS — I2510 Atherosclerotic heart disease of native coronary artery without angina pectoris: Secondary | ICD-10-CM

## 2013-10-26 DIAGNOSIS — E119 Type 2 diabetes mellitus without complications: Secondary | ICD-10-CM | POA: Insufficient documentation

## 2013-10-26 DIAGNOSIS — I359 Nonrheumatic aortic valve disorder, unspecified: Secondary | ICD-10-CM

## 2013-10-26 DIAGNOSIS — I10 Essential (primary) hypertension: Secondary | ICD-10-CM | POA: Insufficient documentation

## 2013-10-26 DIAGNOSIS — Z952 Presence of prosthetic heart valve: Secondary | ICD-10-CM | POA: Insufficient documentation

## 2013-10-26 NOTE — Progress Notes (Signed)
Echocardiogram performed.  

## 2013-10-29 ENCOUNTER — Telehealth: Payer: Self-pay

## 2013-10-29 MED ORDER — HYDROCODONE-ACETAMINOPHEN 10-325 MG PO TABS: 1.0000 | ORAL_TABLET | Freq: Four times a day (QID) | ORAL | Status: DC | PRN

## 2013-10-29 NOTE — Telephone Encounter (Signed)
Script is ready and pt is here to pick up. 

## 2013-10-29 NOTE — Telephone Encounter (Signed)
done

## 2013-10-29 NOTE — Telephone Encounter (Signed)
Pt is requesting a new rx for hydrocodone rx.  Pls advise.

## 2013-10-30 ENCOUNTER — Telehealth: Payer: Self-pay | Admitting: Family Medicine

## 2013-10-30 DIAGNOSIS — R2681 Unsteadiness on feet: Secondary | ICD-10-CM

## 2013-10-30 NOTE — Telephone Encounter (Signed)
Referral done and spoke with pt.

## 2013-10-30 NOTE — Telephone Encounter (Signed)
He has felt weak and unsteady on his feet lately and would to try PT again

## 2013-10-31 ENCOUNTER — Telehealth: Payer: Self-pay

## 2013-10-31 ENCOUNTER — Other Ambulatory Visit: Payer: Self-pay | Admitting: *Deleted

## 2013-10-31 DIAGNOSIS — Z954 Presence of other heart-valve replacement: Secondary | ICD-10-CM

## 2013-10-31 DIAGNOSIS — I2510 Atherosclerotic heart disease of native coronary artery without angina pectoris: Secondary | ICD-10-CM

## 2013-10-31 MED ORDER — LOSARTAN POTASSIUM 25 MG PO TABS: ORAL_TABLET | ORAL | Status: DC

## 2013-10-31 NOTE — Telephone Encounter (Signed)
Ok per Dr. Clent Ridges for pt to have bmet and lipid bloodwork done this month for Dr. Shirlee Latch.

## 2013-11-06 ENCOUNTER — Ambulatory Visit: Payer: Medicare Other | Attending: Family Medicine | Admitting: Physical Therapy

## 2013-11-06 DIAGNOSIS — R5381 Other malaise: Secondary | ICD-10-CM | POA: Insufficient documentation

## 2013-11-06 DIAGNOSIS — IMO0001 Reserved for inherently not codable concepts without codable children: Secondary | ICD-10-CM | POA: Insufficient documentation

## 2013-11-06 DIAGNOSIS — R269 Unspecified abnormalities of gait and mobility: Secondary | ICD-10-CM | POA: Insufficient documentation

## 2013-11-07 ENCOUNTER — Ambulatory Visit: Payer: Medicare Other | Admitting: Physical Therapy

## 2013-11-07 ENCOUNTER — Encounter: Payer: Self-pay | Admitting: Physical Therapy

## 2013-11-08 ENCOUNTER — Ambulatory Visit: Payer: Medicare Other | Admitting: Physical Therapy

## 2013-11-12 ENCOUNTER — Ambulatory Visit: Payer: Medicare Other | Admitting: Physical Therapy

## 2013-11-13 ENCOUNTER — Ambulatory Visit: Payer: Medicare Other

## 2013-11-16 ENCOUNTER — Other Ambulatory Visit (INDEPENDENT_AMBULATORY_CARE_PROVIDER_SITE_OTHER): Payer: Medicare Other

## 2013-11-16 DIAGNOSIS — Z954 Presence of other heart-valve replacement: Secondary | ICD-10-CM

## 2013-11-16 DIAGNOSIS — I359 Nonrheumatic aortic valve disorder, unspecified: Secondary | ICD-10-CM

## 2013-11-16 DIAGNOSIS — E119 Type 2 diabetes mellitus without complications: Secondary | ICD-10-CM

## 2013-11-16 DIAGNOSIS — I2510 Atherosclerotic heart disease of native coronary artery without angina pectoris: Secondary | ICD-10-CM

## 2013-11-16 DIAGNOSIS — E785 Hyperlipidemia, unspecified: Secondary | ICD-10-CM

## 2013-11-16 LAB — BASIC METABOLIC PANEL WITH GFR
BUN: 22 mg/dL (ref 6–23)
CO2: 27 meq/L (ref 19–32)
Calcium: 9 mg/dL (ref 8.4–10.5)
Chloride: 103 meq/L (ref 96–112)
Creatinine, Ser: 0.6 mg/dL (ref 0.4–1.5)
GFR: 135.76 mL/min (ref >60.00)
Glucose, Bld: 125 mg/dL — ABNORMAL HIGH (ref 70–99)
Potassium: 4.2 meq/L (ref 3.5–5.1)
Sodium: 134 meq/L — ABNORMAL LOW (ref 135–145)

## 2013-11-16 LAB — LDL CHOLESTEROL, DIRECT: Direct LDL: 102.9 mg/dL

## 2013-11-16 LAB — LIPID PANEL
Cholesterol: 181 mg/dL (ref 0–200)
Total CHOL/HDL Ratio: 4
Triglycerides: 234 mg/dL — ABNORMAL HIGH (ref 0.0–149.0)
VLDL: 46.8 mg/dL — ABNORMAL HIGH (ref 0.0–40.0)

## 2013-11-16 LAB — POCT HEMOGLOBIN: Hemoglobin: 11.6 g/dL — AB (ref 14.1–18.1)

## 2013-11-19 ENCOUNTER — Encounter: Payer: Self-pay | Admitting: Physical Therapy

## 2013-11-20 ENCOUNTER — Ambulatory Visit: Payer: Medicare Other | Attending: Family Medicine | Admitting: Physical Therapy

## 2013-11-20 DIAGNOSIS — R269 Unspecified abnormalities of gait and mobility: Secondary | ICD-10-CM | POA: Insufficient documentation

## 2013-11-20 DIAGNOSIS — R5381 Other malaise: Secondary | ICD-10-CM | POA: Insufficient documentation

## 2013-11-20 DIAGNOSIS — IMO0001 Reserved for inherently not codable concepts without codable children: Secondary | ICD-10-CM | POA: Insufficient documentation

## 2013-11-21 ENCOUNTER — Ambulatory Visit: Payer: Medicare Other | Admitting: Physical Therapy

## 2013-11-21 ENCOUNTER — Other Ambulatory Visit: Payer: Self-pay | Admitting: *Deleted

## 2013-11-21 DIAGNOSIS — I2510 Atherosclerotic heart disease of native coronary artery without angina pectoris: Secondary | ICD-10-CM

## 2013-11-21 DIAGNOSIS — I359 Nonrheumatic aortic valve disorder, unspecified: Secondary | ICD-10-CM

## 2013-11-21 DIAGNOSIS — E785 Hyperlipidemia, unspecified: Secondary | ICD-10-CM

## 2013-11-21 MED ORDER — LOSARTAN POTASSIUM 25 MG PO TABS: 25.0000 mg | ORAL_TABLET | Freq: Every day | ORAL | Status: DC

## 2013-11-21 NOTE — Progress Notes (Signed)
Pt.notified

## 2013-11-23 ENCOUNTER — Ambulatory Visit: Payer: Medicare Other | Admitting: Physical Therapy

## 2013-11-26 ENCOUNTER — Ambulatory Visit: Payer: Medicare Other | Admitting: Physical Therapy

## 2013-11-28 ENCOUNTER — Ambulatory Visit: Payer: Medicare Other | Admitting: Physical Therapy

## 2013-11-30 ENCOUNTER — Ambulatory Visit: Payer: Medicare Other | Admitting: Physical Therapy

## 2013-12-03 ENCOUNTER — Ambulatory Visit: Payer: Medicare Other | Admitting: Physical Therapy

## 2013-12-05 ENCOUNTER — Ambulatory Visit: Payer: Medicare Other | Admitting: Physical Therapy

## 2013-12-07 ENCOUNTER — Ambulatory Visit: Payer: Medicare Other | Admitting: Physical Therapy

## 2013-12-10 ENCOUNTER — Ambulatory Visit: Payer: Medicare Other | Admitting: Physical Therapy

## 2013-12-18 ENCOUNTER — Ambulatory Visit: Payer: Medicare Other | Admitting: Physical Therapy

## 2013-12-21 ENCOUNTER — Ambulatory Visit: Payer: Medicare Other | Attending: Family Medicine | Admitting: Physical Therapy

## 2013-12-21 DIAGNOSIS — IMO0001 Reserved for inherently not codable concepts without codable children: Secondary | ICD-10-CM | POA: Insufficient documentation

## 2013-12-21 DIAGNOSIS — R269 Unspecified abnormalities of gait and mobility: Secondary | ICD-10-CM | POA: Insufficient documentation

## 2013-12-21 DIAGNOSIS — R5381 Other malaise: Secondary | ICD-10-CM | POA: Insufficient documentation

## 2013-12-24 ENCOUNTER — Ambulatory Visit: Payer: Medicare Other | Admitting: Physical Therapy

## 2013-12-26 ENCOUNTER — Ambulatory Visit: Payer: Medicare Other | Admitting: Physical Therapy

## 2013-12-28 ENCOUNTER — Ambulatory Visit: Payer: Medicare Other | Admitting: Physical Therapy

## 2014-01-01 ENCOUNTER — Ambulatory Visit: Payer: Medicare Other | Admitting: Physical Therapy

## 2014-01-02 ENCOUNTER — Ambulatory Visit: Payer: Medicare Other | Admitting: Physical Therapy

## 2014-01-03 ENCOUNTER — Ambulatory Visit: Payer: Medicare Other

## 2014-01-07 ENCOUNTER — Ambulatory Visit: Payer: Medicare Other | Admitting: Physical Therapy

## 2014-01-09 ENCOUNTER — Ambulatory Visit: Payer: Medicare Other | Admitting: Physical Therapy

## 2014-01-11 ENCOUNTER — Encounter: Payer: Self-pay | Admitting: Physical Therapy

## 2014-01-14 ENCOUNTER — Ambulatory Visit: Payer: Medicare Other | Admitting: Physical Therapy

## 2014-01-16 ENCOUNTER — Ambulatory Visit: Payer: Medicare Other | Admitting: Physical Therapy

## 2014-01-18 ENCOUNTER — Encounter: Payer: Self-pay | Admitting: Physical Therapy

## 2014-01-21 ENCOUNTER — Ambulatory Visit: Payer: Medicare Other | Attending: Family Medicine | Admitting: Physical Therapy

## 2014-01-21 DIAGNOSIS — IMO0001 Reserved for inherently not codable concepts without codable children: Secondary | ICD-10-CM | POA: Insufficient documentation

## 2014-01-21 DIAGNOSIS — R5381 Other malaise: Secondary | ICD-10-CM | POA: Insufficient documentation

## 2014-01-21 DIAGNOSIS — R269 Unspecified abnormalities of gait and mobility: Secondary | ICD-10-CM | POA: Insufficient documentation

## 2014-01-22 ENCOUNTER — Other Ambulatory Visit (INDEPENDENT_AMBULATORY_CARE_PROVIDER_SITE_OTHER): Payer: Medicare Other

## 2014-01-22 DIAGNOSIS — I2510 Atherosclerotic heart disease of native coronary artery without angina pectoris: Secondary | ICD-10-CM

## 2014-01-22 DIAGNOSIS — E785 Hyperlipidemia, unspecified: Secondary | ICD-10-CM

## 2014-01-22 DIAGNOSIS — I359 Nonrheumatic aortic valve disorder, unspecified: Secondary | ICD-10-CM

## 2014-01-22 LAB — LIPID PANEL
Cholesterol: 150 mg/dL (ref 0–200)
HDL: 45.6 mg/dL (ref >39.00)
TRIGLYCERIDES: 208 mg/dL — AB (ref 0.0–149.0)
Total CHOL/HDL Ratio: 3
VLDL: 41.6 mg/dL — ABNORMAL HIGH (ref 0.0–40.0)

## 2014-01-22 LAB — HEPATIC FUNCTION PANEL
ALT: 24 U/L (ref 0–53)
AST: 20 U/L (ref 0–37)
Albumin: 3.7 g/dL (ref 3.5–5.2)
Alkaline Phosphatase: 69 U/L (ref 39–117)
Bilirubin, Direct: 0.1 mg/dL (ref 0.0–0.3)
TOTAL PROTEIN: 6.4 g/dL (ref 6.0–8.3)
Total Bilirubin: 0.6 mg/dL (ref 0.3–1.2)

## 2014-01-22 LAB — LDL CHOLESTEROL, DIRECT: Direct LDL: 80.5 mg/dL

## 2014-01-23 ENCOUNTER — Ambulatory Visit: Payer: Medicare Other | Admitting: Physical Therapy

## 2014-01-25 ENCOUNTER — Encounter: Payer: Self-pay | Admitting: Physical Therapy

## 2014-01-28 ENCOUNTER — Ambulatory Visit: Payer: Medicare Other | Admitting: Physical Therapy

## 2014-01-29 ENCOUNTER — Telehealth: Payer: Self-pay | Admitting: Family Medicine

## 2014-01-29 NOTE — Telephone Encounter (Signed)
Pt needs a refill on Norco

## 2014-01-30 ENCOUNTER — Ambulatory Visit: Payer: Medicare Other | Admitting: Physical Therapy

## 2014-01-30 MED ORDER — HYDROCODONE-ACETAMINOPHEN 10-325 MG PO TABS: 1.0000 | ORAL_TABLET | Freq: Four times a day (QID) | ORAL | Status: DC | PRN

## 2014-01-30 NOTE — Telephone Encounter (Signed)
Pt is coming by today to pick up script.

## 2014-01-30 NOTE — Telephone Encounter (Signed)
done

## 2014-01-31 ENCOUNTER — Ambulatory Visit: Payer: Medicare Other | Admitting: Physical Therapy

## 2014-02-01 ENCOUNTER — Encounter: Payer: Self-pay | Admitting: Physical Therapy

## 2014-02-04 ENCOUNTER — Ambulatory Visit: Payer: Medicare Other | Admitting: Physical Therapy

## 2014-02-06 ENCOUNTER — Encounter: Payer: Self-pay | Admitting: Physical Therapy

## 2014-02-08 ENCOUNTER — Ambulatory Visit: Payer: Medicare Other | Admitting: Physical Therapy

## 2014-02-11 ENCOUNTER — Ambulatory Visit: Payer: Medicare Other | Admitting: Physical Therapy

## 2014-02-12 ENCOUNTER — Ambulatory Visit: Payer: Medicare Other | Admitting: Physical Therapy

## 2014-02-13 ENCOUNTER — Encounter: Payer: Self-pay | Admitting: Physical Therapy

## 2014-02-27 ENCOUNTER — Other Ambulatory Visit: Payer: Self-pay

## 2014-02-27 MED ORDER — ESOMEPRAZOLE MAGNESIUM 40 MG PO CPDR: 40.0000 mg | DELAYED_RELEASE_CAPSULE | Freq: Two times a day (BID) | ORAL | Status: DC

## 2014-03-29 ENCOUNTER — Other Ambulatory Visit: Payer: Self-pay | Admitting: Family Medicine

## 2014-04-03 ENCOUNTER — Other Ambulatory Visit (INDEPENDENT_AMBULATORY_CARE_PROVIDER_SITE_OTHER): Payer: Medicare Other

## 2014-04-03 ENCOUNTER — Telehealth: Payer: Self-pay | Admitting: Family Medicine

## 2014-04-03 DIAGNOSIS — E119 Type 2 diabetes mellitus without complications: Secondary | ICD-10-CM

## 2014-04-03 DIAGNOSIS — D649 Anemia, unspecified: Secondary | ICD-10-CM

## 2014-04-03 LAB — HEMOGLOBIN A1C: Hgb A1c MFr Bld: 6.3 % (ref 4.6–6.5)

## 2014-04-03 NOTE — Telephone Encounter (Signed)
Here for a lab

## 2014-04-08 ENCOUNTER — Other Ambulatory Visit: Payer: Self-pay | Admitting: Dermatology

## 2014-04-09 ENCOUNTER — Ambulatory Visit (INDEPENDENT_AMBULATORY_CARE_PROVIDER_SITE_OTHER): Payer: Medicare Other | Admitting: *Deleted

## 2014-04-09 DIAGNOSIS — Z23 Encounter for immunization: Secondary | ICD-10-CM

## 2014-04-16 ENCOUNTER — Other Ambulatory Visit: Payer: Self-pay | Admitting: Family Medicine

## 2014-04-29 ENCOUNTER — Telehealth: Payer: Self-pay | Admitting: Family Medicine

## 2014-04-29 MED ORDER — HYDROCODONE-ACETAMINOPHEN 10-325 MG PO TABS: 1.0000 | ORAL_TABLET | Freq: Four times a day (QID) | ORAL | Status: DC | PRN

## 2014-04-29 NOTE — Telephone Encounter (Signed)
Script is ready for pick up and I spoke with pt.  

## 2014-04-29 NOTE — Telephone Encounter (Signed)
done

## 2014-04-29 NOTE — Telephone Encounter (Signed)
Pt requesting a refill on Norco, please call when ready for pick up. 

## 2014-05-10 ENCOUNTER — Ambulatory Visit (INDEPENDENT_AMBULATORY_CARE_PROVIDER_SITE_OTHER): Payer: Medicare Other | Admitting: Cardiology

## 2014-05-10 ENCOUNTER — Encounter (INDEPENDENT_AMBULATORY_CARE_PROVIDER_SITE_OTHER): Payer: Self-pay

## 2014-05-10 VITALS — BP 100/56 | HR 69 | Resp 20 | Ht 69.0 in | Wt 187.2 lb

## 2014-05-10 DIAGNOSIS — I5022 Chronic systolic (congestive) heart failure: Secondary | ICD-10-CM

## 2014-05-10 DIAGNOSIS — Z954 Presence of other heart-valve replacement: Secondary | ICD-10-CM

## 2014-05-10 DIAGNOSIS — I739 Peripheral vascular disease, unspecified: Secondary | ICD-10-CM

## 2014-05-10 DIAGNOSIS — I509 Heart failure, unspecified: Secondary | ICD-10-CM

## 2014-05-10 DIAGNOSIS — I6529 Occlusion and stenosis of unspecified carotid artery: Secondary | ICD-10-CM

## 2014-05-10 DIAGNOSIS — R42 Dizziness and giddiness: Secondary | ICD-10-CM

## 2014-05-10 DIAGNOSIS — I2510 Atherosclerotic heart disease of native coronary artery without angina pectoris: Secondary | ICD-10-CM

## 2014-05-10 DIAGNOSIS — E785 Hyperlipidemia, unspecified: Secondary | ICD-10-CM

## 2014-05-10 DIAGNOSIS — L98499 Non-pressure chronic ulcer of skin of other sites with unspecified severity: Secondary | ICD-10-CM

## 2014-05-10 NOTE — Patient Instructions (Signed)
Your physician wants you to follow-up in: 6 months with Dr McLean. (November 2015).  You will receive a reminder letter in the mail two months in advance. If you don't receive a letter, please call our office to schedule the follow-up appointment.    

## 2014-05-11 ENCOUNTER — Other Ambulatory Visit: Payer: Self-pay | Admitting: Family Medicine

## 2014-05-12 ENCOUNTER — Encounter: Payer: Self-pay | Admitting: Cardiology

## 2014-05-12 DIAGNOSIS — I5022 Chronic systolic (congestive) heart failure: Secondary | ICD-10-CM | POA: Insufficient documentation

## 2014-05-12 NOTE — Progress Notes (Signed)
Patient ID: Stanley [name], male   DOB: Oct 04, 1925, 80 y.o.   MRN: [id number] PCP: Dr. Sarajane Jews  80 yo with history of PAD, CABG/AVR, diabetes, ischemic cardiomyopathy, and orthostatic hypotension presents for cardiology followup.   His chronic right foot ulcer has healed.  He has severe, unrevascularizable bilateral tibial disease which had limited the ulcer healing.  Legs are weak but he denies significant claudication-type symptoms.  No lightheadedness currently.  He has tolerated the initiation of losartan.  BP is soft, however, today at 100/56.  He is unstable on his feet due to diabetic neuropathy and walks with a cane.  No falls.  He denies exertional dyspnea or chest pain.  He walks his dog up to 1/2 mile at a time.  He rides his exercise bike 3-4 days/week.  Most recent echo was in 11/14 and showed EF 35-40% with normal-appearing bioprosthetic aortic valve and mildly dilated RV with mild to moderately decreased systolic function.  Patient had myalgias with Crestor 20 mg daily but is tolerating it at 10 mg daily.   Labs (8/14): K 4.4, creatinine 0.9 Labs (11/14): K 4.2, creatinine 0.6 Labs (2/15): LDL 80, HDL 46  ECG: NSR, 1st degree AV block, right axis deviation.   PMH: 1. PAD: peripheral angiography (3/13) showed severe, unreconstructable bilateral tibial artery disease.  He had a chronic ulcer on his right foot that has actually now healed.  He did lose 2 toes on his right foot.  2. Orthostatic hypotension: He is no longer on BP-active medications.  3. Carotid dopplers (3/14) with 0-39% bilateral stenosis.  4. OSA: Uses CPAP.  5. GERD 6. BPH 7. Type II diabetes 8. H/o HTN but not on meds for this now.  ACEI cough.  9. Gastric ulcer: Last EGD in 4/14 was normal.  10. CAD: CABG at time of AVR in 8/11 with LIMA-LAD and SVG-PDA.  11. Aortic stenosis: Bioprosthetic AVR in 8/11.  Pre-op echo in 3/11 showed EF 60-65% with severe AS.  Bioprosthetic aortic valve looked ok on 11/14 echo.  12.  Hyperlipidemia: Myalgias with Crestor 20 mg daily but can tolerate 10 mg daily.  13. Ischemic cardiomyopathy: Echo (11/14) with EF 35-40%, mild LVH, bioprosthetic aortic valve with no significant AS or AI, mild MR, mildly dilated RV with mild to moderately decreased RV systolic function.   SH: Married, retired Engineer, drilling, occasional ETOH, no smoking.   FH: Type II diabetes  ROS: All systems reviewed and negative except as per HPI.   Current Outpatient Prescriptions  Medication Sig Dispense Refill  . Calcium Carbonate-Vitamin D (CALCIUM-VITAMIN D) 500-200 MG-UNIT per tablet Take 1 tablet by mouth 2 (two) times daily.       . clotrimazole-betamethasone (LOTRISONE) cream       . diphenoxylate-atropine (LOMOTIL) 2.5-0.025 MG per tablet TAKE 1 TABLET 4 TIMES DAILY AS NEEDED FOR DIARRHEA OR LOOSE STOOLS.  60 tablet  3  . dutasteride (AVODART) 0.5 MG capsule Take 0.5 mg by mouth daily.        Marland Kitchen esomeprazole (NEXIUM) 40 MG capsule Take 1 capsule (40 mg total) by mouth 2 (two) times daily.  60 capsule  1  . GLIPIZIDE XL 10 MG 24 hr tablet TAKE 2 TABLETS DAILY.  180 tablet  3  . glucose blood (ONE TOUCH TEST STRIPS) test strip Dispense one touch ultra, test once per day and diagnosis code is 250.00  100 each  3  . HYDROcodone-acetaminophen (NORCO) 10-325 MG per tablet Take 1 tablet by mouth every  6 (six) hours as needed for severe pain.  120 tablet  0  . JANUMET 50-1000 MG per tablet take 1 tablet by mouth twice a day WITH A MEAL.  180 tablet  3  . losartan (COZAAR) 25 MG tablet Take 1 tablet (25 mg total) by mouth daily.  90 tablet  3  . Multiple Vitamin (MULITIVITAMIN WITH MINERALS) TABS Take 1 tablet by mouth daily.      . pioglitazone (ACTOS) 30 MG tablet take 1 tablet by mouth once daily  90 tablet  3  . prochlorperazine (COMPAZINE) 10 MG tablet take 1 tablet by mouth four times a day for nausea  100 tablet  3  . rosuvastatin (CRESTOR) 20 MG tablet Take 1 tablet (20 mg total) by mouth daily.       . tamsulosin (FLOMAX) 0.4 MG CAPS capsule take 1 capsule by mouth once daily  90 capsule  3  . FERREX 150 150 MG capsule take 1 capsule by mouth once daily  30 capsule  11  . predniSONE (DELTASONE) 10 MG tablet Take 1 tablet (10 mg total) by mouth daily.  90 tablet  3  . solifenacin (VESICARE) 10 MG tablet Take 1 tablet (10 mg total) by mouth daily.  90 tablet  3  . [DISCONTINUED] solifenacin (VESICARE) 10 MG tablet Take 10 mg by mouth daily.       No current facility-administered medications for this visit.    BP 100/56  Pulse 69  Resp 20  Ht 5\' 9"  (1.753 m)  Wt 187 lb 3.2 oz (84.913 kg)  BMI 27.63 kg/m2 General: NAD Neck: No JVD, no thyromegaly or thyroid nodule.  Lungs: Clear to auscultation bilaterally with normal respiratory effort. CV: Nondisplaced PMI.  Heart regular S1/S2, no S3/S4, 2/6 early SEM RUSB.  No peripheral edema.  No carotid bruit.  I am unable to palpate pedal pulses.   Abdomen: Soft, nontender, no hepatosplenomegaly, no distention.  Skin: Intact without lesions or rashes.  Neurologic: Alert and oriented x 3.  Psych: Normal affect. Extremities: No clubbing or cyanosis.   Assessment/Plan: 1. CAD: S/p CABG with AVR in 8/11.  No ischemic symptoms.   - Continue Crestor and ASA 81 mg daily.   2. Orthostatic hypotension: Possibly due to diabetic autonomic neuropathy.  He is not significantly symptomatic currently. 3. Hyperlipidemia: LDL is acceptable on Crestor 10 mg daily.  He was unable to tolerate 20 mg daily due to myalgias.    4. PAD: Right foot ulcer has healed.  The tibial system did not appear to be revascularizable on prior peripheral angiography. Legs are weak but he denies claudication.   5. Bioprosthetic aortic valve: 11/14 echo showed stable bioprosthetic valve.  6. Carotid stenosis: Mild on last dopplers.  Can consider followup dopplers in 2 years.  7. Chronic systolic CHF: EF 16-60% on 11/14 echo.  Probably ischemic cardiomyopathy.  NYHA class II  symptoms.  He is not volume overloaded on exam.  I started him on losartan, which I will continue.  I do not think that he has BP room at this point to add on beta blocker.   Followup in 6 months.   Larey Dresser 05/12/2014

## 2014-05-14 ENCOUNTER — Telehealth: Payer: Self-pay

## 2014-05-14 ENCOUNTER — Ambulatory Visit (INDEPENDENT_AMBULATORY_CARE_PROVIDER_SITE_OTHER)
Admission: RE | Admit: 2014-05-14 | Discharge: 2014-05-14 | Disposition: A | Discharge status: Home / Self Care | Payer: Medicare Other | Source: Ambulatory Visit | Attending: Family Medicine | Admitting: Family Medicine

## 2014-05-14 DIAGNOSIS — T189XXA Foreign body of alimentary tract, part unspecified, initial encounter: Secondary | ICD-10-CM

## 2014-05-14 NOTE — Addendum Note (Signed)
Addended by: Katrine Coho on: 05/14/2014 05:29 PM   Modules accepted: Orders

## 2014-05-14 NOTE — Telephone Encounter (Signed)
Pt called requesting to have a chest x-ray and flat plate due to possibly swallowing bridge.  Ok per Dr. Sarajane Jews to order.  Pt aware x-rays ordered.

## 2014-05-27 ENCOUNTER — Telehealth: Payer: Self-pay | Admitting: *Deleted

## 2014-05-27 DIAGNOSIS — T189XXA Foreign body of alimentary tract, part unspecified, initial encounter: Secondary | ICD-10-CM

## 2014-05-27 NOTE — Telephone Encounter (Signed)
Patient is requesting a x-ray of the abdomin.

## 2014-05-28 ENCOUNTER — Ambulatory Visit (INDEPENDENT_AMBULATORY_CARE_PROVIDER_SITE_OTHER)
Admission: RE | Admit: 2014-05-28 | Discharge: 2014-05-28 | Disposition: A | Discharge status: Home / Self Care | Payer: Medicare Other | Source: Ambulatory Visit | Attending: Family Medicine | Admitting: Family Medicine

## 2014-05-28 DIAGNOSIS — T189XXA Foreign body of alimentary tract, part unspecified, initial encounter: Secondary | ICD-10-CM

## 2014-05-28 NOTE — Telephone Encounter (Signed)
The orders were placed, so he can get the films at any time

## 2014-05-28 NOTE — Telephone Encounter (Signed)
I left a voice message with below information. 

## 2014-06-02 ENCOUNTER — Other Ambulatory Visit: Payer: Self-pay | Admitting: Family Medicine

## 2014-07-29 ENCOUNTER — Telehealth: Payer: Self-pay | Admitting: Family Medicine

## 2014-07-29 MED ORDER — HYDROCODONE-ACETAMINOPHEN 10-325 MG PO TABS: 1.0000 | ORAL_TABLET | Freq: Four times a day (QID) | ORAL | Status: DC | PRN

## 2014-07-29 NOTE — Telephone Encounter (Signed)
Script is ready for pick up and I spoke with pt.  

## 2014-07-29 NOTE — Telephone Encounter (Signed)
Refill request for Norco. 

## 2014-07-29 NOTE — Telephone Encounter (Signed)
done

## 2014-07-30 ENCOUNTER — Telehealth: Payer: Self-pay | Admitting: Family Medicine

## 2014-07-30 MED ORDER — ALPRAZOLAM 0.25 MG PO TABS: 0.2500 mg | ORAL_TABLET | Freq: Four times a day (QID) | ORAL | Status: DC | PRN

## 2014-07-30 NOTE — Telephone Encounter (Signed)
I called in script and spoke with pt. 

## 2014-07-30 NOTE — Telephone Encounter (Signed)
He needs to get back on some Xanax for anxiety. Call in Xanax 0.25 mg to take every 6 hours prn anxiety, #120 with 5 rf

## 2014-08-02 ENCOUNTER — Ambulatory Visit (INDEPENDENT_AMBULATORY_CARE_PROVIDER_SITE_OTHER): Payer: Medicare Other | Admitting: Pulmonary Disease

## 2014-08-02 ENCOUNTER — Encounter: Payer: Self-pay | Admitting: Pulmonary Disease

## 2014-08-02 VITALS — BP 110/56 | HR 86 | Temp 97.6°F | Ht 67.0 in | Wt 186.4 lb

## 2014-08-02 DIAGNOSIS — G4733 Obstructive sleep apnea (adult) (pediatric): Secondary | ICD-10-CM

## 2014-08-02 NOTE — Assessment & Plan Note (Signed)
The patient has been doing very well with CPAP, and feels that it improves his sleep as well as eliminating his snoring. His current machine is not functioning properly, and will need to be replaced.

## 2014-08-02 NOTE — Progress Notes (Signed)
   Subjective:    Patient ID: Stanley [name], male    DOB: Feb 10, 1925, 80 y.o.   MRN: [id number]  HPI The patient comes in today for followup of his obstructive sleep apnea. He is wearing CPAP compliantly, and has responded well to the treatment overall. He has been having recent issues with his CPAP device cutting off spontaneously, and also not functioning properly. He is in need of a new CPAP device. He is currently using a nasal mask with a good fit.   Review of Systems  Constitutional: Negative for fever and unexpected weight change.  HENT: Negative for congestion, dental problem, ear pain, nosebleeds, postnasal drip, rhinorrhea, sinus pressure, sneezing, sore throat and trouble swallowing.   Eyes: Negative for redness and itching.  Respiratory: Negative for cough, chest tightness, shortness of breath and wheezing.   Cardiovascular: Negative for palpitations and leg swelling.  Gastrointestinal: Negative for nausea and vomiting.  Genitourinary: Negative for dysuria.  Musculoskeletal: Negative for joint swelling.  Skin: Negative for rash.  Neurological: Negative for headaches.  Hematological: Does not bruise/bleed easily.  Psychiatric/Behavioral: Negative for dysphoric mood. The patient is not nervous/anxious.        Objective:   Physical Exam Well-developed male in no acute distress Nose without purulence or discharge noted No skin breakdown or pressure necrosis from the CPAP mask Alert and oriented, does not appear to be sleepy, moves all 4 extremities.       Assessment & Plan:

## 2014-08-02 NOTE — Patient Instructions (Signed)
Will get you a new cpap machine, and set on same pressure. Keep up with mask changes and supplies. followup with me again in one year or so.

## 2014-08-10 ENCOUNTER — Other Ambulatory Visit: Payer: Self-pay | Admitting: Family Medicine

## 2014-08-29 ENCOUNTER — Encounter: Payer: Self-pay | Admitting: Family Medicine

## 2014-09-03 ENCOUNTER — Other Ambulatory Visit: Payer: Self-pay | Admitting: Family Medicine

## 2014-09-17 ENCOUNTER — Telehealth: Payer: Self-pay | Admitting: Family Medicine

## 2014-09-17 NOTE — Telephone Encounter (Signed)
Yes, okay to schedule early

## 2014-09-17 NOTE — Telephone Encounter (Signed)
Pt is sch for cpx on 11-05-14. Pt would like earlier appt date. Can I create 30  Min slot?

## 2014-09-18 NOTE — Telephone Encounter (Signed)
Pt has been sch to 10/02/14

## 2014-09-25 ENCOUNTER — Other Ambulatory Visit: Payer: Self-pay | Admitting: Family Medicine

## 2014-09-25 ENCOUNTER — Other Ambulatory Visit: Payer: Self-pay | Admitting: *Deleted

## 2014-09-25 ENCOUNTER — Other Ambulatory Visit (INDEPENDENT_AMBULATORY_CARE_PROVIDER_SITE_OTHER): Payer: Medicare Other

## 2014-09-25 DIAGNOSIS — E119 Type 2 diabetes mellitus without complications: Secondary | ICD-10-CM

## 2014-09-26 LAB — HEMOGLOBIN A1C: HEMOGLOBIN A1C: 6.5 % (ref 4.6–6.5)

## 2014-10-02 ENCOUNTER — Ambulatory Visit (INDEPENDENT_AMBULATORY_CARE_PROVIDER_SITE_OTHER): Payer: Medicare Other | Admitting: Family Medicine

## 2014-10-02 ENCOUNTER — Encounter: Payer: Self-pay | Admitting: Family Medicine

## 2014-10-02 VITALS — BP 137/65 | HR 73 | Temp 97.7°F | Ht 66.0 in | Wt 190.0 lb

## 2014-10-02 DIAGNOSIS — D509 Iron deficiency anemia, unspecified: Secondary | ICD-10-CM

## 2014-10-02 DIAGNOSIS — Z23 Encounter for immunization: Secondary | ICD-10-CM

## 2014-10-02 DIAGNOSIS — E119 Type 2 diabetes mellitus without complications: Secondary | ICD-10-CM

## 2014-10-02 DIAGNOSIS — R2681 Unsteadiness on feet: Secondary | ICD-10-CM | POA: Insufficient documentation

## 2014-10-02 DIAGNOSIS — I2510 Atherosclerotic heart disease of native coronary artery without angina pectoris: Secondary | ICD-10-CM

## 2014-10-02 DIAGNOSIS — I10 Essential (primary) hypertension: Secondary | ICD-10-CM

## 2014-10-02 DIAGNOSIS — E038 Other specified hypothyroidism: Secondary | ICD-10-CM

## 2014-10-02 LAB — POCT URINALYSIS DIPSTICK
Blood, UA: NEGATIVE
Glucose, UA: NEGATIVE
Leukocytes, UA: NEGATIVE
NITRITE UA: NEGATIVE
PH UA: 5.5
Spec Grav, UA: 1.01
UROBILINOGEN UA: 0.2

## 2014-10-02 LAB — CBC WITH DIFFERENTIAL/PLATELET
BASOS ABS: 0 10*3/uL (ref 0.0–0.1)
Basophils Relative: 0.7 % (ref 0.0–3.0)
Eosinophils Absolute: 0.4 10*3/uL (ref 0.0–0.7)
Eosinophils Relative: 5.4 % — ABNORMAL HIGH (ref 0.0–5.0)
HCT: 29.7 % — ABNORMAL LOW (ref 39.0–52.0)
Hemoglobin: 9.6 g/dL — ABNORMAL LOW (ref 13.0–17.0)
LYMPHS PCT: 25.9 % (ref 12.0–46.0)
Lymphs Abs: 1.7 10*3/uL (ref 0.7–4.0)
MCHC: 32.2 g/dL (ref 30.0–36.0)
MCV: 82.2 fl (ref 78.0–100.0)
Monocytes Absolute: 1.1 10*3/uL — ABNORMAL HIGH (ref 0.1–1.0)
Monocytes Relative: 16.7 % — ABNORMAL HIGH (ref 3.0–12.0)
NEUTROS ABS: 3.4 10*3/uL (ref 1.4–7.7)
Neutrophils Relative %: 51.3 % (ref 43.0–77.0)
PLATELETS: 241 10*3/uL (ref 150.0–400.0)
RBC: 3.61 Mil/uL — ABNORMAL LOW (ref 4.22–5.81)
RDW: 16.5 % — AB (ref 11.5–15.5)
WBC: 6.6 10*3/uL (ref 4.0–10.5)

## 2014-10-02 LAB — HEPATIC FUNCTION PANEL
ALBUMIN: 3.6 g/dL (ref 3.5–5.2)
ALT: 13 U/L (ref 0–53)
AST: 21 U/L (ref 0–37)
Alkaline Phosphatase: 63 U/L (ref 39–117)
Bilirubin, Direct: 0 mg/dL (ref 0.0–0.3)
Total Bilirubin: 0.5 mg/dL (ref 0.2–1.2)
Total Protein: 7.1 g/dL (ref 6.0–8.3)

## 2014-10-02 LAB — LIPID PANEL
CHOLESTEROL: 138 mg/dL (ref 0–200)
HDL: 39.2 mg/dL (ref >39.00)
LDL CALC: 67 mg/dL (ref 0–99)
NonHDL: 98.8
TRIGLYCERIDES: 159 mg/dL — AB (ref 0.0–149.0)
Total CHOL/HDL Ratio: 4
VLDL: 31.8 mg/dL (ref 0.0–40.0)

## 2014-10-02 LAB — TSH: TSH: 1.16 u[IU]/mL (ref 0.35–4.50)

## 2014-10-02 LAB — BASIC METABOLIC PANEL
BUN: 25 mg/dL — ABNORMAL HIGH (ref 6–23)
CHLORIDE: 104 meq/L (ref 96–112)
CO2: 22 mEq/L (ref 19–32)
CREATININE: 1.1 mg/dL (ref 0.4–1.5)
Calcium: 9.2 mg/dL (ref 8.4–10.5)
GFR: 65.25 mL/min (ref >60.00)
GLUCOSE: 95 mg/dL (ref 70–99)
POTASSIUM: 5 meq/L (ref 3.5–5.1)
Sodium: 136 mEq/L (ref 135–145)

## 2014-10-02 LAB — MICROALBUMIN / CREATININE URINE RATIO
Creatinine,U: 195.4 mg/dL
MICROALB UR: 4.1 mg/dL — AB (ref 0.0–1.9)
MICROALB/CREAT RATIO: 2.1 mg/g (ref 0.0–30.0)

## 2014-10-02 LAB — FERRITIN: Ferritin: 15.4 ng/mL — ABNORMAL LOW (ref 22.0–322.0)

## 2014-10-02 LAB — IRON: Iron: 29 ug/dL — ABNORMAL LOW (ref 42–165)

## 2014-10-02 MED ORDER — PIOGLITAZONE HCL 30 MG PO TABS: ORAL_TABLET | ORAL | Status: DC

## 2014-10-02 MED ORDER — HYDROCODONE-ACETAMINOPHEN 10-325 MG PO TABS: 1.0000 | ORAL_TABLET | Freq: Four times a day (QID) | ORAL | Status: DC | PRN

## 2014-10-02 MED ORDER — POLYSACCHARIDE IRON COMPLEX 150 MG PO CAPS: 150.0000 mg | ORAL_CAPSULE | Freq: Two times a day (BID) | ORAL | Status: DC

## 2014-10-02 MED ORDER — SITAGLIPTIN PHOS-METFORMIN HCL 50-1000 MG PO TABS: ORAL_TABLET | ORAL | Status: DC

## 2014-10-02 MED ORDER — DICLOFENAC SODIUM 75 MG PO TBEC: DELAYED_RELEASE_TABLET | ORAL | Status: DC

## 2014-10-02 MED ORDER — POLYSACCHARIDE IRON COMPLEX 150 MG PO CAPS: ORAL_CAPSULE | ORAL | Status: DC

## 2014-10-02 MED ORDER — DUTASTERIDE 0.5 MG PO CAPS: 0.5000 mg | ORAL_CAPSULE | Freq: Every day | ORAL | Status: DC

## 2014-10-02 MED ORDER — MIRABEGRON ER 25 MG PO TB24: 25.0000 mg | ORAL_TABLET | Freq: Every day | ORAL | Status: DC

## 2014-10-02 MED ORDER — ESOMEPRAZOLE MAGNESIUM 40 MG PO CPDR: 40.0000 mg | DELAYED_RELEASE_CAPSULE | Freq: Every day | ORAL | Status: DC

## 2014-10-02 NOTE — Progress Notes (Signed)
   Subjective:    Patient ID: Stanley [name], male    DOB: Feb 20, 1925, 80 y.o.   MRN: [id number]  HPI 80 yr old male for a cpx. He is doing well in general but he has had some low glucoses, which he thinks occur from taking Glipizide. He has decreased this from bid to once a day but it still occurs. His A1c last week was 6.5. He has had some trouble walking, and part of this is due to some balance issues. He is getting new orthotic shoes and inserts soon, and these should help. He will see Dr. Aundra Dubin soon for a cardiac evaluation. He asks if we can take over caring for his urologic system instead of seeing Dr. Risa Grill.    Review of Systems  HENT: Negative.   Eyes: Negative.   Respiratory: Negative.   Cardiovascular: Negative.   Gastrointestinal: Negative.   Genitourinary: Negative.   Musculoskeletal: Negative.   Skin: Negative.   Neurological: Positive for weakness. Negative for dizziness, tremors, seizures, syncope, facial asymmetry, speech difficulty, light-headedness, numbness and headaches.  Psychiatric/Behavioral: Negative.        Objective:   Physical Exam  Constitutional: He is oriented to person, place, and time. He appears well-developed and well-nourished. No distress.  HENT:  Head: Normocephalic and atraumatic.  Right Ear: External ear normal.  Left Ear: External ear normal.  Nose: Nose normal.  Mouth/Throat: Oropharynx is clear and moist. No oropharyngeal exudate.  Eyes: Conjunctivae and EOM are normal. Pupils are equal, round, and reactive to light. Right eye exhibits no discharge. Left eye exhibits no discharge. No scleral icterus.  Neck: Neck supple. No JVD present. No tracheal deviation present. No thyromegaly present.  Cardiovascular: Normal rate, regular rhythm, normal heart sounds and intact distal pulses.  Exam reveals no gallop and no friction rub.   No murmur heard. Pulmonary/Chest: Effort normal and breath sounds normal. No respiratory distress. He has no  wheezes. He has no rales. He exhibits no tenderness.  Abdominal: Soft. Bowel sounds are normal. He exhibits no distension and no mass. There is no tenderness. There is no rebound and no guarding.  Genitourinary: Rectum normal, prostate normal and penis normal. Guaiac negative stool. No penile tenderness.  Musculoskeletal: Normal range of motion. He exhibits no edema and no tenderness.  Lymphadenopathy:    He has no cervical adenopathy.  Neurological: He is alert and oriented to person, place, and time. He has normal reflexes. No cranial nerve deficit. He exhibits normal muscle tone. Coordination normal.  Skin: Skin is warm and dry. No rash noted. He is not diaphoretic. No erythema. No pallor.  Psychiatric: He has a normal mood and affect. His behavior is normal. Judgment and thought content normal.          Assessment & Plan:  Well exam. Get fasting labs. We will stop the Glipizide. Refer to PT for gait training.

## 2014-10-02 NOTE — Addendum Note (Signed)
Addended by: Alysia Penna A on: 10/02/2014 01:55 PM   Modules accepted: Orders

## 2014-10-03 ENCOUNTER — Telehealth: Payer: Self-pay | Admitting: Family Medicine

## 2014-10-03 NOTE — Telephone Encounter (Signed)
[facility] EMAILED  °

## 2014-10-03 NOTE — Addendum Note (Signed)
Addended by: Alysia Penna A on: 10/03/2014 12:44 PM   Modules accepted: Orders

## 2014-10-07 ENCOUNTER — Telehealth: Payer: Self-pay | Admitting: Hematology and Oncology

## 2014-10-07 NOTE — Telephone Encounter (Signed)
S/W PATIENT AND GAVE NP APPT FOR 10/29 @ 10 W/DR. MOHAMED.  [place]

## 2014-10-08 ENCOUNTER — Telehealth: Payer: Self-pay | Admitting: Family Medicine

## 2014-10-08 ENCOUNTER — Telehealth: Payer: Self-pay | Admitting: Internal Medicine

## 2014-10-08 ENCOUNTER — Ambulatory Visit: Payer: Medicare Other | Admitting: Physical Therapy

## 2014-10-08 ENCOUNTER — Other Ambulatory Visit: Payer: Self-pay | Admitting: Family Medicine

## 2014-10-08 MED ORDER — POLYSACCHARIDE IRON COMPLEX 150 MG PO CAPS: 150.0000 mg | ORAL_CAPSULE | Freq: Two times a day (BID) | ORAL | Status: DC

## 2014-10-08 NOTE — Telephone Encounter (Signed)
C/D ON 10/20 FOR NP APPT ON 10/21 W/DR. MOHAMED.

## 2014-10-08 NOTE — Telephone Encounter (Signed)
C/D ON 10/20 FOR NP APPT FOR 10/21 W/DR. MOHAMED.

## 2014-10-08 NOTE — Telephone Encounter (Signed)
I had to resend script for Ferrex to mail order.

## 2014-10-09 ENCOUNTER — Telehealth: Payer: Self-pay | Admitting: Medical Oncology

## 2014-10-09 ENCOUNTER — Other Ambulatory Visit: Payer: Self-pay | Admitting: Medical Oncology

## 2014-10-09 ENCOUNTER — Other Ambulatory Visit (HOSPITAL_BASED_OUTPATIENT_CLINIC_OR_DEPARTMENT_OTHER): Payer: Medicare Other

## 2014-10-09 ENCOUNTER — Encounter: Payer: Self-pay | Admitting: Internal Medicine

## 2014-10-09 ENCOUNTER — Other Ambulatory Visit: Payer: Self-pay | Admitting: Internal Medicine

## 2014-10-09 ENCOUNTER — Ambulatory Visit (HOSPITAL_BASED_OUTPATIENT_CLINIC_OR_DEPARTMENT_OTHER): Payer: Medicare Other | Admitting: Internal Medicine

## 2014-10-09 ENCOUNTER — Telehealth: Payer: Self-pay | Admitting: Internal Medicine

## 2014-10-09 ENCOUNTER — Telehealth: Payer: Self-pay | Admitting: *Deleted

## 2014-10-09 ENCOUNTER — Ambulatory Visit: Payer: Medicare Other

## 2014-10-09 VITALS — BP 143/49 | HR 81 | Temp 97.5°F | Resp 18 | Ht 66.0 in | Wt 194.1 lb

## 2014-10-09 DIAGNOSIS — D509 Iron deficiency anemia, unspecified: Secondary | ICD-10-CM

## 2014-10-09 DIAGNOSIS — D649 Anemia, unspecified: Secondary | ICD-10-CM

## 2014-10-09 LAB — CBC & DIFF AND RETIC
BASO%: 0.4 % (ref 0.0–2.0)
BASOS ABS: 0 10*3/uL (ref 0.0–0.1)
EOS%: 4.9 % (ref 0.0–7.0)
Eosinophils Absolute: 0.3 10*3/uL (ref 0.0–0.5)
HEMATOCRIT: 28 % — AB (ref 38.4–49.9)
HEMOGLOBIN: 9 g/dL — AB (ref 13.0–17.1)
Immature Retic Fract: 12.7 % — ABNORMAL HIGH (ref 3.00–10.60)
LYMPH%: 21.6 % (ref 14.0–49.0)
MCH: 26.4 pg — ABNORMAL LOW (ref 27.2–33.4)
MCHC: 32.1 g/dL (ref 32.0–36.0)
MCV: 82.1 fL (ref 79.3–98.0)
MONO#: 0.7 10*3/uL (ref 0.1–0.9)
MONO%: 11.8 % (ref 0.0–14.0)
NEUT#: 3.5 10*3/uL (ref 1.5–6.5)
NEUT%: 61.3 % (ref 39.0–75.0)
PLATELETS: 205 10*3/uL (ref 140–400)
RBC: 3.41 10*6/uL — ABNORMAL LOW (ref 4.20–5.82)
RDW: 15.7 % — ABNORMAL HIGH (ref 11.0–14.6)
Retic %: 0.88 % (ref 0.80–1.80)
Retic Ct Abs: 30.01 10*3/uL — ABNORMAL LOW (ref 34.80–93.90)
WBC: 5.7 10*3/uL (ref 4.0–10.3)
lymph#: 1.2 10*3/uL (ref 0.9–3.3)

## 2014-10-09 LAB — COMPREHENSIVE METABOLIC PANEL (CC13)
ALBUMIN: 3.6 g/dL (ref 3.5–5.0)
ALK PHOS: 70 U/L (ref 40–150)
ALT: 15 U/L (ref 0–55)
AST: 17 U/L (ref 5–34)
Anion Gap: 7 mEq/L (ref 3–11)
BILIRUBIN TOTAL: 0.22 mg/dL (ref 0.20–1.20)
BUN: 31.2 mg/dL — AB (ref 7.0–26.0)
CO2: 21 mEq/L — ABNORMAL LOW (ref 22–29)
CREATININE: 1.4 mg/dL — AB (ref 0.7–1.3)
Calcium: 9.3 mg/dL (ref 8.4–10.4)
Chloride: 103 mEq/L (ref 98–109)
GLUCOSE: 162 mg/dL — AB (ref 70–140)
POTASSIUM: 5.3 meq/L — AB (ref 3.5–5.1)
Sodium: 130 mEq/L — ABNORMAL LOW (ref 136–145)
Total Protein: 6.3 g/dL — ABNORMAL LOW (ref 6.4–8.3)

## 2014-10-09 LAB — IRON AND TIBC CHCC
%SAT: 12 % — AB (ref 20–55)
IRON: 48 ug/dL (ref 42–163)
TIBC: 398 ug/dL (ref 202–409)
UIBC: 350 ug/dL (ref 117–376)

## 2014-10-09 LAB — LACTATE DEHYDROGENASE (CC13): LDH: 176 U/L (ref 125–245)

## 2014-10-09 LAB — FERRITIN CHCC: FERRITIN: 28 ng/mL (ref 22–316)

## 2014-10-09 MED ORDER — INTEGRA PLUS PO CAPS: 1.0000 | ORAL_CAPSULE | Freq: Every morning | ORAL | Status: DC

## 2014-10-09 NOTE — Telephone Encounter (Signed)
Integra plus sent to local pharmacy.

## 2014-10-09 NOTE — Telephone Encounter (Signed)
Confirm appt d/t 10/23 & 10/30 for infusion.

## 2014-10-09 NOTE — Progress Notes (Signed)
[place] Telephone:([phone]   [phone]  CONSULT NOTE  REFERRING PHYSICIAN: Dr. Alysia Penna  REASON FOR CONSULTATION:  80 years old white male with persistent anemia.  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 The patient mentions that for the last few months he has been complaining of increasing fatigue and weakness as well as shortness of breath on exertion and tachycardia. He was seen by his primary care physician Dr. Sarajane Jews. CBC performed on 10/02/2014 showed low hemoglobin of 9.6, hematocrit 29.7%. The patient has a history of mild anemia for years but his hemoglobin has always been in the range of 12.0-13.0 G./DL. Serum iron on the same day showed low iron level of 29 and serum ferritin was also low at 15.4. The patient was started one week ago on oral tablets with Ferrex 150 mg by mouth twice a day. He is tolerating it well but he did not notice any improvement in his condition. He was referred to me today for evaluation and consideration of intravenous iron infusion. When seen today he is feeling fine except for the persistent fatigue and shortness of breath. He denied having any dizzy spells. The patient denied having any significant bleeding issues specifically no melena or hematochezia and no other bleeding issues. His last colonoscopy was in 2008 and he had upper endoscopy 18 months ago for evaluation of GERD and it was reported to be normal. He currently takes Nexium on daily basis. Family history significant for her mother and father with diabetes mellitus. Mother died from cerebral aneurysm and father died at age 85 from natural causes. The patient is married and has 2 sons and one stepdaughter.  He is a retired Administrator, [employer]. He was in Network engineer until 2013. He has no history of smoking but drinks alcohol occasionally and no history of drug abuse. HPI  Past Medical History  Diagnosis Date  . Diabetes mellitus   . BPH (benign prostatic hyperplasia)   . Arthritis   . GERD (gastroesophageal reflux disease)   . Neuromuscular disorder   . Hypertension     no meds  . Shortness of breath     uses cpap  . 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 Failed total knee, right   . H pylori ulcer     Gastric ulcer    Past Surgical History  Procedure Laterality Date  . Aortic valve replacement    . Foot arthrotomy Right   . Tonsillectomy    . Cataract extraction, bilateral    . Humerus fracture surgery    . Replacement total knee Bilateral   . Toe amputation      rt 2nd  . Cardiac catheterization    . Amputation  01/19/2012    Procedure: AMPUTATION DIGIT;  Surgeon: Colin Rhein, MD;  Location: [facility];  Service: Orthopedics;  Laterality: Right;  right great toe amputation through MTP joint  . Rotator cuff repair Right     Family History  Problem Relation Age of Onset  . Diabetes type II Mother   . Diabetes type II Father   . Colon cancer Neg Hx   . Esophageal cancer Neg Hx   . Rectal cancer Neg Hx   .  Stomach cancer Neg Hx     Social History History  Substance Use Topics  . Smoking status: Never Smoker   . Smokeless tobacco: Never Used  . Alcohol Use: 4.2 oz/week    7 Glasses of wine per week    Allergies  Allergen Reactions  . Morphine And Related Nausea And Vomiting  . Succinylcholine Other (See Comments)    unknown  . Ace Inhibitors Other (See Comments)    cough    Current Outpatient Prescriptions  Medication Sig Dispense Refill  . ALPRAZolam (XANAX) 0.25 MG tablet Take 1 tablet (0.25 mg total) by mouth every 6 (six) hours as needed.  120 tablet  5  . aspirin 81 MG  tablet Take 81 mg by mouth daily.      . Calcium Carbonate-Vitamin D (CALCIUM-VITAMIN D) 500-200 MG-UNIT per tablet Take 1 tablet by mouth 2 (two) times daily.       . diclofenac (VOLTAREN) 75 MG EC tablet take 1 tablet by mouth twice a day  180 tablet  1  . diphenoxylate-atropine (LOMOTIL) 2.5-0.025 MG per tablet TAKE 1 TABLET 4 TIMES DAILY AS NEEDED FOR DIARRHEA OR LOOSE STOOLS.  60 tablet  3  . dutasteride (AVODART) 0.5 MG capsule Take 1 capsule (0.5 mg total) by mouth daily.  90 capsule  3  . esomeprazole (NEXIUM) 40 MG capsule Take 1 capsule (40 mg total) by mouth daily.  90 capsule  3  . glucose blood (ONE TOUCH TEST STRIPS) test strip Dispense one touch ultra, test once per day and diagnosis code is 250.00  100 each  3  . HYDROcodone-acetaminophen (NORCO) 10-325 MG per tablet Take 1 tablet by mouth every 6 (six) hours as needed for severe pain.  120 tablet  0  . iron polysaccharides (FERREX 150) 150 MG capsule Take 1 capsule (150 mg total) by mouth 2 (two) times daily.  180 capsule  3  . losartan (COZAAR) 25 MG tablet Take 1 tablet (25 mg total) by mouth daily.  90 tablet  3  . mirabegron ER (MYRBETRIQ) 25 MG TB24 tablet Take 1 tablet (25 mg total) by mouth daily.  90 tablet  3  . Multiple Vitamin (MULITIVITAMIN WITH MINERALS) TABS Take 1 tablet by mouth daily.      . pioglitazone (ACTOS) 30 MG tablet take 1 tablet by mouth once daily  90 tablet  3  . rosuvastatin (CRESTOR) 20 MG tablet Take 10 mg by mouth daily.       . sitaGLIPtin-metformin (JANUMET) 50-1000 MG per tablet take 1 tablet by mouth twice a day WITH A MEAL.  180 tablet  3  . tamsulosin (FLOMAX) 0.4 MG CAPS capsule       . clotrimazole-betamethasone (LOTRISONE) cream       . FeFum-FePoly-FA-B Cmp-C-Biot (INTEGRA PLUS) CAPS Take 1 capsule by mouth every morning.  30 capsule  3  . prochlorperazine (COMPAZINE) 10 MG tablet take 1 tablet by mouth four times a day for nausea  100 tablet  3  . [DISCONTINUED] solifenacin (VESICARE) 10  MG tablet Take 10 mg by mouth daily.       No current facility-administered medications for this visit.    Review of Systems  Constitutional: positive for fatigue Eyes: negative Ears, nose, mouth, throat, and face: negative Respiratory: positive for dyspnea on exertion Cardiovascular: negative Gastrointestinal: negative Genitourinary:negative Integument/breast: negative Hematologic/lymphatic: negative Musculoskeletal:negative Neurological: negative Behavioral/Psych: negative Endocrine: negative Allergic/Immunologic: negative  Physical Exam  QIO:NGEXB, healthy, no distress, well nourished and well  developed SKIN: skin color, texture, turgor are normal, no rashes or significant lesions HEAD: Normocephalic, No masses, lesions, tenderness or abnormalities EYES: normal, PERRLA EARS: External ears normal, Canals clear OROPHARYNX:no exudate, no erythema and lips, buccal mucosa, and tongue normal  NECK: supple, no adenopathy, no JVD LYMPH:  no palpable lymphadenopathy, no hepatosplenomegaly LUNGS: clear to auscultation , and palpation HEART: regular rate & rhythm and no murmurs ABDOMEN:abdomen soft, non-tender, normal bowel sounds and no masses or organomegaly BACK: Back symmetric, no curvature., No CVA tenderness EXTREMITIES:no joint deformities, effusion, or inflammation, no edema, no skin discoloration, no clubbing  NEURO: alert & oriented x 3 with fluent speech, no focal motor/sensory deficits  PERFORMANCE STATUS: ECOG 1  LABORATORY DATA: Lab Results  Component Value Date   WBC 5.7 10/09/2014   HGB 9.0* 10/09/2014   HCT 28.0* 10/09/2014   MCV 82.1 10/09/2014   PLT 205 10/09/2014      Chemistry      Component Value Date/Time   NA 136 10/02/2014 0936   K 5.0 10/02/2014 0936   CL 104 10/02/2014 0936   CO2 22 10/02/2014 0936   BUN 25* 10/02/2014 0936   CREATININE 1.1 10/02/2014 0936      Component Value Date/Time   CALCIUM 9.2 10/02/2014 0936   ALKPHOS 63  10/02/2014 0936   AST 21 10/02/2014 0936   ALT 13 10/02/2014 0936   BILITOT 0.5 10/02/2014 0936       RADIOGRAPHIC STUDIES: No results found.  ASSESSMENT: This is a very pleasant 80 years old white male who presented for evaluation of persistent anemia most likely iron deficiency anemia which did not respond to the oral iron tablets but this was only tried for a short period.   PLAN: I had a lengthy discussion today with Dr. [name] about his current condition. I ordered several studies to rule out any other etiology for his anemia including repeat CBC, comprehensive metabolic panel, LDH, serum erythropoietin, serum protein electrophoresis as well as iron study and ferritin. I will order stool for Hemoccult to rule out any rectal bleeding. I discussed with the patient and the treatment with continued on iron tablets versus proceeding with iron infusion in the form of Feraheme. He is interested on the IV iron. I will arrange for him to receive Feraheme 510 mg IV weekly for 2 doses. He started after his dose of this treatment on 10/11/2014. I would also change his oral iron to Integra plus 1 capsule by mouth daily. He will come back for followup visit in 2 months for reevaluation with repeat CBC, iron study and ferritin. He was advised to call immediately if he has any concerning symptoms in the interval.  The patient voices understanding of current disease status and treatment options and is in agreement with the current care plan.  All questions were answered. The patient knows to call the clinic with any problems, questions or concerns. We can certainly see the patient much sooner if necessary.  Thank you so much for allowing me to participate in the care of Stanley [name]. I will continue to follow up the patient with you and assist in his care.  I spent 30 minutes counseling the patient face to face. The total time spent in the appointment was 55 minutes.  Disclaimer: This note  was dictated with voice recognition software. Similar sounding words can inadvertently be transcribed and may not be corrected upon review.   Marijke Guadiana K. 10/09/2014, 12:42 PM

## 2014-10-09 NOTE — Telephone Encounter (Signed)
Gave avs & cal for Nov. Sent MW a mess to sch infusion.

## 2014-10-09 NOTE — Progress Notes (Signed)
Checked in new patient with no financial issues prior to seeing the dr. He has [employer] insurance . He has appt card.

## 2014-10-09 NOTE — Telephone Encounter (Signed)
Per staff message and POF I have scheduled appts. Advised scheduler of appts. JMW  

## 2014-10-10 NOTE — Progress Notes (Signed)
Quick Note:  Call patient with the result and He will need to check with his PCP regarding changing BP medicine causing high K level. ______

## 2014-10-11 ENCOUNTER — Ambulatory Visit (HOSPITAL_BASED_OUTPATIENT_CLINIC_OR_DEPARTMENT_OTHER): Payer: Medicare Other

## 2014-10-11 ENCOUNTER — Telehealth: Payer: Self-pay | Admitting: Family Medicine

## 2014-10-11 ENCOUNTER — Telehealth: Payer: Self-pay | Admitting: *Deleted

## 2014-10-11 VITALS — BP 104/44 | HR 49 | Temp 97.9°F | Resp 18

## 2014-10-11 DIAGNOSIS — D649 Anemia, unspecified: Secondary | ICD-10-CM

## 2014-10-11 DIAGNOSIS — D509 Iron deficiency anemia, unspecified: Secondary | ICD-10-CM

## 2014-10-11 LAB — PROTEIN ELECTROPHORESIS, SERUM, WITH REFLEX
ALPHA-2-GLOBULIN: 10.2 % (ref 7.1–11.8)
Albumin ELP: 61 % (ref 55.8–66.1)
Alpha-1-Globulin: 4.4 % (ref 2.9–4.9)
Beta 2: 5.6 % (ref 3.2–6.5)
Beta Globulin: 7.8 % — ABNORMAL HIGH (ref 4.7–7.2)
GAMMA GLOBULIN: 11 % — AB (ref 11.1–18.8)
Total Protein, Serum Electrophoresis: 6.2 g/dL (ref 6.0–8.3)

## 2014-10-11 LAB — ERYTHROPOIETIN: Erythropoietin: 12.6 m[IU]/mL (ref 2.6–18.5)

## 2014-10-11 LAB — FECAL OCCULT BLOOD, GUAIAC: Occult Blood: POSITIVE

## 2014-10-11 MED ORDER — SODIUM CHLORIDE 0.9 % IV SOLN
Freq: Once | INTRAVENOUS | Status: AC
  Administered 2014-10-11: 14:00:00 via INTRAVENOUS

## 2014-10-11 MED ORDER — SODIUM CHLORIDE 0.9 % IV SOLN
510.0000 mg | Freq: Once | INTRAVENOUS | Status: AC
  Administered 2014-10-11: 510 mg via INTRAVENOUS
  Filled 2014-10-11: qty 17

## 2014-10-11 MED ORDER — FERUMOXYTOL INJECTION 510 MG/17 ML
510.0000 mg | Freq: Once | INTRAVENOUS | Status: DC
  Filled 2014-10-11: qty 17

## 2014-10-11 NOTE — Progress Notes (Signed)
Quick Note:  Call patient with the result and we need to refer to GI. Please find out if he has a Gi physician ______

## 2014-10-11 NOTE — Patient Instructions (Signed)

## 2014-10-11 NOTE — Telephone Encounter (Signed)
Message copied by Comer Devins L on Fri Oct 11, 2014 11:45 AM ------      Message from: Curt Bears      Created: Thu Oct 10, 2014  8:29 AM       Call patient with the result and He will need to check with his PCP regarding changing BP medicine causing high K level. ------

## 2014-10-11 NOTE — Telephone Encounter (Signed)
Calling to report bp has been running low and thinks it due to taking too much of medication.  Pt is considering holding his losartan (COZAAR) 25 MG tablet until Monday. Last BP reading was 104/74.

## 2014-10-11 NOTE — Telephone Encounter (Signed)
Called and spoke to pt regarding K 5.3.  He verbalized understanding.

## 2014-10-14 ENCOUNTER — Telehealth: Payer: Self-pay | Admitting: Medical Oncology

## 2014-10-14 ENCOUNTER — Encounter (HOSPITAL_COMMUNITY): Payer: Self-pay | Admitting: Internal Medicine

## 2014-10-14 ENCOUNTER — Telehealth: Payer: Self-pay | Admitting: Oncology

## 2014-10-14 ENCOUNTER — Inpatient Hospital Stay (HOSPITAL_COMMUNITY): Payer: Medicare Other

## 2014-10-14 ENCOUNTER — Inpatient Hospital Stay (HOSPITAL_COMMUNITY)
Admission: AD | Admit: 2014-10-14 | Discharge: 2014-10-17 | DRG: 988 | Disposition: A | Discharge status: Home / Self Care | Payer: Medicare Other | Source: Ambulatory Visit | Attending: Internal Medicine | Admitting: Internal Medicine

## 2014-10-14 ENCOUNTER — Other Ambulatory Visit: Payer: Self-pay | Admitting: Family Medicine

## 2014-10-14 ENCOUNTER — Telehealth: Payer: Self-pay | Admitting: Family Medicine

## 2014-10-14 DIAGNOSIS — Z8711 Personal history of peptic ulcer disease: Secondary | ICD-10-CM

## 2014-10-14 DIAGNOSIS — I10 Essential (primary) hypertension: Secondary | ICD-10-CM | POA: Diagnosis present

## 2014-10-14 DIAGNOSIS — Z79899 Other long term (current) drug therapy: Secondary | ICD-10-CM

## 2014-10-14 DIAGNOSIS — Z953 Presence of xenogenic heart valve: Secondary | ICD-10-CM | POA: Diagnosis not present

## 2014-10-14 DIAGNOSIS — E1140 Type 2 diabetes mellitus with diabetic neuropathy, unspecified: Secondary | ICD-10-CM | POA: Diagnosis present

## 2014-10-14 DIAGNOSIS — Z888 Allergy status to other drugs, medicaments and biological substances status: Secondary | ICD-10-CM | POA: Diagnosis not present

## 2014-10-14 DIAGNOSIS — R001 Bradycardia, unspecified: Secondary | ICD-10-CM | POA: Diagnosis present

## 2014-10-14 DIAGNOSIS — I459 Conduction disorder, unspecified: Secondary | ICD-10-CM | POA: Diagnosis present

## 2014-10-14 DIAGNOSIS — D509 Iron deficiency anemia, unspecified: Secondary | ICD-10-CM | POA: Diagnosis present

## 2014-10-14 DIAGNOSIS — Z9842 Cataract extraction status, left eye: Secondary | ICD-10-CM

## 2014-10-14 DIAGNOSIS — Z952 Presence of prosthetic heart valve: Secondary | ICD-10-CM

## 2014-10-14 DIAGNOSIS — I951 Orthostatic hypotension: Secondary | ICD-10-CM | POA: Diagnosis present

## 2014-10-14 DIAGNOSIS — Z885 Allergy status to narcotic agent status: Secondary | ICD-10-CM | POA: Diagnosis not present

## 2014-10-14 DIAGNOSIS — K5790 Diverticulosis of intestine, part unspecified, without perforation or abscess without bleeding: Secondary | ICD-10-CM | POA: Diagnosis present

## 2014-10-14 DIAGNOSIS — K648 Other hemorrhoids: Secondary | ICD-10-CM | POA: Diagnosis present

## 2014-10-14 DIAGNOSIS — I25700 Atherosclerosis of coronary artery bypass graft(s), unspecified, with unstable angina pectoris: Secondary | ICD-10-CM

## 2014-10-14 DIAGNOSIS — Z79891 Long term (current) use of opiate analgesic: Secondary | ICD-10-CM

## 2014-10-14 DIAGNOSIS — K219 Gastro-esophageal reflux disease without esophagitis: Secondary | ICD-10-CM | POA: Diagnosis present

## 2014-10-14 DIAGNOSIS — I739 Peripheral vascular disease, unspecified: Secondary | ICD-10-CM | POA: Diagnosis present

## 2014-10-14 DIAGNOSIS — I2510 Atherosclerotic heart disease of native coronary artery without angina pectoris: Secondary | ICD-10-CM | POA: Diagnosis present

## 2014-10-14 DIAGNOSIS — R0602 Shortness of breath: Secondary | ICD-10-CM | POA: Diagnosis present

## 2014-10-14 DIAGNOSIS — Z7982 Long term (current) use of aspirin: Secondary | ICD-10-CM | POA: Diagnosis not present

## 2014-10-14 DIAGNOSIS — D120 Benign neoplasm of cecum: Secondary | ICD-10-CM | POA: Diagnosis present

## 2014-10-14 DIAGNOSIS — Z9841 Cataract extraction status, right eye: Secondary | ICD-10-CM

## 2014-10-14 DIAGNOSIS — N400 Enlarged prostate without lower urinary tract symptoms: Secondary | ICD-10-CM | POA: Diagnosis present

## 2014-10-14 DIAGNOSIS — R262 Difficulty in walking, not elsewhere classified: Secondary | ICD-10-CM | POA: Diagnosis present

## 2014-10-14 DIAGNOSIS — Z951 Presence of aortocoronary bypass graft: Secondary | ICD-10-CM | POA: Diagnosis not present

## 2014-10-14 DIAGNOSIS — I5022 Chronic systolic (congestive) heart failure: Secondary | ICD-10-CM | POA: Diagnosis present

## 2014-10-14 DIAGNOSIS — Z833 Family history of diabetes mellitus: Secondary | ICD-10-CM | POA: Diagnosis not present

## 2014-10-14 DIAGNOSIS — M1990 Unspecified osteoarthritis, unspecified site: Secondary | ICD-10-CM | POA: Diagnosis present

## 2014-10-14 DIAGNOSIS — D649 Anemia, unspecified: Secondary | ICD-10-CM | POA: Diagnosis present

## 2014-10-14 DIAGNOSIS — Z791 Long term (current) use of non-steroidal anti-inflammatories (NSAID): Secondary | ICD-10-CM | POA: Diagnosis not present

## 2014-10-14 DIAGNOSIS — K635 Polyp of colon: Secondary | ICD-10-CM | POA: Diagnosis present

## 2014-10-14 DIAGNOSIS — Z96653 Presence of artificial knee joint, bilateral: Secondary | ICD-10-CM | POA: Diagnosis present

## 2014-10-14 DIAGNOSIS — R195 Other fecal abnormalities: Secondary | ICD-10-CM

## 2014-10-14 DIAGNOSIS — I441 Atrioventricular block, second degree: Secondary | ICD-10-CM

## 2014-10-14 DIAGNOSIS — I255 Ischemic cardiomyopathy: Secondary | ICD-10-CM | POA: Diagnosis present

## 2014-10-14 DIAGNOSIS — R0609 Other forms of dyspnea: Secondary | ICD-10-CM

## 2014-10-14 DIAGNOSIS — K922 Gastrointestinal hemorrhage, unspecified: Secondary | ICD-10-CM | POA: Diagnosis present

## 2014-10-14 DIAGNOSIS — G4733 Obstructive sleep apnea (adult) (pediatric): Secondary | ICD-10-CM | POA: Diagnosis present

## 2014-10-14 DIAGNOSIS — Z954 Presence of other heart-valve replacement: Secondary | ICD-10-CM

## 2014-10-14 HISTORY — DX: Atrioventricular block, second degree: I44.1

## 2014-10-14 HISTORY — DX: Nonrheumatic aortic (valve) stenosis: I35.0

## 2014-10-14 HISTORY — DX: Atherosclerotic heart disease of native coronary artery without angina pectoris: I25.10

## 2014-10-14 HISTORY — DX: Anemia, unspecified: D64.9

## 2014-10-14 LAB — GLUCOSE, CAPILLARY
GLUCOSE-CAPILLARY: 100 mg/dL — AB (ref 70–99)
Glucose-Capillary: 263 mg/dL — ABNORMAL HIGH (ref 70–99)
Glucose-Capillary: 234 mg/dL — ABNORMAL HIGH (ref 70–99)

## 2014-10-14 LAB — ABO/RH: ABO/RH(D): O POS

## 2014-10-14 LAB — CBC
HEMATOCRIT: 27.3 % — AB (ref 39.0–52.0)
Hemoglobin: 9 g/dL — ABNORMAL LOW (ref 13.0–17.0)
MCH: 27.1 pg (ref 26.0–34.0)
MCHC: 33 g/dL (ref 30.0–36.0)
MCV: 82.2 fL (ref 78.0–100.0)
Platelets: 224 10*3/uL (ref 150–400)
RBC: 3.32 MIL/uL — ABNORMAL LOW (ref 4.22–5.81)
RDW: 15.7 % — AB (ref 11.5–15.5)
WBC: 7.4 10*3/uL (ref 4.0–10.5)

## 2014-10-14 LAB — COMPREHENSIVE METABOLIC PANEL
ALBUMIN: 3.6 g/dL (ref 3.5–5.2)
ALT: 16 U/L (ref 0–53)
AST: 18 U/L (ref 0–37)
Alkaline Phosphatase: 73 U/L (ref 39–117)
Anion gap: 13 (ref 5–15)
BUN: 36 mg/dL — AB (ref 6–23)
CO2: 21 mEq/L (ref 19–32)
Calcium: 8.9 mg/dL (ref 8.4–10.5)
Chloride: 96 mEq/L (ref 96–112)
Creatinine, Ser: 1.29 mg/dL (ref 0.50–1.35)
GFR calc non Af Amer: 48 mL/min — ABNORMAL LOW (ref >90)
GFR, EST AFRICAN AMERICAN: 56 mL/min — AB (ref >90)
GLUCOSE: 128 mg/dL — AB (ref 70–99)
Potassium: 5.9 mEq/L — ABNORMAL HIGH (ref 3.7–5.3)
Sodium: 130 mEq/L — ABNORMAL LOW (ref 137–147)
TOTAL PROTEIN: 6.3 g/dL (ref 6.0–8.3)
Total Bilirubin: <0.2 mg/dL — ABNORMAL LOW (ref 0.3–1.2)

## 2014-10-14 LAB — PREPARE RBC (CROSSMATCH)

## 2014-10-14 LAB — PRO B NATRIURETIC PEPTIDE: PRO B NATRI PEPTIDE: 936.4 pg/mL — AB (ref 0–450)

## 2014-10-14 MED ORDER — HYDROCODONE-ACETAMINOPHEN 10-325 MG PO TABS
1.0000 | ORAL_TABLET | Freq: Four times a day (QID) | ORAL | Status: DC | PRN
  Administered 2014-10-14 – 2014-10-17 (×6): 1 via ORAL
  Filled 2014-10-14 (×6): qty 1

## 2014-10-14 MED ORDER — ONDANSETRON HCL 4 MG PO TABS: 4.0000 mg | ORAL_TABLET | Freq: Four times a day (QID) | ORAL | Status: DC | PRN

## 2014-10-14 MED ORDER — PANTOPRAZOLE SODIUM 40 MG PO TBEC
40.0000 mg | DELAYED_RELEASE_TABLET | Freq: Every day | ORAL | Status: DC
  Administered 2014-10-15 – 2014-10-16 (×2): 40 mg via ORAL
  Filled 2014-10-14 (×5): qty 1

## 2014-10-14 MED ORDER — TAMSULOSIN HCL 0.4 MG PO CAPS
0.4000 mg | ORAL_CAPSULE | Freq: Every day | ORAL | Status: DC
  Administered 2014-10-15 – 2014-10-17 (×3): 0.4 mg via ORAL
  Filled 2014-10-14 (×4): qty 1

## 2014-10-14 MED ORDER — ACETAMINOPHEN 650 MG RE SUPP: 650.0000 mg | Freq: Four times a day (QID) | RECTAL | Status: DC | PRN

## 2014-10-14 MED ORDER — INSULIN ASPART 100 UNIT/ML ~~LOC~~ SOLN
0.0000 [IU] | Freq: Three times a day (TID) | SUBCUTANEOUS | Status: DC
  Administered 2014-10-15: 1 [IU] via SUBCUTANEOUS
  Administered 2014-10-16: 2 [IU] via SUBCUTANEOUS
  Administered 2014-10-16: 1 [IU] via SUBCUTANEOUS

## 2014-10-14 MED ORDER — SODIUM CHLORIDE 0.9 % IV SOLN
INTRAVENOUS | Status: DC
  Administered 2014-10-14: 18:00:00 via INTRAVENOUS
  Administered 2014-10-15: 500 mL via INTRAVENOUS

## 2014-10-14 MED ORDER — MIRABEGRON ER 50 MG PO TB24
50.0000 mg | ORAL_TABLET | Freq: Every day | ORAL | Status: DC
  Administered 2014-10-15 – 2014-10-17 (×3): 50 mg via ORAL
  Filled 2014-10-14 (×4): qty 1

## 2014-10-14 MED ORDER — ALPRAZOLAM 0.25 MG PO TABS
0.2500 mg | ORAL_TABLET | Freq: Four times a day (QID) | ORAL | Status: DC | PRN
  Administered 2014-10-14 – 2014-10-15 (×2): 0.25 mg via ORAL
  Filled 2014-10-14 (×2): qty 1

## 2014-10-14 MED ORDER — SODIUM CHLORIDE 0.9 % IJ SOLN: 3.0000 mL | INTRAMUSCULAR | Status: DC | PRN

## 2014-10-14 MED ORDER — SODIUM CHLORIDE 0.9 % IJ SOLN
3.0000 mL | Freq: Two times a day (BID) | INTRAMUSCULAR | Status: DC
  Administered 2014-10-15: 3 mL via INTRAVENOUS

## 2014-10-14 MED ORDER — SODIUM CHLORIDE 0.9 % IV SOLN: 250.0000 mL | INTRAVENOUS | Status: DC | PRN

## 2014-10-14 MED ORDER — ONDANSETRON HCL 4 MG/2ML IJ SOLN: 4.0000 mg | Freq: Four times a day (QID) | INTRAMUSCULAR | Status: DC | PRN

## 2014-10-14 MED ORDER — SODIUM CHLORIDE 0.9 % IJ SOLN
3.0000 mL | Freq: Two times a day (BID) | INTRAMUSCULAR | Status: DC
  Administered 2014-10-15 – 2014-10-17 (×5): 3 mL via INTRAVENOUS

## 2014-10-14 MED ORDER — DUTASTERIDE 0.5 MG PO CAPS
0.5000 mg | ORAL_CAPSULE | Freq: Every day | ORAL | Status: DC
  Administered 2014-10-14 – 2014-10-17 (×4): 0.5 mg via ORAL
  Filled 2014-10-14 (×4): qty 1

## 2014-10-14 MED ORDER — SODIUM CHLORIDE 0.9 % IV SOLN
Freq: Once | INTRAVENOUS | Status: DC
Start: 2014-10-14 — End: 2014-10-17

## 2014-10-14 MED ORDER — ACETAMINOPHEN 325 MG PO TABS
650.0000 mg | ORAL_TABLET | Freq: Four times a day (QID) | ORAL | Status: DC | PRN
Start: 2014-10-14 — End: 2014-10-17
  Filled 2014-10-14: qty 2

## 2014-10-14 NOTE — Telephone Encounter (Signed)
Message copied by Ardeen Garland on Mon Oct 14, 2014  8:55 AM ------      Message from: Curt Bears      Created: Fri Oct 11, 2014  4:58 PM       Call patient with the result and we need to refer to GI. Please find out if he has a Gi physician ------

## 2014-10-14 NOTE — Consult Note (Signed)
CARDIOLOGY CONSULT NOTE   Patient ID: Stanley [name] MRN: [id number], DOB/AGE: January 01, 1925   Admit date: 10/14/2014 Date of Consult: 10/14/2014   Primary Physician: [name], MD Primary Cardiologist: Dr. Aundra Dubin  Pt. 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 Diabetes mellitus   . BPH (benign prostatic hyperplasia)   . Arthritis   . GERD (gastroesophageal reflux disease)   . Neuromuscular disorder   . Hypertension     no meds  . Shortness of breath     uses cpap  . 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 Failed total knee, right   . H pylori ulcer     Gastric ulcer    Past Surgical History  Procedure Laterality Date  . Aortic valve replacement    . Foot arthrotomy Right   . Tonsillectomy    . Cataract extraction, bilateral    . Humerus fracture surgery    . Replacement total knee Bilateral   . Toe amputation      rt 2nd  . Cardiac catheterization    . Amputation  01/19/2012    Procedure: AMPUTATION DIGIT;  Surgeon: Colin Rhein, MD;  Location: [facility];  Service: Orthopedics;  Laterality: Right;  right great toe amputation through MTP joint  . Rotator cuff repair Right      Allergies  Allergies  Allergen Reactions  . Morphine And Related Nausea And Vomiting  . Succinylcholine Other (See Comments)    unknown  . 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 Patient has been following with Dr. Aundra Dubin. His last echocardiogram on 10/26/2013 showed EF 33-82%, grade 1 diastolic dysfunction, well-seated bioprosthetic aortic valve, mild MR, moderately reduced RV EF. Due to his borderline blood pressure, he was placed on low-dose losartan 12.5 mg for cardiomyopathy. During his last visit earlier this year, his losartan was increased to 25 mg. Since then, patient has been tolerating his blood pressure medication well. Per Dr. Claris Gladden note, he has severe unrevascularizeble bilateral tibial disease on cath 02/2012.  Earlier this year, patient also has dental procedure done, and has been having trouble eating solid food. He denies any exertional chest discomfort. Due to his diabetic neuropathy, patient has to walk around with a walker. However he can walk his dog 2 blocks away from the house without significant discomfort. He also ride exercise bicycles almost on a daily basis. For the past month, patient has experienced worsening fatigue, dizziness, shortness breath with exertion. However no LE edema, orthopnea or PND. During a routine follow-up with his PCP Dr. Sharlene Motts, patient was noted to have anemia with hemoglobin around 9. He was referred to Dr. Julien Nordmann for IV iron therapy and received his first dose last week. He was seen back in his PCPs office this morning, however due  to significant symptom and anemia, he was admitted to GI service from office for further evaluation.   Per previous GI note, he had EGD in January 2014 showed 2 nonbleeding ulcer in pylorus and gastritis, biopsy positive for H. pylori. Repeat EGD in April 2014 after treatment appeared to be normal. Last colonoscopy was in May 2008 which showed internal and external hemorrhoids. Cardiology has been  consulted.   Inpatient Medications  . sodium chloride   Intravenous Once  . dutasteride  0.5 mg Oral Daily  . insulin aspart  0-9 Units Subcutaneous TID WC  . mirabegron ER  50 mg Oral Daily  . [START ON 10/15/2014] pantoprazole  40 mg Oral Q0600  . sodium chloride  3 mL Intravenous Q12H  . sodium chloride  3 mL Intravenous Q12H  . tamsulosin  0.4 mg Oral Daily    Family History Family History  Problem Relation Age of Onset  . Diabetes type II Mother   . Diabetes type II Father   . Colon cancer Neg Hx   . Esophageal cancer Neg Hx   . Rectal cancer Neg Hx   . Stomach cancer Neg Hx      Social History History   Social History  . Marital Status: Married    Spouse Name: N/A    Number of Children: N/A  . Years of Education: N/A   Occupational History  . Not on file.   Social History Main Topics  . Smoking status: Never Smoker   . Smokeless tobacco: Never Used  . Alcohol Use: 4.2 oz/week    7 Glasses of wine per week  . Drug Use: No  . Sexual Activity: Not on file   Other Topics Concern  . Not on file   Social History Narrative   Married, retired primary care MD    + children           Review of Systems  General:  No chills, fever, night sweats or weight changes.  Cardiovascular:  No chest pain, edema, orthopnea, palpitations, paroxysmal nocturnal dyspnea.  +dyspnea on exertion Dermatological: No rash, lesions/masses Respiratory: No cough, dyspnea Urologic: No hematuria, dysuria Abdominal:   No nausea, vomiting, diarrhea, bright red blood per rectum, melena, or hematemesis Neurologic:  No visual changes, changes in mental status. weakness All other systems reviewed and are otherwise negative except as noted above.  Physical Exam  Blood pressure 114/51, pulse 66, temperature 98.1 F (36.7 C), temperature source Oral, resp. rate 20, SpO2 100.00%.  General: Pleasant, NAD Psych: Normal affect. Neuro: Alert and oriented X 3. Moves all extremities  spontaneously. HEENT: Normal  Neck: Supple without bruits or JVD. Lungs:  Resp regular and unlabored, CTA. Heart: RRR no s3, s4, or murmurs. Abdomen: Soft, non-tender, non-distended, BS + x 4.  Extremities: No clubbing, cyanosis or edema. DP/PT/Radials 2+ and equal bilaterally.  Labs  No results found for this basename: CKTOTAL, CKMB, TROPONINI,  in the last 72 hours Lab Results  Component Value Date   WBC 7.4 10/14/2014   HGB 9.0* 10/14/2014   HCT 27.3* 10/14/2014   MCV 82.2 10/14/2014   PLT 224 10/14/2014     Recent Labs Lab 10/09/14 1123  NA 130*  K 5.3*  CO2 21*  BUN 31.2*  CREATININE 1.4*  CALCIUM 9.3  PROT 6.3*  BILITOT 0.22  ALKPHOS 70  ALT 15  AST 17  GLUCOSE 162*   Lab Results  Component Value Date   CHOL 138 10/02/2014   HDL 39.20  10/02/2014   LDLCALC 67 10/02/2014   TRIG 159.0* 10/02/2014   No results found for this basename: DDIMER    Radiology/Studies  X-ray Chest Pa And Lateral  10/14/2014   CLINICAL DATA:  GI bleed.  Shortness of breath.  EXAM: CHEST  2 VIEW  COMPARISON:  05/14/2014.  FINDINGS: Trachea is midline. Heart size stable. Probable mild scarring at the lung bases. Lungs are otherwise clear. No pleural fluid.  Multiple old left rib fractures are seen with advanced degenerative change in the left shoulder.  IMPRESSION: No acute findings.   Electronically Signed   By: Lorin Picket M.D.   On: 10/14/2014 15:27    ECG  Pending ekg  ASSESSMENT AND PLAN  1. Fatigue/Weakness and DOE  - likely related to anemia. No obvious sign of ischemic disease or CHF  - will continue to follow with GI workup result, pending EGD tomorrow  2. Anemia:  - maybe combination of low iron diet and GI blood loss  - positive hemacult x 3  3. coronary artery disease status post two-vessel CABG/AVR in 8/11 (LIMA to LAD and SVG to PDA)  4. ischemic cardiomyopathy with baseline EF 35-40%  - proBNP mildly elevated, however no sign of HF on exam. Will  monitor  5. Diabetes 6. peripheral arterial disease 7. ?h/o orthostatic hypotension (patient denies) 8. obstructive sleep apnea: per recent note, CPAP machine need to be replaced   Signed, Almyra Deforest, PA-C 10/14/2014, 5:07 PM  Patient seen and interviewed and personally examined by myself.  Agree with note as outlined by Almyra Deforest, PA.  Admitted with increased SOB over the past few weeks and declining Hg with heme positive stools.  No overt signs of CHF.  No chest pain.  SOB most likely related to underlying anemia.  Signed: Fransico Him, MD [facility] 10/14/2014

## 2014-10-14 NOTE — Telephone Encounter (Signed)
, °

## 2014-10-14 NOTE — Telephone Encounter (Signed)
Pt notified of hemoccult results. Order sent to refer to Dr Fuller Plan. Pt stated he stopped his diclofenac and aspirin for only 3 days.

## 2014-10-14 NOTE — H&P (Signed)
Primary Care Physician:  [name], MD Primary Gastroenterologist:  Dr. Fuller Plan  HPI: Stanley [name] is a 80 y.o. male with PMH of DM with neuropathy, BPH, arthritis, OSA, PAD with toe amputation, CAD with CABG and bioprosthetic AVR in 2011, and ischemic cardiomyopathy with EF of 35-40% in 10/2013.  He is being directly admitted to [facility] hospital today from PCP's office with complaints of SOB/DOE, heme positive stool, and decreasing Hgb over the past couple of weeks.  He had been seeing Dr. Earlie Server for IDA and Hgb was 11.6 grams just 11 months ago.  One week ago Hgb was 9.6 grams.  He received iron infusion last week and Hgb today was 8.5 grams in PCP's office.  He has been feeling very weak and unable to walk due to SOB and weakness; had to use a wheelchair today.  He had 3 Hemoccult last week, which were all positive.  He denies any dark or bloody stools.  Appetite has been poor the past couple of weeks as well.  No other GI complaints.   EGD by Dr. Fuller Plan in 12/2012 showed 2 non-bleeding ulcers in the pylorus and gastritis; biopsies showed ulcerated adn inflamed mucosa positive for Hpylori.  He was treated with Pylera for 10 days.  Repeat EGD 03/2013 appeared normal.  Last colonoscopy was in 04/2007 by Dr. Velora Heckler at which time he had internal and external hemorrhoids.  Does take some diclofenac for arthritis.  Is on daily PPI at home.   Past Medical History  Diagnosis Date  . Diabetes mellitus   . BPH (benign prostatic hyperplasia)   . Arthritis   . GERD (gastroesophageal reflux disease)   . Neuromuscular disorder   . Hypertension     no meds  . Shortness of breath     uses cpap  . 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 Failed total knee, right   . H pylori ulcer     Gastric ulcer    Past Surgical History  Procedure Laterality Date  . Aortic valve replacement    . Foot arthrotomy Right   .  Tonsillectomy    . Cataract extraction, bilateral    . Humerus fracture surgery    . Replacement total knee Bilateral   . Toe amputation      rt 2nd  . Cardiac catheterization    . Amputation  01/19/2012    Procedure: AMPUTATION DIGIT;  Surgeon: Colin Rhein, MD;  Location: [place];  Service: Orthopedics;  Laterality: Right;  right great toe amputation through MTP joint  . Rotator cuff repair Right     Prior to Admission medications   Medication Sig Start Date End Date Taking? Authorizing Provider  ALPRAZolam (XANAX) 0.25 MG tablet Take 1 tablet (0.25 mg total) by mouth every 6 (six) hours as needed. 07/30/14   [name], MD  aspirin 81 MG tablet Take 81 mg by mouth daily.    Historical Provider, MD  Calcium Carbonate-Vitamin D (CALCIUM-VITAMIN D) 500-200 MG-UNIT per tablet Take 1 tablet by mouth 2 (two) times daily.     Historical Provider, MD  clotrimazole-betamethasone (LOTRISONE) cream  01/20/13   Historical Provider, MD  diclofenac (VOLTAREN) 75 MG EC tablet take 1 tablet by mouth twice a day 10/09/14   [name], MD  diphenoxylate-atropine (LOMOTIL) 2.5-0.025 MG per tablet TAKE 1 TABLET 4 TIMES DAILY AS NEEDED FOR DIARRHEA OR LOOSE STOOLS. 04/16/14  [name], MD  dutasteride (AVODART) 0.5 MG capsule Take 1 capsule (0.5 mg total) by mouth daily. 10/02/14   [name], MD  esomeprazole (NEXIUM) 40 MG capsule Take 1 capsule (40 mg total) by mouth daily. 10/02/14   [name], MD  FeFum-FePoly-FA-B Cmp-C-Biot (INTEGRA PLUS) CAPS Take 1 capsule by mouth every morning. 10/09/14   Curt Bears, MD  glucose blood (ONE TOUCH TEST STRIPS) test strip Dispense one touch ultra, test once per day and diagnosis code is 250.00 07/23/13   [name], MD  HYDROcodone-acetaminophen (NORCO) 10-325 MG per tablet Take 1 tablet by mouth every 6 (six) hours as needed for severe pain. 10/02/14   [name], MD  iron polysaccharides (FERREX 150) 150 MG capsule Take 1  capsule (150 mg total) by mouth 2 (two) times daily. 10/08/14   [name], MD  losartan (COZAAR) 25 MG tablet Take 1 tablet (25 mg total) by mouth daily. 11/21/13   Larey Dresser, MD  mirabegron ER (MYRBETRIQ) 25 MG TB24 tablet Take 1 tablet (25 mg total) by mouth daily. 10/02/14   [name], MD  Multiple Vitamin (MULITIVITAMIN WITH MINERALS) TABS Take 1 tablet by mouth daily.    Historical Provider, MD  pioglitazone (ACTOS) 30 MG tablet take 1 tablet by mouth once daily 10/02/14   [name], MD  prochlorperazine (COMPAZINE) 10 MG tablet take 1 tablet by mouth four times a day for nausea 07/04/13   [name], MD  rosuvastatin (CRESTOR) 20 MG tablet Take 10 mg by mouth daily.  11/21/13   Larey Dresser, MD  sitaGLIPtin-metformin (JANUMET) 50-1000 MG per tablet take 1 tablet by mouth twice a day WITH A MEAL. 10/02/14   [name], MD  tamsulosin [facility]) 0.4 MG CAPS capsule  09/04/14   Historical Provider, MD    Current Facility-Administered Medications  Medication Dose Route Frequency Provider Last Rate Last Dose  . 0.9 %  sodium chloride infusion  250 mL Intravenous PRN Jessica D. Zehr, PA-C      . 0.9 %  sodium chloride infusion   Intravenous Once Jessica D. Zehr, PA-C      . acetaminophen (TYLENOL) tablet 650 mg  650 mg Oral Q6H PRN Laban Emperor. Zehr, PA-C       Or  . acetaminophen (TYLENOL) suppository 650 mg  650 mg Rectal Q6H PRN Laban Emperor. Zehr, PA-C      . ALPRAZolam (XANAX) tablet 0.25 mg  0.25 mg Oral Q6H PRN Laban Emperor. Zehr, PA-C      . dutasteride (AVODART) capsule 0.5 mg  0.5 mg Oral Daily Jessica D. Zehr, PA-C      . HYDROcodone-acetaminophen (NORCO) 10-325 MG per tablet 1 tablet  1 tablet Oral Q6H PRN Laban Emperor. Zehr, PA-C      . insulin aspart (novoLOG) injection 0-9 Units  0-9 Units Subcutaneous TID WC Jessica D. Zehr, PA-C      . mirabegron ER (MYRBETRIQ) tablet 25 mg  25 mg Oral Daily Jessica D. Zehr, PA-C      . ondansetron (ZOFRAN) tablet 4 mg  4 mg Oral Q6H  PRN Laban Emperor. Zehr, PA-C       Or  . ondansetron (ZOFRAN) injection 4 mg  4 mg Intravenous Q6H PRN Jessica D. Zehr, PA-C      . Derrill Memo ON 10/15/2014] pantoprazole (PROTONIX) EC tablet 40 mg  40 mg Oral Q0600 Jessica D. Zehr, PA-C      .  sodium chloride 0.9 % injection 3 mL  3 mL Intravenous Q12H Jessica D. Zehr, PA-C      . sodium chloride 0.9 % injection 3 mL  3 mL Intravenous Q12H Jessica D. Zehr, PA-C      . sodium chloride 0.9 % injection 3 mL  3 mL Intravenous PRN Jessica D. Zehr, PA-C      . tamsulosin (FLOMAX) capsule 0.4 mg  0.4 mg Oral Daily Jessica D. Zehr, PA-C        Allergies as of 10/14/2014 - Review Complete 10/14/2014  Allergen Reaction Noted  . Morphine and related Nausea And Vomiting 10/27/2011  . Succinylcholine Other (See Comments) 01/18/2012  . Ace inhibitors Other (See Comments)     Family History  Problem Relation Age of Onset  . Diabetes type II Mother   . Diabetes type II Father   . Colon cancer Neg Hx   . Esophageal cancer Neg Hx   . Rectal cancer Neg Hx   . Stomach cancer Neg Hx     History   Social History  . Marital Status: Married    Spouse Name: Collie Siad       . Years of Education: MD         Social History Main Topics  . Smoking status: Never Smoker   . Smokeless tobacco: Never Used  . Alcohol Use: 4.2 oz/week    7 Glasses of wine per week  . Drug Use: No    Social History Narrative   Married, retired primary care MD    + children          Review of Systems: + chronic back and joint pain, chronic BPH sxs - txed, chronic balance problems, neuropathy, uses a walker to ambulate. All other ROS negative.  Physical Exam: Vital signs in last 24 hours: Temp:  [98.1 F (36.7 C)] 98.1 F (36.7 C) (10/26 1419) Pulse Rate:  [66] 66 (10/26 1419) Resp:  [20] 20 (10/26 1419) BP: (114)/(51) 114/51 mmHg (10/26 1419) SpO2:  [100 %] 100 % (10/26 1419)   General:  Alert, Well-developed, well-nourished, pleasant and cooperative in NAD Head:   Normocephalic and atraumatic. Eyes:  Sclera clear, no icterus.  Conjunctiva pale. Ears:  Normal auditory acuity. Mouth:  Pale mucosa, + dental implants Lungs:  Clear throughout to auscultation.  No wheezes, crackles, or rhonchi.  Heart:  Regular rate and rhythm; no murmurs, clicks, rubs, or gallops. No JVD. Abdomen:  Soft, nontender, BS active, nonpalp mass or hsm.   Msk:  OA changes of fingers Extremities:  Without clubbing or edema. + knee surgery changes bilateral Neurologic:  Alert and  oriented x4;  grossly normal neurologically. Skin:  Intact without significant lesions or rashes.  Pale. Psych:  Alert and cooperative. Normal mood and affect.  IMPRESSION:  -80 year old male admitted for symptomatic anemia with DOE and heme positive stools.  Rule out AVM's vs recurrent ulcer disease, etc. Is on a PPI and on NSAID's -History of bioprosthetic AVR and CABG in 2011 -Ischemic cardiomyopathy with ERF 35-40% in 10/2013 -DM with neuropathy -BPH -PAD  PLAN: -Admit to telemetry.  Will check 2 view chest x-ray, 2D echo, EKG, and cardiology consult. -Check CBC and CMP. BNP -Transfuse with one unit PRBC's. -EGD/enteroscopy 10/27.  Patient seen with Alonza Bogus PA-C and she served as a Education administrator.  ? If more than anemia driving some of this. Not sure why he has a relative bradycardia. Does he have RHF?  The risks and  benefits as well as alternatives of endoscopic procedure(s) have been discussed and reviewed. All questions answered. The patient agrees to proceed.   Silvano Rusk  10/14/2014, 3:08 PM  Pager number [phone]

## 2014-10-14 NOTE — Telephone Encounter (Signed)
I spoke with Stanley [name] and also per Dr. Sarajane Jews, order a POC Hemoglobin. Stanley [name] will stop by today and get this done.

## 2014-10-14 NOTE — Telephone Encounter (Signed)
I agree with holding the Losartan. How is he doing this week?

## 2014-10-14 NOTE — Telephone Encounter (Signed)
Here with his wife to check a Hgb today. He has been seeing Dr. Julien Nordmann for iron deficiency anemia, and his Hgb was 9.6 about a week ago. He received an iron infusion last week and his Hgb that day was 9.0. He has been feeling very weak and is unable to walk at all due to SOB and weakness. He is here in a wheelchair today. At the request of Dr. Julien Nordmann he did 3 Hemocult checks at home this past weekend and all three were positive. He has not seen any blood in his stools. His Hgb today is 8.5. His BP here is 112/65 and his pulse is 68. I spoke to Dr. Silvano Rusk of GI about the situation and we agreed he needs to be admitted. The patient's wife will drive him to [facility] now for a direct admission to the GI service.

## 2014-10-15 ENCOUNTER — Encounter (HOSPITAL_COMMUNITY)
Admission: AD | Disposition: A | Discharge status: Home / Self Care | Payer: Self-pay | Source: Ambulatory Visit | Attending: Internal Medicine

## 2014-10-15 ENCOUNTER — Encounter (HOSPITAL_COMMUNITY): Payer: Self-pay | Admitting: *Deleted

## 2014-10-15 DIAGNOSIS — I517 Cardiomegaly: Secondary | ICD-10-CM

## 2014-10-15 DIAGNOSIS — I25700 Atherosclerosis of coronary artery bypass graft(s), unspecified, with unstable angina pectoris: Secondary | ICD-10-CM

## 2014-10-15 DIAGNOSIS — R195 Other fecal abnormalities: Secondary | ICD-10-CM

## 2014-10-15 HISTORY — PX: ENTEROSCOPY: SHX5533

## 2014-10-15 LAB — GLUCOSE, CAPILLARY
GLUCOSE-CAPILLARY: 137 mg/dL — AB (ref 70–99)
Glucose-Capillary: 111 mg/dL — ABNORMAL HIGH (ref 70–99)
Glucose-Capillary: 117 mg/dL — ABNORMAL HIGH (ref 70–99)
Glucose-Capillary: 209 mg/dL — ABNORMAL HIGH (ref 70–99)

## 2014-10-15 LAB — CBC
HEMATOCRIT: 33.4 % — AB (ref 39.0–52.0)
Hemoglobin: 10.9 g/dL — ABNORMAL LOW (ref 13.0–17.0)
MCH: 26.6 pg (ref 26.0–34.0)
MCHC: 32.6 g/dL (ref 30.0–36.0)
MCV: 81.5 fL (ref 78.0–100.0)
Platelets: 247 10*3/uL (ref 150–400)
RBC: 4.1 MIL/uL — ABNORMAL LOW (ref 4.22–5.81)
RDW: 15.8 % — ABNORMAL HIGH (ref 11.5–15.5)
WBC: 7.1 10*3/uL (ref 4.0–10.5)

## 2014-10-15 LAB — BASIC METABOLIC PANEL
Anion gap: 15 (ref 5–15)
BUN: 21 mg/dL (ref 6–23)
CO2: 21 mEq/L (ref 19–32)
Calcium: 9.2 mg/dL (ref 8.4–10.5)
Chloride: 95 mEq/L — ABNORMAL LOW (ref 96–112)
Creatinine, Ser: 0.83 mg/dL (ref 0.50–1.35)
GFR, EST AFRICAN AMERICAN: 90 mL/min — AB (ref >90)
GFR, EST NON AFRICAN AMERICAN: 77 mL/min — AB (ref >90)
Glucose, Bld: 181 mg/dL — ABNORMAL HIGH (ref 70–99)
POTASSIUM: 4.7 meq/L (ref 3.7–5.3)
SODIUM: 131 meq/L — AB (ref 137–147)

## 2014-10-15 LAB — TYPE AND SCREEN
ABO/RH(D): O POS
ANTIBODY SCREEN: NEGATIVE
UNIT DIVISION: 0

## 2014-10-15 SURGERY — ENTEROSCOPY
Anesthesia: Moderate Sedation

## 2014-10-15 MED ORDER — FENTANYL CITRATE 0.05 MG/ML IJ SOLN
INTRAMUSCULAR | Status: AC
  Filled 2014-10-15: qty 2

## 2014-10-15 MED ORDER — FENTANYL CITRATE 0.05 MG/ML IJ SOLN
INTRAMUSCULAR | Status: DC | PRN
  Administered 2014-10-15: 25 ug via INTRAVENOUS

## 2014-10-15 MED ORDER — MIDAZOLAM HCL 10 MG/2ML IJ SOLN
INTRAMUSCULAR | Status: DC | PRN
  Administered 2014-10-15: 2 mg via INTRAVENOUS

## 2014-10-15 MED ORDER — MIDAZOLAM HCL 10 MG/2ML IJ SOLN
INTRAMUSCULAR | Status: AC
  Filled 2014-10-15: qty 2

## 2014-10-15 NOTE — Clinical Documentation Improvement (Signed)
Please specify diagnosis related to below supporting information if appropriate:   Possible Clinical Conditions  Acute Blood Loss Anemia Precipitous drop in Hematocrit Acute on chronic blood loss anemia Other Condition Cannot Clinically Determine    Supporting Information   Per 10/15/14 MD progress notes:  Dyspnea etiology unclear. Question contribution from anemia.  Acute on chronic anemia-further evaluation per gastroenterology. Endoscopy pending.     Patient's Hgb improved by 2 grams after &transfusion one unit PRBC's. Seen by cardiology; awaiting ECHO. Has heart block on tele strips. K+ has normalized overnight. Sodium still a little low and has not been getting any IVF's. For EGD/enteroscopy this afternoon for evaluation of anemia/heme positive stools.    Pt's labs during this admission  Component     Latest Ref Rng 10/14/2014         2:56 PM  Hemoglobin     13.0 - 17.0 g/dL 9.0 (L)  HCT     39.0 - 52.0 % 27.3 (L)    Component     Latest Ref Rng 10/15/2014         9:25 AM  Hemoglobin     13.0 - 17.0 g/dL 10.9 (L)  HCT     39.0 - 52.0 % 33.4 (L)   Thanks, Rozetta Nunnery, BSN, CCM Clinical Documentation Specialist Phone: [phone]

## 2014-10-15 NOTE — Progress Notes (Signed)
Patient's Hgb improved by 2 grams after transfusion one unit PRBC's.  Seen by cardiology; awaiting ECHO.  Has heart block on tele strips.  K+ has normalized overnight.  Sodium still a little low and has not been getting any IVF's.  For EGD/enteroscopy this afternoon for evaluation of anemia/heme positive stools.  Rule out small bowel AVM's, etc.

## 2014-10-15 NOTE — Op Note (Signed)
[facility] [place], [address]   ENTEROSCOPY PROCEDURE REPORT     EXAM DATE: 10/15/2014  PATIENT NAME:      Stanley [name], Stanley [name]           MR #:      [id number]  BIRTHDATE:       April 01, 1925      VISIT #:     [phone]  ATTENDING:     Gatha Mayer, MD, Marval Regal     STATUS:     outpatient  ASSISTANT:      Jiles Harold and Hilma Favors  ASA CLASS:        Class III  INDICATIONS:  The patient is a 80 yr old male here for an enteroscopy procedure due to hemoccult positive stools and iron deficiency anemia. PROCEDURE PERFORMED:     Diagnostic small bowel enteroscopy  MEDICATIONS:     Fentanyl 25 mcg IV and Versed 2 mg IV  CONSENT: The patient understands the risks and benefits of the procedure and understands that these risks include, but are not limited to: sedation, allergic reaction, infection, perforation and/or bleeding. Alternative means of evaluation and treatment include, among others: physical exam, x-rays, and/or surgical intervention. The patient elects to proceed with this endoscopic procedure.  DESCRIPTION OF PROCEDURE: During intra-op preparation period all mechanical & medical equipment was checked for proper function. Hand hygiene and appropriate measures for infection prevention was taken. After the risks, benefits and alternatives of the procedure were thoroughly explained, Informed consent was verified, confirmed and timeout was successfully executed by the treatment team. The Pentax VSB-2900 endoscope was introduced through the mouth and advanced to the proximal jejunum . The prep was The overall prep quality was excellent.. The instrument was then slowly withdrawn while examining the mucosa circumferentially. The scope was then completely withdrawn from the patient and the procedure terminated. The pulse, BP, and O2 saturation were monitored and documented by the physician and the nursing staff throughout the  entire procedure.  The patient was cared for as planned according to standard protocol, then discharged to recovery in stable condition and with appropriate post procedure care.  Normal esophagus, stomach, duodenum and very proximal jejunum.     ADVERSE EVENTS:      There were no immediate complications.  IMPRESSIONS:     Normal esophagus, stomach, duodenum and very proximal jejunum  RECOMMENDATIONS:     Colonoscopy will be needed - probably 10/29. Cardiology to assess bradycardia further first    Gatha Mayer, MD, [facility] eSigned:  Gatha Mayer, MD, [facility] 10/15/2014 2:43 PM   cc:  Gordan Payment, MD

## 2014-10-15 NOTE — Progress Notes (Signed)
    Subjective:  Denies CP or dyspnea   Objective:  Filed Vitals:   10/14/14 1754 10/14/14 1829 10/14/14 1900 10/14/14 2010  BP: 123/54 120/47  134/49  Pulse: 53 52  90  Temp: 97.7 F (36.5 C) 98 F (36.7 C)  98.3 F (36.8 C)  TempSrc: Oral Oral  Oral  Resp: 18 18  18   Height:   5\' 6"  (1.676 m)   Weight:   186 lb 4.6 oz (84.5 kg)   SpO2: 99%   99%    Intake/Output from previous day:  Intake/Output Summary (Last 24 hours) at 10/15/14 0602 Last data filed at 10/15/14 0400  Gross per 24 hour  Intake    600 ml  Output   2150 ml  Net  -1550 ml    Physical Exam: Physical exam: Well-developed well-nourished in no acute distress.  Skin is warm and dry.  HEENT is normal.  Neck is supple.  Chest is clear to auscultation with normal expansion.  Cardiovascular exam is regular rate and rhythm. 2/6 systolic murmur LSB Abdominal exam nontender or distended. No masses palpated. Extremities show no edema. neuro grossly intact    Lab Results: Basic Metabolic Panel:  Recent Labs  10/14/14 1456  NA 130*  K 5.9*  CL 96  CO2 21  GLUCOSE 128*  BUN 36*  CREATININE 1.29  CALCIUM 8.9   CBC:  Recent Labs  10/14/14 1456  WBC 7.4  HGB 9.0*  HCT 27.3*  MCV 82.2  PLT 224     Assessment/Plan:  80 year old male with past medical history of diabetes mellitus, coronary artery disease status post coronary artery bypass and graft, ischemic cardiomyopathy, aortic valve replacement, peptic ulcer disease admitted with progressive weakness and dyspnea on exertion. He has been found to be anemic with pending endoscopy. Cardiology was asked to evaluate for cardiac contribution.  1 dyspnea etiology unclear. Question contribution from anemia. Endoscopy is scheduled. We will plan an echocardiogram to further assess LV function and aortic valve replacement.Bradycardia may also be contributing. In reviewing telemetry the patient is noted to have first-degree AV block, second-degree AV  block type I with occasional junctional escape beats. Heart rate in the 30s predominantly with sleeping. Follow telemetry with further recommendations based on results. 2 Acute on chronic anemia-further evaluation per gastroenterology. Endoscopy pending. 3 history of aortic valve replacement-await follow-up echocardiogram. 4 bradycardia-follow telemetry. If patient demonstrated to have symptomatic bradycardia did require pacemaker. Not clear that all of his symptoms are related to heart rate. 5 coronary artery disease-Aspirin on hold. Resume statin at discharge. 6 ischemic cardiomyopathy-patient does not appear to be volume overloaded on examination. ARB on hold. No beta blocker given bradycardia.  Kirk Ruths 10/15/2014, 6:02 AM

## 2014-10-15 NOTE — Progress Notes (Signed)
[place]

## 2014-10-15 NOTE — Progress Notes (Signed)
Echocardiogram 2D Echocardiogram has been performed.  Ted Goodner 10/15/2014, 10:10 AM

## 2014-10-16 ENCOUNTER — Ambulatory Visit: Payer: Self-pay | Admitting: Gastroenterology

## 2014-10-16 ENCOUNTER — Encounter (HOSPITAL_COMMUNITY): Payer: Self-pay | Admitting: Internal Medicine

## 2014-10-16 DIAGNOSIS — I441 Atrioventricular block, second degree: Secondary | ICD-10-CM | POA: Diagnosis present

## 2014-10-16 LAB — CBC
HEMATOCRIT: 34.4 % — AB (ref 39.0–52.0)
HEMOGLOBIN: 11.1 g/dL — AB (ref 13.0–17.0)
MCH: 26.6 pg (ref 26.0–34.0)
MCHC: 32.3 g/dL (ref 30.0–36.0)
MCV: 82.3 fL (ref 78.0–100.0)
Platelets: 247 10*3/uL (ref 150–400)
RBC: 4.18 MIL/uL — ABNORMAL LOW (ref 4.22–5.81)
RDW: 16 % — ABNORMAL HIGH (ref 11.5–15.5)
WBC: 6.4 10*3/uL (ref 4.0–10.5)

## 2014-10-16 LAB — BASIC METABOLIC PANEL
Anion gap: 13 (ref 5–15)
BUN: 18 mg/dL (ref 6–23)
CHLORIDE: 100 meq/L (ref 96–112)
CO2: 22 meq/L (ref 19–32)
CREATININE: 0.78 mg/dL (ref 0.50–1.35)
Calcium: 9.3 mg/dL (ref 8.4–10.5)
GFR calc non Af Amer: 79 mL/min — ABNORMAL LOW (ref >90)
GLUCOSE: 109 mg/dL — AB (ref 70–99)
Potassium: 4.8 mEq/L (ref 3.7–5.3)
Sodium: 135 mEq/L — ABNORMAL LOW (ref 137–147)

## 2014-10-16 LAB — GLUCOSE, CAPILLARY
GLUCOSE-CAPILLARY: 148 mg/dL — AB (ref 70–99)
GLUCOSE-CAPILLARY: 109 mg/dL — AB (ref 70–99)
Glucose-Capillary: 115 mg/dL — ABNORMAL HIGH (ref 70–99)
Glucose-Capillary: 185 mg/dL — ABNORMAL HIGH (ref 70–99)

## 2014-10-16 MED ORDER — ROSUVASTATIN CALCIUM 20 MG PO TABS
20.0000 mg | ORAL_TABLET | Freq: Every day | ORAL | Status: DC
  Administered 2014-10-16: 20 mg via ORAL
  Filled 2014-10-16 (×2): qty 1

## 2014-10-16 MED ORDER — PEG-KCL-NACL-NASULF-NA ASC-C 100 G PO SOLR
0.5000 | Freq: Once | ORAL | Status: AC
  Administered 2014-10-16: 100 g via ORAL

## 2014-10-16 MED ORDER — PEG-KCL-NACL-NASULF-NA ASC-C 100 G PO SOLR: 1.0000 | Freq: Once | ORAL | Status: DC

## 2014-10-16 MED ORDER — LOSARTAN POTASSIUM 25 MG PO TABS
25.0000 mg | ORAL_TABLET | Freq: Every day | ORAL | Status: DC
  Administered 2014-10-16 – 2014-10-17 (×2): 25 mg via ORAL
  Filled 2014-10-16 (×2): qty 1

## 2014-10-16 MED ORDER — PEG-KCL-NACL-NASULF-NA ASC-C 100 G PO SOLR
0.5000 | Freq: Once | ORAL | Status: AC
  Administered 2014-10-16: 100 g via ORAL
  Filled 2014-10-16: qty 1

## 2014-10-16 NOTE — Progress Notes (Signed)
Patient stated he would self administer CPAP when ready. Encouraged patient to call RN  if he needs any assistance.

## 2014-10-16 NOTE — Progress Notes (Signed)
Patient ID: Stanley [name], male   DOB: 31-Mar-1925, 80 y.o.   MRN: [id number]  [facility] Progress Note  Subjective: Feels stronger , wants to get up and walk today. Hgb 11.1,stable Cardiology notes he has evidence of 1st and second degree heart block with HR in 30's especially when asleep-considering pacemaker  Objective:  Vital signs in last 24 hours: Temp:  [97.6 F (36.4 C)-97.8 F (36.6 C)] 97.6 F (36.4 C) (10/28 0715) Pulse Rate:  [50-110] 82 (10/28 0715) Resp:  [15-22] 18 (10/28 0715) BP: (116-180)/(49-83) 125/55 mmHg (10/28 0715) SpO2:  [95 %-100 %] 100 % (10/28 0715) Last BM Date: 10/14/14 General:   Alert,  Well-developed,  Elderly WM  in NAD Heart:  Regular rate and rhythm; no murmurs Pulm;clear Abdomen:  Soft, nontender and nondistended. Normal bowel sounds, without guarding, and without rebound.   Extremities:  Without edema. Neurologic:  Alert and  oriented x4;  grossly normal neurologically. Psych:  Alert and cooperative. Normal mood and affect.  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 OK for colonoscopy d/w Dr. Dalbert Garnet Will do that tomorrow  and may be able to go home after or next day  Gatha Mayer, MD, [facility] [phone] (pager) 10/16/2014 5:36 PM

## 2014-10-16 NOTE — Progress Notes (Signed)
    Subjective:  Denies CP or dyspnea at rest; some improvement in strength with transfusion   Objective:  Filed Vitals:   10/15/14 1450 10/15/14 1459 10/15/14 2121 10/15/14 2200  BP: 126/60 126/60  156/53  Pulse: 55 54 61 51  Temp:    97.8 F (36.6 C)  TempSrc:    Oral  Resp: 18 17 18 18   Height:      Weight:      SpO2: 99% 95% 97% 98%    Intake/Output from previous day:  Intake/Output Summary (Last 24 hours) at 10/16/14 8921 Last data filed at 10/15/14 1930  Gross per 24 hour  Intake    320 ml  Output    900 ml  Net   -580 ml    Physical Exam: Physical exam: Well-developed well-nourished in no acute distress.  Skin is warm and dry.  HEENT is normal.  Neck is supple.  Chest is clear to auscultation with normal expansion.  Cardiovascular exam is regular rate and rhythm. 2/6 systolic murmur LSB Abdominal exam nontender or distended. No masses palpated. Extremities show no edema. neuro grossly intact    Lab Results: Basic Metabolic Panel:  Recent Labs  10/15/14 0925 10/16/14 0517  NA 131* 135*  K 4.7 4.8  CL 95* 100  CO2 21 22  GLUCOSE 181* 109*  BUN 21 18  CREATININE 0.83 0.78  CALCIUM 9.2 9.3   CBC:  Recent Labs  10/15/14 0925 10/16/14 0517  WBC 7.1 6.4  HGB 10.9* 11.1*  HCT 33.4* 34.4*  MCV 81.5 82.3  PLT 247 247     Assessment/Plan:  80 year old male with past medical history of diabetes mellitus, coronary artery disease status post coronary artery bypass and graft, ischemic cardiomyopathy, aortic valve replacement, peptic ulcer disease admitted with progressive weakness and dyspnea on exertion. He has been found to be anemic with pending endoscopy. Cardiology was asked to evaluate for cardiac contribution and bradycardia.  1 dyspnea etiology unclear. Question contribution from anemia. EGD unremarkable; colonoscopy planned; some improvement with transfusion. Echo shows improved EF and normal aortic valve gradients. Bradycardia may also be  contributing. In reviewing telemetry the patient is noted to have first-degree AV block, second-degree AV block type I, 2:1 AV block and occasional junctional escape beats. Heart rate in the 30s predominantly with sleeping. Will review with EP; may need pacemaker. 2 Acute on chronic anemia-further evaluation per gastroenterology. EGD negative; colonoscopy planned. 3 history of aortic valve replacement-FU echo as outlined above 4 bradycardia-follow telemetry. Will review with EP; may need pacemaker 5 coronary artery disease-Aspirin on hold. Resume statin. 6 ischemic cardiomyopathy-patient does not appear to be volume overloaded on examination. Resume cozaar 25 mg daily. No beta blocker given bradycardia.  Kirk Ruths 10/16/2014, 6:42 AM

## 2014-10-16 NOTE — Anesthesia Preprocedure Evaluation (Addendum)
Anesthesia Evaluation  Patient identified by MRN, date of birth, ID band Patient awake    Reviewed: Allergy & Precautions, H&P , NPO status , Patient's Chart, lab work & pertinent test results  History of Anesthesia Complications Negative for: history of anesthetic complications  Airway Mallampati: II  TM Distance: >3 FB Neck ROM: Full    Dental no notable dental hx. (+) Poor Dentition, Missing   Pulmonary sleep apnea and Continuous Positive Airway Pressure Ventilation ,  breath sounds clear to auscultation  Pulmonary exam normal       Cardiovascular Exercise Tolerance: Good hypertension, Pt. on medications + CAD, + CABG, + Peripheral Vascular Disease and +CHF + dysrhythmias Rhythm:Regular Rate:Normal     Neuro/Psych negative neurological ROS  negative psych ROS   GI/Hepatic Neg liver ROS, GERD-  Medicated and Controlled,  Endo/Other  diabetes, Type 2, Oral Hypoglycemic AgentsHypothyroidism   Renal/GU negative Renal ROS  negative genitourinary   Musculoskeletal  (+) Arthritis -,   Abdominal   Peds negative pediatric ROS (+)  Hematology negative hematology ROS (+)   Anesthesia Other Findings Hx of pseudocholinesterase deficiency  Reproductive/Obstetrics negative OB ROS                            Anesthesia Physical Anesthesia Plan  ASA: III  Anesthesia Plan: MAC   Post-op Pain Management:    Induction: Intravenous  Airway Management Planned: Nasal Cannula  Additional Equipment:   Intra-op Plan:   Post-operative Plan: Extubation in OR  Informed Consent: I have reviewed the patients History and Physical, chart, labs and discussed the procedure including the risks, benefits and alternatives for the proposed anesthesia with the patient or authorized representative who has indicated his/her understanding and acceptance.   Dental advisory given  Plan Discussed with:  CRNA  Anesthesia Plan Comments:         Anesthesia Quick Evaluation

## 2014-10-16 NOTE — Consult Note (Signed)
ELECTROPHYSIOLOGY CONSULT NOTE  Patient ID: DEONTA BOMBERGER, MRN: [id number], DOB/AGE: November 19, 1925 80 y.o. Admit date: 10/14/2014 Date of Consult: 10/16/2014  Primary Physician: [name], MD Primary Cardiologist: DM  Chief Complaint: bradycardia   HPI ROGELIO WAYNICK is a 80 y.o. male  Seen at the request of Dr. London Sheer because of bradycardia and lassitude.  He was admitted a couple of days ago when he presented with fatigue and anemia and guaiac positive stool. He has a history of coronary disease with prior bypass and aortic valve replacement-bioprosthesis (8/11)  Telemetry has demonstrated Mobitz 1 heart block, some sinus bradycardia, and occasional episodes of consecutively nonconducted P waves. These on review likely happened when the patient was asleep without C Pap.  He has noted a gradual but distinct change in exercise tolerance over the last 3-4 weeks characterized by chest discomfort, dyspnea and fatigue. He has had no syncope.  Review of old tracings failed to demonstrate evidence of Mobitz heart block or sinus bradycardia; ECGs have been sparse in recent months Echocardiogram 10/15 demonstrated interval improvement in ejection fraction to 45-50% ,  no significant aortic valve gradient was noted;  he has moderate left atrial enlargement (48/2.4/43)  Past Medical History  Diagnosis Date  . Diabetes mellitus   . BPH (benign prostatic hyperplasia)   . Arthritis   . GERD (gastroesophageal reflux disease)   . Neuromuscular disorder   . Hypertension     no meds  . Shortness of breath     uses cpap  . 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 Failed total knee, right   . H pylori ulcer     Gastric ulcer      Surgical History:  Past Surgical History  Procedure Laterality Date  . Aortic valve replacement    . Foot arthrotomy Right   . Tonsillectomy    . Cataract extraction, bilateral      . Humerus fracture surgery    . Replacement total knee Bilateral   . Toe amputation      rt 2nd  . Cardiac catheterization    . Amputation  01/19/2012    Procedure: AMPUTATION DIGIT;  Surgeon: Colin Rhein, MD;  Location: [facility];  Service: Orthopedics;  Laterality: Right;  right great toe amputation through MTP joint  . Rotator cuff repair Right      Home Meds: Prior to Admission medications   Medication Sig Start Date End Date Taking? Authorizing Provider  ALPRAZolam (XANAX) 0.25 MG tablet Take 1 tablet (0.25 mg total) by mouth every 6 (six) hours as needed. 07/30/14  Yes [name], MD  aspirin 81 MG tablet Take 81 mg by mouth daily.   Yes Historical Provider, MD  Calcium Carbonate-Vitamin D (CALCIUM-VITAMIN D) 500-200 MG-UNIT per tablet Take 1 tablet by mouth 2 (two) times daily.    Yes Historical Provider, MD  clotrimazole-betamethasone (LOTRISONE) cream Apply 1 application topically daily as needed (anti fungal).  01/20/13  Yes Historical Provider, MD  diclofenac (VOLTAREN) 75 MG EC tablet take 1 tablet by mouth twice a day 10/09/14  Yes [name], MD  diphenoxylate-atropine (LOMOTIL) 2.5-0.025 MG per tablet TAKE 1 TABLET 4 TIMES DAILY AS NEEDED FOR DIARRHEA OR LOOSE STOOLS. 04/16/14  Yes [name], MD  diphenoxylate-atropine (LOMOTIL) 2.5-0.025 MG per tablet Take 1 tablet by mouth 4 (four) times daily as needed for diarrhea or loose stools.   Yes  Historical Provider, MD  esomeprazole (NEXIUM) 40 MG capsule Take 1 capsule (40 mg total) by mouth daily. 10/02/14  Yes [name], MD  FeFum-FePoly-FA-B Cmp-C-Biot (INTEGRA PLUS) CAPS Take 1 capsule by mouth every morning. 10/09/14  Yes Curt Bears, MD  glucose blood (ONE TOUCH TEST STRIPS) test strip Dispense one touch ultra, test once per day and diagnosis code is 250.00 07/23/13  Yes [name], MD  HYDROcodone-acetaminophen (NORCO) 10-325 MG per tablet Take 1 tablet by mouth every 6 (six) hours as needed  for severe pain. 10/02/14  Yes [name], MD  losartan (COZAAR) 25 MG tablet Take 1 tablet (25 mg total) by mouth daily. 11/21/13  Yes Larey Dresser, MD  mirabegron ER (MYRBETRIQ) 50 MG TB24 tablet Take 50 mg by mouth daily.   Yes Historical Provider, MD  Multiple Vitamin (MULITIVITAMIN WITH MINERALS) TABS Take 1 tablet by mouth daily.   Yes Historical Provider, MD  pioglitazone (ACTOS) 30 MG tablet take 1 tablet by mouth once daily 10/02/14  Yes [name], MD  prochlorperazine (COMPAZINE) 10 MG tablet take 1 tablet by mouth four times a day for nausea 07/04/13  Yes [name], MD  rosuvastatin (CRESTOR) 20 MG tablet Take 10 mg by mouth daily.  11/21/13  Yes Larey Dresser, MD  sitaGLIPtin-metformin (JANUMET) 50-1000 MG per tablet take 1 tablet by mouth twice a day WITH A MEAL. 10/02/14  Yes [name], MD  tamsulosin (FLOMAX) 0.4 MG CAPS capsule Take 0.4 mg by mouth daily after breakfast.  09/04/14  Yes Historical Provider, MD  dutasteride (AVODART) 0.5 MG capsule Take 1 capsule (0.5 mg total) by mouth daily. 10/02/14   [name], MD    Inpatient Medications:  . sodium chloride   Intravenous Once  . dutasteride  0.5 mg Oral Daily  . insulin aspart  0-9 Units Subcutaneous TID WC  . losartan  25 mg Oral Daily  . mirabegron ER  50 mg Oral Daily  . pantoprazole  40 mg Oral Q0600  . peg 3350 powder  0.5 kit Oral Once   And  . peg 3350 powder  0.5 kit Oral Once  . rosuvastatin  20 mg Oral q1800  . sodium chloride  3 mL Intravenous Q12H  . sodium chloride  3 mL Intravenous Q12H  . tamsulosin  0.4 mg Oral Daily     Allergies:  Allergies  Allergen Reactions  . Morphine And Related Nausea And Vomiting  . Succinylcholine Other (See Comments)    unknown  . Ace Inhibitors Other (See Comments)    cough    History   Social History  . Marital Status: Married    Spouse Name: N/A    Number of Children: N/A  . Years of Education: N/A   Occupational History  . Not on  file.   Social History Main Topics  . Smoking status: Never Smoker   . Smokeless tobacco: Never Used  . Alcohol Use: 4.2 oz/week    7 Glasses of wine per week  . Drug Use: No  . Sexual Activity: Not on file   Other Topics Concern  . Not on file   Social History Narrative   Married, retired primary care MD    + children           Family History  Problem Relation Age of Onset  . Diabetes type II Mother   . Diabetes type II Father   . Colon cancer Neg  Hx   . Esophageal cancer Neg Hx   . Rectal cancer Neg Hx   . Stomach cancer Neg Hx      ROS:  Please see the history of present illness.     All other systems reviewed and negative.    Physical Exam:   Blood pressure 144/63, pulse 77, temperature 98.5 F (36.9 C), temperature source Oral, resp. rate 18, height _0  (1.676 m), weight 186 lb 4.6 oz (84.5 kg), SpO2 100.00%. General: Well developed, well nourished male in no acute distress. Head: Normocephalic, atraumatic, sclera non-icteric, no xanthomas, nares are without discharge. EENT: normal Lymph Nodes:  none Back: without scoliosis/kyphosis, no CVA tendersness Neck: Negative for carotid bruits. JVD not elevated. Lungs: Clear bilaterally to auscultation without wheezes, rales, or rhonchi. Breathing is unlabored. Heart: RRR with S1 S2.  2/6 systolic murmur , rubs, or gallops appreciated. Abdomen: Soft, non-tender, non-distended with normoactive bowel sounds. No hepatomegaly. No rebound/guarding. No obvious abdominal masses. Msk:  Strength and tone appear normal for age. Extremities: No clubbing or cyanosis. No   edema.  Distal pedal pulses are 2+ and equal bilaterally. Skin: Warm and Dry Neuro: Alert and oriented X 3. CN III-XII intact Grossly normal sensory and motor function . Psych:  Responds to questions appropriately with a normal affect.      Labs: Cardiac Enzymes No results found for this basename: CKTOTAL, CKMB, TROPONINI,  in the last 72 hours CBC Lab  Results  Component Value Date   WBC 6.4 10/16/2014   HGB 11.1* 10/16/2014   HCT 34.4* 10/16/2014   MCV 82.3 10/16/2014   PLT 247 10/16/2014   PROTIME: No results found for this basename: LABPROT, INR,  in the last 72 hours Chemistry  Recent Labs Lab 10/14/14 1456  10/16/14 0517  NA 130*  < > 135*  K 5.9*  < > 4.8  CL 96  < > 100  CO2 21  < > 22  BUN 36*  < > 18  CREATININE 1.29  < > 0.78  CALCIUM 8.9  < > 9.3  PROT 6.3  --   --   BILITOT <0.2*  --   --   ALKPHOS 73  --   --   ALT 16  --   --   AST 18  --   --   GLUCOSE 128*  < > 109*  < > = values in this interval not displayed. Lipids Lab Results  Component Value Date   CHOL 138 10/02/2014   HDL 39.20 10/02/2014   LDLCALC 67 10/02/2014   TRIG 159.0* 10/02/2014   BNP Pro B Natriuretic peptide (BNP)  Date/Time Value Ref Range Status  10/14/2014  2:52 PM 936.4* 0 - 450 pg/mL Final  04/26/2011  5:02 PM 142.0* 0.0 - 100.0 pg/mL Final   Miscellaneous No results found for this basename: DDIMER    Radiology/Studies:  X-ray Chest Pa And Lateral  10/14/2014   CLINICAL DATA:  GI bleed.  Shortness of breath.  EXAM: CHEST  2 VIEW  COMPARISON:  05/14/2014.  FINDINGS: Trachea is midline. Heart size stable. Probable mild scarring at the lung bases. Lungs are otherwise clear. No pleural fluid.  Multiple old left rib fractures are seen with advanced degenerative change in the left shoulder.  IMPRESSION: No acute findings.   Electronically Signed   By: Lorin Picket M.D.   On: 10/14/2014 15:27    EKG:  Pending    Assessment and Plan:   Mobitz 1 2AVBlock  with occ consecutively dropped Pwaves (10/28--1000h)  Sinus brady intermittent  Aortic valve replacement  Obstructive sleep apnea  The patient is noted to have second degree Mobitz type I heart block with some sinus bradycardia. This occurs in a hospitalization for weakness also noteworthy for GI blood loss anemia with a hemoglobin in the 8 range down from 12-13 about a  year ago. The question is whether the bradycardia is contributing to his symptoms over the last 3 or 4 weeks where he has noted significant change in energy level and exercise tolerance. He has known sleep apnea and a higher grade heart block apparently occurred this morning while he was sleeping without his C Pap. Hence, it is not clear to me to what degree the bradycardia is responsible for his symptoms. I would suggest a conservative approach wherein we allow his hemoglobin to normalize and keep a close eye on his heart rhythm. In the context of his aortic valve surgery, AV conduction disease is not unexpected and may well be a separate and contributing factor to his exercise intolerance. In that event we would expect it to progress.  For now, I would recommend that we 1) continue evaluation for his GI blood loss and replete his iron 2) follows heart rhythm while in hospital 3) is reasonable to utilize an outpatient event recorder which wasn't only during the day as he has known sleep apnea and would anticipate to have this cause alarms.     Virl Axe

## 2014-10-17 ENCOUNTER — Ambulatory Visit: Payer: Self-pay

## 2014-10-17 ENCOUNTER — Encounter (HOSPITAL_COMMUNITY)
Admission: AD | Disposition: A | Discharge status: Home / Self Care | Payer: Self-pay | Source: Ambulatory Visit | Attending: Internal Medicine

## 2014-10-17 ENCOUNTER — Encounter (HOSPITAL_COMMUNITY): Payer: Medicare Other | Admitting: Anesthesiology

## 2014-10-17 ENCOUNTER — Inpatient Hospital Stay (HOSPITAL_COMMUNITY): Payer: Medicare Other | Admitting: Anesthesiology

## 2014-10-17 ENCOUNTER — Other Ambulatory Visit: Payer: Self-pay | Admitting: Physician Assistant

## 2014-10-17 ENCOUNTER — Encounter (HOSPITAL_COMMUNITY): Payer: Self-pay | Admitting: Internal Medicine

## 2014-10-17 ENCOUNTER — Ambulatory Visit: Payer: Self-pay | Admitting: Hematology and Oncology

## 2014-10-17 DIAGNOSIS — R001 Bradycardia, unspecified: Secondary | ICD-10-CM

## 2014-10-17 DIAGNOSIS — K635 Polyp of colon: Secondary | ICD-10-CM | POA: Diagnosis present

## 2014-10-17 DIAGNOSIS — I441 Atrioventricular block, second degree: Secondary | ICD-10-CM

## 2014-10-17 HISTORY — PX: COLONOSCOPY WITH PROPOFOL: SHX5780

## 2014-10-17 LAB — GLUCOSE, CAPILLARY: GLUCOSE-CAPILLARY: 145 mg/dL — AB (ref 70–99)

## 2014-10-17 LAB — CBC
HCT: 37.3 % — ABNORMAL LOW (ref 39.0–52.0)
Hemoglobin: 12.1 g/dL — ABNORMAL LOW (ref 13.0–17.0)
MCH: 26.9 pg (ref 26.0–34.0)
MCHC: 32.4 g/dL (ref 30.0–36.0)
MCV: 83.1 fL (ref 78.0–100.0)
Platelets: 290 10*3/uL (ref 150–400)
RBC: 4.49 MIL/uL (ref 4.22–5.81)
RDW: 16 % — ABNORMAL HIGH (ref 11.5–15.5)
WBC: 8.1 10*3/uL (ref 4.0–10.5)

## 2014-10-17 SURGERY — COLONOSCOPY WITH PROPOFOL
Anesthesia: Monitor Anesthesia Care

## 2014-10-17 MED ORDER — LOSARTAN POTASSIUM 25 MG PO TABS: 25.0000 mg | ORAL_TABLET | Freq: Every day | ORAL | Status: DC

## 2014-10-17 MED ORDER — PROPOFOL 10 MG/ML IV BOLUS
INTRAVENOUS | Status: DC | PRN
  Administered 2014-10-17 (×2): 50 mg via INTRAVENOUS
  Administered 2014-10-17: 100 mg via INTRAVENOUS

## 2014-10-17 MED ORDER — PANTOPRAZOLE SODIUM 40 MG PO TBEC: 40.0000 mg | DELAYED_RELEASE_TABLET | Freq: Every day | ORAL | Status: DC

## 2014-10-17 MED ORDER — LACTATED RINGERS IV SOLN
INTRAVENOUS | Status: DC
  Administered 2014-10-17: 1000 mL via INTRAVENOUS

## 2014-10-17 MED ORDER — PROPOFOL 10 MG/ML IV BOLUS
INTRAVENOUS | Status: AC
  Filled 2014-10-17: qty 20

## 2014-10-17 SURGICAL SUPPLY — 22 items

## 2014-10-17 NOTE — Progress Notes (Signed)
    Subjective:  Denies CP or dyspnea at rest; some improvement in strength since admission.   Objective:  Filed Vitals:   10/16/14 0715 10/16/14 1356 10/16/14 2200 10/17/14 0500  BP: 125/55 144/63 161/84 143/67  Pulse: 82 77 80 82  Temp: 97.6 F (36.4 C) 98.5 F (36.9 C) 97.9 F (36.6 C) 97.6 F (36.4 C)  TempSrc: Oral Oral Oral Oral  Resp: 18 18 18 18   Height:      Weight:      SpO2: 100% 100% 100% 99%    Intake/Output from previous day:  Intake/Output Summary (Last 24 hours) at 10/17/14 0714 Last data filed at 10/16/14 1945  Gross per 24 hour  Intake    240 ml  Output    675 ml  Net   -435 ml    Physical Exam: Physical exam: Well-developed well-nourished in no acute distress.  Skin is warm and dry.  HEENT is normal.  Neck is supple.  Chest is clear to auscultation with normal expansion.  Cardiovascular exam is regular rate and rhythm. 2/6 systolic murmur LSB Abdominal exam nontender or distended. No masses palpated. Extremities show no edema. neuro grossly intact    Lab Results: Basic Metabolic Panel:  Recent Labs  10/15/14 0925 10/16/14 0517  NA 131* 135*  K 4.7 4.8  CL 95* 100  CO2 21 22  GLUCOSE 181* 109*  BUN 21 18  CREATININE 0.83 0.78  CALCIUM 9.2 9.3   CBC:  Recent Labs  10/15/14 0925 10/16/14 0517  WBC 7.1 6.4  HGB 10.9* 11.1*  HCT 33.4* 34.4*  MCV 81.5 82.3  PLT 247 247     Assessment/Plan:  80 year old male with past medical history of diabetes mellitus, coronary artery disease status post coronary artery bypass and graft, ischemic cardiomyopathy, aortic valve replacement, peptic ulcer disease admitted with progressive weakness and dyspnea on exertion. He has been found to be anemic with GI WU in progress; some improvement in symptoms with transfusion. Cardiology was asked to evaluate for cardiac contribution and bradycardia.  1 dyspnea etiology unclear. Question contribution from anemia. EGD unremarkable; colonoscopy  planned for later today; some improvement with transfusion. Echo shows improved EF and normal aortic valve gradients. Bradycardia may also be contributing. Telemetry continues to show first-degree AV block, second-degree AV block type I, 2:1 AV block and occasional junctional escape beats. Heart rate in the 30s predominantly with sleeping. Dr Caryl Comes has reviewed and has recommended outpt event monitor (if symptoms correlate with bradycardia, then pacemaker would be indicated). Patient to not wear while asleep. 2 Acute on chronic anemia-further evaluation per gastroenterology. EGD negative; colonoscopy planned. 3 history of aortic valve replacement-FU echo as outlined above 4 bradycardia-plan outpatient event monitor as outlined above. 5 coronary artery disease-Aspirin on hold. Continue statin. 6 ischemic cardiomyopathy-patient does not appear to be volume overloaded on examination. Continue cozaar 25 mg daily; advance as outpatient as tolerated. No beta blocker given bradycardia. Patient can be DCed from a cardiac standpoint and FU with Dr Aundra Dubin in November as scheduled. Kirk Ruths 10/17/2014, 7:14 AM

## 2014-10-17 NOTE — Anesthesia Postprocedure Evaluation (Signed)
  Anesthesia Post-op Note  Patient: Stanley [name]  Procedure(s) Performed: Procedure(s) (LRB): COLONOSCOPY WITH PROPOFOL (N/A)  Patient Location: PACU  Anesthesia Type: MAC  Level of Consciousness: awake and alert   Airway and Oxygen Therapy: Patient Spontanous Breathing  Post-op Pain: mild  Post-op Assessment: Post-op Vital signs reviewed, Patient's Cardiovascular Status Stable, Respiratory Function Stable, Patent Airway and No signs of Nausea or vomiting  Last Vitals:  Filed Vitals:   10/17/14 1300  BP: 115/62  Pulse: 62  Temp:   Resp: 18    Post-op Vital Signs: stable   Complications: No apparent anesthesia complications

## 2014-10-17 NOTE — Op Note (Addendum)
[facility] [place], [address]   COLONOSCOPY PROCEDURE REPORT  PATIENT: Stanley [name], Stanley [name]  MR#: [id number] BIRTHDATE: 11/28/1925 , 88  yrs. old GENDER: male ENDOSCOPIST: Gatha Mayer, MD, [facility] PROCEDURE DATE:  10/17/2014 PROCEDURE:   Colonoscopy with snare polypectomy First Screening Colonoscopy - Avg.  risk and is 50 yrs.  old or older - No.  Prior Negative Screening - Now for repeat screening. N/A  History of Adenoma - Now for follow-up colonoscopy & has been > or = to 3 yrs.  Yes hx of adenoma.  Has been 3 or more years since last colonoscopy.  Polyps Removed Today? Yes. ASA CLASS:   Class III INDICATIONS:heme-positive stool and iron deficiency anemia. MEDICATIONS: Per Anesthesia DESCRIPTION OF PROCEDURE:   After the risks benefits and alternatives of the procedure were thoroughly explained, informed consent was obtained.  The digital rectal exam revealed no abnormalities of the rectum.   The Pentax Ped Colon [id number] endoscope was introduced through the anus and advanced to the cecum, which was identified by both the appendix and ileocecal valve. No adverse events experienced.   The quality of the prep was adequate, using MoviPrep  The instrument was then slowly withdrawn as the colon was fully examined.  COLON FINDINGS: A flat polyp measuring 7 mm in size was found at the cecum.  A polypectomy was performed using snare cautery.  The resection was complete, the polyp tissue was completely retrieved and sent to histology.   There was diverticulosis noted throughout the entire examined colon.   The examination was otherwise normal. Retroflexed views revealed internal hemorrhoids. The time to cecum=2 minutes 0 seconds.  Withdrawal time=20 minutes 0 seconds. The scope was withdrawn and the procedure completed. COMPLICATIONS: There were no immediate complications.  ENDOSCOPIC IMPRESSION: 1.   Flat polyp was found at the cecum; polypectomy was  performed using snare cautery (mostly cold - minimal cautery used with cold cinch first 2.   Diverticulosis was noted throughout the entire examined colon 3.   The examination was otherwise normal except internal hemorrhoids RECOMMENDATIONS: Stay off ASA x 1 week then may start 81 mg qd try to stay of other NSAID's - wonder if they have caused some small bowel erosions. Could use again for arthritis (prednisone causes hyperglycemia) but stay on PPI and ferrous sulfate and have periodic CBC (quarterly) once iron and Hgb NL Not inclined to pursue capsule endoscopy now. Discussed w/ primary GI Dr. Fuller Plan - he can see him prn He will change from OTC Nexium to 40 mg pantoprazole daily at dc (new Rx) He should be able to go home today  eSigned:  Gatha Mayer, MD, [facility] 10/17/2014 1:08 PM Revised: 10/17/2014 1:08 PM cc: The Patient and Gordan Payment, MD

## 2014-10-17 NOTE — Discharge Summary (Signed)
Stanley [name]  Name: Stanley [name] MRN: [id number] DOB: [birth year] 80 y.o. PCP:  [name], MD  Date of Admission: 10/14/2014  2:01 PM Date of Discharge: 10/17/2014 Attending Physician: Gatha Mayer, MD  Discharge Diagnosis: Active Problems:   Chronic systolic CHF (congestive heart failure)   Symptomatic anemia   Dyspnea on exertion   Heme + stool   Mobitz type 1 second degree atrioventricular block   Colon polyp Acute on chronic anemia/fe deficiency/heme positive stool- enteroscopy and colonoscopy negative- possible small bowel ulcerations from chronic NSAID  use  Consultations: Cardiology- Dr. Stanford Breed Procedures Performed:  X-ray Chest Pa And Lateral  10/14/2014   CLINICAL DATA:  GI bleed.  Shortness of breath.  EXAM: CHEST  2 VIEW  COMPARISON:  05/14/2014.  FINDINGS: Trachea is midline. Heart size stable. Probable mild scarring at the lung bases. Lungs are otherwise clear. No pleural fluid.  Multiple old left rib fractures are seen with advanced degenerative change in the left shoulder.  IMPRESSION: No acute findings.   Electronically Signed   By: Lorin Picket M.D.   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 Hgb was 8.5 on admit.  he has known iron deficiency and had been receiving iron infusions outpt.  Hospital Course by problem list: Active Problems:   Chronic systolic CHF (congestive heart failure)   Symptomatic anemia   Dyspnea on exertion   Heme + stool   Mobitz type 1 second degree atrioventricular block   Colon polyp Pt was admitted on GI service. He was transfused 2 units  Of Prbcs , then had EGD and enteroscopy on 10/27 .Both were normal.  He was then prepped for colonoscopy on 10/29. Found  A flat polyp in  cecum which was removed , and diverticulosis. Pt was seen by cardiology, felt to have stable ischemic cardiomyopathy. Bradycardic into 30's, primarily with sleep and has 1st and 2nd degree AV block. He is to have event monitor as outpt, and f/u with Dr. Marigene Ehlers. He will go home on Cozaar 25 mg daily,and have switched Nexium to Protonix 40 mg daily Stop Voltaren. Hgb 12.1 on d/c  Continue iron infusions through Dr. Julien Nordmann.  Discharge Vitals:  BP 152/78  Pulse 54  Temp(Src) 98 F (36.7 C) (Oral)  Resp 20  Ht 5\' 6"  (1.676 m)  Wt 186 lb 4.6 oz (84.5 kg)  BMI 30.08 kg/m2  SpO2 98%  Discharge Labs:  Results for orders placed during the hospital encounter of 10/14/14 (from the past 24 hour(s))  GLUCOSE, CAPILLARY     Status: Abnormal   Collection Time    10/16/14  5:01 PM      Result Value Ref Range   Glucose-Capillary 185 (*) 70 - 99 mg/dL  GLUCOSE, CAPILLARY     Status: Abnormal   Collection Time    10/16/14  9:52 PM      Result Value Ref Range   Glucose-Capillary 109 (*) 70 - 99 mg/dL  GLUCOSE, CAPILLARY     Status: Abnormal   Collection Time    10/17/14  7:30 AM      Result Value Ref Range   Glucose-Capillary 145 (*) 70 - 99 mg/dL  CBC     Status: Abnormal   Collection Time    10/17/14  1:53 PM  Result Value Ref Range   WBC 8.1  4.0 - 10.5 K/uL   RBC 4.49  4.22 - 5.81 MIL/uL   Hemoglobin 12.1 (*) 13.0 - 17.0 g/dL   HCT 37.3 (*) 39.0 - 52.0 %   MCV 83.1  78.0 - 100.0 fL   MCH 26.9  26.0 - 34.0 pg   MCHC 32.4  30.0 - 36.0 g/dL   RDW 16.0 (*) 11.5 - 15.5 %   Platelets 290  150 - 400 K/uL    Disposition and follow-up:   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    Follow-up Appointments: [facility] cardiology for event monitor, and Dr Aundra Dubin in November- keep f/u with Dr. Julien Nordmann.  Follow up with Dr. Fuller Plan as needed   Discharge Medications:   Medication List    STOP taking these medications       diclofenac 75 MG EC  tablet  Commonly known as:  VOLTAREN     esomeprazole 40 MG capsule  Commonly known as:  R[place]on these medications       ALPRAZolam 0.25 MG tablet  Commonly known as:  XANAX  Take 1 tablet (0.25 mg total) by mouth every 6 (six) hours as needed.     aspirin 81 MG tablet  Take 81 mg by mouth daily.     calcium-vitamin D 500-200 MG-UNIT per tablet  Take 1 tablet by mouth 2 (two) times daily.     clotrimazole-betamethasone cream  Commonly known as:  LOTRISONE  Apply 1 application topically daily as needed (anti fungal).     diphenoxylate-atropine 2.5-0.025 MG per tablet  Commonly known as:  LOMOTIL  Take 1 tablet by mouth 4 (four) times daily as needed for diarrhea or loose stools.     diphenoxylate-atropine 2.5-0.025 MG per tablet  Commonly known as:  LOMOTIL  TAKE 1 TABLET 4 TIMES DAILY AS NEEDED FOR DIARRHEA OR LOOSE STOOLS.     dutasteride 0.5 MG capsule  Commonly known as:  AVODART  Take 1 capsule (0.5 mg total) by mouth daily.     glucose blood test strip  Commonly known as:  ONE TOUCH TEST STRIPS  Dispense one touch ultra, test once per day and diagnosis code is 250.00     HYDROcodone-acetaminophen 10-325 MG per tablet  Commonly known as:  NORCO  Take 1 tablet by mouth every 6 (six) hours as needed for severe pain.     INTEGRA PLUS Caps  Take 1 capsule by mouth every morning.     multivitamin with minerals Tabs tablet  Take 1 tablet by mouth daily.     MYRBETRIQ 50 MG Tb24 tablet  Generic drug:  mirabegron ER  Take 50 mg by mouth daily.     pantoprazole 40 MG tablet  Commonly known as:  PROTONIX  Take 1 tablet (40 mg total) by mouth daily.     pioglitazone 30 MG tablet  Commonly known as:  ACTOS  take 1 tablet by mouth once daily     prochlorperazine 10 MG tablet  Commonly known as:  COMPAZINE  take 1 tablet by mouth four times a day for nausea     rosuvastatin 20 MG tablet  Commonly known as:  CRESTOR  Take 10 mg by mouth daily.      sitaGLIPtin-metformin 50-1000 MG per tablet  Commonly known as:  JANUMET  take 1 tablet by mouth twice a day WITH A MEAL.  tamsulosin 0.4 MG Caps capsule  Commonly known as:  FLOMAX  Take 0.4 mg by mouth daily after breakfast.      ASK your doctor about these medications       losartan 25 MG tablet  Commonly known as:  COZAAR  Take 1 tablet (25 mg total) by mouth daily.  Ask about: Which instructions should I use?     losartan 25 MG tablet  Commonly known as:  COZAAR  Take 1 tablet (25 mg total) by mouth daily.  Ask about: Which instructions should I use?       Should take losartan 25 mg qd Signed: Nicoletta Ba 10/17/2014, 2:47 PM   [facility] Attending  I have also seen and assessed the patient and agree with the above note. Gatha Mayer, MD, [facility]

## 2014-10-17 NOTE — Progress Notes (Signed)
Patient to have an event monitor as an outpatient. Contacted the office. They will pre-certify him and call him. Follow up with Dr. Aundra Dubin as scheduled.  Rosaria Ferries, PA-C 10/17/2014 8:31 AM Beeper [phone]

## 2014-10-17 NOTE — Transfer of Care (Signed)
Immediate Anesthesia Transfer of Care Note  Patient: Stanley [name]  Procedure(s) Performed: Procedure(s) (LRB): COLONOSCOPY WITH PROPOFOL (N/A)  Patient Location: PACU  Anesthesia Type: MAC  Level of Consciousness: sedated, patient cooperative and responds to stimulation  Airway & Oxygen Therapy: Patient Spontanous Breathing and Patient connected to face mask oxgen  Post-op Assessment: Report given to PACU RN and Post -op Vital signs reviewed and stable  Post vital signs: Reviewed and stable  Complications: No apparent anesthesia complications

## 2014-10-17 NOTE — Progress Notes (Signed)
Patient ID: Stanley [name], male   DOB: January 26, 1925, 80 y.o.   MRN: [id number]  [facility] Progress Note  Subjective: Sleepy- up most of night off and on with prep . No specific complaints, awaiting colon   Objective:  Vital signs in last 24 hours: Temp:  [97.6 F (36.4 C)-98.5 F (36.9 C)] 97.6 F (36.4 C) (10/29 0500) Pulse Rate:  [77-82] 82 (10/29 0500) Resp:  [18] 18 (10/29 0500) BP: (143-161)/(63-84) 143/67 mmHg (10/29 0500) SpO2:  [99 %-100 %] 99 % (10/29 0500) Last BM Date: 10/16/14 General:   Alert,  Well-developed, elderly WM   in NAD Heart:  Regular rate and rhythm; no murmurs Pulm;clear Abdomen:  Soft, nontender and nondistended. Normal bowel sounds, without guarding, and without rebound.   Extremities:  Without edema. Neurologic:  Alert and  oriented x4;  grossly normal neurologically. Psych:  Alert and cooperative. Normal mood and affect.  Intake/Output from previous day: 10/28 0701 - 10/29 0700 In: 240 [P.O.:240] Out: 675 [Urine:675] Intake/Output this shift:    Lab Results:  Recent Labs  10/14/14 1456 10/15/14 0925 10/16/14 0517  WBC 7.4 7.1 6.4  HGB 9.0* 10.9* 11.1*  HCT 27.3* 33.4* 34.4*  PLT 224 247 247   BMET  Recent Labs  10/14/14 1456 10/15/14 0925 10/16/14 0517  NA 130* 131* 135*  K 5.9* 4.7 4.8  CL 96 95* 100  CO2 21 21 22   GLUCOSE 128* 181* 109*  BUN 36* 21 18  CREATININE 1.29 0.83 0.78  CALCIUM 8.9 9.2 9.3   LFT  Recent Labs  10/14/14 1456  PROT 6.3  ALBUMIN 3.6  AST 18  ALT 16  ALKPHOS 70  BILITOT <0.2*    Assessment / Plan: #1  80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 Enteroscopy negative. For colonoscopy today #2 CHF-stable #3 Bradycardia -primarily with sleep-1st and 2nd degree heart block- plan is for event monitor  as outpt , then decide about pacemaker #4 CAD #5 DM Active Problems:   Chronic systolic CHF (congestive heart failure)   Symptomatic anemia   Dyspnea on  exertion   Heme + stool   Mobitz type 1 second degree atrioventricular block     LOS: 3 days   Kriti Katayama  10/17/2014, 9:09 AM

## 2014-10-17 NOTE — Discharge Instructions (Signed)
Get you hgb checked in one week ,then again in 3-4 weeks to assure stays stable Stop Voltaren

## 2014-10-18 ENCOUNTER — Encounter (HOSPITAL_COMMUNITY): Payer: Self-pay | Admitting: Internal Medicine

## 2014-10-18 ENCOUNTER — Telehealth: Payer: Self-pay | Admitting: Medical Oncology

## 2014-10-18 ENCOUNTER — Ambulatory Visit (HOSPITAL_BASED_OUTPATIENT_CLINIC_OR_DEPARTMENT_OTHER): Payer: Medicare Other

## 2014-10-18 VITALS — BP 117/51 | HR 66 | Temp 97.6°F | Resp 18

## 2014-10-18 DIAGNOSIS — D649 Anemia, unspecified: Secondary | ICD-10-CM

## 2014-10-18 DIAGNOSIS — D509 Iron deficiency anemia, unspecified: Secondary | ICD-10-CM

## 2014-10-18 MED ORDER — SODIUM CHLORIDE 0.9 % IV SOLN
Freq: Once | INTRAVENOUS | Status: AC
  Administered 2014-10-18: 15:00:00 via INTRAVENOUS

## 2014-10-18 MED ORDER — SODIUM CHLORIDE 0.9 % IV SOLN
510.0000 mg | Freq: Once | INTRAVENOUS | Status: AC
  Administered 2014-10-18: 510 mg via INTRAVENOUS
  Filled 2014-10-18: qty 17

## 2014-10-18 MED ORDER — FERUMOXYTOL INJECTION 510 MG/17 ML: 510.0000 mg | Freq: Once | INTRAVENOUS | Status: DC

## 2014-10-18 NOTE — Patient Instructions (Signed)

## 2014-10-18 NOTE — Telephone Encounter (Signed)
Pt notified to keep appt today for feraheme

## 2014-10-21 ENCOUNTER — Encounter: Payer: Self-pay | Admitting: *Deleted

## 2014-10-21 ENCOUNTER — Encounter (INDEPENDENT_AMBULATORY_CARE_PROVIDER_SITE_OTHER): Payer: Medicare Other

## 2014-10-21 DIAGNOSIS — R001 Bradycardia, unspecified: Secondary | ICD-10-CM

## 2014-10-21 NOTE — Progress Notes (Signed)
Patient ID: Stanley [name], male   DOB: 1925/07/09, 80 y.o.   MRN: [id number] [employer] verite 30 day cardiac event monitor applied to patient.

## 2014-10-23 ENCOUNTER — Telehealth: Payer: Self-pay | Admitting: *Deleted

## 2014-10-23 NOTE — Telephone Encounter (Signed)
[facility] faxed clarification asking current therapy for this patient.  New order for Integra Plus but Ferrex 150 mg capsules also on patient's medication list.  Dr. Lonia Chimera notified.  Verbal order received and read back for the Ferrex to be discontinued.  Called [employer] at [phone], spoke with [facility] and order given to discontinue Ferrex 150 mg and current therapy is Integra Plus.

## 2014-10-24 ENCOUNTER — Other Ambulatory Visit: Payer: Self-pay

## 2014-10-24 DIAGNOSIS — D509 Iron deficiency anemia, unspecified: Secondary | ICD-10-CM

## 2014-10-24 NOTE — Progress Notes (Signed)
Quick Note:  I called result of polyp pathology (ssp, low-grade dysplasia) to him No need for recall colonoscopy  Please arrange a CBC to be done at [facility] - he is going there tomorrow - to f/u anemia  He can see Dr. Fuller Plan (primary GI prn) and I will cc Dr. Sarajane Jews this and CBC when I get it ______

## 2014-10-28 ENCOUNTER — Other Ambulatory Visit (INDEPENDENT_AMBULATORY_CARE_PROVIDER_SITE_OTHER): Payer: Medicare Other

## 2014-10-28 ENCOUNTER — Other Ambulatory Visit: Payer: Self-pay | Admitting: Family Medicine

## 2014-10-28 DIAGNOSIS — D509 Iron deficiency anemia, unspecified: Secondary | ICD-10-CM

## 2014-10-29 ENCOUNTER — Other Ambulatory Visit: Payer: Self-pay | Admitting: Family Medicine

## 2014-10-29 ENCOUNTER — Other Ambulatory Visit: Payer: Self-pay | Admitting: *Deleted

## 2014-10-29 LAB — CBC WITH DIFFERENTIAL/PLATELET
BASOS PCT: 0.5 % (ref 0.0–3.0)
Basophils Absolute: 0.1 10*3/uL (ref 0.0–0.1)
Eosinophils Absolute: 0.3 10*3/uL (ref 0.0–0.7)
Eosinophils Relative: 3.1 % (ref 0.0–5.0)
HCT: 30.6 % — ABNORMAL LOW (ref 39.0–52.0)
HEMOGLOBIN: 9.8 g/dL — AB (ref 13.0–17.0)
LYMPHS PCT: 15.4 % (ref 12.0–46.0)
Lymphs Abs: 1.6 10*3/uL (ref 0.7–4.0)
MCHC: 32 g/dL (ref 30.0–36.0)
MCV: 85.6 fl (ref 78.0–100.0)
Monocytes Absolute: 0.7 10*3/uL (ref 0.1–1.0)
Monocytes Relative: 7.1 % (ref 3.0–12.0)
NEUTROS PCT: 73.9 % (ref 43.0–77.0)
Neutro Abs: 7.5 10*3/uL (ref 1.4–7.7)
Platelets: 200 10*3/uL (ref 150.0–400.0)
RBC: 3.57 Mil/uL — AB (ref 4.22–5.81)
RDW: 17.3 % — ABNORMAL HIGH (ref 11.5–15.5)
WBC: 10.1 10*3/uL (ref 4.0–10.5)

## 2014-10-29 NOTE — Progress Notes (Signed)
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 Julien Nordmann and Sarajane Jews on this Dr. Fuller Plan prn ______

## 2014-11-04 ENCOUNTER — Ambulatory Visit: Payer: Medicare Other | Admitting: Physical Therapy

## 2014-11-04 ENCOUNTER — Other Ambulatory Visit (INDEPENDENT_AMBULATORY_CARE_PROVIDER_SITE_OTHER): Payer: Medicare Other

## 2014-11-04 DIAGNOSIS — D649 Anemia, unspecified: Secondary | ICD-10-CM

## 2014-11-04 LAB — POCT HEMOGLOBIN: Hemoglobin: 10.4 g/dL — AB (ref 14.1–18.1)

## 2014-11-05 ENCOUNTER — Encounter: Payer: Self-pay | Admitting: Family Medicine

## 2014-11-06 ENCOUNTER — Encounter: Payer: Self-pay | Admitting: Cardiology

## 2014-11-06 ENCOUNTER — Ambulatory Visit (INDEPENDENT_AMBULATORY_CARE_PROVIDER_SITE_OTHER): Payer: Medicare Other | Admitting: Cardiology

## 2014-11-06 VITALS — BP 112/58 | HR 60 | Ht 66.0 in | Wt 193.0 lb

## 2014-11-06 DIAGNOSIS — Z954 Presence of other heart-valve replacement: Secondary | ICD-10-CM

## 2014-11-06 DIAGNOSIS — R001 Bradycardia, unspecified: Secondary | ICD-10-CM

## 2014-11-06 DIAGNOSIS — I2510 Atherosclerotic heart disease of native coronary artery without angina pectoris: Secondary | ICD-10-CM

## 2014-11-06 DIAGNOSIS — Z952 Presence of prosthetic heart valve: Secondary | ICD-10-CM

## 2014-11-06 DIAGNOSIS — D509 Iron deficiency anemia, unspecified: Secondary | ICD-10-CM

## 2014-11-06 NOTE — Patient Instructions (Signed)
You have a follow-up appointment with Dr Aundra Dubin Wednesday December 16,2015 at 1:15PM.

## 2014-11-07 DIAGNOSIS — R001 Bradycardia, unspecified: Secondary | ICD-10-CM | POA: Insufficient documentation

## 2014-11-07 NOTE — Progress Notes (Signed)
Patient ID: Stanley [name], male   DOB: December 10, 1925, 80 y.o.   MRN: [id number] PCP: Dr. Sarajane Jews  80 yo with history of PAD, CABG/AVR, diabetes, ischemic cardiomyopathy, and iron deficiency anemia presents for cardiology followup.   He has severe, unrevascularizable bilateral tibial disease.  Legs are weak but he denies significant claudication-type symptoms. He was recently admitted to [facility] with symptomatic anemia (dyspnea).  He was transfused and had EGD with enteroscopy and colonoscopy.  These tests did not show a definite cause for blood loss. While in the hospital, he was noted to be bradycardic as low as the 30s while sleeping and to have episodes of type I 2nd degree AV block.  He was asymptomatic.  He is now wearing a 30 day monitor.    He is doing ok symptomatically.  Mild dyspnea after walking about 100 yards.  He uses a walker when he is outside the house for stability.  No chest pain.  He actually just got back from a trip to the [place] with his sons.  He had no problems while there.  No lightheadedness or syncope.  No orthopnea or PND.  Last echo showed improvement in EF to 45-50%.    Labs (8/14): K 4.4, creatinine 0.9 Labs (11/14): K 4.2, creatinine 0.6 Labs (2/15): LDL 80, HDL 46 Labs (10/15): HCT 30.6, K 4.8, creatinine 0.78, pro-BNP 936  ECG: NSR, 1st degree AV block, iRBBB, poor RWP  PMH: 1. PAD: peripheral angiography (3/13) showed severe, unreconstructable bilateral tibial artery disease.  He had a chronic ulcer on his right foot that has actually now healed.  He did lose 2 toes on his right foot.  2. Orthostatic hypotension 3. Carotid dopplers (3/14) with 0-39% bilateral stenosis.  4. OSA: Uses CPAP.  5. GERD 6. BPH 7. Type II diabetes 8. H/o HTN but not on meds for this now.  ACEI cough.  9. Anemia: Fe deficiency.  10/15 admission with anemia.  EGD and enteroscopy were unremarkable colonoscopy showed a cecal polyp and diverticulosis.  10. CAD: CABG at time of  AVR in 8/11 with LIMA-LAD and SVG-PDA.  11. Aortic stenosis: Bioprosthetic AVR in 8/11.  Pre-op echo in 3/11 showed EF 60-65% with severe AS.  Bioprosthetic aortic valve looked ok on 11/14 echo.  12. Hyperlipidemia: Myalgias with Crestor 20 mg daily but can tolerate 10 mg daily.  13. Ischemic cardiomyopathy: Echo (11/14) with EF 35-40%, mild LVH, bioprosthetic aortic valve with no significant AS or AI, mild MR, mildly dilated RV with mild to moderately decreased RV systolic function. Echo (10/15) with EF 45-50%, normal bioprosthetic aortic valve.  14. Low back pain 15. Bradycardia, Mobitz type I 2nd degree AV block.   SH: Married, retired Engineer, drilling, occasional ETOH, no smoking.   FH: Type II diabetes  ROS: All systems reviewed and negative except as per HPI.   Current Outpatient Prescriptions  Medication Sig Dispense Refill  . ALPRAZolam (XANAX) 0.25 MG tablet Take 1 tablet (0.25 mg total) by mouth every 6 (six) hours as needed. 120 tablet 5  . aspirin 81 MG tablet Take 81 mg by mouth daily.    . Calcium Carbonate-Vitamin D (CALCIUM-VITAMIN D) 500-200 MG-UNIT per tablet Take 1 tablet by mouth 2 (two) times daily.     . diclofenac (VOLTAREN) 75 MG EC tablet Take 75 mg by mouth 2 (two) times daily.     . diphenoxylate-atropine (LOMOTIL) 2.5-0.025 MG per tablet TAKE 1 TABLET 4 TIMES DAILY AS NEEDED FOR DIARRHEA  OR LOOSE STOOLS. 60 tablet 3  . dutasteride (AVODART) 0.5 MG capsule Take 1 capsule (0.5 mg total) by mouth daily. 90 capsule 3  . FeFum-FePoly-FA-B Cmp-C-Biot (INTEGRA PLUS) CAPS Take 1 capsule by mouth every morning. 30 capsule 3  . glucose blood (ONE TOUCH TEST STRIPS) test strip Dispense one touch ultra, test once per day and diagnosis code is 250.00 100 each 3  . HYDROcodone-acetaminophen (NORCO) 10-325 MG per tablet Take 1 tablet by mouth every 6 (six) hours as needed for severe pain. 120 tablet 0  . losartan (COZAAR) 25 MG tablet Take 1 tablet (25 mg total) by mouth daily. 90  tablet 3  . mirabegron ER (MYRBETRIQ) 50 MG TB24 tablet Take 50 mg by mouth daily.    . Multiple Vitamin (MULITIVITAMIN WITH MINERALS) TABS Take 1 tablet by mouth daily.    . pantoprazole (PROTONIX) 40 MG tablet Take 1 tablet (40 mg total) by mouth daily. 60 tablet 3  . pioglitazone (ACTOS) 30 MG tablet take 1 tablet by mouth once daily 90 tablet 3  . prochlorperazine (COMPAZINE) 10 MG tablet take 1 tablet by mouth four times a day for nausea 100 tablet 3  . rosuvastatin (CRESTOR) 20 MG tablet Take 10 mg by mouth daily.     . sitaGLIPtin-metformin (JANUMET) 50-1000 MG per tablet take 1 tablet by mouth twice a day WITH A MEAL. 180 tablet 3  . tamsulosin (FLOMAX) 0.4 MG CAPS capsule Take 0.4 mg by mouth daily after breakfast.     . predniSONE (STERAPRED UNI-PAK) 5 MG TABS tablet as needed.  0  . [DISCONTINUED] solifenacin (VESICARE) 10 MG tablet Take 10 mg by mouth daily.     No current facility-administered medications for this visit.    BP 112/58 mmHg  Pulse 60  Ht 5\' 6"  (1.676 m)  Wt 193 lb (87.544 kg)  BMI 31.17 kg/m2  SpO2 96% General: NAD Neck: No JVD, no thyromegaly or thyroid nodule.  Lungs: Clear to auscultation bilaterally with normal respiratory effort. CV: Nondisplaced PMI.  Heart regular S1/S2, no S3/S4, 2/6 early SEM RUSB.  1+ ankle edema.  No carotid bruit.  I am unable to palpate pedal pulses.   Abdomen: Soft, nontender, no hepatosplenomegaly, no distention.  Skin: Intact without lesions or rashes.  Neurologic: Alert and oriented x 3.  Psych: Normal affect. Extremities: No clubbing or cyanosis.   Assessment/Plan: 1. CAD: S/p CABG with AVR in 8/11.  No chest pain.    - Continue Crestor and ASA 81 mg daily.   2. Orthostatic hypotension: Possibly due to diabetic autonomic neuropathy.  He is not significantly symptomatic currently. 3. Hyperlipidemia: LDL is acceptable on Crestor 10 mg daily.  He was unable to tolerate 20 mg daily due to myalgias.    4. PAD: Right foot  ulcer has healed.  The tibial system did not appear to be revascularizable on prior peripheral angiography. Legs are weak but he denies claudication.   5. Bioprosthetic aortic valve: 10/15 echo showed stable bioprosthetic valve.  6. Carotid stenosis: Mild on last dopplers.  Can consider followup dopplers in 2 years.  7. Chronic systolic CHF: EF 61-44% on 11/14 echo but improved to 45-50% in 10/15.  Probably ischemic cardiomyopathy.  NYHA class II symptoms.  He is not volume overloaded on exam.  He will continue losartan, no beta blocker with bradycardia. 8. Bradycardia: Sinus bradycardia during sleep as well as type I 2nd degree AV block noted on telemetry during recent hospitalization.  Asymptomatic. He  is wearing an event monitor.  Avoid nodal blockers.  9. Anemia: Fe deficiency.  Possible small bowel AVM.  Recent workup in hospital was unrevealing.  Will check CBC today.   Followup after monitor is complete.   Loralie Champagne 11/07/2014

## 2014-11-08 NOTE — Addendum Note (Signed)
Addended by: Katrine Coho on: 11/08/2014 08:15 AM   Modules accepted: Orders

## 2014-11-12 ENCOUNTER — Ambulatory Visit: Payer: Medicare Other | Attending: Family Medicine

## 2014-11-12 DIAGNOSIS — R2681 Unsteadiness on feet: Secondary | ICD-10-CM | POA: Insufficient documentation

## 2014-11-12 DIAGNOSIS — M6281 Muscle weakness (generalized): Secondary | ICD-10-CM | POA: Diagnosis not present

## 2014-11-19 ENCOUNTER — Ambulatory Visit: Payer: Medicare Other | Attending: Family Medicine | Admitting: Physical Therapy

## 2014-11-19 DIAGNOSIS — R2681 Unsteadiness on feet: Secondary | ICD-10-CM | POA: Insufficient documentation

## 2014-11-19 DIAGNOSIS — M6281 Muscle weakness (generalized): Secondary | ICD-10-CM | POA: Diagnosis not present

## 2014-11-21 ENCOUNTER — Ambulatory Visit: Payer: Medicare Other | Admitting: Physical Therapy

## 2014-11-21 DIAGNOSIS — R2681 Unsteadiness on feet: Secondary | ICD-10-CM | POA: Diagnosis not present

## 2014-11-22 ENCOUNTER — Other Ambulatory Visit: Payer: Self-pay | Admitting: Family Medicine

## 2014-11-22 ENCOUNTER — Telehealth: Payer: Self-pay | Admitting: *Deleted

## 2014-11-22 DIAGNOSIS — D509 Iron deficiency anemia, unspecified: Secondary | ICD-10-CM

## 2014-11-22 NOTE — Telephone Encounter (Signed)
Dr Aundra Dubin reviewed monitor done 10/21/14-11/16/14  Episode of Type I 2nd degree AV block--no changes.  LMTCB

## 2014-11-22 NOTE — Telephone Encounter (Signed)
Pt.notified

## 2014-11-25 ENCOUNTER — Ambulatory Visit: Payer: Medicare Other | Admitting: Cardiology

## 2014-11-26 ENCOUNTER — Ambulatory Visit: Payer: Medicare Other | Admitting: Physical Therapy

## 2014-11-26 DIAGNOSIS — R2681 Unsteadiness on feet: Secondary | ICD-10-CM | POA: Diagnosis not present

## 2014-11-27 NOTE — Telephone Encounter (Signed)
He is feeling weak and wants to come in today to check his Hgb

## 2014-11-28 ENCOUNTER — Ambulatory Visit: Payer: Medicare Other

## 2014-11-28 ENCOUNTER — Encounter (HOSPITAL_COMMUNITY): Payer: Self-pay | Admitting: Vascular Surgery

## 2014-11-29 ENCOUNTER — Ambulatory Visit: Payer: Medicare Other | Admitting: Physical Therapy

## 2014-11-29 DIAGNOSIS — R2681 Unsteadiness on feet: Secondary | ICD-10-CM | POA: Diagnosis not present

## 2014-12-02 ENCOUNTER — Other Ambulatory Visit (HOSPITAL_BASED_OUTPATIENT_CLINIC_OR_DEPARTMENT_OTHER): Payer: Medicare Other

## 2014-12-02 DIAGNOSIS — D649 Anemia, unspecified: Secondary | ICD-10-CM

## 2014-12-02 DIAGNOSIS — D509 Iron deficiency anemia, unspecified: Secondary | ICD-10-CM

## 2014-12-02 LAB — CBC WITH DIFFERENTIAL/PLATELET
BASO%: 0.9 % (ref 0.0–2.0)
BASOS ABS: 0.1 10*3/uL (ref 0.0–0.1)
EOS ABS: 0.5 10*3/uL (ref 0.0–0.5)
EOS%: 7.8 % — ABNORMAL HIGH (ref 0.0–7.0)
HEMATOCRIT: 35.5 % — AB (ref 38.4–49.9)
HGB: 11.2 g/dL — ABNORMAL LOW (ref 13.0–17.1)
LYMPH%: 20.5 % (ref 14.0–49.0)
MCH: 28.4 pg (ref 27.2–33.4)
MCHC: 31.5 g/dL — ABNORMAL LOW (ref 32.0–36.0)
MCV: 90.4 fL (ref 79.3–98.0)
MONO#: 0.8 10*3/uL (ref 0.1–0.9)
MONO%: 11.7 % (ref 0.0–14.0)
NEUT%: 59.1 % (ref 39.0–75.0)
NEUTROS ABS: 4 10*3/uL (ref 1.5–6.5)
PLATELETS: 243 10*3/uL (ref 140–400)
RBC: 3.93 10*6/uL — ABNORMAL LOW (ref 4.20–5.82)
RDW: 19 % — ABNORMAL HIGH (ref 11.0–14.6)
WBC: 6.8 10*3/uL (ref 4.0–10.3)
lymph#: 1.4 10*3/uL (ref 0.9–3.3)

## 2014-12-02 LAB — FERRITIN CHCC: Ferritin: 304 ng/ml (ref 22–316)

## 2014-12-02 LAB — IRON AND TIBC CHCC
%SAT: 35 % (ref 20–55)
Iron: 93 ug/dL (ref 42–163)
TIBC: 266 ug/dL (ref 202–409)
UIBC: 174 ug/dL (ref 117–376)

## 2014-12-03 ENCOUNTER — Ambulatory Visit: Payer: Medicare Other | Admitting: Physical Therapy

## 2014-12-03 DIAGNOSIS — R2681 Unsteadiness on feet: Secondary | ICD-10-CM | POA: Diagnosis not present

## 2014-12-04 ENCOUNTER — Ambulatory Visit (INDEPENDENT_AMBULATORY_CARE_PROVIDER_SITE_OTHER): Payer: Medicare Other | Admitting: Cardiology

## 2014-12-04 ENCOUNTER — Encounter: Payer: Self-pay | Admitting: Cardiology

## 2014-12-04 VITALS — BP 122/80 | HR 60 | Ht 66.0 in | Wt 186.0 lb

## 2014-12-04 DIAGNOSIS — I441 Atrioventricular block, second degree: Secondary | ICD-10-CM

## 2014-12-04 DIAGNOSIS — I2510 Atherosclerotic heart disease of native coronary artery without angina pectoris: Secondary | ICD-10-CM

## 2014-12-04 DIAGNOSIS — I5022 Chronic systolic (congestive) heart failure: Secondary | ICD-10-CM

## 2014-12-04 NOTE — Patient Instructions (Signed)
Your physician wants you to follow-up in: 4 months with Dr Aundra Dubin. (April 2016).  You will receive a reminder letter in the mail two months in advance. If you don't receive a letter, please call our office to schedule the follow-up appointment.

## 2014-12-05 NOTE — Progress Notes (Signed)
Patient ID: Stanley [name], male   DOB: [birth year], 80 y.o.   MRN: [id number] PCP: Dr. Sarajane Jews  80 yo with history of PAD, CABG/AVR, diabetes, ischemic cardiomyopathy, and iron deficiency anemia presents for cardiology followup.   He has severe, unrevascularizable bilateral tibial disease.  Legs are weak but he denies significant claudication-type symptoms. He was admitted to [facility] with symptomatic anemia (dyspnea) in 10/15.  He was transfused and had EGD with enteroscopy and colonoscopy.  These tests did not show a definite cause for blood loss. While in the hospital, he was noted to be bradycardic as low as the 30s while sleeping and to have episodes of type I 2nd degree AV block.  He was asymptomatic.  30 day monitor after discharge showed an episode of type I 2nd degree AV block but no more worrisome arrhythmias.   He is doing ok symptomatically.  He took a half-mile walk this weekend without dyspnea.  He uses a walker when he is outside the house for stability.  No chest pain.  No lightheadedness or syncope.  No orthopnea or PND.  Last echo showed improvement in EF to 45-50%.    Labs (8/14): K 4.4, creatinine 0.9 Labs (11/14): K 4.2, creatinine 0.6 Labs (2/15): LDL 80, HDL 46 Labs (10/15): HCT 30.6, K 4.8, creatinine 0.78, pro-BNP 936 Labs (12/15): HCT 35.5  PMH: 1. PAD: peripheral angiography (3/13) showed severe, unreconstructable bilateral tibial artery disease.  He had a chronic ulcer on his right foot that has actually now healed.  He did lose 2 toes on his right foot.  2. Orthostatic hypotension 3. Carotid dopplers (3/14) with 0-39% bilateral stenosis.  4. OSA: Uses CPAP.  5. GERD 6. BPH 7. Type II diabetes 8. H/o HTN but not on meds for this now.  ACEI cough.  9. Anemia: Fe deficiency.  10/15 admission with anemia.  EGD and enteroscopy were unremarkable colonoscopy showed a cecal polyp and diverticulosis.  10. CAD: CABG at time of AVR in 8/11 with LIMA-LAD and SVG-PDA.  11.  Aortic stenosis: Bioprosthetic AVR in 8/11.  Pre-op echo in 3/11 showed EF 60-65% with severe AS.  Bioprosthetic aortic valve looked ok on 11/14 echo.  12. Hyperlipidemia: Myalgias with Crestor 20 mg daily but can tolerate 10 mg daily.  13. Ischemic cardiomyopathy: Echo (11/14) with EF 35-40%, mild LVH, bioprosthetic aortic valve with no significant AS or AI, mild MR, mildly dilated RV with mild to moderately decreased RV systolic function. Echo (10/15) with EF 45-50%, normal bioprosthetic aortic valve.  14. Low back pain 15. Bradycardia, Mobitz type I 2nd degree AV block.  Event monitor (11/15) showed 1 episode of type I 2nd degree AV block.   SH: Married, retired Engineer, drilling, occasional ETOH, no smoking.   FH: Type II diabetes  ROS: All systems reviewed and negative except as per HPI.   Current Outpatient Prescriptions  Medication Sig Dispense Refill  . ALPRAZolam (XANAX) 0.25 MG tablet Take 1 tablet (0.25 mg total) by mouth every 6 (six) hours as needed. 120 tablet 5  . aspirin 81 MG tablet Take 81 mg by mouth daily.    . Calcium Carbonate-Vitamin D (CALCIUM-VITAMIN D) 500-200 MG-UNIT per tablet Take 1 tablet by mouth 2 (two) times daily.     . diclofenac (VOLTAREN) 75 MG EC tablet Take 75 mg by mouth 2 (two) times daily.     . diphenoxylate-atropine (LOMOTIL) 2.5-0.025 MG per tablet TAKE 1 TABLET 4 TIMES DAILY AS NEEDED FOR DIARRHEA OR LOOSE  STOOLS. 60 tablet 3  . dutasteride (AVODART) 0.5 MG capsule Take 1 capsule (0.5 mg total) by mouth daily. 90 capsule 3  . FeFum-FePoly-FA-B Cmp-C-Biot (INTEGRA PLUS) CAPS Take 1 capsule by mouth every morning. 30 capsule 3  . glucose blood (ONE TOUCH TEST STRIPS) test strip Dispense one touch ultra, test once per day and diagnosis code is 250.00 100 each 3  . HYDROcodone-acetaminophen (NORCO) 10-325 MG per tablet Take 1 tablet by mouth every 6 (six) hours as needed for severe pain. 120 tablet 0  . Lancets (ONETOUCH ULTRASOFT) lancets TEST 3 TIMES DAILY  AS DIRECTED. 100 each PRN  . losartan (COZAAR) 25 MG tablet Take 1 tablet (25 mg total) by mouth daily. 90 tablet 3  . mirabegron ER (MYRBETRIQ) 50 MG TB24 tablet Take 50 mg by mouth daily.    . Multiple Vitamin (MULITIVITAMIN WITH MINERALS) TABS Take 1 tablet by mouth daily.    . pantoprazole (PROTONIX) 40 MG tablet Take 1 tablet (40 mg total) by mouth daily. 60 tablet 3  . predniSONE (STERAPRED UNI-PAK) 5 MG TABS tablet as needed.  0  . prochlorperazine (COMPAZINE) 10 MG tablet take 1 tablet by mouth four times a day as needed for nausea    . rosuvastatin (CRESTOR) 20 MG tablet Take 10 mg by mouth daily.     . sitaGLIPtin-metformin (JANUMET) 50-1000 MG per tablet take 1 tablet by mouth twice a day WITH A MEAL. 180 tablet 3  . tamsulosin (FLOMAX) 0.4 MG CAPS capsule Take 0.4 mg by mouth daily after breakfast.     . pioglitazone (ACTOS) 30 MG tablet 2 tablets (60mg ) by mouth daily    . [DISCONTINUED] solifenacin (VESICARE) 10 MG tablet Take 10 mg by mouth daily.     No current facility-administered medications for this visit.    BP 122/80 mmHg  Pulse 60  Ht 5\' 6"  (1.676 m)  Wt 186 lb (84.369 kg)  BMI 30.04 kg/m2 General: NAD Neck: No JVD, no thyromegaly or thyroid nodule.  Lungs: Clear to auscultation bilaterally with normal respiratory effort. CV: Nondisplaced PMI.  Heart regular S1/S2, no S3/S4, 2/6 early SEM RUSB.  No edema.  No carotid bruit.  I am unable to palpate pedal pulses.   Abdomen: Soft, nontender, no hepatosplenomegaly, no distention.  Skin: Intact without lesions or rashes.  Neurologic: Alert and oriented x 3.  Psych: Normal affect. Extremities: No clubbing or cyanosis.   Assessment/Plan: 1. CAD: S/p CABG with AVR in 8/11.  No chest pain.    - Continue Crestor and ASA 81 mg daily.   2. Orthostatic hypotension: Possibly due to diabetic autonomic neuropathy.  He is not significantly symptomatic currently. 3. Hyperlipidemia: LDL is acceptable on Crestor 10 mg daily.  He  was unable to tolerate 20 mg daily due to myalgias.    4. PAD: Right foot ulcer has healed.  The tibial system did not appear to be revascularizable on prior peripheral angiography. Legs are weak but he denies claudication.   5. Bioprosthetic aortic valve: 10/15 echo showed stable bioprosthetic valve.  6. Carotid stenosis: Mild on last dopplers.  Can consider followup dopplers in 2 years.  7. Chronic systolic CHF: EF 69-67% on 11/14 echo but improved to 45-50% in 10/15.  Probably ischemic cardiomyopathy.  NYHA class II symptoms.  He is not volume overloaded on exam.  He will continue losartan, no beta blocker with bradycardia. 8. Bradycardia: Sinus bradycardia during sleep as well as type I 2nd degree AV block noted  on telemetry during recent hospitalization.  Event monitor also showed only type I 2nd degree AV block . No indication for a pacemaker. Avoid nodal blockers.  9. Anemia: Fe deficiency.  Possible small bowel AVM.  Recent workup in hospital was unrevealing.  HCT improved on most recent CBC.   Followup 4 months.   Loralie Champagne 12/05/2014

## 2014-12-09 ENCOUNTER — Encounter: Payer: Self-pay | Admitting: Internal Medicine

## 2014-12-09 ENCOUNTER — Telehealth: Payer: Self-pay | Admitting: Internal Medicine

## 2014-12-09 ENCOUNTER — Ambulatory Visit (HOSPITAL_BASED_OUTPATIENT_CLINIC_OR_DEPARTMENT_OTHER): Payer: Medicare Other | Admitting: Internal Medicine

## 2014-12-09 VITALS — BP 134/61 | HR 74 | Temp 97.6°F | Resp 18 | Ht 66.0 in | Wt 187.4 lb

## 2014-12-09 DIAGNOSIS — D509 Iron deficiency anemia, unspecified: Secondary | ICD-10-CM

## 2014-12-09 NOTE — Progress Notes (Signed)
[place] [phone]   Fax:([phone]) [phone]  OFFICE PROGRESS NOTE  [name], MD [place] [address]  DIAGNOSIS: Iron deficiency anemia  PRIOR THERAPY: Feraheme 510 mg IV weekly 2, last dose was given on 10/18/2014  CURRENT THERAPY: Integra plus 1 capsule by mouth daily  INTERVAL HISTORY: Stanley [name] 80 y.o. male returns to the clinic today for follow-up visit. The patient is feeling fine with no specific complaints except for some persistent fatigue and shortness breath with exertion. He tolerated his previous Feraheme infusion fairly well. The patient is currently on Integra plus 1 capsule by mouth daily and also tolerating it well. He denied having any significant chest pain, cough or hemoptysis. He denied having any disease status. He has no bleeding issues. He had repeat CBC and iron study performed recently and he is here for evaluation and discussion of his lab results.  MEDICAL HISTORY: Past Medical History  Diagnosis Date  . Diabetes mellitus   . BPH (benign prostatic hyperplasia)   . Arthritis   . GERD (gastroesophageal reflux disease)   . Neuromuscular disorder   . Hypertension     no meds  . Shortness of breath     uses cpap  . 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 Failed total knee, right   . H pylori ulcer     Gastric ulcer  . CAD (coronary artery disease)  s/p CABG   . Aortic stenosis     S/p pericardial valve  . Anemia   . Mobitz type 1 second degree atrioventricular block     ALLERGIES:  is allergic to morphine and related; succinylcholine; and ace inhibitors.  MEDICATIONS:  Current Outpatient Prescriptions  Medication Sig Dispense Refill  . ALPRAZolam (XANAX) 0.25 MG tablet Take 1 tablet (0.25 mg total) by mouth every 6 (six) hours as needed. 120 tablet 5  . aspirin 81 MG tablet Take 81 mg by mouth daily.     . Calcium Carbonate-Vitamin D (CALCIUM-VITAMIN D) 500-200 MG-UNIT per tablet Take 1 tablet by mouth 2 (two) times daily.     . diclofenac (VOLTAREN) 75 MG EC tablet Take 75 mg by mouth 2 (two) times daily.     . diphenoxylate-atropine (LOMOTIL) 2.5-0.025 MG per tablet TAKE 1 TABLET 4 TIMES DAILY AS NEEDED FOR DIARRHEA OR LOOSE STOOLS. 60 tablet 3  . dutasteride (AVODART) 0.5 MG capsule Take 1 capsule (0.5 mg total) by mouth daily. 90 capsule 3  . FeFum-FePoly-FA-B Cmp-C-Biot (INTEGRA PLUS) CAPS Take 1 capsule by mouth every morning. 30 capsule 3  . glucose blood (ONE TOUCH TEST STRIPS) test strip Dispense one touch ultra, test once per day and diagnosis code is 250.00 100 each 3  . HYDROcodone-acetaminophen (NORCO) 10-325 MG per tablet Take 1 tablet by mouth every 6 (six) hours as needed for severe pain. 120 tablet 0  . Lancets (ONETOUCH ULTRASOFT) lancets TEST 3 TIMES DAILY AS DIRECTED. 100 each PRN  . losartan (COZAAR) 25 MG tablet Take 1 tablet (25 mg total) by mouth daily. 90 tablet 3  . mirabegron ER (MYRBETRIQ) 50 MG TB24 tablet Take 50 mg by mouth daily.    . Multiple Vitamin (MULITIVITAMIN WITH MINERALS) TABS Take 1 tablet by mouth daily.    . pantoprazole (PROTONIX) 40 MG tablet Take 1 tablet (40 mg total) by mouth daily. 60 tablet 3  . pioglitazone (ACTOS) 30 MG  tablet 2 tablets (60mg ) by mouth daily    . predniSONE (STERAPRED UNI-PAK) 5 MG TABS tablet as needed.  0  . prochlorperazine (COMPAZINE) 10 MG tablet take 1 tablet by mouth four times a day as needed for nausea    . rosuvastatin (CRESTOR) 20 MG tablet Take 10 mg by mouth daily.     . sitaGLIPtin-metformin (JANUMET) 50-1000 MG per tablet take 1 tablet by mouth twice a day WITH A MEAL. 180 tablet 3  . tamsulosin (FLOMAX) 0.4 MG CAPS capsule Take 0.4 mg by mouth daily after breakfast.     . [DISCONTINUED] solifenacin (VESICARE) 10 MG tablet Take 10 mg by mouth daily.     No current facility-administered medications for this  visit.    SURGICAL HISTORY:  Past Surgical History  Procedure Laterality Date  . Aortic valve replacement    . Foot arthrotomy Right   . Tonsillectomy    . Cataract extraction, bilateral    . Humerus fracture surgery    . Replacement total knee Bilateral   . Toe amputation      rt 2nd  . Cardiac catheterization    . Amputation  01/19/2012    Procedure: AMPUTATION DIGIT;  Surgeon: Colin Rhein, MD;  Location: [facility];  Service: Orthopedics;  Laterality: Right;  right great toe amputation through MTP joint  . Rotator cuff repair Right   . Enteroscopy N/A 10/15/2014    Procedure: ENTEROSCOPY;  Surgeon: Gatha Mayer, MD;  Location: [facility];  Service: Endoscopy;  Laterality: N/A;  . Colonoscopy with propofol N/A 10/17/2014    Procedure: COLONOSCOPY WITH PROPOFOL;  Surgeon: Gatha Mayer, MD;  Location: [facility];  Service: Endoscopy;  Laterality: N/A;  . Abdominal aortagram N/A 02/25/2012    Procedure: ABDOMINAL AORTAGRAM;  Surgeon: Elam Dutch, MD;  Location: [facility] CATH LAB;  Service: Cardiovascular;  Laterality: N/A;    REVIEW OF SYSTEMS:  A comprehensive review of systems was negative except for: Constitutional: positive for fatigue Respiratory: positive for dyspnea on exertion   PHYSICAL EXAMINATION: General appearance: alert, cooperative, fatigued and no distress Head: Normocephalic, without obvious abnormality, atraumatic Neck: no adenopathy, no JVD, supple, symmetrical, trachea midline and thyroid not enlarged, symmetric, no tenderness/mass/nodules Lymph nodes: Cervical, supraclavicular, and axillary nodes normal. Resp: clear to auscultation bilaterally Back: symmetric, no curvature. ROM normal. No CVA tenderness. Cardio: Normal S1 and S2 with systolic murmur GI: soft, non-tender; bowel sounds normal; no masses,  no organomegaly Extremities: extremities normal, atraumatic, no cyanosis or edema  ECOG PERFORMANCE STATUS: 2 - Symptomatic, <50%  confined to bed  Blood pressure 134/61, pulse 74, temperature 97.6 F (36.4 C), temperature source Oral, resp. rate 18, height 5\' 6"  (1.676 m), weight 187 lb 6.4 oz (85.004 kg), SpO2 100 %.  LABORATORY DATA: Lab Results  Component Value Date   WBC 6.8 12/02/2014   HGB 11.2* 12/02/2014   HCT 35.5* 12/02/2014   MCV 90.4 12/02/2014   PLT 243 12/02/2014      Chemistry      Component Value Date/Time   NA 135* 10/16/2014 0517   NA 130* 10/09/2014 1123   K 4.8 10/16/2014 0517   K 5.3* 10/09/2014 1123   CL 100 10/16/2014 0517   CO2 22 10/16/2014 0517   CO2 21* 10/09/2014 1123   BUN 18 10/16/2014 0517   BUN 31.2* 10/09/2014 1123   CREATININE 0.78 10/16/2014 0517   CREATININE 1.4* 10/09/2014 1123      Component Value Date/Time  CALCIUM 9.3 10/16/2014 0517   CALCIUM 9.3 10/09/2014 1123   ALKPHOS 73 10/14/2014 1456   ALKPHOS 70 10/09/2014 1123   AST 18 10/14/2014 1456   AST 17 10/09/2014 1123   ALT 16 10/14/2014 1456   ALT 15 10/09/2014 1123   BILITOT <0.2* 10/14/2014 1456   BILITOT 0.22 10/09/2014 1123       RADIOGRAPHIC STUDIES: No results found.  ASSESSMENT AND PLAN: This is a very pleasant 80 years old white male with iron deficiency anemia status post Feraheme infusion and currently on Integra plus 1 capsule by mouth daily and tolerating it fairly well. The patient improvement in his hemoglobin and hematocrit as well as the iron study and ferritin. I discussed the lab result with the patient today and recommended for him to continue on Integra plus for now. I will see him back for follow-up visit in 3 months with repeat CBC, iron study and ferritin. He was advised to call immediately if he has any concerning symptoms in the interval. The patient voices understanding of current disease status and treatment options and is in agreement with the current care plan.  All questions were answered. The patient knows to call the clinic with any problems, questions or concerns.  We can certainly see the patient much sooner if necessary.  Disclaimer: This note was dictated with voice recognition software. Similar sounding words can inadvertently be transcribed and may not be corrected upon review.

## 2014-12-09 NOTE — Telephone Encounter (Signed)
[facility] APPT SCHEDULE FOR MARCH 2016

## 2014-12-10 ENCOUNTER — Ambulatory Visit: Payer: Medicare Other | Admitting: Physical Therapy

## 2014-12-10 DIAGNOSIS — R2681 Unsteadiness on feet: Secondary | ICD-10-CM | POA: Diagnosis not present

## 2014-12-12 ENCOUNTER — Encounter: Payer: Self-pay | Admitting: Physical Therapy

## 2014-12-17 ENCOUNTER — Ambulatory Visit: Payer: Medicare Other | Admitting: Physical Therapy

## 2014-12-17 DIAGNOSIS — R2681 Unsteadiness on feet: Secondary | ICD-10-CM | POA: Diagnosis not present

## 2014-12-19 ENCOUNTER — Ambulatory Visit: Payer: Medicare Other | Admitting: Physical Therapy

## 2014-12-19 DIAGNOSIS — R2681 Unsteadiness on feet: Secondary | ICD-10-CM | POA: Diagnosis not present

## 2014-12-24 ENCOUNTER — Ambulatory Visit: Payer: Medicare Other | Attending: Family Medicine | Admitting: Physical Therapy

## 2014-12-24 ENCOUNTER — Other Ambulatory Visit: Payer: Self-pay

## 2014-12-24 DIAGNOSIS — R2681 Unsteadiness on feet: Secondary | ICD-10-CM | POA: Insufficient documentation

## 2014-12-24 DIAGNOSIS — M6281 Muscle weakness (generalized): Secondary | ICD-10-CM | POA: Diagnosis not present

## 2014-12-24 MED ORDER — GLUCOSE BLOOD VI STRP: ORAL_STRIP | Status: DC

## 2014-12-24 NOTE — Telephone Encounter (Signed)
Rx request for One Touch Ultra Test Strips- Test TID #100.    Pharmacy:  [facility].

## 2014-12-26 ENCOUNTER — Ambulatory Visit: Payer: Medicare Other | Admitting: Physical Therapy

## 2014-12-26 DIAGNOSIS — R2681 Unsteadiness on feet: Secondary | ICD-10-CM | POA: Diagnosis not present

## 2014-12-27 ENCOUNTER — Other Ambulatory Visit: Payer: Self-pay | Admitting: Internal Medicine

## 2014-12-31 ENCOUNTER — Ambulatory Visit: Payer: Medicare Other

## 2015-01-01 ENCOUNTER — Ambulatory Visit: Payer: Medicare Other

## 2015-01-01 DIAGNOSIS — R2681 Unsteadiness on feet: Secondary | ICD-10-CM | POA: Diagnosis not present

## 2015-01-02 ENCOUNTER — Ambulatory Visit: Payer: Medicare Other | Admitting: Physical Therapy

## 2015-01-02 ENCOUNTER — Other Ambulatory Visit: Payer: Self-pay | Admitting: *Deleted

## 2015-01-02 DIAGNOSIS — R2681 Unsteadiness on feet: Secondary | ICD-10-CM | POA: Diagnosis not present

## 2015-01-02 MED ORDER — PIOGLITAZONE HCL 30 MG PO TABS: ORAL_TABLET | ORAL | Status: DC

## 2015-01-07 ENCOUNTER — Ambulatory Visit: Payer: Medicare Other | Admitting: Physical Therapy

## 2015-01-07 DIAGNOSIS — R2681 Unsteadiness on feet: Secondary | ICD-10-CM | POA: Diagnosis not present

## 2015-01-09 ENCOUNTER — Ambulatory Visit: Payer: Medicare Other

## 2015-01-09 DIAGNOSIS — R2681 Unsteadiness on feet: Secondary | ICD-10-CM | POA: Diagnosis not present

## 2015-01-13 ENCOUNTER — Telehealth: Payer: Self-pay | Admitting: Family Medicine

## 2015-01-13 ENCOUNTER — Telehealth: Payer: Self-pay | Admitting: Physician Assistant

## 2015-01-13 ENCOUNTER — Other Ambulatory Visit: Payer: Self-pay | Admitting: *Deleted

## 2015-01-13 MED ORDER — PANTOPRAZOLE SODIUM 40 MG PO TBEC: 40.0000 mg | DELAYED_RELEASE_TABLET | Freq: Two times a day (BID) | ORAL | Status: DC

## 2015-01-13 MED ORDER — PIOGLITAZONE HCL 45 MG PO TABS: 45.0000 mg | ORAL_TABLET | Freq: Every day | ORAL | Status: DC

## 2015-01-13 NOTE — Telephone Encounter (Signed)
done

## 2015-01-13 NOTE — Telephone Encounter (Signed)
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  He takes it twice daily. So Amy had me send to [facility] a prescription for twice daily with 11 refills for Chronic GERD.

## 2015-01-14 ENCOUNTER — Ambulatory Visit: Payer: Medicare Other | Admitting: Physical Therapy

## 2015-01-14 ENCOUNTER — Ambulatory Visit: Payer: Medicare Other

## 2015-01-14 NOTE — Telephone Encounter (Signed)
Yes thanks 

## 2015-01-16 ENCOUNTER — Ambulatory Visit: Payer: Medicare Other | Admitting: Physical Therapy

## 2015-01-16 DIAGNOSIS — R2681 Unsteadiness on feet: Secondary | ICD-10-CM | POA: Diagnosis not present

## 2015-01-21 ENCOUNTER — Ambulatory Visit: Payer: Medicare Other | Attending: Family Medicine | Admitting: Physical Therapy

## 2015-01-21 DIAGNOSIS — R2681 Unsteadiness on feet: Secondary | ICD-10-CM | POA: Diagnosis present

## 2015-01-21 DIAGNOSIS — M6281 Muscle weakness (generalized): Secondary | ICD-10-CM | POA: Diagnosis not present

## 2015-01-23 ENCOUNTER — Ambulatory Visit: Payer: Medicare Other | Admitting: Physical Therapy

## 2015-01-23 DIAGNOSIS — R2681 Unsteadiness on feet: Secondary | ICD-10-CM | POA: Diagnosis not present

## 2015-01-28 ENCOUNTER — Ambulatory Visit: Payer: Medicare Other | Admitting: Physical Therapy

## 2015-01-28 ENCOUNTER — Ambulatory Visit (INDEPENDENT_AMBULATORY_CARE_PROVIDER_SITE_OTHER): Payer: Medicare Other | Admitting: Family Medicine

## 2015-01-28 ENCOUNTER — Encounter: Payer: Self-pay | Admitting: Family Medicine

## 2015-01-28 VITALS — BP 113/63 | HR 72 | Temp 97.9°F | Ht 66.0 in | Wt 189.0 lb

## 2015-01-28 DIAGNOSIS — E119 Type 2 diabetes mellitus without complications: Secondary | ICD-10-CM

## 2015-01-28 DIAGNOSIS — R251 Tremor, unspecified: Secondary | ICD-10-CM

## 2015-01-28 DIAGNOSIS — R2681 Unsteadiness on feet: Secondary | ICD-10-CM | POA: Diagnosis not present

## 2015-01-28 DIAGNOSIS — I10 Essential (primary) hypertension: Secondary | ICD-10-CM

## 2015-01-28 DIAGNOSIS — I5022 Chronic systolic (congestive) heart failure: Secondary | ICD-10-CM

## 2015-01-28 MED ORDER — HYDROCODONE-ACETAMINOPHEN 10-325 MG PO TABS: 1.0000 | ORAL_TABLET | Freq: Four times a day (QID) | ORAL | Status: DC | PRN

## 2015-01-28 MED ORDER — PRAMIPEXOLE DIHYDROCHLORIDE 0.5 MG PO TABS: 1.5000 mg | ORAL_TABLET | Freq: Every day | ORAL | Status: DC

## 2015-01-29 ENCOUNTER — Encounter: Payer: Self-pay | Admitting: Family Medicine

## 2015-01-29 DIAGNOSIS — R251 Tremor, unspecified: Secondary | ICD-10-CM | POA: Insufficient documentation

## 2015-01-29 NOTE — Progress Notes (Signed)
   Subjective:    Patient ID: Stanley [name], male    DOB: [birth year], 81 y.o.   MRN: [id number]  HPI Here to discuss his tremors which are slowly getting worse. He has had resting tremors of both hands for several years, but in the past 6 months these have worsened to the point that they interfere with eating and other daily activities. He has been going to PT for gait training and this has helped. His glucoses at home are stable in the range of 110-130 fasting.    Review of Systems  Constitutional: Negative.   Respiratory: Negative.   Cardiovascular: Negative.   Neurological: Positive for tremors. Negative for dizziness, seizures, syncope, facial asymmetry, speech difficulty, weakness, light-headedness, numbness and headaches.       Objective:   Physical Exam  Constitutional: He is oriented to person, place, and time.  Alert but somewhat frail, using a rolling walker  Cardiovascular: Normal rate, regular rhythm, normal heart sounds and intact distal pulses.   Pulmonary/Chest: Effort normal and breath sounds normal.  Neurological: He is alert and oriented to person, place, and time.  Resting tremors of both hands and to a lesser extent the head and trunk          Assessment & Plan:  He tried a few beta blockers in the past but did not tolerate them due to weakness and SOB. We will try him on Mirapex, to begin with 0.5 mg at bedtime. He will take one at bedtime for one week, then increase to 2 qhs for one week, then increase to 3 qhs. He will then follow up with me. We may try dosing this BID or TID depending on his response.

## 2015-01-30 ENCOUNTER — Ambulatory Visit: Payer: Medicare Other | Admitting: Physical Therapy

## 2015-01-30 DIAGNOSIS — R2681 Unsteadiness on feet: Secondary | ICD-10-CM | POA: Diagnosis not present

## 2015-02-04 ENCOUNTER — Encounter: Payer: Self-pay | Admitting: Physical Therapy

## 2015-02-04 ENCOUNTER — Ambulatory Visit: Payer: Medicare Other | Admitting: Physical Therapy

## 2015-02-04 DIAGNOSIS — R2681 Unsteadiness on feet: Secondary | ICD-10-CM

## 2015-02-04 NOTE — Therapy (Signed)
[facility] [address] [place], [place], [place], [address] Phone: [phone]   Fax:  [phone]  Physical Therapy Treatment  Patient Details  Name: Stanley [name] MRN: [id number] Date of Birth: 05-09-1925 Referring Provider:  [name], MD  Encounter Date: 02/04/2015      PT End of Session - 02/04/15 1457    Visit Number 21   Number of Visits 30   Date for PT Re-Evaluation 02/28/15   PT Start Time 0200   PT Stop Time 0255   PT Time Calculation (min) 55 min   Equipment Utilized During Treatment Gait belt   Activity Tolerance Patient tolerated treatment well   Behavior During Therapy [facility] for tasks assessed/performed      Past Medical History  Diagnosis Date  . Diabetes mellitus   . BPH (benign prostatic hyperplasia)   . Arthritis   . GERD (gastroesophageal reflux disease)   . Neuromuscular disorder   . Hypertension     no meds  . Shortness of breath     uses cpap  . 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 Failed total knee, right   . H pylori ulcer     Gastric ulcer  . CAD (coronary artery disease)  s/p CABG   . Aortic stenosis     S/p pericardial valve  . Anemia   . Mobitz type 1 second degree atrioventricular block     Past Surgical History  Procedure Laterality Date  . Aortic valve replacement    . Foot arthrotomy Right   . Tonsillectomy    . Cataract extraction, bilateral    . Humerus fracture surgery    . Replacement total knee Bilateral   . Toe amputation      rt 2nd  . Cardiac catheterization    . Amputation  01/19/2012    Procedure: AMPUTATION DIGIT;  Surgeon: Colin Rhein, MD;  Location: [place];  Service: Orthopedics;  Laterality: Right;  right great toe amputation through MTP joint  . Rotator cuff repair Right   . Enteroscopy N/A 10/15/2014    Procedure: ENTEROSCOPY;  Surgeon: Gatha Mayer, MD;  Location: [facility];  Service: Endoscopy;  Laterality: N/A;  . Colonoscopy with propofol N/A 10/17/2014    Procedure: COLONOSCOPY WITH PROPOFOL;  Surgeon: Gatha Mayer, MD;  Location: [facility];  Service: Endoscopy;  Laterality: N/A;  . Abdominal aortagram N/A 02/25/2012    Procedure: ABDOMINAL AORTAGRAM;  Surgeon: Elam Dutch, MD;  Location: [facility] C[facility] LAB;  Service: Cardiovascular;  Laterality: N/A;    There were no vitals taken for this visit.  Visit Diagnosis:  Unsteady gait      Subjective Assessment - 02/04/15 1414    Symptoms Patient is feeling stronger.  Patient is seeing physician for back pain. Patient feels the back pain can be affecting his walking.    Limitations Walking   How long can you walk comfortably? 1 1/2 minutes   Patient Stated Goals walk and improve balance   Currently in Pain? Yes   Pain Score 4    Pain Location Back   Pain Descriptors / Indicators Aching   Pain Type Chronic pain   Pain Radiating Towards None   Pain Onset More than a month ago   Pain Frequency Intermittent   Aggravating Factors  activity   Pain Relieving Factors sitting   Effect of Pain on Daily Activities makes  it difficult to walk   Multiple Pain Sites No                    [facility] Adult PT Treatment/Exercise - 02/04/15 0001    High Level Balance   High Level Balance Activities Negotiating over obstacles;Side stepping  Walk over weights then onto the oval 4 times with CG;    High Level Balance Comments --  side step over ladder with cg.    Exercises   Exercises Shoulder;Ankle  shoulder rows in chair 30 pounds   Ankle Exercises: Standing   Rebounder standing weight shift 3 ways 1 minute each   Other Standing Ankle Exercises stand in corner while therapist pushes on shoulders and hips   Other Standing Ankle Exercises stand place foot on cone in different directions with CG                PT Education - 02/04/15 1457    Education provided  No          PT Short Term Goals - 02/04/15 1500    PT SHORT TERM GOAL #1   Title be independent with initial HEP   Time 8   Period Weeks   Status Achieved   PT SHORT TERM GOAL #2   Title perform sit to stand with moderate UE support   Time 8   Period Weeks   Status Achieved   PT SHORT TERM GOAL #3   Title reduce TUG to < or = to 22 seconds   Time 8   Period Weeks   Status Achieved           PT Long Term Goals - 02/04/15 1501    PT LONG TERM GOAL #1   Title demonstrate and/or verbalize techniques to reduce the risk of re-injury to include info on fall prevention   Time 8   Period Weeks   Status Achieved   PT LONG TERM GOAL #2   Title be independent with advanced HEP   Time 8   Period Weeks   Status New   PT LONG TERM GOAL #3   Title perform sit to stand with moderate to minimal UE support   Time 8   Status New   PT LONG TERM GOAL #4   Title Perform TUG in < or = to 19 seconds   Time 8   Period Weeks   Status Achieved   PT LONG TERM GOAL #5   Title improved strength reports 50% lesss fatique with walking dog   Time 8   Period Weeks   Status Partially Met   Additional Long Term Goals   Additional Long Term Goals Yes   PT LONG TERM GOAL #6   Title wean from walker/cane 25% of the time for home distances   Time 8   Status New               Plan - 02/04/15 1458    Clinical Impression Statement Patient was steadier on his feet.  Patient has decreased balance and lower extremity weakness   Pt will benefit from skilled therapeutic intervention in order to improve on the following deficits Abnormal gait;Difficulty walking;Decreased activity tolerance;Decreased balance   Rehab Potential Good   Clinical Impairments Affecting Rehab Potential None   PT Frequency 2x / week   PT Duration 8 weeks   PT Treatment/Interventions Therapeutic exercise;Balance training;Neuromuscular re-education   PT Next Visit Plan balance exercises, endurance exercises; review  HEP  Consulted and Agree with Plan of Care Patient        Problem List Patient Active Problem List   Diagnosis Date Noted  . Tremors of nervous system 01/29/2015  . Bradycardia 11/07/2014  . Colon polyp 10/17/2014  . Mobitz type 1 second degree atrioventricular block   . Heme + stool 10/15/2014  . Symptomatic anemia 10/14/2014  . Dyspnea on exertion 10/14/2014  . Unsteady gait 10/02/2014  . Chronic systolic CHF (congestive heart failure) 05/12/2014  . Orthostatic lightheadedness 03/19/2013  . First degree AV block 07/25/2012  . Atherosclerosis of native arteries of the extremities with ulceration 02/17/2012  . HYPOTENSION, UNSPECIFIED 10/08/2010  . SECOND DEGREE AV HEART BLOCK, MOBITZ I 09/04/2010  . S/P AVR 09/01/2010  . CAD, NATIVE VESSEL 06/24/2010  . Diabetes mellitus without complication 96/72/2773  . CAD (coronary artery disease) of artery bypass graft 02/16/2010  . OBSTRUCTIVE SLEEP APNEA 07/21/2009  . Hypothyroidism 12/07/2007  . HTN (hypertension) 12/07/2007  . Iron deficiency anemia 12/07/2007  . ELEVATED PROSTATE SPECIFIC ANTIGEN 12/07/2007   Patient able to perform balance activities without losing his balance for first time.   Niel Peretti, PT 02/04/2015, 3:06 PM  [facility] [address] [place], [place], [place], [address] Phone: [phone]   Fax:  [phone]

## 2015-02-06 ENCOUNTER — Encounter: Payer: Self-pay | Admitting: Physical Therapy

## 2015-02-06 ENCOUNTER — Ambulatory Visit: Payer: Medicare Other | Admitting: Physical Therapy

## 2015-02-06 DIAGNOSIS — R2681 Unsteadiness on feet: Secondary | ICD-10-CM | POA: Diagnosis not present

## 2015-02-06 DIAGNOSIS — R29898 Other symptoms and signs involving the musculoskeletal system: Secondary | ICD-10-CM

## 2015-02-06 NOTE — Therapy (Signed)
[facility] [address] [place], [place], [place], [address] Phone: [phone]   Fax:  [phone]  Physical Therapy Treatment  Patient Details  Name: Stanley [name] MRN: [id number] Date of Birth: May 23, 1925 Referring Provider:  [name], MD  Encounter Date: 02/06/2015      PT End of Session - 02/06/15 1514    Visit Number 22   Number of Visits 30   Date for PT Re-Evaluation 02/28/15   PT Start Time 1400   PT Stop Time 1455   PT Time Calculation (min) 55 min   Activity Tolerance Patient tolerated treatment well   Behavior During Therapy [facility] for tasks assessed/performed      Past Medical History  Diagnosis Date  . Diabetes mellitus   . BPH (benign prostatic hyperplasia)   . Arthritis   . GERD (gastroesophageal reflux disease)   . Neuromuscular disorder   . Hypertension     no meds  . Shortness of breath     uses cpap  . 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 Failed total knee, right   . H pylori ulcer     Gastric ulcer  . CAD (coronary artery disease)  s/p CABG   . Aortic stenosis     S/p pericardial valve  . Anemia   . Mobitz type 1 second degree atrioventricular block     Past Surgical History  Procedure Laterality Date  . Aortic valve replacement    . Foot arthrotomy Right   . Tonsillectomy    . Cataract extraction, bilateral    . Humerus fracture surgery    . Replacement total knee Bilateral   . Toe amputation      rt 2nd  . Cardiac catheterization    . Amputation  01/19/2012    Procedure: AMPUTATION DIGIT;  Surgeon: Colin Rhein, MD;  Location: [place];  Service: Orthopedics;  Laterality: Right;  right great toe amputation through MTP joint  . Rotator cuff repair Right   . Enteroscopy N/A 10/15/2014    Procedure: ENTEROSCOPY;  Surgeon: Gatha Mayer, MD;  Location: [facility];  Service:  Endoscopy;  Laterality: N/A;  . Colonoscopy with propofol N/A 10/17/2014    Procedure: COLONOSCOPY WITH PROPOFOL;  Surgeon: Gatha Mayer, MD;  Location: [facility];  Service: Endoscopy;  Laterality: N/A;  . Abdominal aortagram N/A 02/25/2012    Procedure: ABDOMINAL AORTAGRAM;  Surgeon: Elam Dutch, MD;  Location: [facility] CATH LAB;  Service: Cardiovascular;  Laterality: N/A;    There were no vitals taken for this visit.  Visit Diagnosis:  Unsteady gait  Weakness of both legs      Subjective Assessment - 02/06/15 1457    Symptoms Patient consulted MD Geoffry for back pain - continue doing what your are doing-   Limitations Walking;Standing   How long can you walk comfortably? 1 to 2 min   Patient Stated Goals walk and improve balance   Currently in Pain? Yes   Pain Score 4    Pain Location Back   Pain Orientation Mid   Pain Descriptors / Indicators Aching   Pain Type Chronic pain   Pain Onset More than a month ago   Pain Frequency Intermittent   Aggravating Factors  activity   Pain Relieving Factors sitting   Effect of Pain on Daily Activities makes it difficult to walk   Multiple Pain Sites No  [facility] Adult PT Treatment/Exercise - 02/06/15 0001    Ambulation/Gait   Ambulation/Gait Yes   Ambulation Distance (Feet) 160 Feet  x 2 challenging with turns   Assistive device Straight cane   Gait Pattern Step-through pattern   Ambulation Surface Level   Gait velocity slow   Exercises   Exercises Knee/Hip   Knee/Hip Exercises: Aerobic   Stationary Bike 10 level 1   Knee/Hip Exercises: Standing   Rebounder standing 3 directions x 1 min   Other Standing Knee Exercises standing on blue balance pad x 1 min  with close S to min A to maintain balance - challenging for    Knee/Hip Exercises: Seated   Long Arc Quad Strengthening;Both;3 sets;10 reps  30   Long Arc Quad Weight 3 lbs.   Shoulder Exercises: Standing   Flexion AROM  walladder in  standing for balance in standing and ROM left U   Shoulder Exercises: ROM/Strengthening   Other ROM/Strengthening Exercises sitting 35# x 30, x 20 bil                  PT Short Term Goals - 02/04/15 1500    PT SHORT TERM GOAL #1   Title be independent with initial HEP   Time 8   Period Weeks   Status Achieved   PT SHORT TERM GOAL #2   Title perform sit to stand with moderate UE support   Time 8   Period Weeks   Status Achieved   PT SHORT TERM GOAL #3   Title reduce TUG to < or = to 22 seconds   Time 8   Period Weeks   Status Achieved           PT Long Term Goals - 02/04/15 1501    PT LONG TERM GOAL #1   Title demonstrate and/or verbalize techniques to reduce the risk of re-injury to include info on fall prevention   Time 8   Period Weeks   Status Achieved   PT LONG TERM GOAL #2   Title be independent with advanced HEP   Time 8   Period Weeks   Status New   PT LONG TERM GOAL #3   Title perform sit to stand with moderate to minimal UE support   Time 8   Status New   PT LONG TERM GOAL #4   Title Perform TUG in < or = to 19 seconds   Time 8   Period Weeks   Status Achieved   PT LONG TERM GOAL #5   Title improved strength reports 50% lesss fatique with walking dog   Time 8   Period Weeks   Status Partially Met   Additional Long Term Goals   Additional Long Term Goals Yes   PT LONG TERM GOAL #6   Title wean from walker/cane 25% of the time for home distances   Time 8   Status New               Plan - 02/06/15 1515    Clinical Impression Statement Patient with improved posture with ambulation. decresed balance with ambulation esspecially on uneven surface    Pt will benefit from skilled therapeutic intervention in order to improve on the following deficits Abnormal gait;Difficulty walking;Decreased activity tolerance;Decreased balance   Rehab Potential Good   Clinical Impairments Affecting Rehab Potential None   PT Frequency 2x / week   PT  Duration 8 weeks   PT Treatment/Interventions Therapeutic exercise;Balance training;Neuromuscular re-education  PT Next Visit Plan balance exercises, endurance exercises; review HEP   Consulted and Agree with Plan of Care Patient        Problem List Patient Active Problem List   Diagnosis Date Noted  . Tremors of nervous system 01/29/2015  . Bradycardia 11/07/2014  . Colon polyp 10/17/2014  . Mobitz type 1 second degree atrioventricular block   . Heme + stool 10/15/2014  . Symptomatic anemia 10/14/2014  . Dyspnea on exertion 10/14/2014  . Unsteady gait 10/02/2014  . Chronic systolic CHF (congestive heart failure) 05/12/2014  . Orthostatic lightheadedness 03/19/2013  . First degree AV block 07/25/2012  . Atherosclerosis of native arteries of the extremities with ulceration 02/17/2012  . HYPOTENSION, UNSPECIFIED 10/08/2010  . SECOND DEGREE AV HEART BLOCK, MOBITZ I 09/04/2010  . S/P AVR 09/01/2010  . CAD, NATIVE VESSEL 06/24/2010  . Diabetes mellitus without complication 84/02/3532  . CAD (coronary artery disease) of artery bypass graft 02/16/2010  . OBSTRUCTIVE SLEEP APNEA 07/21/2009  . Hypothyroidism 12/07/2007  . HTN (hypertension) 12/07/2007  . Iron deficiency anemia 12/07/2007  . ELEVATED PROSTATE SPECIFIC ANTIGEN 12/07/2007    NAUMANN-HOUEGNIFIO,Chrystian Cupples PTA 02/06/2015, 3:25 PM  [facility] [address] [place], [place], [place], [address] Phone: [phone]   Fax:  [phone]

## 2015-02-07 ENCOUNTER — Telehealth: Payer: Self-pay | Admitting: *Deleted

## 2015-02-07 ENCOUNTER — Other Ambulatory Visit (INDEPENDENT_AMBULATORY_CARE_PROVIDER_SITE_OTHER): Payer: Medicare Other

## 2015-02-07 ENCOUNTER — Telehealth: Payer: Self-pay | Admitting: Family Medicine

## 2015-02-07 DIAGNOSIS — D649 Anemia, unspecified: Secondary | ICD-10-CM

## 2015-02-07 DIAGNOSIS — E119 Type 2 diabetes mellitus without complications: Secondary | ICD-10-CM

## 2015-02-07 DIAGNOSIS — R7309 Other abnormal glucose: Secondary | ICD-10-CM

## 2015-02-07 DIAGNOSIS — R739 Hyperglycemia, unspecified: Secondary | ICD-10-CM

## 2015-02-07 LAB — CBC WITH DIFFERENTIAL/PLATELET
Basophils Absolute: 0 10*3/uL (ref 0.0–0.1)
Basophils Relative: 0.3 % (ref 0.0–3.0)
EOS ABS: 0.1 10*3/uL (ref 0.0–0.7)
Eosinophils Relative: 1.6 % (ref 0.0–5.0)
HCT: 31.8 % — ABNORMAL LOW (ref 39.0–52.0)
HEMOGLOBIN: 10.7 g/dL — AB (ref 13.0–17.0)
Lymphocytes Relative: 17.8 % (ref 12.0–46.0)
Lymphs Abs: 1.6 10*3/uL (ref 0.7–4.0)
MCHC: 33.5 g/dL (ref 30.0–36.0)
MCV: 93.6 fl (ref 78.0–100.0)
MONO ABS: 0.8 10*3/uL (ref 0.1–1.0)
Monocytes Relative: 9.6 % (ref 3.0–12.0)
NEUTROS ABS: 6.2 10*3/uL (ref 1.4–7.7)
Neutrophils Relative %: 70.7 % (ref 43.0–77.0)
Platelets: 210 10*3/uL (ref 150.0–400.0)
RBC: 3.4 Mil/uL — AB (ref 4.22–5.81)
RDW: 13.9 % (ref 11.5–15.5)
WBC: 8.8 10*3/uL (ref 4.0–10.5)

## 2015-02-07 NOTE — Telephone Encounter (Signed)
Feeling weak lately

## 2015-02-07 NOTE — Telephone Encounter (Signed)
Pt called left msg stating he feels anemic and wants to be seen earlier.  Attempted to call pt back, no answer, left msg to give [place] a call back.

## 2015-02-10 ENCOUNTER — Telehealth: Payer: Self-pay | Admitting: *Deleted

## 2015-02-10 DIAGNOSIS — D509 Iron deficiency anemia, unspecified: Secondary | ICD-10-CM

## 2015-02-10 NOTE — Telephone Encounter (Signed)
Mr. Stanley [name] called and wants to be seen sooner than 03/10/15.  He is more fatigued, has shortness of breath.  He had a CBC at his primary care office on Friday and reports the Hgb was 10.7g/dL.  Made him an appointment for a CBC and visit with Awilda Metro, PA on 02/18/15.  He was very pleased to be able to get in sooner.  We did not cancel his appointment with Dr. Julien Nordmann for 03/10/15 at this time.

## 2015-02-17 ENCOUNTER — Ambulatory Visit: Payer: Medicare Other

## 2015-02-17 DIAGNOSIS — R2681 Unsteadiness on feet: Secondary | ICD-10-CM

## 2015-02-17 DIAGNOSIS — R29898 Other symptoms and signs involving the musculoskeletal system: Secondary | ICD-10-CM

## 2015-02-17 NOTE — Therapy (Signed)
[facility] [address] [place], [place], [address] Phone: [phone]   Fax:  [phone]  Physical Therapy Treatment  Patient Details  Name: Stanley [name] MRN: [id number] Date of Birth: 1925-09-24 Referring Provider:  [name], MD  Encounter Date: 02/17/2015      PT End of Session - 02/17/15 1441    Visit Number 23   Number of Visits --  30 Medicare   Date for PT Re-Evaluation 02/28/15   PT Start Time 1401   PT Stop Time 1443   PT Time Calculation (min) 42 min   Activity Tolerance Patient tolerated treatment well   Behavior During Therapy [facility] for tasks assessed/performed      Past Medical History  Diagnosis Date  . Diabetes mellitus   . BPH (benign prostatic hyperplasia)   . Arthritis   . GERD (gastroesophageal reflux disease)   . Neuromuscular disorder   . Hypertension     no meds  . Shortness of breath     uses cpap  . 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 Failed total knee, right   . H pylori ulcer     Gastric ulcer  . CAD (coronary artery disease)  s/p CABG   . Aortic stenosis     S/p pericardial valve  . Anemia   . Mobitz type 1 second degree atrioventricular block     Past Surgical History  Procedure Laterality Date  . Aortic valve replacement    . Foot arthrotomy Right   . Tonsillectomy    . Cataract extraction, bilateral    . Humerus fracture surgery    . Replacement total knee Bilateral   . Toe amputation      rt 2nd  . Cardiac catheterization    . Amputation  01/19/2012    Procedure: AMPUTATION DIGIT;  Surgeon: Colin Rhein, MD;  Location: [place];  Service: Orthopedics;  Laterality: Right;  right great toe amputation through MTP joint  . Rotator cuff repair Right   . Enteroscopy N/A 10/15/2014    Procedure: ENTEROSCOPY;  Surgeon: Gatha Mayer, MD;  Location[facility];  Service: Endoscopy;  Laterality: N/A;  . Colonoscopy with propofol N/A 10/17/2014    Procedure: COLONOSCOPY WITH PROPOFOL;  Surgeon: Gatha Mayer, MD;  Location: [facility];  Service: Endoscopy;  Laterality: N/A;  . Abdominal aortagram N/A 02/25/2012    Procedure: ABDOMINAL AORTAGRAM;  Surgeon: Elam Dutch, MD;  Location: [facility] CATH LAB;  Service: Cardiovascular;  Laterality: N/A;    There were no vitals taken for this visit.  Visit Diagnosis:  Unsteady gait  Weakness of both legs      Subjective Assessment - 02/17/15 1403    Symptoms Pt reports that he is still feeling fatigue.  Pt worked on [employer] last week when he didn't attend PT.   Patient Stated Goals walk and improve balance   Currently in Pain? Yes   Pain Score 4    Pain Location Back   Pain Orientation Right;Left;Lower   Pain Descriptors / Indicators Aching   Pain Type Chronic pain   Pain Onset More than a month ago   Pain Frequency Intermittent   Aggravating Factors  standing and walking   Pain Relieving Factors sitting, medicine   Multiple Pain Sites No  [facility] Adult PT Treatment/Exercise - 02/17/15 0001    Knee/Hip Exercises: Aerobic   Stationary Bike Level 2 x 10 minutes   Knee/Hip Exercises: Standing   Other Standing Knee Exercises standing on blue balance pad x 1 min  with close S to min A to maintain balance - challenging for    Other Standing Knee Exercises mini tramp 3 ways x 1 minute each   Knee/Hip Exercises: Seated   Long Arc Quad Strengthening;Both;3 sets;10 reps  30   Long Arc Quad Weight 3 lbs.   Other Seated Knee Exercises seated marching 3# 3x10   Shoulder Exercises: Standing   Other Standing Exercises cone stacking to 1st self to challenge endurance for standing and balance 2x2 minutes   Shoulder Exercises: ROM/Strengthening   Other ROM/Strengthening Exercises sitting 35# 3x10 performed seated                  PT Short Term Goals -  02/04/15 1500    PT SHORT TERM GOAL #1   Title be independent with initial HEP   Time 8   Period Weeks   Status Achieved   PT SHORT TERM GOAL #2   Title perform sit to stand with moderate UE support   Time 8   Period Weeks   Status Achieved   PT SHORT TERM GOAL #3   Title reduce TUG to < or = to 22 seconds   Time 8   Period Weeks   Status Achieved           PT Long Term Goals - 02/17/15 1405    PT LONG TERM GOAL #1   Title demonstrate and/or verbalize techniques to reduce the risk of re-injury to include info on fall prevention   Status Achieved   PT LONG TERM GOAL #2   Title be independent with advanced HEP   Time 8   Period Weeks   Status On-going  Pt independent and compliant in current HEP   PT LONG TERM GOAL #3   Title perform sit to stand with moderate to minimal UE support   Time 8   Period Weeks   Status On-going  moderate to max support required   PT LONG TERM GOAL #4   Title Perform TUG in < or = to 19 seconds   Status Achieved               Plan - 02/17/15 1442    Clinical Impression Statement Pt with continued endurance and strength deficits.  Pt tolerated new activity well and is limited to standing activity due to low back pain.   Pt will benefit from skilled therapeutic intervention in order to improve on the following deficits Abnormal gait;Difficulty walking;Decreased activity tolerance;Decreased balance   Rehab Potential Good   PT Frequency 2x / week   PT Duration 8 weeks   PT Treatment/Interventions Therapeutic exercise;Balance training;Neuromuscular re-education   PT Next Visit Plan balance exercises, endurance exercises, UE/LE strength   Consulted and Agree with Plan of Care Patient        Problem List Patient Active Problem List   Diagnosis Date Noted  . Tremors of nervous system 01/29/2015  . Bradycardia 11/07/2014  . Colon polyp 10/17/2014  . Mobitz type 1 second degree atrioventricular block   . Heme + stool 10/15/2014   . Symptomatic anemia 10/14/2014  . Dyspnea on exertion 10/14/2014  . Unsteady gait 10/02/2014  . Chronic systolic CHF (congestive heart failure) 05/12/2014  . Orthostatic lightheadedness 03/19/2013  .  First degree AV block 07/25/2012  . Atherosclerosis of native arteries of the extremities with ulceration 02/17/2012  . HYPOTENSION, UNSPECIFIED 10/08/2010  . SECOND DEGREE AV HEART BLOCK, MOBITZ I 09/04/2010  . S/P AVR 09/01/2010  . CAD, NATIVE VESSEL 06/24/2010  . Diabetes mellitus without complication 11/91/4782  . CAD (coronary artery disease) of artery bypass graft 02/16/2010  . OBSTRUCTIVE SLEEP APNEA 07/21/2009  . Hypothyroidism 12/07/2007  . HTN (hypertension) 12/07/2007  . Iron deficiency anemia 12/07/2007  . ELEVATED PROSTATE SPECIFIC ANTIGEN 12/07/2007    TAKACS,KELLY, PT 02/17/2015, 2:46 PM  [facility] [address] [place], [place], [address] Phone: [phone]   Fax:  [phone]

## 2015-02-18 ENCOUNTER — Encounter: Payer: Self-pay | Admitting: Physician Assistant

## 2015-02-18 ENCOUNTER — Ambulatory Visit (HOSPITAL_BASED_OUTPATIENT_CLINIC_OR_DEPARTMENT_OTHER): Payer: Medicare Other | Admitting: Physician Assistant

## 2015-02-18 ENCOUNTER — Ambulatory Visit: Payer: Medicare Other

## 2015-02-18 ENCOUNTER — Other Ambulatory Visit (HOSPITAL_BASED_OUTPATIENT_CLINIC_OR_DEPARTMENT_OTHER): Payer: Medicare Other

## 2015-02-18 VITALS — BP 114/45 | HR 66 | Temp 98.0°F | Resp 18 | Ht 66.0 in | Wt 186.3 lb

## 2015-02-18 DIAGNOSIS — D509 Iron deficiency anemia, unspecified: Secondary | ICD-10-CM

## 2015-02-18 DIAGNOSIS — D638 Anemia in other chronic diseases classified elsewhere: Secondary | ICD-10-CM | POA: Diagnosis not present

## 2015-02-18 LAB — CBC WITH DIFFERENTIAL/PLATELET
BASO%: 0.6 % (ref 0.0–2.0)
Basophils Absolute: 0.1 10*3/uL (ref 0.0–0.1)
EOS%: 3.6 % (ref 0.0–7.0)
Eosinophils Absolute: 0.3 10*3/uL (ref 0.0–0.5)
HCT: 32.2 % — ABNORMAL LOW (ref 38.4–49.9)
HGB: 10.3 g/dL — ABNORMAL LOW (ref 13.0–17.1)
LYMPH%: 23 % (ref 14.0–49.0)
MCH: 31.3 pg (ref 27.2–33.4)
MCHC: 32.1 g/dL (ref 32.0–36.0)
MCV: 97.4 fL (ref 79.3–98.0)
MONO#: 1 10*3/uL — ABNORMAL HIGH (ref 0.1–0.9)
MONO%: 11.8 % (ref 0.0–14.0)
NEUT#: 5.3 10*3/uL (ref 1.5–6.5)
NEUT%: 61 % (ref 39.0–75.0)
PLATELETS: 225 10*3/uL (ref 140–400)
RBC: 3.3 10*6/uL — ABNORMAL LOW (ref 4.20–5.82)
RDW: 13.7 % (ref 11.0–14.6)
WBC: 8.7 10*3/uL (ref 4.0–10.3)
lymph#: 2 10*3/uL (ref 0.9–3.3)

## 2015-02-18 NOTE — Progress Notes (Addendum)
[facility] [phone]   [phone]  OFFICE PROGRESS NOTE  [name], MD [place] [address]  DIAGNOSIS: Iron deficiency anemia  PRIOR THERAPY: Feraheme 510 mg IV weekly 2, last dose was given on 10/18/2014  CURRENT THERAPY: Integra plus 1 capsule by mouth daily  INTERVAL HISTORY: Stanley [name] 81 y.o. male returns to the clinic today for follow-up visit. He requests an appointment sooner than his 03/10/2015 point with Dr. Julien Nordmann due to complaints of increased fatigue and concerns for decreasing hemoglobin. He continues to take the Integra +1 capsule by mouth daily and is tolerating this medication without difficulty. Starting about 3 weeks ago he noted increased in dyspnea on exertion particular if he walks for a long distance. He complains of feeling weak. He is also followed by Dr. Loralie Champagne, his cardiologist.The patient isotherwise feeling fine with no specific complaints except for some persistent fatigue and shortness breath with exertion. He last received Feraheme 10/11/2014. He tolerated his previous Feraheme infusion fairly well.  He denied having any significant chest pain, cough or hemoptysis. He denied having any disease status. He has no bleeding issues. He had repeat CBC and iron studies performed recently and he is here for evaluation and discussion of his lab results.  MEDICAL HISTORY: Past Medical History  Diagnosis Date  . Diabetes mellitus   . BPH (benign prostatic hyperplasia)   . Arthritis   . GERD (gastroesophageal reflux disease)   . Neuromuscular disorder   . Hypertension     no meds  . Shortness of breath     uses cpap  . 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 Failed total knee, right   . H pylori ulcer     Gastric ulcer  . CAD (coronary artery disease)  s/p CABG   . Aortic stenosis     S/p  pericardial valve  . Anemia   . Mobitz type 1 second degree atrioventricular block     ALLERGIES:  is allergic to morphine and related; succinylcholine; and ace inhibitors.  MEDICATIONS:  Current Outpatient Prescriptions  Medication Sig Dispense Refill  . ALPRAZolam (XANAX) 0.25 MG tablet Take 1 tablet (0.25 mg total) by mouth every 6 (six) hours as needed. 120 tablet 5  . aspirin 81 MG tablet Take 81 mg by mouth daily.    . Calcium Carbonate-Vitamin D (CALCIUM-VITAMIN D) 500-200 MG-UNIT per tablet Take 1 tablet by mouth 2 (two) times daily.     . diclofenac (VOLTAREN) 75 MG EC tablet Take 75 mg by mouth 2 (two) times daily.     . diphenoxylate-atropine (LOMOTIL) 2.5-0.025 MG per tablet TAKE 1 TABLET 4 TIMES DAILY AS NEEDED FOR DIARRHEA OR LOOSE STOOLS. 60 tablet 3  . FeFum-FePoly-FA-B Cmp-C-Biot (INTEGRA PLUS) CAPS TAKE 1 CAPSULE EVERY MORNING 90 capsule 3  . glucose blood (ONE TOUCH TEST STRIPS) test strip Dispense one touch ultra, test TID and diagnosis code is E11.9 100 each 11  . HYDROcodone-acetaminophen (NORCO) 10-325 MG per tablet Take 1 tablet by mouth every 6 (six) hours as needed for severe pain. 120 tablet 0  . Lancets (ONETOUCH ULTRASOFT) lancets TEST 3 TIMES DAILY AS DIRECTED. 100 each PRN  . losartan (COZAAR) 25 MG tablet Take 1 tablet (25 mg total) by mouth daily. 90 tablet 3  . mirabegron ER (MYRBETRIQ) 50 MG TB24 tablet Take 50 mg by mouth daily.    Marland Kitchen  Multiple Vitamin (MULITIVITAMIN WITH MINERALS) TABS Take 1 tablet by mouth daily.    . pantoprazole (PROTONIX) 40 MG tablet Take 1 tablet (40 mg total) by mouth 2 (two) times daily. 60 tablet 11  . pioglitazone (ACTOS) 45 MG tablet Take 1 tablet (45 mg total) by mouth daily. 90 tablet 3  . pramipexole (MIRAPEX) 0.5 MG tablet Take 3 tablets (1.5 mg total) by mouth at bedtime. 90 tablet 2  . predniSONE (STERAPRED UNI-PAK) 5 MG TABS tablet as needed.  0  . prochlorperazine (COMPAZINE) 10 MG tablet take 1 tablet by mouth four  times a day as needed for nausea    . rosuvastatin (CRESTOR) 20 MG tablet Take 10 mg by mouth daily.     . sitaGLIPtin-metformin (JANUMET) 50-1000 MG per tablet take 1 tablet by mouth twice a day WITH A MEAL. 180 tablet 3  . tamsulosin (FLOMAX) 0.4 MG CAPS capsule Take 0.4 mg by mouth daily after breakfast.      No current facility-administered medications for this visit.    SURGICAL HISTORY:  Past Surgical History  Procedure Laterality Date  . Aortic valve replacement    . Foot arthrotomy Right   . Tonsillectomy    . Cataract extraction, bilateral    . Humerus fracture surgery    . Replacement total knee Bilateral   . Toe amputation      rt 2nd  . Cardiac catheterization    . Amputation  01/19/2012    Procedure: AMPUTATION DIGIT;  Surgeon: Colin Rhein, MD;  Location: [facility];  Service: Orthopedics;  Laterality: Right;  right great toe amputation through MTP joint  . Rotator cuff repair Right   . Enteroscopy N/A 10/15/2014    Procedure: ENTEROSCOPY;  Surgeon: Gatha Mayer, MD;  Location: [facility];  Service: Endoscopy;  Laterality: N/A;  . Colonoscopy with propofol N/A 10/17/2014    Procedure: COLONOSCOPY WITH PROPOFOL;  Surgeon: Gatha Mayer, MD;  Location: [facility];  Service: Endoscopy;  Laterality: N/A;  . Abdominal aortagram N/A 02/25/2012    Procedure: ABDOMINAL AORTAGRAM;  Surgeon: Elam Dutch, MD;  Location: [facility] CATH LAB;  Service: Cardiovascular;  Laterality: N/A;    REVIEW OF SYSTEMS:  A comprehensive review of systems was negative except for: Constitutional: positive for fatigue Respiratory: positive for dyspnea on exertion   PHYSICAL EXAMINATION: General appearance: alert, cooperative, fatigued and no distress Head: Normocephalic, without obvious abnormality, atraumatic Neck: no adenopathy, no JVD, supple, symmetrical, trachea midline and thyroid not enlarged, symmetric, no tenderness/mass/nodules Lymph nodes: Cervical, supraclavicular,  and axillary nodes normal. Resp: clear to auscultation bilaterally Back: symmetric, no curvature. ROM normal. No CVA tenderness. Cardio: Normal S1 and S2 with systolic murmur GI: soft, non-tender; bowel sounds normal; no masses,  no organomegaly Extremities: extremities normal, atraumatic, no cyanosis or edema  ECOG PERFORMANCE STATUS: 2 - Symptomatic, <50% confined to bed  Blood pressure 114/45, pulse 66, temperature 98 F (36.7 C), temperature source Oral, resp. rate 18, height 5\' 6"  (1.676 m), weight 186 lb 4.8 oz (84.505 kg), SpO2 98 %.  LABORATORY DATA: Lab Results  Component Value Date   WBC 8.7 02/18/2015   HGB 10.3* 02/18/2015   HCT 32.2* 02/18/2015   MCV 97.4 02/18/2015   PLT 225 02/18/2015      Chemistry      Component Value Date/Time   NA 135* 10/16/2014 0517   NA 130* 10/09/2014 1123   K 4.8 10/16/2014 0517   K 5.3* 10/09/2014 1123  CL 100 10/16/2014 0517   CO2 22 10/16/2014 0517   CO2 21* 10/09/2014 1123   BUN 18 10/16/2014 0517   BUN 31.2* 10/09/2014 1123   CREATININE 0.78 10/16/2014 0517   CREATININE 1.4* 10/09/2014 1123      Component Value Date/Time   CALCIUM 9.3 10/16/2014 0517   CALCIUM 9.3 10/09/2014 1123   ALKPHOS 73 10/14/2014 1456   ALKPHOS 70 10/09/2014 1123   AST 18 10/14/2014 1456   AST 17 10/09/2014 1123   ALT 16 10/14/2014 1456   ALT 15 10/09/2014 1123   BILITOT <0.2* 10/14/2014 1456   BILITOT 0.22 10/09/2014 1123       RADIOGRAPHIC STUDIES: No results found.  ASSESSMENT AND PLAN: This is a very pleasant 81 years old white male with iron deficiency anemia status post Feraheme infusion and currently on Integra plus 1 capsule by mouth daily and tolerating it fairly well. Patient was discussed with and also seen by Dr. Julien Nordmann. His iron studies are pending from today. His diastolic blood pressure is staying in the 44-45range. Patient is advised to contact his cardiologist, Dr. Loralie Champagne, as his medications may need to be adjusted.  This low diastolic may be contributing to Dr. Jerene Canny symptoms of feeling weak. His hemoglobin is 10.3 today down approximately 1 g from December 2015.he will continue on his Integra plus, 1 capsule by mouth daily. We'll have him keep his previously scheduled appointment with Dr. Julien Nordmann at which time we will discuss whether or not further Shirlean Kelly is indicated. He is encouraged to see his cardiologist in the interim.  He was advised to call immediately if he has any concerning symptoms in the interval. The patient voices understanding of current disease status and treatment options and is in agreement with the current care plan.  All questions were answered. The patient knows to call the clinic with any problems, questions or concerns. We can certainly see the patient much sooner if necessary.  Stanley Adam, PA-C 02/18/2015  ADDENDUM: Hematology/Oncology Attending: I had a face to face encounter with the patient. I recommended his care plan. 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 He received Feraheme infusion in the past was some improvement in his hemoglobin and hematocrit. Has been on oral Integra +1 capsule by mouth daily but there was a slight decline in his hemoglobin and hematocrit recently was increasing fatigue. I will order repeat CBC, iron study and ferritin studies are still pending. If there is no significant decline in his iron study, we may consider the patient for treatment with erythrocyte stimulating factor like an S for the anemia of chronic disease. He would come back for follow-up visit in 2 weeks for reevaluation and more detailed discussion of his treatment options based on the pending lab results. He was advised to call if he has any concerning symptoms in the interval.  Disclaimer: This note was dictated with voice recognition software. Similar sounding words  can inadvertently be transcribed and may not be corrected upon review. Eilleen Kempf., MD 02/22/2015

## 2015-02-19 ENCOUNTER — Ambulatory Visit: Payer: Medicare Other | Attending: Family Medicine

## 2015-02-19 DIAGNOSIS — R29898 Other symptoms and signs involving the musculoskeletal system: Secondary | ICD-10-CM

## 2015-02-19 DIAGNOSIS — M6281 Muscle weakness (generalized): Secondary | ICD-10-CM | POA: Diagnosis not present

## 2015-02-19 DIAGNOSIS — R2681 Unsteadiness on feet: Secondary | ICD-10-CM | POA: Diagnosis present

## 2015-02-19 LAB — IRON AND TIBC CHCC
%SAT: 28 % (ref 20–55)
IRON: 81 ug/dL (ref 42–163)
TIBC: 291 ug/dL (ref 202–409)
UIBC: 211 ug/dL (ref 117–376)

## 2015-02-19 LAB — FERRITIN CHCC: FERRITIN: 171 ng/mL (ref 22–316)

## 2015-02-19 NOTE — Therapy (Signed)
[facility] [address] [place], [place], [address] Phone: [phone]   Fax:  [phone]  Physical Therapy Treatment  Patient Details  Name: Stanley [name] MRN: [id number] Date of Birth: March 13, 1925 Referring Provider:  [name], MD  Encounter Date: 02/19/2015      PT End of Session - 02/19/15 1439    Visit Number 24   Number of Visits 30  Medicare   Date for PT Re-Evaluation 02/28/15   PT Start Time 1400   PT Stop Time 1441   PT Time Calculation (min) 41 min   Activity Tolerance Patient tolerated treatment well   Behavior During Therapy [facility] for tasks assessed/performed      Past Medical History  Diagnosis Date  . Diabetes mellitus   . BPH (benign prostatic hyperplasia)   . Arthritis   . GERD (gastroesophageal reflux disease)   . Neuromuscular disorder   . Hypertension     no meds  . Shortness of breath     uses cpap  . 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 Failed total knee, right   . H pylori ulcer     Gastric ulcer  . CAD (coronary artery disease)  s/p CABG   . Aortic stenosis     S/p pericardial valve  . Anemia   . Mobitz type 1 second degree atrioventricular block     Past Surgical History  Procedure Laterality Date  . Aortic valve replacement    . Foot arthrotomy Right   . Tonsillectomy    . Cataract extraction, bilateral    . Humerus fracture surgery    . Replacement total knee Bilateral   . Toe amputation      rt 2nd  . Cardiac catheterization    . Amputation  01/19/2012    Procedure: AMPUTATION DIGIT;  Surgeon: Colin Rhein, MD;  Location: [facility];  Service: Orthopedics;  Laterality: Right;  right great toe amputation through MTP joint  . Rotator cuff repair Right   . Enteroscopy N/A 10/15/2014    Procedure: ENTEROSCOPY;  Surgeon: Gatha Mayer, MD;  Location: [facility];   Service: Endoscopy;  Laterality: N/A;  . Colonoscopy with propofol N/A 10/17/2014    Procedure: COLONOSCOPY WITH PROPOFOL;  Surgeon: Gatha Mayer, MD;  Location: [facility];  Service: Endoscopy;  Laterality: N/A;  . Abdominal aortagram N/A 02/25/2012    Procedure: ABDOMINAL AORTAGRAM;  Surgeon: Elam Dutch, MD;  [facility] C[facility] LAB;  Service: Cardiovascular;  Laterality: N/A;    There were no vitals taken for this visit.  Visit Diagnosis:  Unsteady gait  Weakness of both legs      Subjective Assessment - 02/19/15 1410    Symptoms Pt reports that hemoglobin was 10.2 yesterday.  More fatigue because of this.   Currently in Pain? Yes   Pain Score 5    Pain Orientation Right;Left;Lower   Pain Descriptors / Indicators Aching   Aggravating Factors  standing and walking   Pain Relieving Factors sitting down   Multiple Pain Sites No                    [facility] Adult PT Treatment/Exercise - 02/19/15 0001    Knee/Hip Exercises: Aerobic   Stationary Bike Level 2 x 10 minutes   Knee/Hip Exercises: Standing   Other Standing Knee Exercises standing on blue balance pad 2 x 1  min  with close S to min A to maintain balance   Other Standing Knee Exercises mini tramp 3 ways x 1 minute each   Knee/Hip Exercises: Seated   Long Arc Quad Strengthening;Both;3 sets;10 reps  continues to be challening to patient   [employer] Weight 3 lbs.   Other Seated Knee Exercises seated marching 3# 3x10   Shoulder Exercises: Standing   Other Standing Exercises cone stacking to 1st self to challenge endurance for standing and balance 2x2 minutes   Shoulder Exercises: ROM/Strengthening   Other ROM/Strengthening Exercises sitting 35# 3x10 performed seated   Ankle Exercises: Seated   Other Seated Ankle Exercises rockerboard PF/DF x 3 min                  PT Short Term Goals - 02/04/15 1500    PT SHORT TERM GOAL #1   Title be independent with initial HEP   Time 8   Period Weeks    Status Achieved   PT SHORT TERM GOAL #2   Title perform sit to stand with moderate UE support   Time 8   Period Weeks   Status Achieved   PT SHORT TERM GOAL #3   Title reduce TUG to < or = to 22 seconds   Time 8   Period Weeks   Status Achieved           PT Long Term Goals - 02/17/15 1405    PT LONG TERM GOAL #1   Title demonstrate and/or verbalize techniques to reduce the risk of re-injury to include info on fall prevention   Status Achieved   PT LONG TERM GOAL #2   Title be independent with advanced HEP   Time 8   Period Weeks   Status On-going  Pt independent and compliant in current HEP   PT LONG TERM GOAL #3   Title perform sit to stand with moderate to minimal UE support   Time 8   Period Weeks   Status On-going  moderate to max support required   PT LONG TERM GOAL #4   Title Perform TUG in < or = to 19 seconds   Status Achieved               Plan - 02/19/15 1432    Clinical Impression Statement Pt with fatigue today secondary to low hemoglobin levels.  Pt with loss of balance with sit to stand today.   Pt will benefit from skilled therapeutic intervention in order to improve on the following deficits Abnormal gait;Difficulty walking;Decreased activity tolerance;Decreased balance   Rehab Potential Good   PT Frequency 2x / week   PT Duration 8 weeks   PT Treatment/Interventions Therapeutic exercise;Balance training;Neuromuscular re-education   PT Next Visit Plan balance exercises, endurance exercises, UE/LE strength.  D/C vs renewal next week   Consulted and Agree with Plan of Care Patient        Problem List Patient Active Problem List   Diagnosis Date Noted  . Tremors of nervous system 01/29/2015  . Bradycardia 11/07/2014  . Colon polyp 10/17/2014  . Mobitz type 1 second degree atrioventricular block   . Heme + stool 10/15/2014  . Symptomatic anemia 10/14/2014  . Dyspnea on exertion 10/14/2014  . Unsteady gait 10/02/2014  . Chronic  systolic CHF (congestive heart failure) 05/12/2014  . Orthostatic lightheadedness 03/19/2013  . First degree AV block 07/25/2012  . Atherosclerosis of native arteries of the extremities with ulceration 02/17/2012  . HYPOTENSION,  UNSPECIFIED 10/08/2010  . SECOND DEGREE AV HEART BLOCK, MOBITZ I 09/04/2010  . S/P AVR 09/01/2010  . CAD, NATIVE VESSEL 06/24/2010  . Diabetes mellitus without complication 99/37/1696  . CAD (coronary artery disease) of artery bypass graft 02/16/2010  . OBSTRUCTIVE SLEEP APNEA 07/21/2009  . Hypothyroidism 12/07/2007  . HTN (hypertension) 12/07/2007  . Iron deficiency anemia 12/07/2007  . ELEVATED PROSTATE SPECIFIC ANTIGEN 12/07/2007    Iktan Aikman, PT 02/19/2015, 2:49 PM  [facility] [address] [place], [place], [address] Phone: [phone]   Fax:  [phone]

## 2015-02-21 ENCOUNTER — Telehealth: Payer: Self-pay | Admitting: Cardiology

## 2015-02-21 NOTE — Telephone Encounter (Signed)
New Msg       Pt calling. Request to speak with Dr. Aundra Dubin directly.     Please return call. No details given.

## 2015-02-21 NOTE — Telephone Encounter (Signed)
Patient st he is anemic and his diastolic BP is in the 49'I. He has no complaints of symptoms.  Patient st Dr. Aundra Dubin gave him his cell phone number but patient miswrote the number.  Patient requesting callback from Dr. Aundra Dubin himself to speak about medications.  Informed patient that Dr. Aundra Dubin is out of the office but a message will be sent to him with patient's request.

## 2015-02-21 NOTE — Patient Instructions (Signed)
Urine studies are pending from today's visit. Keep your previously scheduled appointment with Dr. Julien Nordmann on 03/10/2015 which time your results of the reviewed and treatment options discussed.

## 2015-02-21 NOTE — Telephone Encounter (Signed)
I told the patient to stop losartan.

## 2015-02-24 ENCOUNTER — Encounter: Payer: Self-pay | Admitting: Physical Therapy

## 2015-02-24 ENCOUNTER — Ambulatory Visit: Payer: Medicare Other | Admitting: Physical Therapy

## 2015-02-24 ENCOUNTER — Telehealth: Payer: Self-pay | Admitting: *Deleted

## 2015-02-24 DIAGNOSIS — R2681 Unsteadiness on feet: Secondary | ICD-10-CM | POA: Diagnosis not present

## 2015-02-24 DIAGNOSIS — R29898 Other symptoms and signs involving the musculoskeletal system: Secondary | ICD-10-CM

## 2015-02-24 NOTE — Telephone Encounter (Signed)
Received phone call from patient stating he would like his lab results and also wanting to know if he needed to double his Iron dosage.  Spoke with Awilda Metro, PA.  Informed patient of lab results and to continue taking iron once daily.  Per Awilda Metro, PA.  Patient verbalized understanding.

## 2015-02-24 NOTE — Therapy (Signed)
[facility] [address], [place], [place], [address] Phone: [phone]   Fax:  [phone]  Physical Therapy Treatment  Patient Details  Name: Stanley [name] MRN: [id number] Date of Birth: Feb 20, 1925 Referring Provider:  [name], MD  Encounter Date: 02/24/2015      PT End of Session - 02/24/15 1732    Visit Number 25   Number of Visits 30   Date for PT Re-Evaluation 02/28/15   PT Start Time 4709   PT Stop Time 1457   PT Time Calculation (min) 49 min   Activity Tolerance Patient tolerated treatment well   Behavior During Therapy [facility] for tasks assessed/performed      Past Medical History  Diagnosis Date  . Diabetes mellitus   . BPH (benign prostatic hyperplasia)   . Arthritis   . GERD (gastroesophageal reflux disease)   . Neuromuscular disorder   . Hypertension     no meds  . Shortness of breath     uses cpap  . 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 Failed total knee, right   . H pylori ulcer     Gastric ulcer  . CAD (coronary artery disease)  s/p CABG   . Aortic stenosis     S/p pericardial valve  . Anemia   . Mobitz type 1 second degree atrioventricular block     Past Surgical History  Procedure Laterality Date  . Aortic valve replacement    . Foot arthrotomy Right   . Tonsillectomy    . Cataract extraction, bilateral    . Humerus fracture surgery    . Replacement total knee Bilateral   . Toe amputation      rt 2nd  . Cardiac catheterization    . Amputation  01/19/2012    Procedure: AMPUTATION DIGIT;  Surgeon: Colin Rhein, MD;  Location: [place];  Service: Orthopedics;  Laterality: Right;  right great toe amputation through MTP joint  . Rotator cuff repair Right   . Enteroscopy N/A 10/15/2014    Procedure: ENTEROSCOPY;  Surgeon: Gatha Mayer, MD;  Location: [facility];  Service:  Endoscopy;  Laterality: N/A;  . Colonoscopy with propofol N/A 10/17/2014    Procedure: COLONOSCOPY WITH PROPOFOL;  Surgeon: Gatha Mayer, MD;  Location: [facility];  Service: Endoscopy;  Laterality: N/A;  . Abdominal aortagram N/A 02/25/2012    Procedure: ABDOMINAL AORTAGRAM;  Surgeon: Elam Dutch, MD;  Location: [facility] CATH LAB;  Service: Cardiovascular;  Laterality: N/A;    There were no vitals taken for this visit.  Visit Diagnosis:  Unsteady gait  Weakness of both legs      Subjective Assessment - 02/24/15 1412    Symptoms Pt reports his hemobglobin is 10.3, all over he states sometimes having good days with walking and balance.     Limitations Walking;Standing   How long can you walk comfortably? 1-2 min   Patient Stated Goals walk and improve balance, incr endurance    Currently in Pain? Yes   Pain Score 4    Pain Location Back   Pain Orientation Right;Left   Pain Type Chronic pain   Pain Radiating Towards None   Pain Frequency Intermittent   Aggravating Factors  standing is limited to 5-36min,  and walking is limited to 8 min   Pain Relieving Factors sitting down   Multiple Pain Sites No  [facility] Adult PT Treatment/Exercise - 02/24/15 0001    Ambulation/Gait   Gait velocity TUG x 3 21 sec x 2, 19sec x 1   Knee/Hip Exercises: Aerobic   Stationary Bike Level 2 x 90min   Knee/Hip Exercises: Standing   Other Standing Knee Exercises standing on blue balance pad 2 x 1 min  with close S to min A to maintain balance   Other Standing Knee Exercises mini tramp 3 ways x 1 minute each   Knee/Hip Exercises: Seated   Long Arc Quad Strengthening;Both;3 sets;10 reps  continues to be challening to patient   [employer] Weight 3 lbs.   Shoulder Exercises: Standing   Other Standing Exercises cone stacking to 1st self to challenge endurance for standing and balance 2x2 minutes   Shoulder Exercises: ROM/Strengthening   Other ROM/Strengthening Exercises  sitting 35# 3x10 performed seated                  PT Short Term Goals - 02/04/15 1500    PT SHORT TERM GOAL #1   Title be independent with initial HEP   Time 8   Period Weeks   Status Achieved   PT SHORT TERM GOAL #2   Title perform sit to stand with moderate UE support   Time 8   Period Weeks   Status Achieved   PT SHORT TERM GOAL #3   Title reduce TUG to < or = to 22 seconds   Time 8   Period Weeks   Status Achieved           PT Long Term Goals - 02/17/15 1405    PT LONG TERM GOAL #1   Title demonstrate and/or verbalize techniques to reduce the risk of re-injury to include info on fall prevention   Status Achieved   PT LONG TERM GOAL #2   Title be independent with advanced HEP   Time 8   Period Weeks   Status On-going  Pt independent and compliant in current HEP   PT LONG TERM GOAL #3   Title perform sit to stand with moderate to minimal UE support   Time 8   Period Weeks   Status On-going  moderate to max support required   PT LONG TERM GOAL #4   Title Perform TUG in < or = to 19 seconds   Status Achieved               Plan - 02/24/15 1733    Clinical Impression Statement Pt. inconsistent with confidence with ambulation due to limited strength, endurance, and low hemoglobin levels   Pt will benefit from skilled therapeutic intervention in order to improve on the following deficits Abnormal gait;Difficulty walking;Decreased activity tolerance;Decreased balance   Rehab Potential Good   Clinical Impairments Affecting Rehab Potential None   PT Frequency 2x / week   PT Duration 8 weeks   PT Treatment/Interventions Therapeutic exercise;Balance training;Neuromuscular re-education   PT Next Visit Plan Renewal to continue to improve  balance, strength and endurance        Problem List Patient Active Problem List   Diagnosis Date Noted  . Tremors of nervous system 01/29/2015  . Bradycardia 11/07/2014  . Colon polyp 10/17/2014  . Mobitz  type 1 second degree atrioventricular block   . Heme + stool 10/15/2014  . Symptomatic anemia 10/14/2014  . Dyspnea on exertion 10/14/2014  . Unsteady gait 10/02/2014  . Chronic systolic CHF (congestive heart failure) 05/12/2014  . Orthostatic lightheadedness 03/19/2013  .  First degree AV block 07/25/2012  . Atherosclerosis of native arteries of the extremities with ulceration 02/17/2012  . HYPOTENSION, UNSPECIFIED 10/08/2010  . SECOND DEGREE AV HEART BLOCK, MOBITZ I 09/04/2010  . S/P AVR 09/01/2010  . CAD, NATIVE VESSEL 06/24/2010  . Diabetes mellitus without complication 35/68/6168  . CAD (coronary artery disease) of artery bypass graft 02/16/2010  . OBSTRUCTIVE SLEEP APNEA 07/21/2009  . Hypothyroidism 12/07/2007  . HTN (hypertension) 12/07/2007  . Iron deficiency anemia 12/07/2007  . ELEVATED PROSTATE SPECIFIC ANTIGEN 12/07/2007    NAUMANN-HOUEGNIFIO,Giuseppe Duchemin PTA 02/24/2015, 5:37 PM  [facility] [address], [place], [place], [address] Phone: [phone]   Fax:  [phone]

## 2015-02-27 ENCOUNTER — Ambulatory Visit: Payer: Medicare Other | Admitting: Physical Therapy

## 2015-02-28 ENCOUNTER — Encounter: Payer: Self-pay | Admitting: Physical Therapy

## 2015-02-28 ENCOUNTER — Ambulatory Visit: Payer: Medicare Other | Admitting: Physical Therapy

## 2015-02-28 DIAGNOSIS — R2681 Unsteadiness on feet: Secondary | ICD-10-CM

## 2015-02-28 DIAGNOSIS — R29898 Other symptoms and signs involving the musculoskeletal system: Secondary | ICD-10-CM

## 2015-02-28 NOTE — Patient Instructions (Signed)
With Support   Stand on one leg in neutral spine holding support. Hold __10__ seconds. Repeat on other leg.Use finger tips on counter. Do __3__ repetitions, _1___ sets. Each leg.  3x/week   http://bt.exer.us/34   Copyright  VHI. All rights reserved.    Tandem Stance   Hold onto counter. Right foot in front of left, heel touching toe both feet "straight ahead". Stand on Foot Triangle of Support with both feet. Balance in this position _30__ seconds. 2 times Do with left foot in front of right. 2 times . 3 x/weeks  Copyright  VHI. All rights reserved.   Knee High   Holding stable object, raise knee to hip level, then lower knee. Repeat with other knee. Complete __10_ repetitions. Do __2__ sessions per day.  ABDUCTION: Standing (Active)   Stand, feet flat. Lift right leg out to side. Use _0__ lbs. Complete __10_ repetitions. Perform __2_ sessions per day.    EXTENSION: Standing (Active)  Stand, both feet flat. Draw right leg behind body as far as possible. Use 0___ lbs. Complete 10 repetitions. Perform __2_ sessions per day.  Copyright  VHI. All rights reserved.   Patient able to return demonstration correctly with above exercises.

## 2015-02-28 NOTE — Therapy (Signed)
[facility] [address], [place], [place], [address] Phone: [phone]   Fax:  [phone]  Physical Therapy Treatment  Patient Details  Name: Stanley [name] MRN: [id number] Date of Birth: 14-Jun-1925 Referring Provider:  [name], MD  Encounter Date: 02/28/2015      PT End of Session - 02/28/15 1141    Visit Number 26   Number of Visits 30   Date for PT Re-Evaluation 02/28/15   PT Start Time 1100   PT Stop Time 1154   PT Time Calculation (min) 54 min   Activity Tolerance Patient tolerated treatment well   Behavior During Therapy [facility] for tasks assessed/performed      Past Medical History  Diagnosis Date  . Diabetes mellitus   . BPH (benign prostatic hyperplasia)   . Arthritis   . GERD (gastroesophageal reflux disease)   . Neuromuscular disorder   . Hypertension     no meds  . Shortness of breath     uses cpap  . 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 Failed total knee, right   . H pylori ulcer     Gastric ulcer  . CAD (coronary artery disease)  s/p CABG   . Aortic stenosis     S/p pericardial valve  . Anemia   . Mobitz type 1 second degree atrioventricular block     Past Surgical History  Procedure Laterality Date  . Aortic valve replacement    . Foot arthrotomy Right   . Tonsillectomy    . Cataract extraction, bilateral    . Humerus fracture surgery    . Replacement total knee Bilateral   . Toe amputation      rt 2nd  . Cardiac catheterization    . Amputation  01/19/2012    Procedure: AMPUTATION DIGIT;  Surgeon: Colin Rhein, MD;  Location: [place];  Service: Orthopedics;  Laterality: Right;  right great toe amputation through MTP joint  . Rotator cuff repair Right   . Enteroscopy N/A 10/15/2014    Procedure: ENTEROSCOPY;  Surgeon: Gatha Mayer, MD;  Location: [facility];  Service:  Endoscopy;  Laterality: N/A;  . Colonoscopy with propofol N/A 10/17/2014    Procedure: COLONOSCOPY WITH PROPOFOL;  Surgeon: Gatha Mayer, MD;  Location: [facility];  Service: Endoscopy;  Laterality: N/A;  . Abdominal aortagram N/A 02/25/2012    Procedure: ABDOMINAL AORTAGRAM;  Surgeon: Elam Dutch, MD;  Location: [facility] CATH LAB;  Service: Cardiovascular;  Laterality: N/A;    There were no vitals filed for this visit.  Visit Diagnosis:  Unsteady gait  Weakness of both legs      Subjective Assessment - 02/28/15 1115    Symptoms Patient reports his balance and walking has improved by 75%. Patient has to use cane and hold onto someone when walking on unlevel surface or incline.    Limitations Walking;Standing   How long can you walk comfortably? Patient will alternate with the single point cane and walker.  Patient uses the walker in the house and places he can use it.    Currently in Pain? Yes   Pain Score 8    Pain Location Back   Pain Orientation Mid   Pain Descriptors / Indicators Aching   Pain Type Chronic pain   Pain Radiating Towards None   Pain Onset More than a month ago   Pain Frequency Intermittent  Aggravating Factors  walking   Pain Relieving Factors sit down   Effect of Pain on Daily Activities makes it difficult to walk   Multiple Pain Sites No            [facility] PT Assessment - 02/28/15 0001    Assessment   Medical Diagnosis Unsteady gait   Precautions   Precautions Fall   Balance Screen   Has the patient fallen in the past 6 months No   Has the patient had a decrease in activity level because of a fear of falling?  No   Is the patient reluctant to leave their home because of a fear of falling?  No   Prior Function   Level of Independence Independent with basic ADLs   Vocation Retired   Observation/Other Assessments   Other Surveys  --  25 sec with single point cane   Strength   Overall Strength --  right hip strength 4/5, right knee strength is  4/5   Timed Up and Go Test   TUG Normal TUG   Normal TUG (seconds) 25   TUG Comments used cane                   [facility] Adult PT Treatment/Exercise - 02/28/15 0001    Knee/Hip Exercises: Aerobic   Stationary Bike level 2 x 10 minutes   Knee/Hip Exercises: Standing   Other Standing Knee Exercises standing on blue balance pad 2 x 1 min  with close S to min A to maintain balance   Other Standing Knee Exercises mini tramp 3 ways x 1 minute each                PT Education - 02/28/15 1140    Education provided Yes   Education Details Balance exercises   Person(s) Educated Patient   Methods Explanation;Demonstration;Verbal cues;Handout   Comprehension Verbalized understanding;Returned demonstration          PT Short Term Goals - 02/28/15 1144    PT SHORT TERM GOAL #1   Title be independent with initial HEP   Time 8   Period Weeks   Status Achieved   PT SHORT TERM GOAL #2   Title perform sit to stand with moderate UE support   Time 8   Period Weeks   Status Achieved   PT SHORT TERM GOAL #3   Title reduce TUG to < or = to 22 seconds   Period Weeks   Status Not Met  today it was 25 seconds           PT Long Term Goals - 02/28/15 1144    PT LONG TERM GOAL #1   Title demonstrate and/or verbalize techniques to reduce the risk of re-injury to include info on fall prevention   Time 8   Period Weeks   Status Achieved   PT LONG TERM GOAL #2   Title be independent with advanced HEP   Time 8   Period Weeks   Status Achieved   PT LONG TERM GOAL #3   Title perform sit to stand with moderate to minimal UE support   Time 8   Period Weeks   Status Achieved   PT LONG TERM GOAL #4   Title Perform TUG in < or = to 19 seconds   Time 8   Period Weeks   Status Not Met  today was 25 sec.   PT LONG TERM GOAL #5   Title improved strength reports 50%  lesss fatique with walking dog   Time 8   Period Weeks   Status Achieved   PT LONG TERM GOAL #6   Title  wean from walker/cane 25% of the time for home distances   Time 8   Status Not Met  still has to use the cane or walker at all times               Plan - 03-10-2015 1142    Clinical Impression Statement Patient is independent with his HEP. Patient is able to get out of a chair with no armrest 1 try due to increased strength.    Pt will benefit from skilled therapeutic intervention in order to improve on the following deficits Abnormal gait;Difficulty walking;Decreased activity tolerance;Decreased balance   Rehab Potential Good   Clinical Impairments Affecting Rehab Potential None   PT Frequency 2x / week   PT Duration 8 weeks   PT Treatment/Interventions Therapeutic exercise;Balance training;Neuromuscular re-education   PT Next Visit Plan Discharge   Recommended Other Services Workout at home or at a facility   Consulted and Agree with Plan of Care Patient          G-Codes - 10-Mar-2015 1150    Functional Assessment Tool Used TUG 25 seconds   Functional Limitation Mobility: Walking and moving around   Mobility: Walking and Moving Around Goal Status [phone]) At least 40 percent but less than 60 percent impaired, limited or restricted   Mobility: Walking and Moving Around Discharge Status [phone]) At least 40 percent but less than 60 percent impaired, limited or restricted      Problem List Patient Active Problem List   Diagnosis Date Noted  . Tremors of nervous system 01/29/2015  . Bradycardia 11/07/2014  . Colon polyp 10/17/2014  . Mobitz type 1 second degree atrioventricular block   . Heme + stool 10/15/2014  . Symptomatic anemia 10/14/2014  . Dyspnea on exertion 10/14/2014  . Unsteady gait 10/02/2014  . Chronic systolic CHF (congestive heart failure) 05/12/2014  . Orthostatic lightheadedness 03/19/2013  . First degree AV block 07/25/2012  . Atherosclerosis of native arteries of the extremities with ulceration 02/17/2012  . HYPOTENSION, UNSPECIFIED 10/08/2010  . SECOND  DEGREE AV HEART BLOCK, MOBITZ I 09/04/2010  . S/P AVR 09/01/2010  . CAD, NATIVE VESSEL 06/24/2010  . Diabetes mellitus without complication 26/41/5830  . CAD (coronary artery disease) of artery bypass graft 02/16/2010  . OBSTRUCTIVE SLEEP APNEA 07/21/2009  . Hypothyroidism 12/07/2007  . HTN (hypertension) 12/07/2007  . Iron deficiency anemia 12/07/2007  . ELEVATED PROSTATE SPECIFIC ANTIGEN 12/07/2007    Docia Klar, PT Mar 10, 2015, 11:58 AM  [facility] [address], [place], [place], [address] Phone: [phone]   Fax:  [phone]  PHYSICAL THERAPY DISCHARGE SUMMARY  Visits from Start of Care: 26  Current functional level related to goals / functional outcomes: See above goals and assessment.    Remaining deficits: Patient has decreased in his TUG score but some days he has an improvement.  It depends on the day.    Education / Equipment: HEP for balance and strength  Plan: Patient agrees to discharge.  Patient goals were met. Patient is being discharged due to meeting the stated rehab goals.  Thank you for the referral.  ?????   Earlie Counts, PT Mar 10, 2015 11:58 AM

## 2015-03-05 ENCOUNTER — Other Ambulatory Visit (HOSPITAL_BASED_OUTPATIENT_CLINIC_OR_DEPARTMENT_OTHER): Payer: Medicare Other

## 2015-03-05 DIAGNOSIS — D509 Iron deficiency anemia, unspecified: Secondary | ICD-10-CM | POA: Diagnosis not present

## 2015-03-05 DIAGNOSIS — D638 Anemia in other chronic diseases classified elsewhere: Secondary | ICD-10-CM | POA: Diagnosis not present

## 2015-03-05 LAB — CBC WITH DIFFERENTIAL/PLATELET
BASO%: 0.6 % (ref 0.0–2.0)
Basophils Absolute: 0 10*3/uL (ref 0.0–0.1)
EOS%: 5.8 % (ref 0.0–7.0)
Eosinophils Absolute: 0.3 10*3/uL (ref 0.0–0.5)
HCT: 34.9 % — ABNORMAL LOW (ref 38.4–49.9)
HGB: 11.1 g/dL — ABNORMAL LOW (ref 13.0–17.1)
LYMPH%: 21.2 % (ref 14.0–49.0)
MCH: 31.4 pg (ref 27.2–33.4)
MCHC: 31.9 g/dL — AB (ref 32.0–36.0)
MCV: 98.4 fL — ABNORMAL HIGH (ref 79.3–98.0)
MONO#: 0.7 10*3/uL (ref 0.1–0.9)
MONO%: 12.2 % (ref 0.0–14.0)
NEUT#: 3.6 10*3/uL (ref 1.5–6.5)
NEUT%: 60.2 % (ref 39.0–75.0)
Platelets: 188 10*3/uL (ref 140–400)
RBC: 3.54 10*6/uL — AB (ref 4.20–5.82)
RDW: 13.6 % (ref 11.0–14.6)
WBC: 5.9 10*3/uL (ref 4.0–10.3)
lymph#: 1.3 10*3/uL (ref 0.9–3.3)

## 2015-03-10 ENCOUNTER — Ambulatory Visit (HOSPITAL_BASED_OUTPATIENT_CLINIC_OR_DEPARTMENT_OTHER): Payer: Medicare Other | Admitting: Internal Medicine

## 2015-03-10 ENCOUNTER — Encounter: Payer: Self-pay | Admitting: Internal Medicine

## 2015-03-10 ENCOUNTER — Telehealth: Payer: Self-pay | Admitting: Internal Medicine

## 2015-03-10 VITALS — BP 141/48 | HR 66 | Temp 98.2°F | Resp 18 | Ht 66.0 in | Wt 187.8 lb

## 2015-03-10 DIAGNOSIS — D509 Iron deficiency anemia, unspecified: Secondary | ICD-10-CM

## 2015-03-10 NOTE — Telephone Encounter (Signed)
gv and printed appt sched and avs for pt for May °

## 2015-03-10 NOTE — Progress Notes (Signed)
[facility] [phone]   [phone]  OFFICE PROGRESS NOTE  [name], MD [place] [address]  DIAGNOSIS: Iron deficiency anemia  PRIOR THERAPY: Feraheme 510 mg IV weekly x 2, last dose was given on 10/18/2014.  CURRENT THERAPY: Integra plus 1 capsule by mouth daily.  INTERVAL HISTORY: Stanley [name] 81 y.o. male returns to the clinic today for follow-up visit. The patient is feeling fine with no specific complaints except for some persistent fatigue and shortness breath with exertion. The patient is currently on Integra plus 1 capsule by mouth daily and also tolerating it well. He denied having any significant chest pain, cough or hemoptysis. He denied having any disease status. He has no bleeding issues. He had repeat CBC and iron study performed recently and he is here for evaluation and discussion of his lab results.  MEDICAL HISTORY: Past Medical History  Diagnosis Date  . Diabetes mellitus   . BPH (benign prostatic hyperplasia)   . Arthritis   . GERD (gastroesophageal reflux disease)   . Neuromuscular disorder   . Hypertension     no meds  . Shortness of breath     uses cpap  . 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 Failed total knee, right   . H pylori ulcer     Gastric ulcer  . CAD (coronary artery disease)  s/p CABG   . Aortic stenosis     S/p pericardial valve  . Anemia   . Mobitz type 1 second degree atrioventricular block     ALLERGIES:  is allergic to morphine and related; succinylcholine; and ace inhibitors.  MEDICATIONS:  Current Outpatient Prescriptions  Medication Sig Dispense Refill  . ALPRAZolam (XANAX) 0.25 MG tablet Take 1 tablet (0.25 mg total) by mouth every 6 (six) hours as needed. 120 tablet 5  . aspirin 81 MG tablet Take 81 mg by mouth daily.    . Calcium Carbonate-Vitamin D (CALCIUM-VITAMIN D)  500-200 MG-UNIT per tablet Take 1 tablet by mouth 2 (two) times daily.     . diclofenac (VOLTAREN) 75 MG EC tablet Take 75 mg by mouth 2 (two) times daily.     . diphenoxylate-atropine (LOMOTIL) 2.5-0.025 MG per tablet TAKE 1 TABLET 4 TIMES DAILY AS NEEDED FOR DIARRHEA OR LOOSE STOOLS. 60 tablet 3  . FeFum-FePoly-FA-B Cmp-C-Biot (INTEGRA PLUS) CAPS TAKE 1 CAPSULE EVERY MORNING 90 capsule 3  . glucose blood (ONE TOUCH TEST STRIPS) test strip Dispense one touch ultra, test TID and diagnosis code is E11.9 100 each 11  . HYDROcodone-acetaminophen (NORCO) 10-325 MG per tablet Take 1 tablet by mouth every 6 (six) hours as needed for severe pain. 120 tablet 0  . Lancets (ONETOUCH ULTRASOFT) lancets TEST 3 TIMES DAILY AS DIRECTED. 100 each PRN  . mirabegron ER (MYRBETRIQ) 50 MG TB24 tablet Take 50 mg by mouth daily.    . Multiple Vitamin (MULITIVITAMIN WITH MINERALS) TABS Take 1 tablet by mouth daily.    . pantoprazole (PROTONIX) 40 MG tablet Take 1 tablet (40 mg total) by mouth 2 (two) times daily. 60 tablet 11  . pioglitazone (ACTOS) 45 MG tablet Take 1 tablet (45 mg total) by mouth daily. 90 tablet 3  . pramipexole (MIRAPEX) 0.5 MG tablet Take 3 tablets (1.5 mg total) by mouth at bedtime. 90 tablet 2  . predniSONE (STERAPRED UNI-PAK) 5 MG TABS tablet as needed.  0  .  prochlorperazine (COMPAZINE) 10 MG tablet take 1 tablet by mouth four times a day as needed for nausea    . rosuvastatin (CRESTOR) 20 MG tablet Take 10 mg by mouth daily.     . sitaGLIPtin-metformin (JANUMET) 50-1000 MG per tablet take 1 tablet by mouth twice a day WITH A MEAL. 180 tablet 3  . tamsulosin (FLOMAX) 0.4 MG CAPS capsule Take 0.4 mg by mouth daily after breakfast.      No current facility-administered medications for this visit.    SURGICAL HISTORY:  Past Surgical History  Procedure Laterality Date  . Aortic valve replacement    . Foot arthrotomy Right   . Tonsillectomy    . Cataract extraction, bilateral    . Humerus  fracture surgery    . Replacement total knee Bilateral   . Toe amputation      rt 2nd  . Cardiac catheterization    . Amputation  01/19/2012    Procedure: AMPUTATION DIGIT;  Surgeon: Colin Rhein, MD;  Location: [facility];  Service: Orthopedics;  Laterality: Right;  right great toe amputation through MTP joint  . Rotator cuff repair Right   . Enteroscopy N/A 10/15/2014    Procedure: ENTEROSCOPY;  Surgeon: Gatha Mayer, MD;  Location: [facility];  Service: Endoscopy;  Laterality: N/A;  . Colonoscopy with propofol N/A 10/17/2014    Procedure: COLONOSCOPY WITH PROPOFOL;  Surgeon: Gatha Mayer, MD;  Location: [facility];  Service: Endoscopy;  Laterality: N/A;  . Abdominal aortagram N/A 02/25/2012    Procedure: ABDOMINAL AORTAGRAM;  Surgeon: Elam Dutch, MD;  Location: [facility];  Service: Cardiovascular;  Laterality: N/A;    REVIEW OF SYSTEMS:  A comprehensive review of systems was negative except for: Constitutional: positive for fatigue   PHYSICAL EXAMINATION: General appearance: alert, cooperative, fatigued and no distress Head: Normocephalic, without obvious abnormality, atraumatic Neck: no adenopathy, no JVD, supple, symmetrical, trachea midline and thyroid not enlarged, symmetric, no tenderness/mass/nodules Lymph nodes: Cervical, supraclavicular, and axillary nodes normal. Resp: clear to auscultation bilaterally Back: symmetric, no curvature. ROM normal. No CVA tenderness. Cardio: Normal S1 and S2 with systolic murmur GI: soft, non-tender; bowel sounds normal; no masses,  no organomegaly Extremities: extremities normal, atraumatic, no cyanosis or edema  ECOG PERFORMANCE STATUS: 1 - Symptomatic but completely ambulatory  Blood pressure 141/48, pulse 66, temperature 98.2 F (36.8 C), temperature source Oral, resp. rate 18, height 5\' 6"  (1.676 m), weight 187 lb 12.8 oz (85.186 kg), SpO2 100 %.  LABORATORY DATA: Lab Results  Component Value Date   WBC  5.9 03/05/2015   HGB 11.1* 03/05/2015   HCT 34.9* 03/05/2015   MCV 98.4* 03/05/2015   PLT 188 03/05/2015      Chemistry      Component Value Date/Time   NA 135* 10/16/2014 0517   NA 130* 10/09/2014 1123   K 4.8 10/16/2014 0517   K 5.3* 10/09/2014 1123   CL 100 10/16/2014 0517   CO2 22 10/16/2014 0517   CO2 21* 10/09/2014 1123   BUN 18 10/16/2014 0517   BUN 31.2* 10/09/2014 1123   CREATININE 0.78 10/16/2014 0517   CREATININE 1.4* 10/09/2014 1123      Component Value Date/Time   CALCIUM 9.3 10/16/2014 0517   CALCIUM 9.3 10/09/2014 1123   ALKPHOS 73 10/14/2014 1456   ALKPHOS 70 10/09/2014 1123   AST 18 10/14/2014 1456   AST 17 10/09/2014 1123   ALT 16 10/14/2014 1456   ALT 15 10/09/2014  1123   BILITOT <0.2* 10/14/2014 1456   BILITOT 0.22 10/09/2014 1123     Other lab results: Ferritin 171, serum iron 81, total iron binding capacity 291 and iron saturation 28%.  RADIOGRAPHIC STUDIES: No results found.  ASSESSMENT AND PLAN: This is a very pleasant 81 years old white male with iron deficiency anemia status post Feraheme infusion and currently on Integra plus 1 capsule by mouth daily and tolerating it fairly well. The patient improvement in his hemoglobin and hematocrit as well as stable iron study and ferritin. I discussed the lab result with the patient today and recommended for him to continue on Integra plus for now. I will see him back for follow-up visit in 2 months with repeat CBC, iron study and ferritin. He was advised to call immediately if he has any concerning symptoms in the interval. The patient voices understanding of current disease status and treatment options and is in agreement with the current care plan.  All questions were answered. The patient knows to call the clinic with any problems, questions or concerns. We can certainly see the patient much sooner if necessary.  Disclaimer: This note was dictated with voice recognition software. Similar sounding  words can inadvertently be transcribed and may not be corrected upon review.

## 2015-04-04 ENCOUNTER — Ambulatory Visit: Payer: Self-pay | Admitting: Cardiology

## 2015-04-28 ENCOUNTER — Telehealth: Payer: Self-pay | Admitting: Family Medicine

## 2015-04-28 ENCOUNTER — Other Ambulatory Visit (HOSPITAL_BASED_OUTPATIENT_CLINIC_OR_DEPARTMENT_OTHER): Payer: Medicare Other

## 2015-04-28 DIAGNOSIS — D509 Iron deficiency anemia, unspecified: Secondary | ICD-10-CM

## 2015-04-28 LAB — CBC WITH DIFFERENTIAL/PLATELET
BASO%: 0.2 % (ref 0.0–2.0)
Basophils Absolute: 0 10*3/uL (ref 0.0–0.1)
EOS%: 1.1 % (ref 0.0–7.0)
Eosinophils Absolute: 0.2 10*3/uL (ref 0.0–0.5)
HEMATOCRIT: 38.6 % (ref 38.4–49.9)
HGB: 12.6 g/dL — ABNORMAL LOW (ref 13.0–17.1)
LYMPH%: 6.4 % — AB (ref 14.0–49.0)
MCH: 31.7 pg (ref 27.2–33.4)
MCHC: 32.6 g/dL (ref 32.0–36.0)
MCV: 97 fL (ref 79.3–98.0)
MONO#: 1.3 10*3/uL — ABNORMAL HIGH (ref 0.1–0.9)
MONO%: 8.5 % (ref 0.0–14.0)
NEUT#: 12.8 10*3/uL — ABNORMAL HIGH (ref 1.5–6.5)
NEUT%: 83.8 % — AB (ref 39.0–75.0)
PLATELETS: 244 10*3/uL (ref 140–400)
RBC: 3.98 10*6/uL — ABNORMAL LOW (ref 4.20–5.82)
RDW: 13.3 % (ref 11.0–14.6)
WBC: 15.2 10*3/uL — ABNORMAL HIGH (ref 4.0–10.3)
lymph#: 1 10*3/uL (ref 0.9–3.3)

## 2015-04-28 MED ORDER — HYDROCODONE-ACETAMINOPHEN 10-325 MG PO TABS: 1.0000 | ORAL_TABLET | Freq: Four times a day (QID) | ORAL | Status: DC | PRN

## 2015-04-28 NOTE — Telephone Encounter (Signed)
Refill request for Norco and pt would like to pick up today.

## 2015-04-28 NOTE — Telephone Encounter (Signed)
done

## 2015-04-28 NOTE — Telephone Encounter (Signed)
Script is ready for pick up and I spoke with pt.  

## 2015-04-29 ENCOUNTER — Telehealth: Payer: Self-pay | Admitting: *Deleted

## 2015-04-29 LAB — IRON AND TIBC CHCC
%SAT: 16 % — ABNORMAL LOW (ref 20–55)
Iron: 51 ug/dL (ref 42–163)
TIBC: 325 ug/dL (ref 202–409)
UIBC: 275 ug/dL (ref 117–376)

## 2015-04-29 LAB — FERRITIN CHCC: Ferritin: 114 ng/ml (ref 22–316)

## 2015-04-29 NOTE — Telephone Encounter (Signed)
TC from patient requesting lab results from yesterday.  Reviewed HGB with patient (his request) and he is very satisfied with this result. He will see Dr. Julien Nordmann next week

## 2015-05-05 ENCOUNTER — Ambulatory Visit (HOSPITAL_BASED_OUTPATIENT_CLINIC_OR_DEPARTMENT_OTHER): Payer: Medicare Other | Admitting: Internal Medicine

## 2015-05-05 ENCOUNTER — Telehealth: Payer: Self-pay | Admitting: Internal Medicine

## 2015-05-05 ENCOUNTER — Encounter: Payer: Self-pay | Admitting: Internal Medicine

## 2015-05-05 VITALS — BP 126/65 | HR 97 | Temp 98.2°F | Resp 17 | Ht 66.0 in | Wt 186.0 lb

## 2015-05-05 DIAGNOSIS — D509 Iron deficiency anemia, unspecified: Secondary | ICD-10-CM

## 2015-05-05 NOTE — Progress Notes (Signed)
[facility] [phone]   Fax:(336) [phone]  OFFICE PROGRESS NOTE  [name], MD [place] [address]  DIAGNOSIS: Iron deficiency anemia.  PRIOR THERAPY: Feraheme 510 mg IV weekly x 2, last dose was given on 10/18/2014.  CURRENT THERAPY: Integra plus 1 capsule by mouth daily.  INTERVAL HISTORY: Stanley [name] 81 y.o. male returns to the clinic today for follow-up visit. The patient is feeling fine with no specific complaints. He has more energy days. The patient is currently on Integra plus 1 capsule by mouth daily and also tolerating it well. He denied having any significant chest pain, cough or hemoptysis. He denied having any disease status. He has no bleeding issues. He had repeat CBC and iron study performed recently and he is here for evaluation and discussion of his lab results.  MEDICAL HISTORY: Past Medical History  Diagnosis Date  . Diabetes mellitus   . BPH (benign prostatic hyperplasia)   . Arthritis   . GERD (gastroesophageal reflux disease)   . Neuromuscular disorder   . Hypertension     no meds  . Shortness of breath     uses cpap  . 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 Failed total knee, right   . H pylori ulcer     Gastric ulcer  . CAD (coronary artery disease)  s/p CABG   . Aortic stenosis     S/p pericardial valve  . Anemia   . Mobitz type 1 second degree atrioventricular block     ALLERGIES:  is allergic to morphine and related; succinylcholine; and ace inhibitors.  MEDICATIONS:  Current Outpatient Prescriptions  Medication Sig Dispense Refill  . ALPRAZolam (XANAX) 0.25 MG tablet Take 1 tablet (0.25 mg total) by mouth every 6 (six) hours as needed. 120 tablet 5  . aspirin 81 MG tablet Take 81 mg by mouth daily.    . Calcium Carbonate-Vitamin D (CALCIUM-VITAMIN D) 500-200 MG-UNIT per tablet Take 1 tablet by  mouth 2 (two) times daily.     . diclofenac (VOLTAREN) 75 MG EC tablet Take 75 mg by mouth 2 (two) times daily.     . diphenoxylate-atropine (LOMOTIL) 2.5-0.025 MG per tablet TAKE 1 TABLET 4 TIMES DAILY AS NEEDED FOR DIARRHEA OR LOOSE STOOLS. 60 tablet 3  . FeFum-FePoly-FA-B Cmp-C-Biot (INTEGRA PLUS) CAPS TAKE 1 CAPSULE EVERY MORNING 90 capsule 3  . glucose blood (ONE TOUCH TEST STRIPS) test strip Dispense one touch ultra, test TID and diagnosis code is E11.9 100 each 11  . HYDROcodone-acetaminophen (NORCO) 10-325 MG per tablet Take 1 tablet by mouth every 6 (six) hours as needed for severe pain. 120 tablet 0  . Lancets (ONETOUCH ULTRASOFT) lancets TEST 3 TIMES DAILY AS DIRECTED. 100 each PRN  . mirabegron ER (MYRBETRIQ) 50 MG TB24 tablet Take 50 mg by mouth daily.    . Multiple Vitamin (MULITIVITAMIN WITH MINERALS) TABS Take 1 tablet by mouth daily.    . pantoprazole (PROTONIX) 40 MG tablet Take 1 tablet (40 mg total) by mouth 2 (two) times daily. 60 tablet 11  . pioglitazone (ACTOS) 45 MG tablet Take 1 tablet (45 mg total) by mouth daily. 90 tablet 3  . pramipexole (MIRAPEX) 0.5 MG tablet Take 3 tablets (1.5 mg total) by mouth at bedtime. 90 tablet 2  . predniSONE (STERAPRED UNI-PAK) 5 MG TABS tablet as needed.  0  . prochlorperazine (COMPAZINE) 10  MG tablet take 1 tablet by mouth four times a day as needed for nausea    . rosuvastatin (CRESTOR) 20 MG tablet Take 10 mg by mouth daily.     . sitaGLIPtin-metformin (JANUMET) 50-1000 MG per tablet take 1 tablet by mouth twice a day WITH A MEAL. 180 tablet 3  . tamsulosin (FLOMAX) 0.4 MG CAPS capsule Take 0.4 mg by mouth daily after breakfast.      No current facility-administered medications for this visit.    SURGICAL HISTORY:  Past Surgical History  Procedure Laterality Date  . Aortic valve replacement    . Foot arthrotomy Right   . Tonsillectomy    . Cataract extraction, bilateral    . Humerus fracture surgery    . Replacement total  knee Bilateral   . Toe amputation      rt 2nd  . Cardiac catheterization    . Amputation  01/19/2012    Procedure: AMPUTATION DIGIT;  Surgeon: Colin Rhein, MD;  Location: [facility];  Service: Orthopedics;  Laterality: Right;  right great toe amputation through MTP joint  . Rotator cuff repair Right   . Enteroscopy N/A 10/15/2014    Procedure: ENTEROSCOPY;  Surgeon: Gatha Mayer, MD;  Location: [facility];  Service: Endoscopy;  Laterality: N/A;  . Colonoscopy with propofol N/A 10/17/2014    Procedure: COLONOSCOPY WITH PROPOFOL;  Surgeon: Gatha Mayer, MD;  Location: [facility];  Service: Endoscopy;  Laterality: N/A;  . Abdominal aortagram N/A 02/25/2012    Procedure: ABDOMINAL AORTAGRAM;  Surgeon: Elam Dutch, MD;  Location: [facility] CATH LAB;  Service: Cardiovascular;  Laterality: N/A;    REVIEW OF SYSTEMS:  A comprehensive review of systems was negative.   PHYSICAL EXAMINATION: General appearance: alert, cooperative, fatigued and no distress Head: Normocephalic, without obvious abnormality, atraumatic Neck: no adenopathy, no JVD, supple, symmetrical, trachea midline and thyroid not enlarged, symmetric, no tenderness/mass/nodules Lymph nodes: Cervical, supraclavicular, and axillary nodes normal. Resp: clear to auscultation bilaterally Back: symmetric, no curvature. ROM normal. No CVA tenderness. Cardio: Normal S1 and S2 with systolic murmur GI: soft, non-tender; bowel sounds normal; no masses,  no organomegaly Extremities: extremities normal, atraumatic, no cyanosis or edema  ECOG PERFORMANCE STATUS: 1 - Symptomatic but completely ambulatory  Blood pressure 126/65, pulse 97, temperature 98.2 F (36.8 C), temperature source Oral, resp. rate 17, height 5\' 6"  (1.676 m), weight 186 lb (84.369 kg), SpO2 96 %.  LABORATORY DATA: Lab Results  Component Value Date   WBC 15.2* 04/28/2015   HGB 12.6* 04/28/2015   HCT 38.6 04/28/2015   MCV 97.0 04/28/2015   PLT 244  04/28/2015      Chemistry      Component Value Date/Time   NA 135* 10/16/2014 0517   NA 130* 10/09/2014 1123   K 4.8 10/16/2014 0517   K 5.3* 10/09/2014 1123   CL 100 10/16/2014 0517   CO2 22 10/16/2014 0517   CO2 21* 10/09/2014 1123   BUN 18 10/16/2014 0517   BUN 31.2* 10/09/2014 1123   CREATININE 0.78 10/16/2014 0517   CREATININE 1.4* 10/09/2014 1123      Component Value Date/Time   CALCIUM 9.3 10/16/2014 0517   CALCIUM 9.3 10/09/2014 1123   ALKPHOS 73 10/14/2014 1456   ALKPHOS 70 10/09/2014 1123   AST 18 10/14/2014 1456   AST 17 10/09/2014 1123   ALT 16 10/14/2014 1456   ALT 15 10/09/2014 1123   BILITOT <0.2* 10/14/2014 1456   BILITOT 0.22  10/09/2014 1123     Other lab results: Ferritin 114, serum iron 51, total iron binding capacity 275 and iron saturation 16%.  RADIOGRAPHIC STUDIES: No results found.  ASSESSMENT AND PLAN: This is a very pleasant 81 years old white male with iron deficiency anemia status post Feraheme infusion and currently on Integra plus 1 capsule by mouth daily and tolerating it fairly well. The patient is feeling better. He has improvement in his hemoglobin and hematocrit as well as stable iron study and ferritin. I discussed the lab result with the patient today and recommended for him to continue on Integra plus for now. I will see him back for follow-up visit in 3 months with repeat CBC, iron study and ferritin. He was advised to call immediately if he has any concerning symptoms in the interval. The patient voices understanding of current disease status and treatment options and is in agreement with the current care plan.  All questions were answered. The patient knows to call the clinic with any problems, questions or concerns. We can certainly see the patient much sooner if necessary.  Disclaimer: This note was dictated with voice recognition software. Similar sounding words can inadvertently be transcribed and may not be corrected upon  review.

## 2015-05-05 NOTE — Telephone Encounter (Signed)
per pof to sch pt appt-gave pt copy of sch °

## 2015-05-08 ENCOUNTER — Telehealth: Payer: Self-pay | Admitting: Family Medicine

## 2015-05-08 ENCOUNTER — Other Ambulatory Visit: Payer: Self-pay | Admitting: Family Medicine

## 2015-05-08 DIAGNOSIS — E119 Type 2 diabetes mellitus without complications: Secondary | ICD-10-CM

## 2015-05-08 NOTE — Telephone Encounter (Signed)
Pt requesting a lab order for A1c. Per Dr. Sarajane Jews okay to order and I did put in a future order.

## 2015-05-09 ENCOUNTER — Other Ambulatory Visit (INDEPENDENT_AMBULATORY_CARE_PROVIDER_SITE_OTHER): Payer: Medicare Other

## 2015-05-09 DIAGNOSIS — E119 Type 2 diabetes mellitus without complications: Secondary | ICD-10-CM

## 2015-05-10 LAB — HEMOGLOBIN A1C
Hgb A1c MFr Bld: 6.9 % — ABNORMAL HIGH (ref <5.7)
Mean Plasma Glucose: 151 mg/dL — ABNORMAL HIGH (ref <117)

## 2015-05-14 ENCOUNTER — Other Ambulatory Visit: Payer: Self-pay | Admitting: Family Medicine

## 2015-05-15 NOTE — Telephone Encounter (Signed)
Call in #[phone] with 5 rf 

## 2015-06-12 ENCOUNTER — Ambulatory Visit (INDEPENDENT_AMBULATORY_CARE_PROVIDER_SITE_OTHER): Payer: Medicare Other | Admitting: Cardiology

## 2015-06-12 ENCOUNTER — Encounter: Payer: Self-pay | Admitting: *Deleted

## 2015-06-12 ENCOUNTER — Other Ambulatory Visit: Payer: Self-pay | Admitting: Cardiology

## 2015-06-12 ENCOUNTER — Encounter: Payer: Self-pay | Admitting: Cardiology

## 2015-06-12 VITALS — BP 122/60 | HR 69 | Ht 66.0 in | Wt 180.0 lb

## 2015-06-12 DIAGNOSIS — I6523 Occlusion and stenosis of bilateral carotid arteries: Secondary | ICD-10-CM

## 2015-06-12 DIAGNOSIS — I7025 Atherosclerosis of native arteries of other extremities with ulceration: Secondary | ICD-10-CM

## 2015-06-12 DIAGNOSIS — Z954 Presence of other heart-valve replacement: Secondary | ICD-10-CM | POA: Diagnosis not present

## 2015-06-12 DIAGNOSIS — I739 Peripheral vascular disease, unspecified: Secondary | ICD-10-CM | POA: Diagnosis not present

## 2015-06-12 DIAGNOSIS — I2510 Atherosclerotic heart disease of native coronary artery without angina pectoris: Secondary | ICD-10-CM

## 2015-06-12 DIAGNOSIS — Z952 Presence of prosthetic heart valve: Secondary | ICD-10-CM

## 2015-06-12 DIAGNOSIS — I441 Atrioventricular block, second degree: Secondary | ICD-10-CM

## 2015-06-12 DIAGNOSIS — I5022 Chronic systolic (congestive) heart failure: Secondary | ICD-10-CM

## 2015-06-12 DIAGNOSIS — L98499 Non-pressure chronic ulcer of skin of other sites with unspecified severity: Secondary | ICD-10-CM

## 2015-06-12 LAB — LIPID PANEL
CHOL/HDL RATIO: 3
Cholesterol: 136 mg/dL (ref 0–200)
HDL: 48.7 mg/dL (ref >39.00)
NONHDL: 87.3
TRIGLYCERIDES: 220 mg/dL — AB (ref 0.0–149.0)
VLDL: 44 mg/dL — ABNORMAL HIGH (ref 0.0–40.0)

## 2015-06-12 LAB — BASIC METABOLIC PANEL
BUN: 39 mg/dL — ABNORMAL HIGH (ref 6–23)
CO2: 23 meq/L (ref 19–32)
Calcium: 9.2 mg/dL (ref 8.4–10.5)
Chloride: 102 mEq/L (ref 96–112)
Creatinine, Ser: 1.2 mg/dL (ref 0.40–1.50)
GFR: 60.78 mL/min (ref >60.00)
Glucose, Bld: 148 mg/dL — ABNORMAL HIGH (ref 70–99)
Potassium: 4.8 mEq/L (ref 3.5–5.1)
SODIUM: 133 meq/L — AB (ref 135–145)

## 2015-06-12 LAB — LDL CHOLESTEROL, DIRECT: Direct LDL: 57 mg/dL

## 2015-06-12 NOTE — Patient Instructions (Signed)
Medication Instructions:  No changes today  Labwork: BMET/Lipid profile today  Testing/Procedures: Your physician has requested that you have a carotid duplex. This test is an ultrasound of the carotid arteries in your neck. It looks at blood flow through these arteries that supply the brain with blood. Allow one hour for this exam. There are no restrictions or special instructions.    Follow-Up: Your physician wants you to follow-up in: 6 months with Dr Aundra Dubin. (December 2016).  You will receive a reminder letter in the mail two months in advance. If you don't receive a letter, please call our office to schedule the follow-up appointment.    Thank you for choosing [facility]!!

## 2015-06-13 NOTE — Progress Notes (Signed)
Patient ID: Stanley [name], male   DOB: Oct 04, 1925, 81 y.o.   MRN: [id number] PCP: Dr. Sarajane Jews  81 yo with history of PAD, CABG/AVR, diabetes, ischemic cardiomyopathy, and iron deficiency anemia presents for cardiology followup.   He has severe, unrevascularizable bilateral tibial disease.  Legs are weak but he denies significant claudication-type symptoms. He was admitted to [facility] with symptomatic anemia (dyspnea) in 10/15.  He was transfused and had EGD with enteroscopy and colonoscopy.  These tests did not show a definite cause for blood loss. While in the hospital, he was noted to be bradycardic as low as the 30s while sleeping and to have episodes of type I 2nd degree AV block.  He was asymptomatic.  30 day monitor after discharge showed an episode of type I 2nd degree AV block but no more worrisome arrhythmias.   He is doing ok symptomatically.  His low back has been hurting recently and has limited his ambulation. He uses a walker when he is outside the house for stability.  Mild dyspnea after walking 100+ yards.  No chest pain.  No lightheadedness or syncope.  No orthopnea or PND.  Last echo showed improvement in EF to 45-50%.  He is no longer taking losartan as he was hypotensive and orthostatic on a low dose.   Labs (8/14): K 4.4, creatinine 0.9 Labs (11/14): K 4.2, creatinine 0.6 Labs (2/15): LDL 80, HDL 46 Labs (10/15): HCT 30.6, K 4.8, creatinine 0.78, pro-BNP 936 Labs (12/15): HCT 35.5 Lbas (5/16); HCT 38.6  ECG: NSR, 1st degree AVB, PVCs  PMH: 1. PAD: peripheral angiography (3/13) showed severe, unreconstructable bilateral tibial artery disease.  He had a chronic ulcer on his right foot that has actually now healed.  He did lose 2 toes on his right foot.  2. Orthostatic hypotension 3. Carotid dopplers (3/14) with 0-39% bilateral stenosis.  4. OSA: Uses CPAP.  5. GERD 6. BPH 7. Type II diabetes 8. H/o HTN but not on meds for this now.  ACEI cough.  9. Anemia: Fe deficiency.   10/15 admission with anemia.  EGD and enteroscopy were unremarkable colonoscopy showed a cecal polyp and diverticulosis.  10. CAD: CABG at time of AVR in 8/11 with LIMA-LAD and SVG-PDA.  11. Aortic stenosis: Bioprosthetic AVR in 8/11.  Pre-op echo in 3/11 showed EF 60-65% with severe AS.  Bioprosthetic aortic valve looked ok on 11/14 echo.  12. Hyperlipidemia: Myalgias with Crestor 20 mg daily but can tolerate 10 mg daily.  13. Ischemic cardiomyopathy: Echo (11/14) with EF 35-40%, mild LVH, bioprosthetic aortic valve with no significant AS or AI, mild MR, mildly dilated RV with mild to moderately decreased RV systolic function. Echo (10/15) with EF 45-50%, normal bioprosthetic aortic valve.  14. Low back pain 15. Bradycardia, Mobitz type I 2nd degree AV block.  Event monitor (11/15) showed 1 episode of type I 2nd degree AV block.   SH: Married, retired Engineer, drilling, occasional ETOH, no smoking.   FH: Type II diabetes  ROS: All systems reviewed and negative except as per HPI.   Current Outpatient Prescriptions  Medication Sig Dispense Refill  . ALPRAZolam (XANAX) 0.25 MG tablet take 1 tablet by mouth every 6 hours if needed 120 tablet 5  . aspirin 81 MG tablet Take 81 mg by mouth daily.    . Calcium Carbonate-Vitamin D (CALCIUM-VITAMIN D) 500-200 MG-UNIT per tablet Take 1 tablet by mouth 2 (two) times daily.     . chlorhexidine (PERIDEX) 0.12 % solution 2  mLs by Mouth Rinse route 2 (two) times daily.  0  . diclofenac (VOLTAREN) 75 MG EC tablet Take 75 mg by mouth 2 (two) times daily.     . diphenoxylate-atropine (LOMOTIL) 2.5-0.025 MG per tablet TAKE 1 TABLET 4 TIMES DAILY AS NEEDED FOR DIARRHEA OR LOOSE STOOLS. 60 tablet 3  . FeFum-FePoly-FA-B Cmp-C-Biot (INTEGRA PLUS) CAPS TAKE 1 CAPSULE EVERY MORNING 90 capsule 3  . finasteride (PROSCAR) 5 MG tablet Take 5 mg by mouth daily.  1  . glucose blood (ONE TOUCH TEST STRIPS) test strip Dispense one touch ultra, test TID and diagnosis code is E11.9  100 each 11  . HYDROcodone-acetaminophen (NORCO) 10-325 MG per tablet Take 1 tablet by mouth every 6 (six) hours as needed for severe pain. 120 tablet 0  . Lancets (ONETOUCH ULTRASOFT) lancets TEST 3 TIMES DAILY AS DIRECTED. 100 each PRN  . mirabegron ER (MYRBETRIQ) 50 MG TB24 tablet Take 50 mg by mouth daily.    . Multiple Vitamin (MULITIVITAMIN WITH MINERALS) TABS Take 1 tablet by mouth daily.    . pantoprazole (PROTONIX) 40 MG tablet Take 1 tablet (40 mg total) by mouth 2 (two) times daily. 60 tablet 11  . pioglitazone (ACTOS) 45 MG tablet Take 1 tablet (45 mg total) by mouth daily. 90 tablet 3  . predniSONE (STERAPRED UNI-PAK) 5 MG TABS tablet as needed.  0  . prochlorperazine (COMPAZINE) 10 MG tablet take 1 tablet by mouth four times a day as needed for nausea    . rosuvastatin (CRESTOR) 20 MG tablet Take 10 mg by mouth daily.     . sitaGLIPtin-metformin (JANUMET) 50-1000 MG per tablet take 1 tablet by mouth twice a day WITH A MEAL. 180 tablet 3  . tamsulosin (FLOMAX) 0.4 MG CAPS capsule Take 0.4 mg by mouth daily after breakfast.      No current facility-administered medications for this visit.    BP 122/60 mmHg  Pulse 69  Ht 5\' 6"  (1.676 m)  Wt 180 lb (81.647 kg)  BMI 29.07 kg/m2 General: NAD Neck: No JVD, no thyromegaly or thyroid nodule.  Lungs: Clear to auscultation bilaterally with normal respiratory effort. CV: Nondisplaced PMI.  Heart regular S1/S2, no S3/S4, 2/6 early SEM RUSB.  No edema.  No carotid bruit.  I am unable to palpate pedal pulses.   Abdomen: Soft, nontender, no hepatosplenomegaly, no distention.  Skin: Intact without lesions or rashes.  Neurologic: Alert and oriented x 3.  Psych: Normal affect. Extremities: No clubbing or cyanosis.   Assessment/Plan: 1. CAD: S/p CABG with AVR in 8/11.  No chest pain.    - Continue Crestor and ASA 81 mg daily.   - He is taking diclofenac, which is not ideal with CAD.  We discussed this and he has opted to continue to take  it given poor response to Tylenol.  2. Orthostatic hypotension: Possibly due to diabetic autonomic neuropathy.  Improved off losartan.  3. Hyperlipidemia: Check lipids today.  4. PAD: Right foot ulcer has healed.  The tibial system did not appear to be revascularizable on prior peripheral angiography. Legs are weak but he denies claudication.   5. Bioprosthetic aortic valve: 10/15 echo showed stable bioprosthetic valve.  6. Carotid stenosis: I will arrange for repeat carotid dopplers. 7. Chronic systolic CHF: EF 60-10% on 11/14 echo but improved to 45-50% in 10/15.  Probably ischemic cardiomyopathy.  NYHA class II symptoms.  He is not volume overloaded on exam.  Unable to tolerate losartan due to hypotension,  no beta blocker with bradycardia. 8. Bradycardia: Sinus bradycardia during sleep as well as type I 2nd degree AV block noted on telemetry during last hospitalization.  Event monitor also showed only type I 2nd degree AV block . No indication for a pacemaker. Avoid nodal blockers.  9. Anemia: Fe deficiency.  Possible small bowel AVM.    Followup 6 months.   Loralie Champagne 06/13/2015

## 2015-06-16 ENCOUNTER — Other Ambulatory Visit: Payer: Self-pay

## 2015-06-17 ENCOUNTER — Encounter (HOSPITAL_COMMUNITY): Payer: Self-pay

## 2015-06-20 ENCOUNTER — Encounter (HOSPITAL_COMMUNITY): Payer: Self-pay

## 2015-06-24 ENCOUNTER — Ambulatory Visit (HOSPITAL_COMMUNITY): Payer: Medicare Other | Attending: Cardiology

## 2015-06-24 DIAGNOSIS — I6523 Occlusion and stenosis of bilateral carotid arteries: Secondary | ICD-10-CM | POA: Diagnosis present

## 2015-06-30 ENCOUNTER — Encounter: Payer: Self-pay | Admitting: *Deleted

## 2015-07-02 ENCOUNTER — Other Ambulatory Visit: Payer: Self-pay | Admitting: Orthopedic Surgery

## 2015-07-02 DIAGNOSIS — M4807 Spinal stenosis, lumbosacral region: Secondary | ICD-10-CM

## 2015-07-07 ENCOUNTER — Ambulatory Visit
Admission: RE | Admit: 2015-07-07 | Discharge: 2015-07-07 | Disposition: A | Discharge status: Home / Self Care | Payer: Medicare Other | Source: Ambulatory Visit | Attending: Orthopedic Surgery | Admitting: Orthopedic Surgery

## 2015-07-07 DIAGNOSIS — M4807 Spinal stenosis, lumbosacral region: Secondary | ICD-10-CM

## 2015-07-07 MED ORDER — IOHEXOL 180 MG/ML  SOLN
17.0000 mL | Freq: Once | INTRAMUSCULAR | Status: AC | PRN
  Administered 2015-07-07: 17 mL via INTRATHECAL

## 2015-07-07 NOTE — Discharge Instructions (Signed)
Myelogram Discharge Instructions  1. Go home and rest quietly for the next 24 hours.  It is important to lie flat for the next 24 hours.  Get up only to go to the restroom.  You may lie in the bed or on a couch on your back, your stomach, your left side or your right side.  You may have one pillow under your head.  You may have pillows between your knees while you are on your side or under your knees while you are on your back.  2. DO NOT drive today.  Recline the seat as far back as it will go, while still wearing your seat belt, on the way home.  3. You may get up to go to the bathroom as needed.  You may sit up for 10 minutes to eat.  You may resume your normal diet and medications unless otherwise indicated.  Drink plenty of extra fluids today and tomorrow.  4. The incidence of a spinal headache with nausea and/or vomiting is about 5% (one in 20 patients).  If you develop a headache, lie flat and drink plenty of fluids until the headache goes away.  Caffeinated beverages may be helpful.  If you develop severe nausea and vomiting or a headache that does not go away with flat bed rest, call [phone].  5. You may resume normal activities after your 24 hours of bed rest is over; however, do not exert yourself strongly or do any heavy lifting tomorrow.  6. Call your physician for a follow-up appointment.   You may resume Compazine on Tuesday, July 08, 2015 after 1:00p.m.

## 2015-07-07 NOTE — Progress Notes (Signed)
Patient states he has been off Compazine for at least the past two days.  jkl

## 2015-07-15 ENCOUNTER — Encounter: Payer: Self-pay | Admitting: Family Medicine

## 2015-07-15 ENCOUNTER — Ambulatory Visit (INDEPENDENT_AMBULATORY_CARE_PROVIDER_SITE_OTHER): Payer: Medicare Other | Admitting: Family Medicine

## 2015-07-15 VITALS — BP 133/74 | HR 71 | Ht 66.0 in

## 2015-07-15 DIAGNOSIS — E119 Type 2 diabetes mellitus without complications: Secondary | ICD-10-CM

## 2015-07-15 DIAGNOSIS — M4806 Spinal stenosis, lumbar region: Secondary | ICD-10-CM | POA: Diagnosis not present

## 2015-07-15 DIAGNOSIS — L03115 Cellulitis of right lower limb: Secondary | ICD-10-CM | POA: Diagnosis not present

## 2015-07-15 DIAGNOSIS — I6523 Occlusion and stenosis of bilateral carotid arteries: Secondary | ICD-10-CM | POA: Diagnosis not present

## 2015-07-15 DIAGNOSIS — M48061 Spinal stenosis, lumbar region without neurogenic claudication: Secondary | ICD-10-CM

## 2015-07-15 MED ORDER — CEPHALEXIN 500 MG PO CAPS: 500.0000 mg | ORAL_CAPSULE | Freq: Four times a day (QID) | ORAL | Status: DC

## 2015-07-15 NOTE — Progress Notes (Signed)
   Subjective:    Patient ID: Stanley [name], male    DOB: [birth year], 81 y.o.   MRN: [id number]  HPI Here for refills and to ask advice about his back. He has been having increasing difficulties with low back pain and he has also recently developed weakness in the left leg which is causing him a lot of difficulty walking. He saw Dr. Gladstone Lighter who obtained a CT myelogram of the spine, and this revealed spinal stenosis which is mild at the L1-2 level but which is severe at the L2-3 level. Dr. Gladstone Lighter suggested he see a surgeon about this and he asks my advice. He has been getting around using a rollator walker. He has a fair amount of pain in the back also and he as used Norco off and on. He was started on Keflex recently by Dr. Gladstone Lighter for a small area of cellulitis on one toe on the right foot. This seems to be responding.    Review of Systems  Respiratory: Negative.   Cardiovascular: Negative.   Musculoskeletal: Positive for back pain and gait problem.  Skin: Positive for wound.  Neurological: Positive for weakness.       Objective:   Physical Exam  Constitutional: He is oriented to person, place, and time. He appears well-developed and well-nourished.  Cardiovascular: Normal rate, regular rhythm, normal heart sounds and intact distal pulses.   Pulmonary/Chest: Effort normal and breath sounds normal.  Neurological: He is alert and oriented to person, place, and time. No cranial nerve deficit. Coordination normal.          Assessment & Plan:  He has spinal stenosis and I agree he should have the surgery. This is compromising the nerves running to his legs and it could cause permanent nerve damage if it is not addressed. We will refer him to Neurosurgery. I will refill the Keflex for a 30 day supply for the cellulitis.

## 2015-07-15 NOTE — Progress Notes (Signed)
Pre visit review using our clinic review tool, if applicable. No additional management support is needed unless otherwise documented below in the visit note. 

## 2015-07-25 ENCOUNTER — Telehealth: Payer: Self-pay | Admitting: Family Medicine

## 2015-07-25 MED ORDER — HYDROCODONE-ACETAMINOPHEN 10-325 MG PO TABS: 1.0000 | ORAL_TABLET | Freq: Four times a day (QID) | ORAL | Status: DC | PRN

## 2015-07-25 NOTE — Telephone Encounter (Signed)
Pt requesting a refill on Norco, last seen 07/15/15 and refill is due.

## 2015-07-25 NOTE — Telephone Encounter (Signed)
Script is ready for pick up and I spoke with pt. I had to reprint order, was done with Dr. Barbie Banner name, needs Christa See name on script.

## 2015-07-25 NOTE — Telephone Encounter (Signed)
Ok to refill 

## 2015-07-27 ENCOUNTER — Other Ambulatory Visit: Payer: Self-pay | Admitting: Family Medicine

## 2015-07-28 ENCOUNTER — Encounter: Payer: Self-pay | Admitting: Adult Health

## 2015-07-28 ENCOUNTER — Ambulatory Visit (INDEPENDENT_AMBULATORY_CARE_PROVIDER_SITE_OTHER): Payer: Medicare Other | Admitting: Adult Health

## 2015-07-28 DIAGNOSIS — I6523 Occlusion and stenosis of bilateral carotid arteries: Secondary | ICD-10-CM

## 2015-07-28 DIAGNOSIS — H6121 Impacted cerumen, right ear: Secondary | ICD-10-CM | POA: Diagnosis not present

## 2015-07-28 NOTE — Progress Notes (Signed)
Subjective:    Patient ID: Stanley [name], male    DOB: [birth year], 81 y.o.   MRN: [id number]  HPI   81 year old gentleman presents to the office today for cerumen impaction in the right ear. He has noticed recent difficulty hearing people on the phone. He denies any pain, tinnitus or discharge.   Review of Systems  Constitutional: Negative.   HENT: Positive for hearing loss. Negative for congestion, ear discharge, ear pain and sinus pressure.   Eyes: Negative.   All other systems reviewed and are negative.  Past Medical History  Diagnosis Date  . Diabetes mellitus   . BPH (benign prostatic hyperplasia)   . Arthritis   . GERD (gastroesophageal reflux disease)   . Neuromuscular disorder   . Hypertension     no meds  . Shortness of breath     uses cpap  . 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 Failed total knee, right   . H pylori ulcer     Gastric ulcer  . CAD (coronary artery disease)  s/p CABG   . Aortic stenosis     S/p pericardial valve  . Anemia   . Mobitz type 1 second degree atrioventricular block     History   Social History  . Marital Status: Married    Spouse Name: N/A  . Number of Children: N/A  . Years of Education: N/A   Occupational History  . Not on file.   Social History Main Topics  . Smoking status: Never Smoker   . Smokeless tobacco: Never Used  . Alcohol Use: 4.2 oz/week    7 Glasses of wine per week  . Drug Use: No  . Sexual Activity: Not on file   Other Topics Concern  . Not on file   Social History Narrative   Married, retired primary care MD    + children          Past Surgical History  Procedure Laterality Date  . Aortic valve replacement    . Foot arthrotomy Right   . Tonsillectomy    . Cataract extraction, bilateral    . Humerus fracture surgery    . Replacement total knee Bilateral   . Toe amputation      rt 2nd  . Cardiac  catheterization    . Amputation  01/19/2012    Procedure: AMPUTATION DIGIT;  Surgeon: Colin Rhein, MD;  Location: [facility];  Service: Orthopedics;  Laterality: Right;  right great toe amputation through MTP joint  . Rotator cuff repair Right   . Enteroscopy N/A 10/15/2014    Procedure: ENTEROSCOPY;  Surgeon: Gatha Mayer, MD;  Location: [facility];  Service: Endoscopy;  Laterality: N/A;  . Colonoscopy with propofol N/A 10/17/2014    Procedure: COLONOSCOPY WITH PROPOFOL;  Surgeon: Gatha Mayer, MD;  Location: [facility];  Service: Endoscopy;  Laterality: N/A;  . Abdominal aortagram N/A 02/25/2012    Procedure: ABDOMINAL AORTAGRAM;  Surgeon: Elam Dutch, MD;  Location: [facility] CATH [facility];  Service: Cardiovascular;  Laterality: N/A;    Family History  Problem Relation Age of Onset  . Diabetes type II Mother   . Diabetes type II Father   . Colon cancer Neg Hx   . Esophageal cancer Neg Hx   . Rectal cancer Neg Hx   . Stomach cancer Neg Hx   . Heart attack Neg Hx   . Stroke  Neg Hx   . Hypertension Mother   . Hypertension Father   . Diabetes Father     Allergies  Allergen Reactions  . Morphine And Related Nausea And Vomiting  . Succinylcholine Other (See Comments)    unknown  . Ace Inhibitors Other (See Comments)    cough    Current Outpatient Prescriptions on File Prior to Visit  Medication Sig Dispense Refill  . ALPRAZolam (XANAX) 0.25 MG tablet take 1 tablet by mouth every 6 hours if needed 120 tablet 5  . aspirin 81 MG tablet Take 81 mg by mouth daily.    . Calcium Carbonate-Vitamin D (CALCIUM-VITAMIN D) 500-200 MG-UNIT per tablet Take 1 tablet by mouth 2 (two) times daily.     . chlorhexidine (PERIDEX) 0.12 % solution 2 mLs by Mouth Rinse route 2 (two) times daily.  0  . diclofenac (VOLTAREN) 75 MG EC tablet Take 75 mg by mouth 2 (two) times daily.     . diphenoxylate-atropine (LOMOTIL) 2.5-0.025 MG per tablet TAKE 1 TABLET 4 TIMES DAILY AS NEEDED FOR  DIARRHEA OR LOOSE STOOLS. 60 tablet 3  . FeFum-FePoly-FA-B Cmp-C-Biot (INTEGRA PLUS) CAPS TAKE 1 CAPSULE EVERY MORNING 90 capsule 3  . finasteride (PROSCAR) 5 MG tablet Take 5 mg by mouth daily.  1  . glucose blood (ONE TOUCH TEST STRIPS) test strip Dispense one touch ultra, test TID and diagnosis code is E11.9 100 each 11  . HYDROcodone-acetaminophen (NORCO) 10-325 MG per tablet Take 1 tablet by mouth every 6 (six) hours as needed for severe pain. 120 tablet 0  . Lancets (ONETOUCH ULTRASOFT) lancets TEST 3 TIMES DAILY AS DIRECTED. 100 each PRN  . mirabegron ER (MYRBETRIQ) 50 MG TB24 tablet Take 50 mg by mouth daily.    . Multiple Vitamin (MULITIVITAMIN WITH MINERALS) TABS Take 1 tablet by mouth daily.    . pantoprazole (PROTONIX) 40 MG tablet Take 1 tablet (40 mg total) by mouth 2 (two) times daily. 60 tablet 11  . pioglitazone (ACTOS) 45 MG tablet Take 1 tablet (45 mg total) by mouth daily. 90 tablet 3  . predniSONE (STERAPRED UNI-PAK) 5 MG TABS tablet as needed.  0  . prochlorperazine (COMPAZINE) 10 MG tablet take 1 tablet by mouth four times a day as needed for nausea    . rosuvastatin (CRESTOR) 20 MG tablet Take 10 mg by mouth daily.     . sitaGLIPtin-metformin (JANUMET) 50-1000 MG per tablet take 1 tablet by mouth twice a day WITH A MEAL. 180 tablet 3  . tamsulosin (FLOMAX) 0.4 MG CAPS capsule Take 0.4 mg by mouth daily after breakfast.      No current facility-administered medications on file prior to visit.    There were no vitals taken for this visit.       Objective:   Physical Exam  Constitutional: He is oriented to person, place, and time. He appears well-developed and well-nourished. No distress.  HENT:  Head: Normocephalic and atraumatic.  Right Ear: External ear normal.  Left Ear: External ear normal.  Nose: Nose normal.  Mouth/Throat: Oropharynx is clear and moist. No oropharyngeal exudate.  Severe cerumen impaction in right ear. No cerumen impaction in left ear.    Eyes: Conjunctivae and EOM are normal. Pupils are equal, round, and reactive to light. Right eye exhibits no discharge. Left eye exhibits no discharge.  Neurological: He is alert and oriented to person, place, and time.  Skin: Skin is dry. He is not diaphoretic.  Psychiatric: He has a  normal mood and affect. His behavior is normal. Judgment and thought content normal.  Nursing note and vitals reviewed.      Assessment & Plan:  1. Cerumen impaction, right -Right ear canal irrigated with warm tap water. Impaction able to be removed without difficulty - Patient endorses hearing better after cerumen disimpacted.

## 2015-07-30 ENCOUNTER — Other Ambulatory Visit: Payer: Self-pay

## 2015-07-31 ENCOUNTER — Other Ambulatory Visit (HOSPITAL_BASED_OUTPATIENT_CLINIC_OR_DEPARTMENT_OTHER): Payer: Medicare Other

## 2015-07-31 DIAGNOSIS — D509 Iron deficiency anemia, unspecified: Secondary | ICD-10-CM

## 2015-07-31 LAB — CBC WITH DIFFERENTIAL/PLATELET
BASO%: 0.1 % (ref 0.0–2.0)
Basophils Absolute: 0 10*3/uL (ref 0.0–0.1)
EOS%: 0.8 % (ref 0.0–7.0)
Eosinophils Absolute: 0.1 10*3/uL (ref 0.0–0.5)
HCT: 33.8 % — ABNORMAL LOW (ref 38.4–49.9)
HEMOGLOBIN: 11.3 g/dL — AB (ref 13.0–17.1)
LYMPH#: 1.1 10*3/uL (ref 0.9–3.3)
LYMPH%: 12.1 % — ABNORMAL LOW (ref 14.0–49.0)
MCH: 31.2 pg (ref 27.2–33.4)
MCHC: 33.4 g/dL (ref 32.0–36.0)
MCV: 93.4 fL (ref 79.3–98.0)
MONO#: 1.2 10*3/uL — ABNORMAL HIGH (ref 0.1–0.9)
MONO%: 13.1 % (ref 0.0–14.0)
NEUT#: 6.7 10*3/uL — ABNORMAL HIGH (ref 1.5–6.5)
NEUT%: 73.9 % (ref 39.0–75.0)
Platelets: 202 10*3/uL (ref 140–400)
RBC: 3.62 10*6/uL — AB (ref 4.20–5.82)
RDW: 13.9 % (ref 11.0–14.6)
WBC: 9.1 10*3/uL (ref 4.0–10.3)

## 2015-07-31 LAB — IRON AND TIBC CHCC
%SAT: 26 % (ref 20–55)
Iron: 82 ug/dL (ref 42–163)
TIBC: 311 ug/dL (ref 202–409)
UIBC: 230 ug/dL (ref 117–376)

## 2015-07-31 LAB — FERRITIN CHCC: Ferritin: 133 ng/ml (ref 22–316)

## 2015-08-04 ENCOUNTER — Other Ambulatory Visit: Payer: Self-pay | Admitting: Family Medicine

## 2015-08-05 ENCOUNTER — Other Ambulatory Visit: Payer: Self-pay | Admitting: Medical Oncology

## 2015-08-05 ENCOUNTER — Telehealth: Payer: Self-pay | Admitting: Internal Medicine

## 2015-08-05 NOTE — Telephone Encounter (Signed)
Appointments made and calendar has been mailed to the patient

## 2015-08-05 NOTE — Progress Notes (Signed)
This encounter was created in error - please disregard.

## 2015-08-06 ENCOUNTER — Encounter: Payer: Self-pay | Admitting: Internal Medicine

## 2015-08-06 ENCOUNTER — Telehealth: Payer: Self-pay | Admitting: Internal Medicine

## 2015-08-06 ENCOUNTER — Ambulatory Visit (HOSPITAL_BASED_OUTPATIENT_CLINIC_OR_DEPARTMENT_OTHER): Payer: Medicare Other | Admitting: Internal Medicine

## 2015-08-06 VITALS — BP 131/70 | HR 83 | Temp 98.7°F | Resp 17 | Ht 66.0 in | Wt 176.8 lb

## 2015-08-06 DIAGNOSIS — D509 Iron deficiency anemia, unspecified: Secondary | ICD-10-CM | POA: Diagnosis present

## 2015-08-06 NOTE — Progress Notes (Signed)
[place] [phone]   [phone]  OFFICE PROGRESS NOTE  [name], MD [place] [address]  DIAGNOSIS: Iron deficiency anemia.  PRIOR THERAPY: Feraheme 510 mg IV weekly x 2, last dose was given on 10/18/2014.  CURRENT THERAPY: Integra plus 1 capsule by mouth daily.  INTERVAL HISTORY: Stanley [name] 81 y.o. male returns to the clinic today for follow-up visit. The patient is feeling fine with no specific complaints. The patient is currently on Integra plus 1 capsule by mouth daily and also tolerating it well. He did not take his treatment for around a week because of loose stool but he resumed it again. Had some low back pain and weakness in the lower extremities and he was seen by neurosurgery and had epidural injection performed. He denied having any significant chest pain, shortness of breath, cough or hemoptysis. He denied having any disease status. He has no bleeding issues. He had repeat CBC and iron study performed recently and he is here for evaluation and discussion of his lab results.  MEDICAL HISTORY: Past Medical History  Diagnosis Date  . Diabetes mellitus   . BPH (benign prostatic hyperplasia)   . Arthritis   . GERD (gastroesophageal reflux disease)   . Neuromuscular disorder   . Hypertension     no meds  . Shortness of breath     uses cpap  . 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 Failed total knee, right   . H pylori ulcer     Gastric ulcer  . CAD (coronary artery disease)  s/p CABG   . Aortic stenosis     S/p pericardial valve  . Anemia   . Mobitz type 1 second degree atrioventricular block     ALLERGIES:  is allergic to morphine and related; succinylcholine; and ace inhibitors.  MEDICATIONS:  Current Outpatient Prescriptions  Medication Sig Dispense Refill  . ALPRAZolam (XANAX) 0.25 MG tablet take 1  tablet by mouth every 6 hours if needed 120 tablet 5  . aspirin 81 MG tablet Take 81 mg by mouth daily.    . Calcium Carbonate-Vitamin D (CALCIUM-VITAMIN D) 500-200 MG-UNIT per tablet Take 1 tablet by mouth 2 (two) times daily.     . chlorhexidine (PERIDEX) 0.12 % solution 2 mLs by Mouth Rinse route 2 (two) times daily.  0  . diclofenac (VOLTAREN) 75 MG EC tablet Take 75 mg by mouth 2 (two) times daily.     . diphenoxylate-atropine (LOMOTIL) 2.5-0.025 MG per tablet TAKE 1 TABLET 4 TIMES DAILY AS NEEDED FOR DIARRHEA OR LOOSE STOOLS. 60 tablet 3  . FeFum-FePoly-FA-B Cmp-C-Biot (INTEGRA PLUS) CAPS TAKE 1 CAPSULE EVERY MORNING 90 capsule 3  . finasteride (PROSCAR) 5 MG tablet Take 5 mg by mouth daily.  1  . glucose blood (ONE TOUCH TEST STRIPS) test strip Dispense one touch ultra, test TID and diagnosis code is E11.9 100 each 11  . HYDROcodone-acetaminophen (NORCO) 10-325 MG per tablet Take 1 tablet by mouth every 6 (six) hours as needed for severe pain. 120 tablet 0  . Lancets (ONETOUCH ULTRASOFT) lancets TEST 3 TIMES DAILY AS DIRECTED. 100 each PRN  . mirabegron ER (MYRBETRIQ) 50 MG TB24 tablet Take 50 mg by mouth daily.    . Multiple Vitamin (MULITIVITAMIN WITH MINERALS) TABS Take 1 tablet by mouth daily.    . pantoprazole (PROTONIX) 40 MG tablet Take 1 tablet (  40 mg total) by mouth 2 (two) times daily. 60 tablet 11  . pioglitazone (ACTOS) 45 MG tablet Take 1 tablet (45 mg total) by mouth daily. 90 tablet 3  . predniSONE (STERAPRED UNI-PAK) 5 MG TABS tablet as needed.  0  . prochlorperazine (COMPAZINE) 10 MG tablet take 1 tablet by mouth four times a day as needed for nausea    . rosuvastatin (CRESTOR) 20 MG tablet Take 10 mg by mouth daily.     . sitaGLIPtin-metformin (JANUMET) 50-1000 MG per tablet take 1 tablet by mouth twice a day WITH A MEAL. 180 tablet 3  . tamsulosin (FLOMAX) 0.4 MG CAPS capsule Take 0.4 mg by mouth daily after breakfast.      No current facility-administered medications  for this visit.    SURGICAL HISTORY:  Past Surgical History  Procedure Laterality Date  . Aortic valve replacement    . Foot arthrotomy Right   . Tonsillectomy    . Cataract extraction, bilateral    . Humerus fracture surgery    . Replacement total knee Bilateral   . Toe amputation      rt 2nd  . Cardiac catheterization    . Amputation  01/19/2012    Procedure: AMPUTATION DIGIT;  Surgeon: Colin Rhein, MD;  Location: [facility];  Service: Orthopedics;  Laterality: Right;  right great toe amputation through MTP joint  . Rotator cuff repair Right   . Enteroscopy N/A 10/15/2014    Procedure: ENTEROSCOPY;  Surgeon: Gatha Mayer, MD;  Location: [facility];  Service: Endoscopy;  Laterality: N/A;  . Colonoscopy with propofol N/A 10/17/2014    Procedure: COLONOSCOPY WITH PROPOFOL;  Surgeon: Gatha Mayer, MD;  Location: [facility];  Service: Endoscopy;  Laterality: N/A;  . Abdominal aortagram N/A 02/25/2012    Procedure: ABDOMINAL AORTAGRAM;  Surgeon: Elam Dutch, MD;  Location: [facility] CATH LAB;  Service: Cardiovascular;  Laterality: N/A;    REVIEW OF SYSTEMS:  A comprehensive review of systems was negative.   PHYSICAL EXAMINATION: General appearance: alert, cooperative, fatigued and no distress Head: Normocephalic, without obvious abnormality, atraumatic Neck: no adenopathy, no JVD, supple, symmetrical, trachea midline and thyroid not enlarged, symmetric, no tenderness/mass/nodules Lymph nodes: Cervical, supraclavicular, and axillary nodes normal. Resp: clear to auscultation bilaterally Back: symmetric, no curvature. ROM normal. No CVA tenderness. Cardio: Normal S1 and S2 with systolic murmur GI: soft, non-tender; bowel sounds normal; no masses,  no organomegaly Extremities: extremities normal, atraumatic, no cyanosis or edema  ECOG PERFORMANCE STATUS: 1 - Symptomatic but completely ambulatory  There were no vitals taken for this visit.  LABORATORY DATA: Lab  Results  Component Value Date   WBC 9.1 07/31/2015   HGB 11.3* 07/31/2015   HCT 33.8* 07/31/2015   MCV 93.4 07/31/2015   PLT 202 07/31/2015      Chemistry      Component Value Date/Time   NA 133* 06/12/2015 1408   NA 130* 10/09/2014 1123   K 4.8 06/12/2015 1408   K 5.3* 10/09/2014 1123   CL 102 06/12/2015 1408   CO2 23 06/12/2015 1408   CO2 21* 10/09/2014 1123   BUN 39* 06/12/2015 1408   BUN 31.2* 10/09/2014 1123   CREATININE 1.20 06/12/2015 1408   CREATININE 1.4* 10/09/2014 1123      Component Value Date/Time   CALCIUM 9.2 06/12/2015 1408   CALCIUM 9.3 10/09/2014 1123   ALKPHOS 73 10/14/2014 1456   ALKPHOS 70 10/09/2014 1123   AST 18 10/14/2014 1456  AST 17 10/09/2014 1123   ALT 16 10/14/2014 1456   ALT 15 10/09/2014 1123   BILITOT <0.2* 10/14/2014 1456   BILITOT 0.22 10/09/2014 1123     Other lab results: Ferritin 114, serum iron 51, total iron binding capacity 275 and iron saturation 16%.  RADIOGRAPHIC STUDIES: Ct Lumbar Spine W Contrast  07/07/2015   CLINICAL DATA:  Low back pain.  No previous lumbar surgery.  EXAM: LUMBAR MYELOGRAM  CT LUMBAR SPINE WITH INTRATHECAL CONTRAST  FLUOROSCOPY TIME:  54 seconds, 65  uGym2 DAP  TECHNIQUE: The procedure, risks (including but not limited to bleeding, infection, organ damage ), benefits, and alternatives were explained to the patient. Questions regarding the procedure were encouraged and answered. The patient understands and consents to the procedure. An appropriate entry site was determined under fluoroscopy. Operator donned sterile gloves and mask. Skin site was marked, prepped with Betadine, and draped in usual sterile fashion, and infiltrated locally with 1% lidocaine. A 22 gauge spinal needle was advanced into the thecal sac at L3-4 from a right parasagittal approach. Clear colorless CSF returned. 17 ml Omnipaque 180 were administered intrathecally for lumbar myelography, followed by axial CT scanning of the lumbar spine. I  personally performed the lumbar puncture and administered the intrathecal contrast. I also personally supervised acquisition of the myelogram images. Coronal and sagittal reconstructions were generated from the axial scan.  COMPARISON:  None  FINDINGS: There are 5 non rib-bearing lumbar segments, labeled L1-L5. There is a lumbar dextroscoliosis apex L2-3  T12-L1: Narrowing of the interspace with early vacuum phenomenon. Mild circumferential disc bulge. Bilateral facet degenerative spurring. No significant spinal or foraminal stenosis. Conus terminates at the level of the interspace.  L1-2: Moderate narrowing of the interspace with vacuum phenomena, left worse than right. Multiple small Schmorl's nodes in the endplates. Circumferential disc bulge with associated endplate spurring. Bilateral facet degenerative hypertrophy with some thickening of ligamentum flavum resulting in narrowing of the AP diameter of the spinal canal and bilateral foraminal stenosis left worse than right.  L2-3: Narrowing of the interspace with extensive vacuum phenomenon. Multiple small Schmorl's nodes in the endplates. Circumferential disc bulge with moderate broad posterior protrusion and associated endplate spurring. Bilateral facet degenerative hypertrophy and spurring with some thickening of ligamentum flavum resulting in moderately severe narrowing of the spinal canal in the AP diameter. There is bilateral foraminal stenosis left worse than right.  L3-4: Advanced narrowing of the interspace. Small Schmorl's nodes in the adjacent endplates. Posterior osteophytic ridging from the endplates without, resulting in mild subarticular recess narrowing. Asymmetric facet DJD left worse than right with bilateral foraminal stenosis. Bridging osteophytes laterally on the left.  L4-5: Moderate narrowing of the interspace with vacuum phenomenon. Circumferential disc bulge with osteophytic ridging from the endplates. Moderately advanced facet DJD and  spurring resulting in subarticular recess narrowing bilaterally, and contributing to bilateral foraminal stenosis right worse than left.  L5-S1: Asymmetric narrowing of the interspace with moderate vacuum phenomenon, right worse than left. Mild disc bulge. Moderate bilateral facet DJD and spurring with some thickening of the ligamentum flavum. Central canal remains patent. There is bilateral foraminal encroachment.  Extensive aortoiliac calcified arterial plaque. Vacuum phenomenon in bilateral sacroiliac joints with subchondral cysts or geodes bilaterally. Remainder visualized paraspinal soft tissues unremarkable.  IMPRESSION: 1. Lumbar dextroscoliosis with advanced degenerative disc disease and facet DJD at all lumbar levels. 2. Mild spinal stenosis L1-2 with bilateral foraminal stenosis. 3. Moderately severe narrowing of the spinal canal L2-3 with bilateral foraminal stenosis  4. Bilateral subarticular recess and foraminal stenosis L3-4 and L4-5. 5. Facet DJD and disc bulge result in bilateral foraminal encroachment L5-S1.   Electronically Signed   By: Lucrezia Europe M.D.   On: 07/07/2015 14:31   Dg Myelography Lumbar Inj Lumbosacral  07/07/2015   CLINICAL DATA:  Low back pain.  No previous lumbar surgery.  EXAM: LUMBAR MYELOGRAM  CT LUMBAR SPINE WITH INTRATHECAL CONTRAST  FLUOROSCOPY TIME:  54 seconds, 45  uGym2 DAP  TECHNIQUE: The procedure, risks (including but not limited to bleeding, infection, organ damage ), benefits, and alternatives were explained to the patient. Questions regarding the procedure were encouraged and answered. The patient understands and consents to the procedure. An appropriate entry site was determined under fluoroscopy. Operator donned sterile gloves and mask. Skin site was marked, prepped with Betadine, and draped in usual sterile fashion, and infiltrated locally with 1% lidocaine. A 22 gauge spinal needle was advanced into the thecal sac at L3-4 from a right parasagittal approach.  Clear colorless CSF returned. 17 ml Omnipaque 180 were administered intrathecally for lumbar myelography, followed by axial CT scanning of the lumbar spine. I personally performed the lumbar puncture and administered the intrathecal contrast. I also personally supervised acquisition of the myelogram images. Coronal and sagittal reconstructions were generated from the axial scan.  COMPARISON:  None  FINDINGS: There are 5 non rib-bearing lumbar segments, labeled L1-L5. There is a lumbar dextroscoliosis apex L2-3  T12-L1: Narrowing of the interspace with early vacuum phenomenon. Mild circumferential disc bulge. Bilateral facet degenerative spurring. No significant spinal or foraminal stenosis. Conus terminates at the level of the interspace.  L1-2: Moderate narrowing of the interspace with vacuum phenomena, left worse than right. Multiple small Schmorl's nodes in the endplates. Circumferential disc bulge with associated endplate spurring. Bilateral facet degenerative hypertrophy with some thickening of ligamentum flavum resulting in narrowing of the AP diameter of the spinal canal and bilateral foraminal stenosis left worse than right.  L2-3: Narrowing of the interspace with extensive vacuum phenomenon. Multiple small Schmorl's nodes in the endplates. Circumferential disc bulge with moderate broad posterior protrusion and associated endplate spurring. Bilateral facet degenerative hypertrophy and spurring with some thickening of ligamentum flavum resulting in moderately severe narrowing of the spinal canal in the AP diameter. There is bilateral foraminal stenosis left worse than right.  L3-4: Advanced narrowing of the interspace. Small Schmorl's nodes in the adjacent endplates. Posterior osteophytic ridging from the endplates without, resulting in mild subarticular recess narrowing. Asymmetric facet DJD left worse than right with bilateral foraminal stenosis. Bridging osteophytes laterally on the left.  L4-5: Moderate  narrowing of the interspace with vacuum phenomenon. Circumferential disc bulge with osteophytic ridging from the endplates. Moderately advanced facet DJD and spurring resulting in subarticular recess narrowing bilaterally, and contributing to bilateral foraminal stenosis right worse than left.  L5-S1: Asymmetric narrowing of the interspace with moderate vacuum phenomenon, right worse than left. Mild disc bulge. Moderate bilateral facet DJD and spurring with some thickening of the ligamentum flavum. Central canal remains patent. There is bilateral foraminal encroachment.  Extensive aortoiliac calcified arterial plaque. Vacuum phenomenon in bilateral sacroiliac joints with subchondral cysts or geodes bilaterally. Remainder visualized paraspinal soft tissues unremarkable.  IMPRESSION: 1. Lumbar dextroscoliosis with advanced degenerative disc disease and facet DJD at all lumbar levels. 2. Mild spinal stenosis L1-2 with bilateral foraminal stenosis. 3. Moderately severe narrowing of the spinal canal L2-3 with bilateral foraminal stenosis 4. Bilateral subarticular recess and foraminal stenosis L3-4 and L4-5. 5. Facet  DJD and disc bulge result in bilateral foraminal encroachment L5-S1.   Electronically Signed   By: Lucrezia Europe M.D.   On: 07/07/2015 14:31    ASSESSMENT AND PLAN: This is a very pleasant 81 years old white male with iron deficiency anemia status post Feraheme infusion and currently on Integra plus 1 capsule by mouth daily and tolerating it fairly well. The patient is feeling better. Has mild decrease in his hemoglobin and hematocrit but his iron study and ferritin are stable. I discussed the lab result with the patient today and recommended for him to continue on Integra plus for now. I will see him back for follow-up visit in 3 months with repeat CBC, iron study and ferritin. He was advised to call immediately if he has any concerning symptoms in the interval. The patient voices understanding of  current disease status and treatment options and is in agreement with the current care plan.  All questions were answered. The patient knows to call the clinic with any problems, questions or concerns. We can certainly see the patient much sooner if necessary.  Disclaimer: This note was dictated with voice recognition software. Similar sounding words can inadvertently be transcribed and may not be corrected upon review.

## 2015-08-06 NOTE — Telephone Encounter (Signed)
Pt confirmed labs/ov per 08/17 POF, gave pt avs and calendar... Kj

## 2015-08-22 ENCOUNTER — Ambulatory Visit (INDEPENDENT_AMBULATORY_CARE_PROVIDER_SITE_OTHER): Payer: Medicare Other | Admitting: Family Medicine

## 2015-08-22 ENCOUNTER — Encounter: Payer: Self-pay | Admitting: Family Medicine

## 2015-08-22 VITALS — BP 102/60 | HR 80 | Temp 99.1°F | Ht 66.0 in | Wt 182.0 lb

## 2015-08-22 DIAGNOSIS — R251 Tremor, unspecified: Secondary | ICD-10-CM | POA: Diagnosis not present

## 2015-08-22 DIAGNOSIS — Z23 Encounter for immunization: Secondary | ICD-10-CM

## 2015-08-22 DIAGNOSIS — I6523 Occlusion and stenosis of bilateral carotid arteries: Secondary | ICD-10-CM | POA: Diagnosis not present

## 2015-08-22 MED ORDER — HYDROCODONE-ACETAMINOPHEN 10-325 MG PO TABS: 1.0000 | ORAL_TABLET | Freq: Four times a day (QID) | ORAL | Status: DC | PRN

## 2015-08-22 NOTE — Progress Notes (Signed)
Pre visit review using our clinic review tool, if applicable. No additional management support is needed unless otherwise documented below in the visit note. 

## 2015-08-22 NOTE — Progress Notes (Signed)
   Subjective:    Patient ID: CHRYSTOPHER STANGL, male    DOB: 1925/12/13, 81 y.o.   MRN: [id number]  HPI Here to discuss worsening tremors. He has had resting tremors in both hands for several years, but this is getting worse. He as trouble grasping things, feeding himself, writing, etc. He has also developed tremors of the head and the jaw. He is more unsteady on his feet lately and cannot walk without using his rolling walker. He fell in his yard last week while trying to use a watering hose. Fortunately he landed in some pine straw and bushes that cushioned his fall. He had a mild bruise on the left hip only. He tried Mirapex but had to stop this due to sleepiness.    Review of Systems  Respiratory: Negative.   Cardiovascular: Negative.   Neurological: Positive for tremors and weakness. Negative for dizziness, seizures, syncope, facial asymmetry, speech difficulty, light-headedness, numbness and headaches.       Objective:   Physical Exam  Constitutional: He is oriented to person, place, and time. He appears well-developed and well-nourished.  Cardiovascular: Normal rate, regular rhythm, normal heart sounds and intact distal pulses.   Pulmonary/Chest: Effort normal and breath sounds normal.  Neurological: He is alert and oriented to person, place, and time.  Unsteady on his feet. He has pronounced resting tremors of both hands. His facial expression is flat and he has very little mouth movement as he speaks.           Assessment & Plan:  I believe he has developed a Parkinsonian syndrome. We will refer him to Neurology.

## 2015-09-06 ENCOUNTER — Other Ambulatory Visit: Payer: Self-pay | Admitting: Family Medicine

## 2015-09-15 ENCOUNTER — Encounter: Payer: Self-pay | Admitting: Neurology

## 2015-09-15 ENCOUNTER — Ambulatory Visit (INDEPENDENT_AMBULATORY_CARE_PROVIDER_SITE_OTHER): Payer: Medicare Other | Admitting: Neurology

## 2015-09-15 VITALS — BP 136/84 | HR 64 | Temp 97.8°F | Resp 16 | Ht 66.0 in | Wt 181.5 lb

## 2015-09-15 DIAGNOSIS — I6523 Occlusion and stenosis of bilateral carotid arteries: Secondary | ICD-10-CM | POA: Diagnosis not present

## 2015-09-15 DIAGNOSIS — G629 Polyneuropathy, unspecified: Secondary | ICD-10-CM | POA: Diagnosis not present

## 2015-09-15 DIAGNOSIS — E1142 Type 2 diabetes mellitus with diabetic polyneuropathy: Secondary | ICD-10-CM

## 2015-09-15 DIAGNOSIS — E1342 Other specified diabetes mellitus with diabetic polyneuropathy: Secondary | ICD-10-CM

## 2015-09-15 DIAGNOSIS — G20 Parkinson's disease: Secondary | ICD-10-CM

## 2015-09-15 MED ORDER — CARBIDOPA-LEVODOPA 25-100 MG PO TABS: 1.0000 | ORAL_TABLET | Freq: Three times a day (TID) | ORAL | Status: DC

## 2015-09-15 NOTE — Patient Instructions (Addendum)
1.  Start carbidopa/levodopa 25/100 1/2 tab three times a day before meals x 1 wk, then 1/2 in am & noon & 1 in evening for a week, then 1/2 in am &1 at noon &one in evening for a week, then 1 tablet three times a day before meals (9am/1pm/5-6pm) 2.  We will send a referral to the neurorehab center for the parkinsons program for Physical and occupational therapy

## 2015-09-15 NOTE — Progress Notes (Signed)
Stanley [name] was seen today in the movement disorders clinic for neurologic consultation at the request of FRY,STEPHEN A, MD.  The consultation is for the evaluation of tremor and to rule out Parkinson's disease.  I reviewed his primary care records that are available to me.  The patient noted a "intention" tremor that started in the L hand for years ago but over the last year he has noted tremor in the right hand at rest over the last year at rest.  He is right hand hand dominant. He denies head or jaw tremor but records state that he has this.  The patient was started on Mirapex in February, 2016 and was only able to get up to 0.25 at night and only took it for 2 nights and "it knocked me out."  Specific Symptoms:  Tremor: Yes.     Affected by caffeine:  Unknown (doesn't drink much caffeine)  Affected by alcohol:  No.  Affected by stress:  Yes.    Affected by fatigue:  No.  Spills soup if on spoon:  No.  Affects ADL's (tying shoes, brushing teeth, etc):  No.   Voice: "it varies" but "it has changed" Sleep: sleeps well  Vivid Dreams:  No.  Acting out dreams:  No.  Wet Pillows: rarely Postural symptoms:  Yes.  , and used walker x 1.5 years  Falls?  Yes.   (2 total but both in last month - one slipped on tile; the other he had a hose in hand and hit him in face and he fell backward) Bradykinesia symptoms: shuffling gait, slow movements, difficulty getting out of a chair and difficulty regaining balance Loss of smell:  No. Loss of taste:  No. Urinary Incontinence:  Yes.  , but on myrbetriq and it helps Difficulty Swallowing:  No. Handwriting, micrographia: No. but states that the writing is "irregular" Trouble buttoning clothing: only with cuff buttons Depression:  "some" Memory changes:  Only with names Hallucinations:  No.  visual distortions: No. N/V:  No. Lightheaded:  No.  Syncope: No. Diplopia:  No. Dyskinesia:  No.  Neuroimaging has not previously been performed.     PREVIOUS MEDICATIONS: Mirapex, 0.25 mg daily at bedtime - only took for 2 days   ALLERGIES:   Allergies  Allergen Reactions  . Morphine And Related Nausea And Vomiting  . Succinylcholine Other (See Comments)    unknown  . Ace Inhibitors Other (See Comments)    cough    CURRENT MEDICATIONS:  Outpatient Encounter Prescriptions as of 09/15/2015  Medication Sig  . ALPRAZolam (XANAX) 0.25 MG tablet take 1 tablet by mouth every 6 hours if needed  . aspirin 81 MG tablet Take 81 mg by mouth daily.  . Calcium Carbonate-Vitamin D (CALCIUM-VITAMIN D) 500-200 MG-UNIT per tablet Take 1 tablet by mouth 2 (two) times daily.   . diclofenac (VOLTAREN) 75 MG EC tablet TAKE 1 TABLET TWICE A DAY  . FeFum-FePoly-FA-B Cmp-C-Biot (INTEGRA PLUS) CAPS TAKE 1 CAPSULE EVERY MORNING  . finasteride (PROSCAR) 5 MG tablet Take 5 mg by mouth daily.  Marland Kitchen glucose blood (ONE TOUCH TEST STRIPS) test strip Dispense one touch ultra, test TID and diagnosis code is E11.9  . HYDROcodone-acetaminophen (NORCO) 10-325 MG per tablet Take 1 tablet by mouth every 6 (six) hours as needed for severe pain.  . Lancets (ONETOUCH ULTRASOFT) lancets TEST 3 TIMES DAILY AS DIRECTED.  Marland Kitchen mirabegron ER (MYRBETRIQ) 50 MG TB24 tablet Take 50 mg by mouth daily.  Marland Kitchen  Multiple Vitamin (MULITIVITAMIN WITH MINERALS) TABS Take 1 tablet by mouth daily.  . pantoprazole (PROTONIX) 40 MG tablet Take 1 tablet (40 mg total) by mouth 2 (two) times daily.  . pioglitazone (ACTOS) 45 MG tablet Take 1 tablet (45 mg total) by mouth daily.  . rosuvastatin (CRESTOR) 20 MG tablet Take 10 mg by mouth daily.   . sitaGLIPtin-metformin (JANUMET) 50-1000 MG per tablet take 1 tablet by mouth twice a day WITH A MEAL.  . tamsulosin (FLOMAX) 0.4 MG CAPS capsule Take 0.4 mg by mouth daily after breakfast.   . carbidopa-levodopa (SINEMET) 25-100 MG per tablet Take 1 tablet by mouth 3 (three) times daily.  . chlorhexidine (PERIDEX) 0.12 % solution 2 mLs by Mouth Rinse route 2  (two) times daily.  . diphenoxylate-atropine (LOMOTIL) 2.5-0.025 MG per tablet TAKE 1 TABLET 4 TIMES DAILY AS NEEDED FOR DIARRHEA OR LOOSE STOOLS. (Patient not taking: Reported on 09/15/2015)  . predniSONE (STERAPRED UNI-PAK) 5 MG TABS tablet as needed.  . prochlorperazine (COMPAZINE) 10 MG tablet take 1 tablet by mouth four times a day as needed for nausea   No facility-administered encounter medications on file as of 09/15/2015.    PAST MEDICAL HISTORY:   Past Medical History  Diagnosis Date  . Diabetes mellitus   . BPH (benign prostatic hyperplasia)   . Arthritis   . GERD (gastroesophageal reflux disease)   . Hypertension     pt denies  . Shortness of breath     uses cpap  . 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 Failed total knee, right   . H pylori ulcer     Gastric ulcer  . CAD (coronary artery disease)  s/p CABG   . Aortic stenosis     S/p pericardial valve  . Anemia   . Mobitz type 1 second degree atrioventricular block   . Shingles 05-12-12    PAST SURGICAL HISTORY:   Past Surgical History  Procedure Laterality Date  . Aortic valve replacement    . Foot arthrotomy Right   . Tonsillectomy    . Cataract extraction, bilateral    . Humerus fracture surgery    . Replacement total knee Bilateral   . Toe amputation      rt 2nd  . Cardiac catheterization    . Amputation  01/19/2012    Procedure: AMPUTATION DIGIT;  Surgeon: Colin Rhein, MD;  Location: [facility];  Service: Orthopedics;  Laterality: Right;  right great toe amputation through MTP joint  . Rotator cuff repair Right   . Enteroscopy N/A 10/15/2014    Procedure: ENTEROSCOPY;  Surgeon: Gatha Mayer, MD;  Location: [facility];  Service: Endoscopy;  Laterality: N/A;  . Colonoscopy with propofol N/A 10/17/2014    Procedure: COLONOSCOPY WITH PROPOFOL;  Surgeon: Gatha Mayer, MD;  Location: [facility];  Service:  Endoscopy;  Laterality: N/A;  . Abdominal aortagram N/A 02/25/2012    Procedure: ABDOMINAL AORTAGRAM;  Surgeon: Elam Dutch, MD;  Location: [facility] CATH LAB;  Service: Cardiovascular;  Laterality: N/A;    SOCIAL HISTORY:   Social History   Social History  . Marital Status: Married    Spouse Name: N/A  . Number of Children: N/A  . Years of Education: N/A   Occupational History  . retired     Engineer, drilling (primary care physician)   Social History Main Topics  . Smoking status: Never Smoker   . Smokeless  tobacco: Never Used  . Alcohol Use: 4.2 oz/week    7 Glasses of wine per week     Comment: glass of wine or vodka/tonic per night  . Drug Use: No  . Sexual Activity: Not on file   Other Topics Concern  . Not on file   Social History Narrative   Married, retired primary care MD    + children          FAMILY HISTORY:   Family Status  Relation Status Death Age  . Mother Deceased     carotid artery aneurysm  . Father Deceased 10    unknown cause of death  . Brother Deceased     as child  . Sister Deceased 24    amyloid  . Child Alive     2 sons, healthy    ROS:  A complete 10 system review of systems was obtained and was unremarkable apart from what is mentioned above.  PHYSICAL EXAMINATION:    VITALS:   Filed Vitals:   09/15/15 0916  BP: 136/84  Pulse: 64  Temp: 97.8 F (36.6 C)  TempSrc: Oral  Resp: 16  Height: 5\' 6"  (1.676 m)  Weight: 181 lb 8 oz (82.328 kg)    GEN:  The patient appears stated age and is in NAD. HEENT:  Normocephalic, atraumatic.  The mucous membranes are moist. The superficial temporal arteries are without ropiness or tenderness. CV:  RRR Lungs:  CTAB Neck/HEME:  There are no carotid bruits bilaterally.  Neurological examination:  Orientation: The patient is alert and oriented x3. Fund of knowledge is appropriate.  Recent and remote memory are intact.  Attention and concentration are normal.    Able to name objects and repeat  phrases. Cranial nerves: There is good facial symmetry.  There is mild facial hypomimia.   Pupils are pinpoint and minimally reactive to light bilaterally. Fundoscopic exam is attempted but the disc margins are not well visualized bilaterally. Extraocular muscles are intact. The visual fields are full to confrontational testing. The speech is fluent and clear. Soft palate rises symmetrically and there is no tongue deviation. Hearing is intact to conversational tone. Sensation: Sensation is intact to light and pinprick throughout (facial, trunk, extremities).  Pinprick and light touch are both decreased distally. Vibration is markedly decreased distally. There is no extinction with double simultaneous stimulation. There is no sensory dermatomal level identified. Motor: Strength is 5/5 in the bilateral upper and lower extremities.   Shoulder shrug is equal and symmetric.  There is no pronator drift. Deep tendon reflexes: Deep tendon reflexes are 0/4 at the bilateral biceps, triceps, brachioradialis, patella and achilles. Plantar responses are downgoing bilaterally.  Movement examination: Tone: There is increased tone in the bilateral upper extremities, R greater than L.  Abnormal movements: There is a bilateral UE resting tremor, L more than R but both increase with distraction procedures Coordination:  There is decremation with RAM's, with virtually all forms of RAM's, , including alternating supination and pronation of the forearm, hand opening and closing, finger taps, heel taps and toe taps. Gait and Station: The patient has difficulty arising out of a deep-seated chair without the use of the hands. The patient's stride length is decreased; he cannot (refuses) walk without the walker; posture is stooped.     ASSESSMENT/PLAN:  1.  Parkinsonism.  I suspect that this does represent idiopathic Parkinson's disease.  The patient has tremor, bradykinesia, rigidity and postural instability.  -We discussed  the diagnosis as  well as pathophysiology of the disease.  We discussed treatment options as well as prognostic indicators.  Patient education was provided.  -Greater than 50% of the 60 minute visit was spent in counseling answering questions and talking about what to expect now as well as in the future.  We talked about medication options as well as potential future surgical options.  We talked about safety in the home.  -We decided to add carbidopa/levodopa 25/100.  1/2 tab tid x 1 wk, then 1/2 in am & noon & 1 at night for a week, then 1/2 in am &1 at noon &night for a week, then 1 po tid.  Risks, benefits, side effects and alternative therapies were discussed.  The opportunity to ask questions was given and they were answered to the best of my ability.  The patient expressed understanding and willingness to follow the outlined treatment protocols.  -I will refer the patient to the Parkinson's program at the [facility], for PT/OT.  We talked about the importance of safe, cardiovascular exercise in Parkinson's disease.  -We discussed community resources in the area including patient support groups and community exercise programs for PD and pt education was provided to the patient. 2.  Diabetic peripheral neuropathy  -This will contribute to gait instability.  Safety discussed 3.  Follow up is anticipated in the next few months, sooner should new neurologic issues arise.

## 2015-09-26 ENCOUNTER — Telehealth: Payer: Self-pay | Admitting: Neurology

## 2015-09-26 NOTE — Telephone Encounter (Signed)
Pt called to inform that the [facility] hasn't called him back yet//call @ [phone]

## 2015-09-26 NOTE — Telephone Encounter (Signed)
Called patient back and gave him the number to rehab so that he can set up his appt.

## 2015-10-06 LAB — HM DIABETES EYE EXAM

## 2015-10-15 ENCOUNTER — Ambulatory Visit: Payer: Medicare Other | Attending: Neurology | Admitting: Physical Therapy

## 2015-10-15 DIAGNOSIS — R269 Unspecified abnormalities of gait and mobility: Secondary | ICD-10-CM | POA: Diagnosis not present

## 2015-10-15 DIAGNOSIS — M6281 Muscle weakness (generalized): Secondary | ICD-10-CM | POA: Insufficient documentation

## 2015-10-15 DIAGNOSIS — R293 Abnormal posture: Secondary | ICD-10-CM | POA: Diagnosis present

## 2015-10-16 NOTE — Therapy (Signed)
[facility] [address] [place], [place], [address] Phone: [phone]   Fax:  [phone]  Physical Therapy Evaluation  Patient Details  Name: Stanley [name] MRN: [id number] Date of Birth: 1925-06-19 Referring Provider: Alonza Bogus, DO  Encounter Date: 10/15/2015      PT End of Session - 10/16/15 1312    Visit Number 1  New episode of care starting 10/15/15   Number of Visits 9   Date for PT Re-Evaluation 12/14/15   Authorization Type Medicare primary/BCBS secondary-G-code every 10th visit   PT Start Time 1535   PT Stop Time 1619   PT Time Calculation (min) 44 min   Activity Tolerance Patient tolerated treatment well   Behavior During Therapy [facility] for tasks assessed/performed      Past Medical History  Diagnosis Date  . Diabetes mellitus   . BPH (benign prostatic hyperplasia)   . Arthritis   . GERD (gastroesophageal reflux disease)   . Hypertension     pt denies  . Shortness of breath     uses cpap  . 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 Failed total knee, right   . H pylori ulcer     Gastric ulcer  . CAD (coronary artery disease)  s/p CABG   . Aortic stenosis     S/p pericardial valve  . Anemia   . Mobitz type 1 second degree atrioventricular block   . Shingles 05-12-12    Past Surgical History  Procedure Laterality Date  . Aortic valve replacement    . Foot arthrotomy Right   . Tonsillectomy    . Cataract extraction, bilateral    . Humerus fracture surgery    . Replacement total knee Bilateral   . Toe amputation      rt 2nd  . Cardiac catheterization    . Amputation  01/19/2012    Procedure: AMPUTATION DIGIT;  Surgeon: Colin Rhein, MD;  Location: [facility];  Service: Orthopedics;  Laterality: Right;  right great toe amputation through MTP joint  . Rotator cuff repair Right   . Enteroscopy  N/A 10/15/2014    Procedure: ENTEROSCOPY;  Surgeon: Gatha Mayer, MD;  Location: [facility];  Service: Endoscopy;  Laterality: N/A;  . Colonoscopy with propofol N/A 10/17/2014    Procedure: COLONOSCOPY WITH PROPOFOL;  Surgeon: Gatha Mayer, MD;  Location: [facility];  Service: Endoscopy;  Laterality: N/A;  . Abdominal aortagram N/A 02/25/2012    Procedure: ABDOMINAL AORTAGRAM;  Surgeon: Elam Dutch, MD;  Location: [facility] CATH LAB;  Service: Cardiovascular;  Laterality: N/A;    There were no vitals filed for this visit.  Visit Diagnosis:  Abnormality of gait  Muscle weakness of lower extremity  Abnormal posture      Subjective Assessment - 10/15/15 1535    Subjective Pt has seen Dr. Carles Collet 09/18/15; has had tremor in L arm over a year., which has increased over the past 3-4 months.  He reports the Sinemet has helped.  He reports difficulty with his spinal stenosis, with 2 sets of injections in the past 6 years.  He is now using 4-wheeled RW for mobility.  He has had one fall in the past  18months, when going to the bathroom, foot slipping on tile getting up from the toilet.   Pertinent History Parkinson's disease diagnosed 08/2015; spinal stenosis; history of bilateral knee replacement-   Patient Stated Goals  Pt's goal for therapy is for walking and stability to improve.   Currently in Pain? Yes   Pain Score 8   at times 10/10   Pain Location Back   Pain Orientation Mid   Pain Descriptors / Indicators Aching   Pain Type Chronic pain   Pain Onset More than a month ago   Pain Frequency Intermittent   Aggravating Factors  walking longer distances   Pain Relieving Factors Sitting down; pain medication            [facility] PT Assessment - 10/15/15 1545    Assessment   Medical Diagnosis Parkinsonism   Referring Provider Wells Guiles Tat, DO   Onset Date/Surgical Date 09/18/15  diagnosis   Precautions   Precautions Fall  spinal stenosis-don't overdue back exercises   Precaution  Comments Pt has stopped driving in the past several weeks   Balance Screen   Has the patient fallen in the past 6 months Yes   How many times? 1   Has the patient had a decrease in activity level because of a fear of falling?  Yes   Is the patient reluctant to leave their home because of a fear of falling?  No   Home Environment   Living Environment Private residence   Living Arrangements Spouse/significant other   Available Help at Discharge Family   Type of [place] Access Level entry   [place] One level;Laundry or work area in [employer] fast streamer - 4 wheels;Cane - single point   Prior Function   Level of Independence Independent with basic ADLs;Independent with household mobility with device   Vocation Retired  Physician   Leisure Was working out 2x/wk [employer] with trainer (hasn't gone in 3 weeks)   ROM / Strength   AROM / PROM / Strength Strength   Strength   Overall Strength Comments hip flexion 3+/5 bilat, R knee ext 3-/5, L knee ext 4/5, ankle dorsiflexion 3+/5 bilat   Transfers   Transfers Sit to Stand;Stand to Sit  Difficulty with transfers, low surfaces   Sit to Stand 6: Modified independent (Device/Increase time);With upper extremity assist;From chair/3-in-1;With armrests   Stand to Sit 6: Modified independent (Device/Increase time);With upper extremity assist;With armrests;To chair/3-in-1   Comments Requires use of UE support   Ambulation/Gait   Ambulation/Gait Yes   Ambulation/Gait Assistance 5: Supervision   Ambulation Distance (Feet) 120 Feet   Assistive device 4-wheeled walker   Gait Pattern Step-through pattern;Trunk flexed;Narrow base of support   Ambulation Surface Level;Indoor   Gait velocity 17.58 sec= 1.87 ft/sec   Standardized Balance Assessment   Standardized Balance Assessment Timed Up and Go Test;Berg Balance Test   Berg Balance Test   Sit to Stand Able to stand  independently using hands   Standing Unsupported Able  to stand safely 2 minutes   Sitting with Back Unsupported but Feet Supported on Floor or Stool Able to sit safely and securely 2 minutes   Stand to Sit Controls descent by using hands   Transfers Able to transfer safely, definite need of hands   Standing Unsupported with Eyes Closed Unable to keep eyes closed 3 seconds but stays steady   Standing Ubsupported with Feet Together Needs help to attain position but able to stand for 30 seconds with feet together   From Standing, Reach Forward with Outstretched Arm Can reach forward >5 cm safely (2")   From Standing Position, Pick up Object from Floor Unable to  try/needs assist to keep balance   From Standing Position, Turn to Look Behind Over each Shoulder Needs supervision when turning   Turn 360 Degrees Needs assistance while turning   Standing Unsupported, Alternately Place Feet on Step/Stool Needs assistance to keep from falling or unable to try   Standing Unsupported, One Foot in [place] to take small step independently and hold 30 seconds   Standing on One Leg Unable to try or needs assist to prevent fall   Total Score 24   Berg comment: Scores < 45/56 are indicative of increased fall risk.   Timed Up and Go Test   Normal TUG (seconds) 24.41   Cognitive TUG (seconds) 29.61                                PT Long Term Goals - 10/16/15 1411    PT LONG TERM GOAL #1   Title Pt will be independent with Parkinson's-specific HEP for improved balance, functional strength and posture.   Time 4   Period Weeks   Status New   PT LONG TERM GOAL #2   Title Pt will perform TUG and TUG cognitive with less than 10% difference to demonstrate improved ability for dual tasking with gait.   Time 4   Period Days   Status New   PT LONG TERM GOAL #3   Title Pt will improve Berg Balance score to at least 32/56 for decreased fall risk.   Time 4   Period Weeks   Status New   PT LONG TERM GOAL #4   Title Pt will improve gait  velocity to at least 2 ft/sec for improved gait efficiency and decreased fall risk.   Time 4   Period Weeks   Status New   PT LONG TERM GOAL #5   Title Pt will verbalize understanding of local Parkinson's disease-related resources.   Time 4   Period Weeks   Status New   Additional Long Term Goals   Additional Long Term Goals Yes   PT LONG TERM GOAL #6   Title Pt will verbalize plans for continued community fitness upon D/C from PT.   Time 4   Period Weeks   Status New               Plan - 10/16/15 1405    Clinical Impression Statement Pt is an 81 year old male who presents to OP PT with diagnosis of Parkinson's at September visit to movement disorders specialist.  He presents with UE tremor bilaterally with decreased lower extremity strength and decreased balance.  Pt's gait is limited due to pain from spinal stenosis; however, pt is at fall risk per Merrilee Jansky Balance score of 24/56 and TUG score of 24.41 sec.  Pt also noted to have decreased ability for dual tasking with gait, with TUG cognitive score of 29.61 seconds.Pt would benefit from skilled PT to address balance, strength, posture, transfer training, and short distance gait for improved functional mobility and decreased falls.   Pt will benefit from skilled therapeutic intervention in order to improve on the following deficits Abnormal gait;Decreased balance;Decreased mobility;Decreased strength;Difficulty walking;Postural dysfunction   Rehab Potential Good   PT Frequency 2x / week   PT Duration 4 weeks  plus eval   PT Treatment/Interventions ADLs/Self Care Home Management;Therapeutic exercise;Therapeutic activities;Functional mobility training;Gait training;Balance training;Neuromuscular re-education;Patient/family education   PT Next Visit Plan Postural re-education through PWR! Moves sitting and  standing Basic 4 (be mindful of spinal stenosis); transfer training for functional lower extremity strengthening; balance  activities/weightshifting   Recommended Other Services Follow up with pt regarding whether he wants to pursue OT   Consulted and Agree with Plan of Care Patient          G-Codes - November 14, 2015 1416    Functional Assessment Tool Used TUG 24.41 seconds, TUG cognitive 29.61 sec, Berg 24/56, 1.87 ft/sec gait velocity   Functional Limitation Mobility: Walking and moving around   Mobility: Walking and Moving Around Current Status [phone]) At least 40 percent but less than 60 percent impaired, limited or restricted   Mobility: Walking and Moving Around Goal Status [phone]) At least 20 percent but less than 40 percent impaired, limited or restricted       Problem List Patient Active Problem List   Diagnosis Date Noted  . Spinal stenosis of lumbar region 07/15/2015  . Tremors of nervous system 01/29/2015  . Bradycardia 11/07/2014  . Colon polyp 10/17/2014  . Mobitz type 1 second degree atrioventricular block   . Heme + stool 10/15/2014  . Symptomatic anemia 2014-11-13  . Dyspnea on exertion 11-13-14  . Unsteady gait 10/02/2014  . Chronic systolic CHF (congestive heart failure) ([place]) 05/12/2014  . Orthostatic lightheadedness 03/19/2013  . First degree AV block 07/25/2012  . Atherosclerosis of native arteries of the extremities with ulceration ([place]) 02/17/2012  . HYPOTENSION, UNSPECIFIED 10/08/2010  . SECOND DEGREE AV HEART BLOCK, MOBITZ I 09/04/2010  . S/P AVR 09/01/2010  . CAD, NATIVE VESSEL 06/24/2010  . Diabetes mellitus without complication ([place]) 67/61/9509  . CAD (coronary artery disease) of artery bypass graft 02/16/2010  . OBSTRUCTIVE SLEEP APNEA 07/21/2009  . Hypothyroidism 12/07/2007  . HTN (hypertension) 12/07/2007  . Iron deficiency anemia 12/07/2007  . ELEVATED PROSTATE SPECIFIC ANTIGEN 12/07/2007    Frazier Butt. 10/16/2015, 2:18 PM  Frazier Butt., PT  [facility] [address] [place], [place],  [address] Phone: [phone]   Fax:  [phone]  Name: Stanley [name] MRN: [id number] Date of Birth: 25-Dec-1924

## 2015-10-21 ENCOUNTER — Ambulatory Visit: Payer: Medicare Other | Attending: Neurology | Admitting: Physical Therapy

## 2015-10-21 DIAGNOSIS — M6281 Muscle weakness (generalized): Secondary | ICD-10-CM | POA: Diagnosis present

## 2015-10-21 DIAGNOSIS — R293 Abnormal posture: Secondary | ICD-10-CM | POA: Insufficient documentation

## 2015-10-21 DIAGNOSIS — R2681 Unsteadiness on feet: Secondary | ICD-10-CM | POA: Insufficient documentation

## 2015-10-21 DIAGNOSIS — R29898 Other symptoms and signs involving the musculoskeletal system: Secondary | ICD-10-CM | POA: Diagnosis present

## 2015-10-21 DIAGNOSIS — R269 Unspecified abnormalities of gait and mobility: Secondary | ICD-10-CM | POA: Diagnosis present

## 2015-10-21 NOTE — Patient Instructions (Signed)
Provided patient with the following handouts:  -PWR! Moves in sitting-PWR! UP x 20, PWR! Rock x 20, PWR! Step x 20  -Axial mobility exercise program picture of trunk rotation in sitting x 20 reps  -PWR! Moves in standing-PWR! Up x 20, PWR! Rock x 20, PWR! Step x 20 with UE support at counter  -To be performed once daily

## 2015-10-22 NOTE — Therapy (Signed)
[facility] [address] [place], [place], [address] Phone: [phone]   Fax:  [phone]  Physical Therapy Treatment  Patient Details  Name: Stanley [name] MRN: [id number] Date of Birth: 04-02-1925 Referring Provider: Alonza Bogus, DO  Encounter Date: 10/21/2015      PT End of Session - 10/21/15 1312    Visit Number 2   Number of Visits 9   Date for PT Re-Evaluation 12/14/15   Authorization Type Medicare primary/BCBS secondary-G-code every 10th visit   PT Start Time 1106   PT Stop Time 1145   PT Time Calculation (min) 39 min   Activity Tolerance Patient tolerated treatment well   Behavior During Therapy [facility] for tasks assessed/performed      Past Medical History  Diagnosis Date  . Diabetes mellitus   . BPH (benign prostatic hyperplasia)   . Arthritis   . GERD (gastroesophageal reflux disease)   . Hypertension     pt denies  . Shortness of breath     uses cpap  . 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 Failed total knee, right   . H pylori ulcer     Gastric ulcer  . CAD (coronary artery disease)  s/p CABG   . Aortic stenosis     S/p pericardial valve  . Anemia   . Mobitz type 1 second degree atrioventricular block   . Shingles 05-12-12    Past Surgical History  Procedure Laterality Date  . Aortic valve replacement    . Foot arthrotomy Right   . Tonsillectomy    . Cataract extraction, bilateral    . Humerus fracture surgery    . Replacement total knee Bilateral   . Toe amputation      rt 2nd  . Cardiac catheterization    . Amputation  01/19/2012    Procedure: AMPUTATION DIGIT;  Surgeon: Colin Rhein, MD;  Location: [facility];  Service: Orthopedics;  Laterality: Right;  right great toe amputation through MTP joint  . Rotator cuff repair Right   . Enteroscopy N/A 10/15/2014    Procedure: ENTEROSCOPY;   Surgeon: Gatha Mayer, MD;  [facility];  Service: Endoscopy;  Laterality: N/A;  . Colonoscopy with propofol N/A 10/17/2014    Procedure: COLONOSCOPY WITH PROPOFOL;  Surgeon: Gatha Mayer, MD;  [facility];  Service: Endoscopy;  Laterality: N/A;  . Abdominal aortagram N/A 02/25/2012    Procedure: ABDOMINAL AORTAGRAM;  Surgeon: Elam Dutch, MD;  Location: [facility] CATH LAB;  Service: Cardiovascular;  Laterality: N/A;    There were no vitals filed for this visit.  Visit Diagnosis:  Abnormal posture  Abnormality of gait      Subjective Assessment - 10/21/15 1111    Subjective No changes, no falls since PT eval last week.  No pain in sitting; pain increases with longer distance walking.  Tremors seem to come and go.   Currently in Pain? No/denies  No pain at initiation of eval in sitting                            PWR [facility]) - 10/21/15 1118    PWR! exercises Moves in sitting;Moves in standing   PWR! Up x 10   modified to be more UE pushup from counter vs. squat   PWR! Rock x 8   PWR! Twist --  PWR Step x 8    Comments In standing:  Cues provided for technique, posture, proper performance of exercises   PWR! Up x 10    PWR! Rock x 10    PWR! Twist x 10  Modified using axial mobility program trunk rotation   PWR! Step x 10   Comments In sitting:  Cues provided for technique, posture, proper performance of exercises       PWR! Up performed for posture, PWR! Rock performed for [employer], [place]! Twist performed for trunk rotation, PWR! Step performed for step initiation.  Discussed how each of these exercises fits into daily activities.        PT Education - 10/21/15 1311    Education provided Yes   Education Details HEP-PWR! Moves sitting (axial mobility exercise program trunk rotation in sitting); PWR! MOves in standing (no twist in standing)  Extra time for instruction with picture of HEP   Person(s) Educated Patient   Methods  Explanation;Demonstration;Handout;Verbal cues   Comprehension Verbalized understanding;Returned demonstration;Need further instruction             PT Long Term Goals - 10/16/15 1411    PT LONG TERM GOAL #1   Title Pt will be independent with Parkinson's-specific HEP for improved balance, functional strength and posture.   Time 4   Period Weeks   Status New   PT LONG TERM GOAL #2   Title Pt will perform TUG and TUG cognitive with less than 10% difference to demonstrate improved ability for dual tasking with gait.   Time 4   Period Days   Status New   PT LONG TERM GOAL #3   Title Pt will improve Berg Balance score to at least 32/56 for decreased fall risk.   Time 4   Period Weeks   Status New   PT LONG TERM GOAL #4   Title Pt will improve gait velocity to at least 2 ft/sec for improved gait efficiency and decreased fall risk.   Time 4   Period Weeks   Status New   PT LONG TERM GOAL #5   Title Pt will verbalize understanding of local Parkinson's disease-related resources.   Time 4   Period Weeks   Status New   Additional Long Term Goals   Additional Long Term Goals Yes   PT LONG TERM GOAL #6   Title Pt will verbalize plans for continued community fitness upon D/C from PT.   Time 4   Period Weeks   Status New               Plan - 10/21/15 1313    Clinical Impression Statement HEP initiated today for PWR! Moves in sitting and standing, paying particular attention to pain associated with spinal stenosis.  Pt does not complain of pain during exercises today.  Modified the trunk rotation activities in sitting due to pt's history of shoulder issues.  Pt will continue to benefit from further skilled PT to address posture, balance, functional lower extremity strengthening.   Pt will benefit from skilled therapeutic intervention in order to improve on the following deficits Abnormal gait;Decreased balance;Decreased mobility;Decreased strength;Difficulty walking;Postural  dysfunction   Rehab Potential Good   PT Frequency 2x / week   PT Duration 4 weeks  plus eval   PT Treatment/Interventions ADLs/Self Care Home Management;Therapeutic exercise;Therapeutic activities;Functional mobility training;Gait training;Balance training;Neuromuscular re-education;Patient/family education   PT Next Visit Plan review PWR! Moves in sitting and standing (note modifications of positions); transfer training from higher surfaces  for functional lower extremity strengthening; balance activities/weigthshifting   Recommended Other Services Follow up with pt regarding whether he wants to pursue OT   Consulted and Agree with Plan of Care Patient        Problem List Patient Active Problem List   Diagnosis Date Noted  . Spinal stenosis of lumbar region 07/15/2015  . Tremors of nervous system 01/29/2015  . Bradycardia 11/07/2014  . Colon polyp 10/17/2014  . Mobitz type 1 second degree atrioventricular block   . Heme + stool 10/15/2014  . Symptomatic anemia 10/14/2014  . Dyspnea on exertion 10/14/2014  . Unsteady gait 10/02/2014  . Chronic systolic CHF (congestive heart failure) ([place]) 05/12/2014  . Orthostatic lightheadedness 03/19/2013  . First degree AV block 07/25/2012  . Atherosclerosis of native arteries of the extremities with ulceration ([place]) 02/17/2012  . HYPOTENSION, UNSPECIFIED 10/08/2010  . SECOND DEGREE AV HEART BLOCK, MOBITZ I 09/04/2010  . S/P AVR 09/01/2010  . CAD, NATIVE VESSEL 06/24/2010  . Diabetes mellitus without complication ([place]) 56/86/1683  . CAD (coronary artery disease) of artery bypass graft 02/16/2010  . OBSTRUCTIVE SLEEP APNEA 07/21/2009  . Hypothyroidism 12/07/2007  . HTN (hypertension) 12/07/2007  . Iron deficiency anemia 12/07/2007  . ELEVATED PROSTATE SPECIFIC ANTIGEN 12/07/2007    Dajanique Robley W. 10/22/2015, 10:04 AM  Frazier Butt., PT  [facility] [address] [place], [place], [address] Phone: [phone]   Fax:  [phone]  Name: Stanley [name] MRN: [id number] Date of Birth: 03-Sep-1925

## 2015-10-23 ENCOUNTER — Ambulatory Visit: Payer: Medicare Other | Admitting: Physical Therapy

## 2015-10-23 DIAGNOSIS — R293 Abnormal posture: Secondary | ICD-10-CM | POA: Diagnosis not present

## 2015-10-23 DIAGNOSIS — R269 Unspecified abnormalities of gait and mobility: Secondary | ICD-10-CM

## 2015-10-23 NOTE — Therapy (Signed)
[facility] [address] [place], [place], [address] Phone: [phone]   Fax:  [phone]  Physical Therapy Treatment  Patient Details  Name: Stanley [name] MRN: [id number] Date of Birth: 03-31-1925 Referring Provider: Alonza Bogus, DO  Encounter Date: 10/23/2015      PT End of Session - 10/23/15 1301    Visit Number 3   Number of Visits 9   Date for PT Re-Evaluation 12/14/15   Authorization Type Medicare primary/BCBS secondary-G-code every 10th visit   PT Start Time 1103   PT Stop Time 1149   PT Time Calculation (min) 46 min   Activity Tolerance Patient tolerated treatment well   Behavior During Therapy [facility] for tasks assessed/performed      Past Medical History  Diagnosis Date  . Diabetes mellitus   . BPH (benign prostatic hyperplasia)   . Arthritis   . GERD (gastroesophageal reflux disease)   . Hypertension     pt denies  . Shortness of breath     uses cpap  . 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 Failed total knee, right   . H pylori ulcer     Gastric ulcer  . CAD (coronary artery disease)  s/p CABG   . Aortic stenosis     S/p pericardial valve  . Anemia   . Mobitz type 1 second degree atrioventricular block   . Shingles 05-12-12    Past Surgical History  Procedure Laterality Date  . Aortic valve replacement    . Foot arthrotomy Right   . Tonsillectomy    . Cataract extraction, bilateral    . Humerus fracture surgery    . Replacement total knee Bilateral   . Toe amputation      rt 2nd  . Cardiac catheterization    . Amputation  01/19/2012    Procedure: AMPUTATION DIGIT;  Surgeon: Colin Rhein, MD;  Location: [facility];  Service: Orthopedics;  Laterality: Right;  right great toe amputation through MTP joint  . Rotator cuff repair Right   . Enteroscopy N/A 10/15/2014    Procedure: ENTEROSCOPY;   Surgeon: Gatha Mayer, MD;  [facility];  Service: Endoscopy;  Laterality: N/A;  . Colonoscopy with propofol N/A 10/17/2014    Procedure: COLONOSCOPY WITH PROPOFOL;  Surgeon: Gatha Mayer, MD;  [facility];  Service: Endoscopy;  Laterality: N/A;  . Abdominal aortagram N/A 02/25/2012    Procedure: ABDOMINAL AORTAGRAM;  Surgeon: Elam Dutch, MD;  Location: [facility] CATH LAB;  Service: Cardiovascular;  Laterality: N/A;    There were no vitals filed for this visit.  Visit Diagnosis:  Abnormality of gait      Subjective Assessment - 10/23/15 1110    Subjective Denies back pain with exercises.  Denies pain or falls.   Pertinent History Parkinson's disease diagnosed 08/2015; spinal stenosis; history of bilateral knee replacement-   Patient Stated Goals Pt's goal for therapy is for walking and stability to improve.   Currently in Pain? No/denies                                 PT Education - 10/23/15 1300    Education provided Yes   Education Details Review seated PWR! and standing PWR! with axial trunk rotation in sitting, [employer] PD association booklets, not taking meds with protein meals  Person(s) Educated Patient   Methods Explanation;Demonstration;Verbal cues   Comprehension Verbalized understanding;Returned demonstration             PT Long Term Goals - 10/16/15 1411    PT LONG TERM GOAL #1   Title Pt will be independent with Parkinson's-specific HEP for improved balance, functional strength and posture.   Time 4   Period Weeks   Status New   PT LONG TERM GOAL #2   Title Pt will perform TUG and TUG cognitive with less than 10% difference to demonstrate improved ability for dual tasking with gait.   Time 4   Period Days   Status New   PT LONG TERM GOAL #3   Title Pt will improve Berg Balance score to at least 32/56 for decreased fall risk.   Time 4   Period Weeks   Status New   PT LONG TERM GOAL #4   Title Pt will  improve gait velocity to at least 2 ft/sec for improved gait efficiency and decreased fall risk.   Time 4   Period Weeks   Status New   PT LONG TERM GOAL #5   Title Pt will verbalize understanding of local Parkinson's disease-related resources.   Time 4   Period Weeks   Status New   Additional Long Term Goals   Additional Long Term Goals Yes   PT LONG TERM GOAL #6   Title Pt will verbalize plans for continued community fitness upon D/C from PT.   Time 4   Period Weeks   Status New               Plan - 10/23/15 1307    Clinical Impression Statement Pt needed reinforcement for HEP given previous visit.  Denies pain with exercises.  Continue PT per POC.   Pt will benefit from skilled therapeutic intervention in order to improve on the following deficits Abnormal gait;Decreased balance;Decreased mobility;Decreased strength;Difficulty walking;Postural dysfunction   Rehab Potential Good   PT Frequency 2x / week   PT Duration 4 weeks   PT Treatment/Interventions ADLs/Self Care Home Management;Therapeutic exercise;Therapeutic activities;Functional mobility training;Gait training;Balance training;Neuromuscular re-education;Patient/family education   PT Next Visit Plan Continue to review sitting and standing PWR! if pt has concerns, transfer training from higher surfaces, balance activities, follow up if pt wants OT or ST (pt was wondering about ST)   Consulted and Agree with Plan of Care Patient        Problem List Patient Active Problem List   Diagnosis Date Noted  . Spinal stenosis of lumbar region 07/15/2015  . Tremors of nervous system 01/29/2015  . Bradycardia 11/07/2014  . Colon polyp 10/17/2014  . Mobitz type 1 second degree atrioventricular block   . Heme + stool 10/15/2014  . Symptomatic anemia 10/14/2014  . Dyspnea on exertion 10/14/2014  . Unsteady gait 10/02/2014  . Chronic systolic CHF (congestive heart failure) ([place]) 05/12/2014  . Orthostatic lightheadedness  03/19/2013  . First degree AV block 07/25/2012  . Atherosclerosis of native arteries of the extremities with ulceration ([place]) 02/17/2012  . HYPOTENSION, UNSPECIFIED 10/08/2010  . SECOND DEGREE AV HEART BLOCK, MOBITZ I 09/04/2010  . S/P AVR 09/01/2010  . CAD, NATIVE VESSEL 06/24/2010  . Diabetes mellitus without complication ([place]) 07/37/1062  . CAD (coronary artery disease) of artery bypass graft 02/16/2010  . OBSTRUCTIVE SLEEP APNEA 07/21/2009  . Hypothyroidism 12/07/2007  . HTN (hypertension) 12/07/2007  . Iron deficiency anemia 12/07/2007  . ELEVATED PROSTATE SPECIFIC ANTIGEN 12/07/2007  Narda Bonds 10/23/2015, 6:34 PM  [facility] [address] [place], [place], [address] Phone: [phone]   Fax:  [phone]  Name: Stanley [name] MRN: [id number] Date of Birth: August 13, 1925   Narda Bonds, [place] 10/23/2015 6:34 PM Phone: [phone] Fax: [phone]

## 2015-10-27 ENCOUNTER — Ambulatory Visit: Payer: Medicare Other | Admitting: Physical Therapy

## 2015-10-29 ENCOUNTER — Other Ambulatory Visit (HOSPITAL_BASED_OUTPATIENT_CLINIC_OR_DEPARTMENT_OTHER): Payer: Medicare Other

## 2015-10-29 ENCOUNTER — Ambulatory Visit: Payer: Medicare Other | Admitting: Physical Therapy

## 2015-10-29 DIAGNOSIS — R293 Abnormal posture: Secondary | ICD-10-CM | POA: Diagnosis not present

## 2015-10-29 DIAGNOSIS — D509 Iron deficiency anemia, unspecified: Secondary | ICD-10-CM

## 2015-10-29 DIAGNOSIS — R269 Unspecified abnormalities of gait and mobility: Secondary | ICD-10-CM

## 2015-10-29 LAB — CBC WITH DIFFERENTIAL/PLATELET
BASO%: 0.5 % (ref 0.0–2.0)
Basophils Absolute: 0 10*3/uL (ref 0.0–0.1)
EOS ABS: 0.1 10*3/uL (ref 0.0–0.5)
EOS%: 1.7 % (ref 0.0–7.0)
HCT: 34.1 % — ABNORMAL LOW (ref 38.4–49.9)
HEMOGLOBIN: 11.1 g/dL — AB (ref 13.0–17.1)
LYMPH%: 18.1 % (ref 14.0–49.0)
MCH: 31.7 pg (ref 27.2–33.4)
MCHC: 32.5 g/dL (ref 32.0–36.0)
MCV: 97.6 fL (ref 79.3–98.0)
MONO#: 1 10*3/uL — ABNORMAL HIGH (ref 0.1–0.9)
MONO%: 12.1 % (ref 0.0–14.0)
NEUT%: 67.6 % (ref 39.0–75.0)
NEUTROS ABS: 5.8 10*3/uL (ref 1.5–6.5)
Platelets: 201 10*3/uL (ref 140–400)
RBC: 3.5 10*6/uL — ABNORMAL LOW (ref 4.20–5.82)
RDW: 13.6 % (ref 11.0–14.6)
WBC: 8.6 10*3/uL (ref 4.0–10.3)
lymph#: 1.5 10*3/uL (ref 0.9–3.3)

## 2015-10-29 LAB — FERRITIN CHCC: Ferritin: 93 ng/ml (ref 22–316)

## 2015-10-29 LAB — IRON AND TIBC CHCC
%SAT: 29 % (ref 20–55)
Iron: 95 ug/dL (ref 42–163)
TIBC: 330 ug/dL (ref 202–409)
UIBC: 235 ug/dL (ref 117–376)

## 2015-10-29 NOTE — Therapy (Signed)
[facility] [address] [place], [place], [address] Phone: [phone]   Fax:  [phone]  Physical Therapy Treatment  Patient Details  Name: Stanley [name] MRN: [id number] Date of Birth: 1925-11-24 Referring Provider: Alonza Bogus, DO  Encounter Date: 10/29/2015      PT End of Session - 10/29/15 1932    Visit Number 4   Number of Visits 9   Date for PT Re-Evaluation 12/14/15   Authorization Type Medicare primary/BCBS secondary-G-code every 10th visit   PT Start Time 1024   PT Stop Time 1104   PT Time Calculation (min) 40 min   Activity Tolerance Patient tolerated treatment well   Behavior During Therapy [facility] for tasks assessed/performed      Past Medical History  Diagnosis Date  . Diabetes mellitus   . BPH (benign prostatic hyperplasia)   . Arthritis   . GERD (gastroesophageal reflux disease)   . Hypertension     pt denies  . Shortness of breath     uses cpap  . 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 Failed total knee, right   . H pylori ulcer     Gastric ulcer  . CAD (coronary artery disease)  s/p CABG   . Aortic stenosis     S/p pericardial valve  . Anemia   . Mobitz type 1 second degree atrioventricular block   . Shingles 05-12-12    Past Surgical History  Procedure Laterality Date  . Aortic valve replacement    . Foot arthrotomy Right   . Tonsillectomy    . Cataract extraction, bilateral    . Humerus fracture surgery    . Replacement total knee Bilateral   . Toe amputation      rt 2nd  . Cardiac catheterization    . Amputation  01/19/2012    Procedure: AMPUTATION DIGIT;  Surgeon: Colin Rhein, MD;  Location: [facility];  Service: Orthopedics;  Laterality: Right;  right great toe amputation through MTP joint  . Rotator cuff repair Right   . Enteroscopy N/A 10/15/2014    Procedure: ENTEROSCOPY;   Surgeon: Gatha Mayer, MD;  [facility];  Service: Endoscopy;  Laterality: N/A;  . Colonoscopy with propofol N/A 10/17/2014    Procedure: COLONOSCOPY WITH PROPOFOL;  Surgeon: Gatha Mayer, MD;  [facility];  Service: Endoscopy;  Laterality: N/A;  . Abdominal aortagram N/A 02/25/2012    Procedure: ABDOMINAL AORTAGRAM;  Surgeon: Elam Dutch, MD;  Location: [facility] CATH LAB;  Service: Cardiovascular;  Laterality: N/A;    There were no vitals filed for this visit.  Visit Diagnosis:  Abnormality of gait  Abnormal posture      Subjective Assessment - 10/29/15 1930    Subjective States he had some left hip pain that started last Saturday and he has been resting it since and it seems better.  Not sure if it was exercises or spinal stenosis.   Pertinent History Parkinson's disease diagnosed 08/2015; spinal stenosis; history of bilateral knee replacement-   Patient Stated Goals Pt's goal for therapy is for walking and stability to improve.   Currently in Pain? No/denies       Performed Seated PWR! Up, Weight shift and step x 20.  Axial trunk rotation seated x 10. Performed Standing PWR! Up, Weight shift and step x 20 with intermittent UE support. Performed Standing Wall Posture Exercise x 60 seconds x  2 with Rollator in front of pt for balance.  Provided as HEP.  See HEP. Performed seated scapular retraction with with red theraband and elbows at 90 x 15 and elbows extended x 15.  Provided as HEP.  See HEP.          PT Education - 10/29/15 1931    Education provided Yes   Education Details [employer]s to [employer]) Educated Patient   Methods Explanation;Demonstration;Verbal cues;Handout   Comprehension Verbalized understanding;Returned demonstration             PT Long Term Goals - 10/16/15 1411    PT LONG TERM GOAL #1   Title Pt will be independent with Parkinson's-specific HEP for improved balance, functional strength and posture.   Time 4    Period Weeks   Status New   PT LONG TERM GOAL #2   Title Pt will perform TUG and TUG cognitive with less than 10% difference to demonstrate improved ability for dual tasking with gait.   Time 4   Period Days   Status New   PT LONG TERM GOAL #3   Title Pt will improve Berg Balance score to at least 32/56 for decreased fall risk.   Time 4   Period Weeks   Status New   PT LONG TERM GOAL #4   Title Pt will improve gait velocity to at least 2 ft/sec for improved gait efficiency and decreased fall risk.   Time 4   Period Weeks   Status New   PT LONG TERM GOAL #5   Title Pt will verbalize understanding of local Parkinson's disease-related resources.   Time 4   Period Weeks   Status New   Additional Long Term Goals   Additional Long Term Goals Yes   PT LONG TERM GOAL #6   Title Pt will verbalize plans for continued community fitness upon D/C from PT.   Time 4   Period Weeks   Status New               Plan - 10/29/15 1933    Clinical Impression Statement Pt not sure if the exercises or his spinal stenosis caused hip pain over the weekend but denies pain at present and denies pain with activities today.  Continue PT per POC.   Pt will benefit from skilled therapeutic intervention in order to improve on the following deficits Abnormal gait;Decreased balance;Decreased mobility;Decreased strength;Difficulty walking;Postural dysfunction   Rehab Potential Good   PT Frequency 2x / week   PT Duration 4 weeks   PT Treatment/Interventions ADLs/Self Care Home Management;Therapeutic exercise;Therapeutic activities;Functional mobility training;Gait training;Balance training;Neuromuscular re-education;Patient/family education   PT Next Visit Plan Review standing wall posture exercise, transfer training from higher surfaces, balance activities.   Consulted and Agree with Plan of Care Patient        Problem List Patient Active Problem List   Diagnosis Date Noted  . Spinal stenosis of  lumbar region 07/15/2015  . Tremors of nervous system 01/29/2015  . Bradycardia 11/07/2014  . Colon polyp 10/17/2014  . Mobitz type 1 second degree atrioventricular block   . Heme + stool 10/15/2014  . Symptomatic anemia 10/14/2014  . Dyspnea on exertion 10/14/2014  . Unsteady gait 10/02/2014  . Chronic systolic CHF (congestive heart failure) ([place]) 05/12/2014  . Orthostatic lightheadedness 03/19/2013  . First degree AV block 07/25/2012  . Atherosclerosis of native arteries of the extremities with ulceration ([place]) 02/17/2012  . HYPOTENSION, UNSPECIFIED 10/08/2010  . SECOND DEGREE  AV HEART BLOCK, MOBITZ I 09/04/2010  . S/P AVR 09/01/2010  . CAD, NATIVE VESSEL 06/24/2010  . Diabetes mellitus without complication ([place]) 16/83/7290  . CAD (coronary artery disease) of artery bypass graft 02/16/2010  . OBSTRUCTIVE SLEEP APNEA 07/21/2009  . Hypothyroidism 12/07/2007  . HTN (hypertension) 12/07/2007  . Iron deficiency anemia 12/07/2007  . ELEVATED PROSTATE SPECIFIC ANTIGEN 12/07/2007    Narda Bonds 10/29/2015, 7:37 PM  [facility] [address] [place], [place], [address] Phone: [phone]   Fax:  [phone]  Name: Stanley [name] MRN: [id number] Date of Birth: 08/27/1925    Narda Bonds, [place] 10/29/2015 7:37 PM Phone: [phone] Fax: [phone]

## 2015-10-29 NOTE — Patient Instructions (Addendum)
EXTENSION: Sitting - Resistance Band (Active)    Sit with right arm at side. Against red resistance band, draw arm backward, as far as possible, keeping elbow straight. Complete 2 sets of 10 repetitions. Perform 1 sessions per day.  Copyright  VHI. All rights reserved.   Scapular Retraction: Rowing (Eccentric) - Arms - Side (Resistance Band)    Hold end of band in each hand. Pull back until elbows are even with trunk. Keep elbows by sides, thumbs up. Release slowly.  Use red resistance band. 10 reps per set, 2 sets per day, ___ days per week.   http://ecce.exer.us/226   Copyright  VHI. All rights reserved.   Exercise for stooped posture    Stand, back to wall with head, shoulders, buttocks and heels all touching wall. Hold position 60 seconds, then take two steps away from wall. Step back to wall and correct position if needed. Repeat 2 times. Do 2 sessions per day.  http://gt2.exer.us/687   Copyright  VHI. All rights reserved.

## 2015-11-04 ENCOUNTER — Telehealth: Payer: Self-pay | Admitting: Family Medicine

## 2015-11-04 MED ORDER — SITAGLIPTIN PHOS-METFORMIN HCL 50-1000 MG PO TABS
ORAL_TABLET | ORAL | Status: DC
Start: 2015-11-04 — End: 2016-01-05

## 2015-11-04 NOTE — Telephone Encounter (Signed)
Refill request for Janumet and a 30 day supply to [facility]. I did send script e-scribe and spoke with pt.

## 2015-11-04 NOTE — Telephone Encounter (Signed)
He still has an open future order for the A1c from July, so he can just make an appt

## 2015-11-05 ENCOUNTER — Other Ambulatory Visit (INDEPENDENT_AMBULATORY_CARE_PROVIDER_SITE_OTHER): Payer: Medicare Other

## 2015-11-05 ENCOUNTER — Encounter: Payer: Self-pay | Admitting: Internal Medicine

## 2015-11-05 ENCOUNTER — Ambulatory Visit (HOSPITAL_BASED_OUTPATIENT_CLINIC_OR_DEPARTMENT_OTHER): Payer: Medicare Other | Admitting: Internal Medicine

## 2015-11-05 ENCOUNTER — Telehealth: Payer: Self-pay | Admitting: Internal Medicine

## 2015-11-05 VITALS — BP 146/57 | HR 67 | Temp 98.4°F | Resp 18 | Ht 66.0 in | Wt 182.8 lb

## 2015-11-05 DIAGNOSIS — D509 Iron deficiency anemia, unspecified: Secondary | ICD-10-CM | POA: Diagnosis present

## 2015-11-05 DIAGNOSIS — E119 Type 2 diabetes mellitus without complications: Secondary | ICD-10-CM | POA: Diagnosis not present

## 2015-11-05 LAB — HEMOGLOBIN A1C: HEMOGLOBIN A1C: 7.3 % — AB (ref 4.6–6.5)

## 2015-11-05 NOTE — Progress Notes (Signed)
[place] [phone]   [phone]  OFFICE PROGRESS NOTE  [name], MD [place] [address]  DIAGNOSIS: Iron deficiency anemia.  PRIOR THERAPY: Feraheme 510 mg IV weekly x 2, last dose was given on 10/18/2014.  CURRENT THERAPY: Integra plus 1 capsule by mouth daily.  INTERVAL HISTORY: Stanley [name] 81 y.o. male returns to the clinic today for follow-up visit. The patient is feeling fine with no specific complaints. The patient is currently on Integra plus 1 capsule by mouth daily and also tolerating it well.  He was also recently diagnosed with Parkinson's disease.  He denied having any significant fatigue or weakness.He denied having any significant chest pain, shortness of breath, cough or hemoptysis. He denied having any disease status. He has no bleeding issues. He had repeat CBC and iron study performed recently and he is here for evaluation and discussion of his lab results.  MEDICAL HISTORY: Past Medical History  Diagnosis Date  . Diabetes mellitus   . BPH (benign prostatic hyperplasia)   . Arthritis   . GERD (gastroesophageal reflux disease)   . Hypertension     pt denies  . Shortness of breath     uses cpap  . Peripheral vascular disease ([place]) 03-06-12    aortogram showed severe bilateral unreconstructible tibial artery disease  . Failed total knee, right ([place])   . H pylori ulcer     Gastric ulcer  . CAD (coronary artery disease)  s/p CABG   . Aortic stenosis     S/p pericardial valve  . Anemia   . Mobitz type 1 second degree atrioventricular block   . Shingles 05-12-12    ALLERGIES:  is allergic to morphine and related; succinylcholine; and ace inhibitors.  MEDICATIONS:  Current Outpatient Prescriptions  Medication Sig Dispense Refill  . ALPRAZolam (XANAX) 0.25 MG tablet take 1 tablet by mouth every 6 hours if needed 120 tablet 5  . aspirin 81 MG tablet Take 81 mg by mouth daily.    .  Calcium Carbonate-Vitamin D (CALCIUM-VITAMIN D) 500-200 MG-UNIT per tablet Take 1 tablet by mouth 2 (two) times daily.     . carbidopa-levodopa (SINEMET) 25-100 MG per tablet Take 1 tablet by mouth 3 (three) times daily. 270 tablet 1  . chlorhexidine (PERIDEX) 0.12 % solution 2 mLs by Mouth Rinse route 2 (two) times daily.  0  . diclofenac (VOLTAREN) 75 MG EC tablet TAKE 1 TABLET TWICE A DAY 180 tablet 0  . diphenoxylate-atropine (LOMOTIL) 2.5-0.025 MG per tablet TAKE 1 TABLET 4 TIMES DAILY AS NEEDED FOR DIARRHEA OR LOOSE STOOLS. 60 tablet 3  . FeFum-FePoly-FA-B Cmp-C-Biot (INTEGRA PLUS) CAPS TAKE 1 CAPSULE EVERY MORNING 90 capsule 3  . finasteride (PROSCAR) 5 MG tablet Take 5 mg by mouth daily.  1  . glucose blood (ONE TOUCH TEST STRIPS) test strip Dispense one touch ultra, test TID and diagnosis code is E11.9 100 each 11  . HYDROcodone-acetaminophen (NORCO) 10-325 MG per tablet Take 1 tablet by mouth every 6 (six) hours as needed for severe pain. 120 tablet 0  . Lancets (ONETOUCH ULTRASOFT) lancets TEST 3 TIMES DAILY AS DIRECTED. 100 each PRN  . mirabegron ER (MYRBETRIQ) 50 MG TB24 tablet Take 50 mg by mouth daily.    . Multiple Vitamin (MULITIVITAMIN WITH MINERALS) TABS Take 1 tablet by mouth daily.    . pantoprazole (PROTONIX) 40 MG tablet Take 1 tablet (40 mg total) by mouth 2 (  two) times daily. 60 tablet 11  . pioglitazone (ACTOS) 45 MG tablet Take 1 tablet (45 mg total) by mouth daily. 90 tablet 3  . predniSONE (STERAPRED UNI-PAK) 5 MG TABS tablet as needed.  0  . prochlorperazine (COMPAZINE) 10 MG tablet take 1 tablet by mouth four times a day as needed for nausea    . rosuvastatin (CRESTOR) 20 MG tablet Take 10 mg by mouth daily.     . sitaGLIPtin-metformin (JANUMET) 50-1000 MG tablet take 1 tablet by mouth twice a day WITH A MEAL. 60 tablet 1  . tamsulosin (FLOMAX) 0.4 MG CAPS capsule Take 0.4 mg by mouth daily after breakfast.      No current facility-administered medications for this  visit.    SURGICAL HISTORY:  Past Surgical History  Procedure Laterality Date  . Aortic valve replacement    . Foot arthrotomy Right   . Tonsillectomy    . Cataract extraction, bilateral    . Humerus fracture surgery    . Replacement total knee Bilateral   . Toe amputation      rt 2nd  . Cardiac catheterization    . Amputation  01/19/2012    Procedure: AMPUTATION DIGIT;  Surgeon: Colin Rhein, MD;  Location: [facility];  Service: Orthopedics;  Laterality: Right;  right great toe amputation through MTP joint  . Rotator cuff repair Right   . Enteroscopy N/A 10/15/2014    Procedure: ENTEROSCOPY;  Surgeon: Gatha Mayer, MD;  Location: [facility];  Service: Endoscopy;  Laterality: N/A;  . Colonoscopy with propofol N/A 10/17/2014    Procedure: COLONOSCOPY WITH PROPOFOL;  Surgeon: Gatha Mayer, MD;  Location: [facility];  Service: Endoscopy;  Laterality: N/A;  . Abdominal aortagram N/A 02/25/2012    Procedure: ABDOMINAL AORTAGRAM;  Surgeon: Elam Dutch, MD;  Location: [facility] CATH LAB;  Service: Cardiovascular;  Laterality: N/A;    REVIEW OF SYSTEMS:  A comprehensive review of systems was negative.   PHYSICAL EXAMINATION: General appearance: alert, cooperative, fatigued and no distress Head: Normocephalic, without obvious abnormality, atraumatic Neck: no adenopathy, no JVD, supple, symmetrical, trachea midline and thyroid not enlarged, symmetric, no tenderness/mass/nodules Lymph nodes: Cervical, supraclavicular, and axillary nodes normal. Resp: clear to auscultation bilaterally Back: symmetric, no curvature. ROM normal. No CVA tenderness. Cardio: Normal S1 and S2 with systolic murmur GI: soft, non-tender; bowel sounds normal; no masses,  no organomegaly Extremities: extremities normal, atraumatic, no cyanosis or edema  ECOG PERFORMANCE STATUS: 1 - Symptomatic but completely ambulatory  Blood pressure 146/57, pulse 67, temperature 98.4 F (36.9 C), temperature  source Oral, resp. rate 18, height 5\' 6"  (1.676 m), weight 182 lb 12.8 oz (82.918 kg), SpO2 96 %.  LABORATORY DATA: Lab Results  Component Value Date   WBC 8.6 10/29/2015   HGB 11.1* 10/29/2015   HCT 34.1* 10/29/2015   MCV 97.6 10/29/2015   PLT 201 10/29/2015      Chemistry      Component Value Date/Time   NA 133* 06/12/2015 1408   NA 130* 10/09/2014 1123   K 4.8 06/12/2015 1408   K 5.3* 10/09/2014 1123   CL 102 06/12/2015 1408   CO2 23 06/12/2015 1408   CO2 21* 10/09/2014 1123   BUN 39* 06/12/2015 1408   BUN 31.2* 10/09/2014 1123   CREATININE 1.20 06/12/2015 1408   CREATININE 1.4* 10/09/2014 1123      Component Value Date/Time   CALCIUM 9.2 06/12/2015 1408   CALCIUM 9.3 10/09/2014 1123  ALKPHOS 73 10/14/2014 1456   ALKPHOS 70 10/09/2014 1123   AST 18 10/14/2014 1456   AST 17 10/09/2014 1123   ALT 16 10/14/2014 1456   ALT 15 10/09/2014 1123   BILITOT <0.2* 10/14/2014 1456   BILITOT 0.22 10/09/2014 1123     Other lab results: Ferritin  93, serum iron  95, total iron binding capacity 235 and iron saturation  29%.  RADIOGRAPHIC STUDIES: No results found.  ASSESSMENT AND PLAN: This is a very pleasant 81 years old white male with iron deficiency anemia status post Feraheme infusion and currently on Integra plus 1 capsule by mouth daily and tolerating it fairly well. The patient is feeling well today was no specific complaints except for mild fatigue.  his hemoglobin, hematocrit as well as iron study and ferritin are stable. I discussed the lab result with the patient today and recommended for him to continue on Integra plus for now. I will see him back for follow-up visit in 3 months with repeat CBC, iron study and ferritin. He was advised to call immediately if he has any concerning symptoms in the interval. The patient voices understanding of current disease status and treatment options and is in agreement with the current care plan.  All questions were answered.  The patient knows to call the clinic with any problems, questions or concerns. We can certainly see the patient much sooner if necessary.  Disclaimer: This note was dictated with voice recognition software. Similar sounding words can inadvertently be transcribed and may not be corrected upon review.

## 2015-11-05 NOTE — Telephone Encounter (Signed)
Pt is aware of lab order, he is going to call and schedule this.

## 2015-11-05 NOTE — Telephone Encounter (Signed)
per pof to sch pt appt-gave pt copy of avs °

## 2015-11-06 ENCOUNTER — Other Ambulatory Visit: Payer: Self-pay | Admitting: Family Medicine

## 2015-11-06 ENCOUNTER — Ambulatory Visit: Payer: Medicare Other | Admitting: Physical Therapy

## 2015-11-06 DIAGNOSIS — R293 Abnormal posture: Secondary | ICD-10-CM | POA: Diagnosis not present

## 2015-11-06 DIAGNOSIS — R2681 Unsteadiness on feet: Secondary | ICD-10-CM

## 2015-11-06 DIAGNOSIS — M6281 Muscle weakness (generalized): Secondary | ICD-10-CM

## 2015-11-06 NOTE — Patient Instructions (Signed)
For your sitting PWR! Step exercise, try the following:  -Step one foot out to the side, then step into the middle, repeat 10 times on each side with just stepping the one leg out at a time.  -Seated marching 10 times, with making sure to sit up tall  Functional Quadriceps: Sit to Stand    Sit on edge of chair (OR YOUR BED, WHICH IS HIGHER), feet flat on floor. Stand upright, extending knees fully. Repeat _5-10___ times per set.  Do __2-3__ sessions per day.  http://orth.exer.us/734   Copyright  VHI. All rights reserved.    Sit to Stand Transfers:  1. Scoot out to the edge of the chair 2. Place your feet flat on the floor, shoulder width apart.  Make sure your feet are tucked just under your knees. 3. Lean forward (nose over toes) with momentum, and stand up tall with your best posture.  If you need to use your arms, use them as a quick boost up to stand. 4. If you are in a low or soft chair, you can lean back and then forward up to stand, in order to get more momentum. 5. Once you are standing, make sure you are looking ahead and standing tall.  To sit down:  1. Back up until you feel the chair behind your legs. 2. Bend at you hips, reaching  Back for you chair, if needed, then slowly squat to sit down on your chair.

## 2015-11-07 ENCOUNTER — Ambulatory Visit: Payer: Medicare Other | Admitting: Physical Therapy

## 2015-11-07 DIAGNOSIS — M6281 Muscle weakness (generalized): Secondary | ICD-10-CM

## 2015-11-07 DIAGNOSIS — R269 Unspecified abnormalities of gait and mobility: Secondary | ICD-10-CM

## 2015-11-07 DIAGNOSIS — R293 Abnormal posture: Secondary | ICD-10-CM | POA: Diagnosis not present

## 2015-11-07 NOTE — Therapy (Signed)
[facility] [address] [place], [place], [address] Phone: [phone]   Fax:  [phone]  Physical Therapy Treatment  Patient Details  Name: Stanley [name] MRN: [id number] Date of Birth: Nov 06, 1925 Referring Provider: Alonza Bogus, DO  Encounter Date: 11/06/2015      PT End of Session - 11/07/15 0756    Visit Number 5   Number of Visits 9   Date for PT Re-Evaluation 12/14/15   Authorization Type Medicare primary/BCBS secondary-G-code every 10th visit   PT Start Time 1317   PT Stop Time 1400   PT Time Calculation (min) 43 min   Equipment Utilized During Treatment Gait belt   Activity Tolerance Patient tolerated treatment well   Behavior During Therapy [facility] for tasks assessed/performed      Past Medical History  Diagnosis Date  . Diabetes mellitus   . BPH (benign prostatic hyperplasia)   . Arthritis   . GERD (gastroesophageal reflux disease)   . Hypertension     pt denies  . Shortness of breath     uses cpap  . Peripheral vascular disease ([place]) 03-06-12    aortogram showed severe bilateral unreconstructible tibial artery disease  . Failed total knee, right ([place])   . H pylori ulcer     Gastric ulcer  . CAD (coronary artery disease)  s/p CABG   . Aortic stenosis     S/p pericardial valve  . Anemia   . Mobitz type 1 second degree atrioventricular block   . Shingles 05-12-12    Past Surgical History  Procedure Laterality Date  . Aortic valve replacement    . Foot arthrotomy Right   . Tonsillectomy    . Cataract extraction, bilateral    . Humerus fracture surgery    . Replacement total knee Bilateral   . Toe amputation      rt 2nd  . Cardiac catheterization    . Amputation  01/19/2012    Procedure: AMPUTATION DIGIT;  Surgeon: Colin Rhein, MD;  Location: [facility];  Service: Orthopedics;  Laterality: Right;  right great toe amputation through MTP joint  . Rotator cuff repair  Right   . Enteroscopy N/A 10/15/2014    Procedure: ENTEROSCOPY;  Surgeon: Gatha Mayer, MD;  [facility];  Service: Endoscopy;  Laterality: N/A;  . Colonoscopy with propofol N/A 10/17/2014    Procedure: COLONOSCOPY WITH PROPOFOL;  Surgeon: Gatha Mayer, MD;  Location: [facility];  Service: Endoscopy;  Laterality: N/A;  . Abdominal aortagram N/A 02/25/2012    Procedure: ABDOMINAL AORTAGRAM;  Surgeon: Elam Dutch, MD;  Location: [facility] CATH LAB;  Service: Cardiovascular;  Laterality: N/A;    There were no vitals filed for this visit.  Visit Diagnosis:  Abnormal posture  Muscle weakness of lower extremity  Unsteady gait      Subjective Assessment - 11/06/15 1322    Subjective The L hip pain is still going on, but not quite as bad as when it started.  Plan to talk with orthopedist about epidural; wonders if R shoe lift is the correct size.   Currently in Pain? Yes   Pain Score 5    Pain Location Back  L hip   Pain Orientation Left   Pain Descriptors / Indicators Aching   Pain Type Chronic pain   Pain Onset More than a month ago   Pain Frequency Intermittent   Aggravating Factors  walking longer distances   Pain Relieving Factors sitting  down/pain medication                         [facility] Adult PT Treatment/Exercise - 11/06/15 0001    Transfers   Transfers Sit to Stand;Stand to Sit   Sit to Stand 6: Modified independent (Device/Increase time);5: Supervision   Stand to Sit 5: Supervision;6: Modified independent (Device/Increase time)   Number of Reps Other reps (comment);Other sets (comment)  5 reps, 2 sets each from 20", 18" surface   Comments 10 reps, 2 sets of sit<>stand from elevated stool height, simulating higher bed height for repetitive sit<>stand for improved functional lower extremity strengthening   Ambulation/Gait   Ambulation/Gait Yes   Ambulation/Gait Assistance 5: Supervision   Ambulation Distance (Feet) 100 Feet  then 20 ft x 3  reps with turning practice, 80 ft   Assistive device 4-wheeled walker   Gait Pattern Step-through pattern;Trunk flexed;Narrow base of support;Trendelenburg   Ambulation Surface Level;Indoor   Gait Comments Practiced turning strategies for gait, multiple reps of short distance gait with turning using 4-wheeled RW, with marching in place strategy most effective for improved weightshifting for turning   Posture/Postural Control   Posture Comments Review of seated postural exercises given last visit (without theraband), and pt verbally reviews standing posture exercises-pt appears to be performing them correctly.   High Level Balance   High Level Balance Comments --           PWR [facility]) - 11/07/15 0749    PWR! Up x 5   PWR! Rock x10   PWR! Twist x 10   PWR! Step x 10   Comments Pt reports that the double leg PWR! Step to the side seems to aggravate his hip at home; discussed and trialed variations of this, including single leg step out to the side and then to the middle, and also trialed marching in place.  Discussed purpose of this exercise is to improve initiation/transition stepping and to engage core muscles in sitting.  Also discussed trialing these variations at home; if pain continues in hip/SI area during the exercises, PT requested pt to stop doing them.       Self Care:  In addition to the education provided below, discussed types of pain:  Joint pain while performing optimal (not ideal, and need to stop exercise until seeing therapist at next session), muscular pain due to using the muscles from exercising (okay to continue exercise, as muscles are being worked to improve functional strength).      PT Education - 11/07/15 0755    Education provided Yes   Education Details Modifications to PWR! Step in sitting, added sit<>stand repeated from bed height to HEP; transfer technique-see pt instructions   Person(s) Educated Patient   Methods Demonstration;Handout   Comprehension  Verbalized understanding;Returned demonstration             PT Long Term Goals - 10/16/15 1411    PT LONG TERM GOAL #1   Title Pt will be independent with Parkinson's-specific HEP for improved balance, functional strength and posture.   Time 4   Period Weeks   Status New   PT LONG TERM GOAL #2   Title Pt will perform TUG and TUG cognitive with less than 10% difference to demonstrate improved ability for dual tasking with gait.   Time 4   Period Days   Status New   PT LONG TERM GOAL #3   Title Pt will improve Berg Balance score to  at least 32/56 for decreased fall risk.   Time 4   Period Weeks   Status New   PT LONG TERM GOAL #4   Title Pt will improve gait velocity to at least 2 ft/sec for improved gait efficiency and decreased fall risk.   Time 4   Period Weeks   Status New   PT LONG TERM GOAL #5   Title Pt will verbalize understanding of local Parkinson's disease-related resources.   Time 4   Period Weeks   Status New   Additional Long Term Goals   Additional Long Term Goals Yes   PT LONG TERM GOAL #6   Title Pt will verbalize plans for continued community fitness upon D/C from PT.   Time 4   Period Weeks   Status New               Plan - 11/07/15 0756    Clinical Impression Statement Pt is continuing to have slight hip pain, which is exacerbated more with walking.  He does not have any increase in his pain during activities in PT session.  Discussed various modifications of exercises to avoid increased pain.  Pt will continue to benefit from further skilled PT to address functional strength, balance, and posture.   Pt will benefit from skilled therapeutic intervention in order to improve on the following deficits Abnormal gait;Decreased balance;Decreased mobility;Decreased strength;Difficulty walking;Postural dysfunction   Rehab Potential Good   PT Frequency 2x / week   PT Duration 4 weeks   PT Treatment/Interventions ADLs/Self Care Home  Management;Therapeutic exercise;Therapeutic activities;Functional mobility training;Gait training;Balance training;Neuromuscular re-education;Patient/family education   PT Next Visit Plan Review updates to HEP; transfer training from higher surfaces, balance activities.   Consulted and Agree with Plan of Care Patient        Problem List Patient Active Problem List   Diagnosis Date Noted  . Spinal stenosis of lumbar region 07/15/2015  . Tremors of nervous system 01/29/2015  . Bradycardia 11/07/2014  . Colon polyp 10/17/2014  . Mobitz type 1 second degree atrioventricular block   . Heme + stool 10/15/2014  . Symptomatic anemia 10/14/2014  . Dyspnea on exertion 10/14/2014  . Unsteady gait 10/02/2014  . Chronic systolic CHF (congestive heart failure) ([place]) 05/12/2014  . Orthostatic lightheadedness 03/19/2013  . First degree AV block 07/25/2012  . Atherosclerosis of native arteries of the extremities with ulceration ([place]) 02/17/2012  . HYPOTENSION, UNSPECIFIED 10/08/2010  . SECOND DEGREE AV HEART BLOCK, MOBITZ I 09/04/2010  . S/P AVR 09/01/2010  . CAD, NATIVE VESSEL 06/24/2010  . Diabetes mellitus without complication ([place]) 0000000  . CAD (coronary artery disease) of artery bypass graft 02/16/2010  . OBSTRUCTIVE SLEEP APNEA 07/21/2009  . Hypothyroidism 12/07/2007  . HTN (hypertension) 12/07/2007  . Iron deficiency anemia 12/07/2007  . ELEVATED PROSTATE SPECIFIC ANTIGEN 12/07/2007    [place] S[place] W. 11/07/2015, 7:59 AM Frazier Butt., PT [facility] [address] [place], [place], [address] Phone: [phone]   Fax:  [phone]  Name: DATRELL JAFRI MRN: [id number] Date of Birth: May 25, 1925

## 2015-11-07 NOTE — Therapy (Signed)
[facility] [address] [place], [place], [address] Phone: [phone]   Fax:  [phone]  Physical Therapy Treatment  Patient Details  Name: Stanley [name] MRN: [id number] Date of Birth: 05/04/1925 Referring Provider: Alonza Bogus, DO  Encounter Date: 11/07/2015      PT End of Session - 11/07/15 2157    Visit Number 6   Number of Visits 9   Date for PT Re-Evaluation 12/14/15   Authorization Type Medicare primary/BCBS secondary-G-code every 10th visit   PT Start Time 1111  Pt arrives late   PT Stop Time 1146   PT Time Calculation (min) 35 min   Equipment Utilized During Treatment Gait belt   Activity Tolerance Patient tolerated treatment well   Behavior During Therapy [facility] for tasks assessed/performed      Past Medical History  Diagnosis Date  . Diabetes mellitus   . BPH (benign prostatic hyperplasia)   . Arthritis   . GERD (gastroesophageal reflux disease)   . Hypertension     pt denies  . Shortness of breath     uses cpap  . Peripheral vascular disease ([place]) 03-06-12    aortogram showed severe bilateral unreconstructible tibial artery disease  . Failed total knee, right ([place])   . H pylori ulcer     Gastric ulcer  . CAD (coronary artery disease)  s/p CABG   . Aortic stenosis     S/p pericardial valve  . Anemia   . Mobitz type 1 second degree atrioventricular block   . Shingles 05-12-12    Past Surgical History  Procedure Laterality Date  . Aortic valve replacement    . Foot arthrotomy Right   . Tonsillectomy    . Cataract extraction, bilateral    . Humerus fracture surgery    . Replacement total knee Bilateral   . Toe amputation      rt 2nd  . Cardiac catheterization    . Amputation  01/19/2012    Procedure: AMPUTATION DIGIT;  Surgeon: Colin Rhein, MD;  Location: [facility];  Service: Orthopedics;  Laterality: Right;  right great toe amputation through MTP joint  .  Rotator cuff repair Right   . Enteroscopy N/A 10/15/2014    Procedure: ENTEROSCOPY;  Surgeon: Gatha Mayer, MD;  Location: [facility];  Service: Endoscopy;  Laterality: N/A;  . Colonoscopy with propofol N/A 10/17/2014    Procedure: COLONOSCOPY WITH PROPOFOL;  Surgeon: Gatha Mayer, MD;  Location: [facility];  Service: Endoscopy;  Laterality: N/A;  . Abdominal aortagram N/A 02/25/2012    Procedure: ABDOMINAL AORTAGRAM;  Surgeon: Elam Dutch, MD;  Location: [facility] CATH LAB;  Service: Cardiovascular;  Laterality: N/A;    There were no vitals filed for this visit.  Visit Diagnosis:  Abnormal posture  Muscle weakness of lower extremity  Abnormality of gait      Subjective Assessment - 11/07/15 2146    Subjective No real change in the pain.     Currently in Pain? Yes   Pain Score 5    Pain Location Back  L hip   Pain Orientation Left   Pain Descriptors / Indicators Aching   Pain Type Chronic pain   Pain Onset More than a month ago   Pain Frequency Intermittent   Aggravating Factors  walking longer distances   Pain Relieving Factors sitting down/pain medication  [facility] Adult PT Treatment/Exercise - 11/07/15 0001    Transfers   Transfers Sit to Stand;Stand to Sit   Sit to Stand 6: Modified independent (Device/Increase time);Without upper extremity assist;With upper extremity assist;With armrests;From chair/3-in-1;From elevated surface   Stand to Sit 6: Modified independent (Device/Increase time);With upper extremity assist;Without upper extremity assist;With armrests;To elevated surface;To chair/3-in-1   Comments 10 reps from 28 inch stool, then 5 reps from 24 inch stool, then 5 reps from 20 inch mat surface, UE support increases as surface height decreases   High Level Balance   High Level Balance Comments Standing at counter:  side step and weigthshift x 10 reps, then forward step and weight shift x 10 reps, then back step and  weigthshift x 10 reps, then stagger stance forward/back weigthshifting x 10 reps each position, then lateral weightshifting x 10 reps at counter.  Standing feet apart, then stagger stance with head turns, head nods, then eyes closed x 10 seconds with cues for upright posture and lessening UE support.  Pt requires several seated rest breaks; however, he does not report any pain during standing exercises.           Self Care:  Discussed modifications to seated PWR! Step exercise given yesterday-pt has not yet performed that at home.  Discussed ways to progress the elevated sit<>stand to slightly lower surfaces at home when performing this from the bed gets too easy.       PT Education - 11/07/15 2156    Education provided Yes   Education Details Discussed current fitness routine and continuing to implement HEP   Person(s) Educated Patient   Methods Explanation   Comprehension Verbalized understanding             PT Long Term Goals - 10/16/15 1411    PT LONG TERM GOAL #1   Title Pt will be independent with Parkinson's-specific HEP for improved balance, functional strength and posture.   Time 4   Period Weeks   Status New   PT LONG TERM GOAL #2   Title Pt will perform TUG and TUG cognitive with less than 10% difference to demonstrate improved ability for dual tasking with gait.   Time 4   Period Days   Status New   PT LONG TERM GOAL #3   Title Pt will improve Berg Balance score to at least 32/56 for decreased fall risk.   Time 4   Period Weeks   Status New   PT LONG TERM GOAL #4   Title Pt will improve gait velocity to at least 2 ft/sec for improved gait efficiency and decreased fall risk.   Time 4   Period Weeks   Status New   PT LONG TERM GOAL #5   Title Pt will verbalize understanding of local Parkinson's disease-related resources.   Time 4   Period Weeks   Status New   Additional Long Term Goals   Additional Long Term Goals Yes   PT LONG TERM GOAL #6   Title Pt  will verbalize plans for continued community fitness upon D/C from PT.   Time 4   Period Weeks   Status New               Plan - 11/07/15 2158    Clinical Impression Statement Pt tolerates standing exercises well today, with no increased complaints of pain during standing weigthshifting and balance exercises.  Pt would benefit from additional practice with standing exercises with addition to HEP in the  next visit.     Pt will benefit from skilled therapeutic intervention in order to improve on the following deficits Abnormal gait;Decreased balance;Decreased mobility;Decreased strength;Difficulty walking;Postural dysfunction   Rehab Potential Good   PT Frequency 2x / week   PT Duration 4 weeks   PT Treatment/Interventions ADLs/Self Care Home Management;Therapeutic exercise;Therapeutic activities;Functional mobility training;Gait training;Balance training;Neuromuscular re-education;Patient/family education   PT Next Visit Plan Work on standing weightshifting exercises to HEP next visit; check goals and discuss plans for discharge/renew   Consulted and Agree with Plan of Care Patient        Problem List Patient Active Problem List   Diagnosis Date Noted  . Spinal stenosis of lumbar region 07/15/2015  . Tremors of nervous system 01/29/2015  . Bradycardia 11/07/2014  . Colon polyp 10/17/2014  . Mobitz type 1 second degree atrioventricular block   . Heme + stool 10/15/2014  . Symptomatic anemia 10/14/2014  . Dyspnea on exertion 10/14/2014  . Unsteady gait 10/02/2014  . Chronic systolic CHF (congestive heart failure) ([place]) 05/12/2014  . Orthostatic lightheadedness 03/19/2013  . First degree AV block 07/25/2012  . Atherosclerosis of native arteries of the extremities with ulceration ([place]) 02/17/2012  . HYPOTENSION, UNSPECIFIED 10/08/2010  . SECOND DEGREE AV HEART BLOCK, MOBITZ I 09/04/2010  . S/P AVR 09/01/2010  . CAD, NATIVE VESSEL 06/24/2010  . Diabetes mellitus without  complication ([place]) 0000000  . CAD (coronary artery disease) of artery bypass graft 02/16/2010  . OBSTRUCTIVE SLEEP APNEA 07/21/2009  . Hypothyroidism 12/07/2007  . HTN (hypertension) 12/07/2007  . Iron deficiency anemia 12/07/2007  . ELEVATED PROSTATE SPECIFIC ANTIGEN 12/07/2007    Haisley Arens W. 11/07/2015, 10:03 PM  Frazier Butt., PT  [facility] [address] [place], [place], [address] Phone: [phone]   Fax:  [phone]  Name: ALLON HELMING MRN: [id number] Date of Birth: 1925/05/12

## 2015-11-10 ENCOUNTER — Ambulatory Visit: Payer: Medicare Other | Admitting: Physical Therapy

## 2015-11-10 DIAGNOSIS — R293 Abnormal posture: Secondary | ICD-10-CM | POA: Diagnosis not present

## 2015-11-10 DIAGNOSIS — M6281 Muscle weakness (generalized): Secondary | ICD-10-CM

## 2015-11-10 DIAGNOSIS — R269 Unspecified abnormalities of gait and mobility: Secondary | ICD-10-CM

## 2015-11-10 NOTE — Patient Instructions (Addendum)
Weight Shift: Lateral (Limits of Stability)    Slowly shift weight to right as far as possible, without taking a step. Return to starting position. Shift to opposite side. Hold each position _1-2__ seconds. (you will be facing counter and shifting your weight side to side) Repeat __10__ times per session. Do __1-2__ sessions per day.   Copyright  VHI. All rights reserved.  Weight Shift: Diagonal    Stand facing the counter, with your feet staggered.  Slowly shift weight forward over right leg. Return to starting position. Shift backward over opposite leg. Hold each position _1-2___ seconds. Repeat _10__ times per session. Do __1-2__ sessions per day.    Copyright  VHI. All rights reserved.  Single Step: Side    Lifting foot off floor, take one step slowly to left side. Return to starting position. Repeat __10__ times per session. Do __1-2__ sessions per day. Repeat to opposite side.  Copyright  VHI. All rights reserved.  Single Step: Forward / Backward    Lifting foot off floor, take one step slowly forward with right leg. Return to starting position. Take one step backward and return. Repeat ____ times per session. Do ____ sessions per day. Repeat with other leg.  Copyright  VHI. All rights reserved.

## 2015-11-11 ENCOUNTER — Encounter: Payer: Self-pay | Admitting: Neurology

## 2015-11-11 ENCOUNTER — Ambulatory Visit: Payer: Medicare Other | Admitting: Physical Therapy

## 2015-11-11 ENCOUNTER — Ambulatory Visit (INDEPENDENT_AMBULATORY_CARE_PROVIDER_SITE_OTHER): Payer: Medicare Other | Admitting: Neurology

## 2015-11-11 VITALS — BP 148/64 | HR 68 | Ht 67.0 in | Wt 183.0 lb

## 2015-11-11 DIAGNOSIS — I6523 Occlusion and stenosis of bilateral carotid arteries: Secondary | ICD-10-CM

## 2015-11-11 DIAGNOSIS — R2681 Unsteadiness on feet: Secondary | ICD-10-CM

## 2015-11-11 DIAGNOSIS — E1142 Type 2 diabetes mellitus with diabetic polyneuropathy: Secondary | ICD-10-CM | POA: Diagnosis not present

## 2015-11-11 DIAGNOSIS — G20 Parkinson's disease: Secondary | ICD-10-CM

## 2015-11-11 DIAGNOSIS — R293 Abnormal posture: Secondary | ICD-10-CM

## 2015-11-11 DIAGNOSIS — R29898 Other symptoms and signs involving the musculoskeletal system: Secondary | ICD-10-CM

## 2015-11-11 DIAGNOSIS — R269 Unspecified abnormalities of gait and mobility: Secondary | ICD-10-CM

## 2015-11-11 MED ORDER — CARBIDOPA-LEVODOPA 25-100 MG PO TABS: ORAL_TABLET | ORAL | Status: DC

## 2015-11-11 NOTE — Progress Notes (Signed)
Stanley [name] was seen today in the movement disorders clinic for neurologic consultation at the request of FRY,STEPHEN A, MD.  The consultation is for the evaluation of tremor and to rule out Parkinson's disease.  I reviewed his primary care records that are available to me.  The patient noted a "intention" tremor that started in the L hand for years ago but over the last year he has noted tremor in the right hand at rest over the last year at rest.  He is right hand hand dominant. He denies head or jaw tremor but records state that he has this.  The patient was started on Mirapex in February, 2016 and was only able to get up to 0.25 at night and only took it for 2 nights and "it knocked me out."  11/11/15 update:  The patient is following up today.  I diagnosed him almost 2 months ago with Parkinson's disease.  I started him on levodopa and he is on carbidopa/levodopa 25/100 tid (8-9am/1pm/5-6pm).   He states that he has gotten better "but I'm not quite there."  Tremor is still present but never goes away.   He was having inner tremor but that is gone.   He states that he reached the point where he improved but isn't improving any more and he wanted to see if we could alter the medication.  R hand tremor and some tremor in the jaw.  No hallucinations.  No falls.  No lightheadedness or near syncope.   He has been attending physical therapy at the neuro[facility]rehabilitation center.  Neuroimaging has not previously been performed.    PREVIOUS MEDICATIONS: Mirapex, 0.25 mg daily at bedtime - only took for 2 days   ALLERGIES:   Allergies  Allergen Reactions  . Morphine And Related Nausea And Vomiting  . Succinylcholine Other (See Comments)    unknown  . Ace Inhibitors Other (See Comments)    cough    CURRENT MEDICATIONS:  Outpatient Encounter Prescriptions as of 11/11/2015  Medication Sig  . ALPRAZolam (XANAX) 0.25 MG tablet take 1 tablet by mouth every 6 hours if needed  . aspirin 81 MG  tablet Take 81 mg by mouth daily.  . Calcium Carbonate-Vitamin D (CALCIUM-VITAMIN D) 500-200 MG-UNIT per tablet Take 1 tablet by mouth 2 (two) times daily.   . carbidopa-levodopa (SINEMET) 25-100 MG tablet 2 in the AM, 1 in the afternoon, 2 in the evening before meals  . diclofenac (VOLTAREN) 75 MG EC tablet TAKE 1 TABLET TWICE A DAY  . diphenoxylate-atropine (LOMOTIL) 2.5-0.025 MG per tablet TAKE 1 TABLET 4 TIMES DAILY AS NEEDED FOR DIARRHEA OR LOOSE STOOLS.  Marland Kitchen FeFum-FePoly-FA-B Cmp-C-Biot (INTEGRA PLUS) CAPS TAKE 1 CAPSULE EVERY MORNING  . finasteride (PROSCAR) 5 MG tablet Take 5 mg by mouth daily.  Marland Kitchen glucose blood (ONE TOUCH TEST STRIPS) test strip Dispense one touch ultra, test TID and diagnosis code is E11.9  . HYDROcodone-acetaminophen (NORCO) 10-325 MG per tablet Take 1 tablet by mouth every 6 (six) hours as needed for severe pain.  . Lancets (ONETOUCH ULTRASOFT) lancets TEST 3 TIMES DAILY AS DIRECTED.  Marland Kitchen mirabegron ER (MYRBETRIQ) 50 MG TB24 tablet Take 50 mg by mouth daily.  . Multiple Vitamin (MULITIVITAMIN WITH MINERALS) TABS Take 1 tablet by mouth daily.  . pantoprazole (PROTONIX) 40 MG tablet Take 1 tablet (40 mg total) by mouth 2 (two) times daily.  . pioglitazone (ACTOS) 45 MG tablet Take 1 tablet (45 mg total) by mouth daily.  Marland Kitchen  prochlorperazine (COMPAZINE) 10 MG tablet take 1 tablet by mouth four times a day as needed for nausea  . rosuvastatin (CRESTOR) 20 MG tablet Take 10 mg by mouth daily.   . sitaGLIPtin-metformin (JANUMET) 50-1000 MG tablet take 1 tablet by mouth twice a day WITH A MEAL.  . tamsulosin (FLOMAX) 0.4 MG CAPS capsule Take 0.4 mg by mouth daily after breakfast.   . [DISCONTINUED] carbidopa-levodopa (SINEMET) 25-100 MG per tablet Take 1 tablet by mouth 3 (three) times daily.  . [DISCONTINUED] chlorhexidine (PERIDEX) 0.12 % solution 2 mLs by Mouth Rinse route 2 (two) times daily.  . [DISCONTINUED] predniSONE (STERAPRED UNI-PAK) 5 MG TABS tablet as needed.   No  facility-administered encounter medications on file as of 11/11/2015.    PAST MEDICAL HISTORY:   Past Medical History  Diagnosis Date  . Diabetes mellitus   . BPH (benign prostatic hyperplasia)   . Arthritis   . GERD (gastroesophageal reflux disease)   . Hypertension     pt denies  . Shortness of breath     uses cpap  . Peripheral vascular disease ([place]) 03-06-12    aortogram showed severe bilateral unreconstructible tibial artery disease  . Failed total knee, right ([place])   . H pylori ulcer     Gastric ulcer  . CAD (coronary artery disease)  s/p CABG   . Aortic stenosis     S/p pericardial valve  . Anemia   . Mobitz type 1 second degree atrioventricular block   . Shingles 05-12-12    PAST SURGICAL HISTORY:   Past Surgical History  Procedure Laterality Date  . Aortic valve replacement    . Foot arthrotomy Right   . Tonsillectomy    . Cataract extraction, bilateral    . Humerus fracture surgery    . Replacement total knee Bilateral   . Toe amputation      rt 2nd  . Cardiac catheterization    . Amputation  01/19/2012    Procedure: AMPUTATION DIGIT;  Surgeon: Colin Rhein, MD;  Location: [facility];  Service: Orthopedics;  Laterality: Right;  right great toe amputation through MTP joint  . Rotator cuff repair Right   . Enteroscopy N/A 10/15/2014    Procedure: ENTEROSCOPY;  Surgeon: Gatha Mayer, MD;  Location: [facility];  Service: Endoscopy;  Laterality: N/A;  . Colonoscopy with propofol N/A 10/17/2014    Procedure: COLONOSCOPY WITH PROPOFOL;  Surgeon: Gatha Mayer, MD;  Location: [facility];  Service: Endoscopy;  Laterality: N/A;  . Abdominal aortagram N/A 02/25/2012    Procedure: ABDOMINAL AORTAGRAM;  Surgeon: Elam Dutch, MD;  Location: [facility] CATH LAB;  Service: Cardiovascular;  Laterality: N/A;    SOCIAL HISTORY:   Social History   Social History  . Marital Status: Married    Spouse Name: N/A  . Number of Children: N/A  . Years of  Education: N/A   Occupational History  . retired     Engineer, drilling (primary care physician)   Social History Main Topics  . Smoking status: Never Smoker   . Smokeless tobacco: Never Used  . Alcohol Use: 4.2 oz/week    7 Glasses of wine per week     Comment: glass of wine or vodka/tonic per night  . Drug Use: No  . Sexual Activity: Not on file   Other Topics Concern  . Not on file   Social History Narrative   Married, retired primary care MD    + children  FAMILY HISTORY:   Family Status  Relation Status Death Age  . Mother Deceased     carotid artery aneurysm  . Father Deceased 25    unknown cause of death  . Brother Deceased     as child  . Sister Deceased 60    amyloid  . Child Alive     2 sons, healthy    ROS:  A complete 10 system review of systems was obtained and was unremarkable apart from what is mentioned above.  PHYSICAL EXAMINATION:    VITALS:   Filed Vitals:   11/11/15 0809  BP: 148/64  Pulse: 68  Height: 5\' 7"  (1.702 m)  Weight: 183 lb (83.008 kg)    GEN:  The patient appears stated age and is in NAD. HEENT:  Normocephalic, atraumatic.  The mucous membranes are moist. The superficial temporal arteries are without ropiness or tenderness. CV:  RRR Lungs:  CTAB Neck/HEME:  There are no carotid bruits bilaterally.  Neurological examination:  Orientation: The patient is alert and oriented x3.  Cranial nerves: There is good facial symmetry.  There is mild facial hypomimia.    Extraocular muscles are intact. The visual fields are full to confrontational testing. The speech is fluent and clear. Soft palate rises symmetrically and there is no tongue deviation. Hearing is intact to conversational tone. Sensation: Sensation is intact to light touch throughout Motor: Strength is 5/5 in the bilateral upper and lower extremities.   Shoulder shrug is equal and symmetric.  There is no pronator drift.   Movement examination: Tone: There is mild  increase tone in the LUE.  All other tone is normal (improved) Abnormal movements: There is a bilateral UE resting tremor, L more than R but both increase with distraction procedures Coordination:  There is decremation with RAM's, with virtually all forms of RAM's, , including alternating supination and pronation of the forearm, hand opening and closing, finger taps, heel taps and toe taps. Gait and Station: The patient has difficulty arising out of a deep-seated chair without the use of the hands. The patient's stride length is decreased; he cannot (refuses) walk without the walker; posture is stooped.     ASSESSMENT/PLAN:  1.  Parkinsonism.  I suspect that this does represent idiopathic Parkinson's disease.  The patient has tremor, bradykinesia, rigidity and postural instability.  -We decided to increase carbidopa/levodopa 25/100 to 1.5 tablets tid but I told him if he has trouble cutting it he can take it 2 in the AM, 1 in the afternoon, 2 in the evening.  -continue PT and talked about safe, CV exercise. 2.  Diabetic peripheral neuropathy  -This will contribute to gait instability.  Safety discussed 3.  Follow up is anticipated in the next few months, sooner should new neurologic issues arise.  Much greater than 50% of this visit was spent in counseling with the patient and the family.  Total face to face time:  25 min

## 2015-11-11 NOTE — Therapy (Signed)
[facility] [address] [place], [place], [address] Phone: [phone]   Fax:  [phone]  Physical Therapy Treatment  Patient Details  Name: Stanley [name] MRN: [id number] Date of Birth: 1925/09/21 Referring Provider: Alonza Bogus, DO  Encounter Date: 11/10/2015      PT End of Session - 11/11/15 0744    Visit Number 7   Number of Visits 9   Date for PT Re-Evaluation 12/14/15   Authorization Type Medicare primary/BCBS secondary-G-code every 10th visit   PT Start Time 1237   PT Stop Time 1316   PT Time Calculation (min) 39 min   Equipment Utilized During Treatment Gait belt   Activity Tolerance Patient tolerated treatment well   Behavior During Therapy [facility] for tasks assessed/performed      Past Medical History  Diagnosis Date  . Diabetes mellitus   . BPH (benign prostatic hyperplasia)   . Arthritis   . GERD (gastroesophageal reflux disease)   . Hypertension     pt denies  . Shortness of breath     uses cpap  . Peripheral vascular disease ([place]) 03-06-12    aortogram showed severe bilateral unreconstructible tibial artery disease  . Failed total knee, right ([place])   . H pylori ulcer     Gastric ulcer  . CAD (coronary artery disease)  s/p CABG   . Aortic stenosis     S/p pericardial valve  . Anemia   . Mobitz type 1 second degree atrioventricular block   . Shingles 05-12-12    Past Surgical History  Procedure Laterality Date  . Aortic valve replacement    . Foot arthrotomy Right   . Tonsillectomy    . Cataract extraction, bilateral    . Humerus fracture surgery    . Replacement total knee Bilateral   . Toe amputation      rt 2nd  . Cardiac catheterization    . Amputation  01/19/2012    Procedure: AMPUTATION DIGIT;  Surgeon: Colin Rhein, MD;  Location: [facility];  Service: Orthopedics;  Laterality: Right;  right great toe amputation through MTP joint  . Rotator cuff repair  Right   . Enteroscopy N/A 10/15/2014    Procedure: ENTEROSCOPY;  Surgeon: Gatha Mayer, MD;  Location: [facility];  Service: Endoscopy;  Laterality: N/A;  . Colonoscopy with propofol N/A 10/17/2014    Procedure: COLONOSCOPY WITH PROPOFOL;  Surgeon: Gatha Mayer, MD;  Location: [facility];  Service: Endoscopy;  Laterality: N/A;  . Abdominal aortagram N/A 02/25/2012    Procedure: ABDOMINAL AORTAGRAM;  Surgeon: Elam Dutch, MD;  [facility] CATH LAB;  Service: Cardiovascular;  Laterality: N/A;    There were no vitals filed for this visit.  Visit Diagnosis:  Abnormal posture  Muscle weakness of lower extremity  Abnormality of gait      Subjective Assessment - 11/10/15 1240    Subjective Planning to go see Dr. Carles Collet in the morning-called to make appointment due to increaed tremors-just feel shaky all over.   Currently in Pain? Yes   Pain Score --  "very minimal"   Pain Location Back  L hip   Pain Orientation Left   Pain Descriptors / Indicators Aching   Pain Type Chronic pain   Pain Onset More than a month ago   Aggravating Factors  walking longer distance   Pain Relieving Factors sititng down/pain medication  Pt called MD to get in earlier for pain relief-orthopedist following  [facility] PT Assessment - 11/10/15 1242    Ambulation/Gait   Gait velocity 16.36 sec= 2 ft/sec   Berg Balance Test   Sit to Stand Able to stand  independently using hands   Standing Unsupported Able to stand 2 minutes with supervision   Sitting with Back Unsupported but Feet Supported on Floor or Stool Able to sit safely and securely 2 minutes   Stand to Sit Controls descent by using hands   Transfers Able to transfer safely, definite need of hands   Standing Unsupported with Eyes Closed Needs help to keep from falling   Berg comment: To be completed next visit   Timed Up and Go Test   Normal TUG (seconds) 22.95   Cognitive TUG (seconds) 23.51                      [facility] Adult PT Treatment/Exercise - 11/10/15 1242    Ambulation/Gait   Ambulation/Gait Yes   Ambulation/Gait Assistance 5: Supervision   Ambulation Distance (Feet) 100 Feet  x 2, 50 ft x 2   Assistive device 4-wheeled walker   Gait Pattern Step-through pattern;Trunk flexed;Narrow base of support;Trendelenburg   Ambulation Surface Level;Indoor   High Level Balance   High Level Balance Comments Standing at counter:  side step and weigthshift x 10 reps, then back step and weigthshift x 10 reps, then stagger stance forward/back weigthshifting x 10 reps each position, then lateral weightshifting x 10 reps at counter.  He does not report any pain during standing exercises.  Added the above exercises to HEP.                PT Education - 11/11/15 0743    Education provided Yes   Education Details Added to HEP-see pt instructions.  Discussed PD meds (in prep for Dr. Carles Collet visit tomorrow), progress with therapy, financial concerns, discussed options to discharge PT this week vs continue   Person(s) Educated Patient   Methods Explanation;Demonstration;Handout   Comprehension Verbalized understanding;Returned demonstration             PT Long Term Goals - 11/11/15 0745    PT LONG TERM GOAL #1   Title Pt will be independent with Parkinson's-specific HEP for improved balance, functional strength and posture.   Time 4   Period Weeks   Status New   PT LONG TERM GOAL #2   Title Pt will perform TUG and TUG cognitive with less than 10% difference to demonstrate improved ability for dual tasking with gait.   Time 4   Period Days   Status Achieved   PT LONG TERM GOAL #3   Title Pt will improve Berg Balance score to at least 32/56 for decreased fall risk.   Time 4   Period Weeks   Status New   PT LONG TERM GOAL #4   Title Pt will improve gait velocity to at least 2 ft/sec for improved gait efficiency and decreased fall risk.   Time 4   Period Weeks   Status Achieved   PT LONG TERM  GOAL #5   Title Pt will verbalize understanding of local Parkinson's disease-related resources.   Time 4   Period Weeks   Status New   PT LONG TERM GOAL #6   Title Pt will verbalize plans for continued community fitness upon D/C from PT.   Time 4   Period Weeks   Status New  Plan - 11/11/15 0746    Clinical Impression Statement Pt has met LTG #2 and 4 met.  The remaining LTGs are to be assessed next visit.  Had discussion with pt regarding progression of goals, limitations of back pain and hip pain.  Pt is scheduled to see both neurologist and orthopedist soon.  Will further discuss POC next visit.   Pt will benefit from skilled therapeutic intervention in order to improve on the following deficits Abnormal gait;Decreased balance;Decreased mobility;Decreased strength;Difficulty walking;Postural dysfunction   Rehab Potential Good   PT Frequency 2x / week   PT Duration 4 weeks   PT Treatment/Interventions ADLs/Self Care Home Management;Therapeutic exercise;Therapeutic activities;Functional mobility training;Gait training;Balance training;Neuromuscular re-education;Patient/family education   PT Next Visit Plan Check remaining goals and discuss plans for discharge/renew   Consulted and Agree with Plan of Care Patient        Problem List Patient Active Problem List   Diagnosis Date Noted  . Spinal stenosis of lumbar region 07/15/2015  . Tremors of nervous system 01/29/2015  . Bradycardia 11/07/2014  . Colon polyp 10/17/2014  . Mobitz type 1 second degree atrioventricular block   . Heme + stool 10/15/2014  . Symptomatic anemia 10/14/2014  . Dyspnea on exertion 10/14/2014  . Unsteady gait 10/02/2014  . Chronic systolic CHF (congestive heart failure) ([place]) 05/12/2014  . Orthostatic lightheadedness 03/19/2013  . First degree AV block 07/25/2012  . Atherosclerosis of native arteries of the extremities with ulceration ([place]) 02/17/2012  . HYPOTENSION, UNSPECIFIED  10/08/2010  . SECOND DEGREE AV HEART BLOCK, MOBITZ I 09/04/2010  . S/P AVR 09/01/2010  . CAD, NATIVE VESSEL 06/24/2010  . Diabetes mellitus without complication ([place]) 54/00/8676  . CAD (coronary artery disease) of artery bypass graft 02/16/2010  . OBSTRUCTIVE SLEEP APNEA 07/21/2009  . Hypothyroidism 12/07/2007  . HTN (hypertension) 12/07/2007  . Iron deficiency anemia 12/07/2007  . ELEVATED PROSTATE SPECIFIC ANTIGEN 12/07/2007    Divya Munshi W. 11/11/2015, 7:55 AM  Frazier Butt., PT  [facility] [address] [place], [place], [address] Phone: [phone]   Fax:  [phone]  Name: Stanley [name] MRN: [id number] Date of Birth: [birth year]

## 2015-11-11 NOTE — Therapy (Signed)
[facility] [address] [place], [place], [address] Phone: [phone]   Fax:  [phone]  Physical Therapy Treatment  Patient Details  Name: Stanley [name] MRN: [id number] Date of Birth: 22-Apr-1925 Referring Provider: Alonza Bogus, DO  Encounter Date: 11/11/2015      PT End of Session - 11/11/15 1202    Visit Number 8   Number of Visits 16  per recert 69/48/54   Date for PT Re-Evaluation 01/10/16   Authorization Type Medicare primary/BCBS secondary-G-code every 10th visit-pt has concerns regarding finances and plans to contact insurance coordinator   PT Start Time 1102   PT Stop Time 1144   PT Time Calculation (min) 42 min   Equipment Utilized During Treatment Gait belt   Activity Tolerance Patient tolerated treatment well  seated rest breaks during therapy   Behavior During Therapy [facility] for tasks assessed/performed      Past Medical History  Diagnosis Date  . Diabetes mellitus   . BPH (benign prostatic hyperplasia)   . Arthritis   . GERD (gastroesophageal reflux disease)   . Hypertension     pt denies  . Shortness of breath     uses cpap  . Peripheral vascular disease ([place]) 03-06-12    aortogram showed severe bilateral unreconstructible tibial artery disease  . Failed total knee, right ([place])   . H pylori ulcer     Gastric ulcer  . CAD (coronary artery disease)  s/p CABG   . Aortic stenosis     S/p pericardial valve  . Anemia   . Mobitz type 1 second degree atrioventricular block   . Shingles 05-12-12    Past Surgical History  Procedure Laterality Date  . Aortic valve replacement    . Foot arthrotomy Right   . Tonsillectomy    . Cataract extraction, bilateral    . Humerus fracture surgery    . Replacement total knee Bilateral   . Toe amputation      rt 2nd  . Cardiac catheterization    . Amputation  01/19/2012    Procedure: AMPUTATION DIGIT;  Surgeon: Colin Rhein, MD;  Location: [facility];  Service: Orthopedics;  Laterality: Right;  right great toe amputation through MTP joint  . Rotator cuff repair Right   . Enteroscopy N/A 10/15/2014    Procedure: ENTEROSCOPY;  Surgeon: Gatha Mayer, MD;  [facility];  Service: Endoscopy;  Laterality: N/A;  . Colonoscopy with propofol N/A 10/17/2014    Procedure: COLONOSCOPY WITH PROPOFOL;  Surgeon: Gatha Mayer, MD;  Location: [facility];  Service: Endoscopy;  Laterality: N/A;  . Abdominal aortagram N/A 02/25/2012    Procedure: ABDOMINAL AORTAGRAM;  Surgeon: Elam Dutch, MD;  Location: [facility] CATH LAB;  Service: Cardiovascular;  Laterality: N/A;    There were no vitals filed for this visit.  Visit Diagnosis:  Abnormal posture  Abnormality of gait  Unsteady gait  Weakness of both legs      Subjective Assessment - 11/11/15 1108    Subjective Saw Dr. Carles Collet this morning and she upped the Sinemet medication; rigidity seems to be a little less.   Currently in Pain? Yes   Pain Score 1    Pain Location Back  L hip   Pain Orientation Left   Pain Descriptors / Indicators Aching   Pain Type Chronic pain   Pain Onset More than a month ago   Pain Frequency Intermittent   Aggravating Factors  worse this  morning than usual   Pain Relieving Factors Hydrocodone helps            [facility] PT Assessment - 11/11/15 0001    Berg Balance Test   Sit to Stand Able to stand  independently using hands   Standing Unsupported Able to stand 2 minutes with supervision   Sitting with Back Unsupported but Feet Supported on Floor or Stool Able to sit safely and securely 2 minutes   Stand to Sit Controls descent by using hands   Transfers Able to transfer safely, definite need of hands   Standing Unsupported with Eyes Closed Needs help to keep from falling   Standing Ubsupported with Feet Together Needs help to attain position but able to stand for 30 seconds with feet together   From Standing, Reach Forward with  Outstretched Arm Can reach forward >5 cm safely (2")   From Standing Position, Pick up Object from Floor Unable to try/needs assist to keep balance   From Standing Position, Turn to Look Behind Over each Shoulder Needs supervision when turning   Turn 360 Degrees Needs assistance while turning   Standing Unsupported, Alternately Place Feet on Step/Stool Needs assistance to keep from falling or unable to try   Standing Unsupported, One Foot in Front Needs help to step but can hold 15 seconds   Standing on One Leg Tries to lift leg/unable to hold 3 seconds but remains standing independently   Total Score 22     Scores <45/56 indicate increased fall risk.                [facility] Adult PT Treatment/Exercise - 11/11/15 0001    Transfers   Transfers Sit to Stand;Stand to Sit   Sit to Stand 6: Modified independent (Device/Increase time);With upper extremity assist;With armrests;From chair/3-in-1   Stand to Sit 6: Modified independent (Device/Increase time);With upper extremity assist;With armrests;To chair/3-in-1   Comments 5 reps throughout session   Ambulation/Gait   Ambulation/Gait Yes   Ambulation/Gait Assistance 5: Supervision   Ambulation Distance (Feet) 100 Feet  x2, then 10 ft x 2   Assistive device 4-wheeled walker   Gait Pattern Step-through pattern;Trunk flexed;Narrow base of support;Trendelenburg   Ambulation Surface Level;Indoor   High Level Balance   High Level Balance Activities Side stepping  at counter, UE support with cues for upright posture   High Level Balance Comments Standing at counter:  side step and weigthshift x 10 reps, then back step and weigthshift x 10 reps, then stagger stance forward/back weigthshifting x 10 reps each position, then lateral weightshifting x 10 reps at counter.  Standing with feet shoulder width apart, then stagger stance with UE support and cues for upright posture-head turns and head nods x 10 reps with supervision.  He does not report  any pain during standing exercises.  Added the above exercises to HEP.       Review of HEP given last visit-pt return demo understanding with pictures of HEP.  Pt has not yet done the standing exercises at home, as he was here yesterday for PT visit as well.           PT Education - 11/11/15 1157    Education provided Yes   Education Details Reviewed HEP, reviewed POC and discussed benefits of continuing PT to address balance (since pt still at fall risk and standing balance activities do not aggravate back); local Parkinson's resources   Person(s) Educated Patient   Methods Explanation   Comprehension Verbalized understanding  PT Long Term Goals - 11/11/15 1110    PT LONG TERM GOAL #1   Title Pt will be independent with Parkinson's-specific HEP for improved balance, functional strength and posture.   Time 4   Period Weeks   Status Achieved   PT LONG TERM GOAL #2   Title Pt will perform TUG and TUG cognitive with less than 10% difference to demonstrate improved ability for dual tasking with gait.   Time 4   Period Days   Status Achieved   PT LONG TERM GOAL #3   Title Pt will improve Berg Balance score to at least 32/56 for decreased fall risk.   Time 4   Period Weeks   Status Not Met   PT LONG TERM GOAL #4   Title Pt will improve gait velocity to at least 2 ft/sec for improved gait efficiency and decreased fall risk.   Time 4   Period Weeks   Status Achieved   PT LONG TERM GOAL #5   Title Pt will verbalize understanding of local Parkinson's disease-related resources.   Time 4   Period Weeks   Status Achieved   Additional Long Term Goals   Additional Long Term Goals Yes   PT LONG TERM GOAL #6   Title Pt will verbalize plans for continued community fitness upon D/C from PT.  GOAL ONGOING, with TARGET of goals 6-9 12/10/15   Time 4   Period Weeks   Status On-going   PT LONG TERM GOAL #7   Title Pt will improve TUG score to less than or equal to 20  seconds for decreased fall risk.   Time 4   Period Weeks   Status New   PT LONG TERM GOAL #8   Title Pt will improve Berg Balance score to at least 27/56 for decreased fall risk.   Time 4   Period Weeks   Status New   PT LONG TERM GOAL  #9   TITLE Pt will improve gait velocity to at least 2.3 ft/sec for improved gait efficiency and safety.   Time 4   Period Weeks   Status New               Plan - 11/11/15 1204    Clinical Impression Statement Pt has met LTG #1, 2, 4, and 5.  LTG #3 not met, with Berg score 22/56 (decreased from 24/56).  LTG #6 ongoing for community fitness.  Pt has made slight functional improvements in gait velocity and TUG scores.  Pt remains at fall risk.  Pt has increased pain with walking; however, he has no complaints with standing exercises in the past few visits.  Due to pt's continued risk of falls and functional lower extremity weakness, and pt's ability to progress to standing exercises without c/o pain, pt would benefit from further skilled PT to address balance, posture, strength and trasnfers.  Recert completed this visit, with LTG 1-5 no longer applicable; LTG 6-9 remain for this POC.    Pt will benefit from skilled therapeutic intervention in order to improve on the following deficits Abnormal gait;Decreased balance;Decreased mobility;Decreased strength;Difficulty walking;Postural dysfunction   Rehab Potential Good   PT Frequency 2x / week   PT Duration 4 weeks   PT Treatment/Interventions ADLs/Self Care Home Management;Therapeutic exercise;Therapeutic activities;Functional mobility training;Gait training;Balance training;Neuromuscular re-education;Patient/family education   PT Next Visit Plan Continue to work on standing balance activities at counter; hip/ankle strategies; ?Try PWR! Moves in modified quadruped at [employer] and  Agree with Plan of Care Patient        Problem List Patient Active Problem List   Diagnosis Date Noted  .  Spinal stenosis of lumbar region 07/15/2015  . Tremors of nervous system 01/29/2015  . Bradycardia 11/07/2014  . Colon polyp 10/17/2014  . Mobitz type 1 second degree atrioventricular block   . Heme + stool 10/15/2014  . Symptomatic anemia 10/14/2014  . Dyspnea on exertion 10/14/2014  . Unsteady gait 10/02/2014  . Chronic systolic CHF (congestive heart failure) ([place]) 05/12/2014  . Orthostatic lightheadedness 03/19/2013  . First degree AV block 07/25/2012  . Atherosclerosis of native arteries of the extremities with ulceration ([place]) 02/17/2012  . HYPOTENSION, UNSPECIFIED 10/08/2010  . SECOND DEGREE AV HEART BLOCK, MOBITZ I 09/04/2010  . S/P AVR 09/01/2010  . CAD, NATIVE VESSEL 06/24/2010  . Diabetes mellitus without complication ([place]) 32/01/3342  . CAD (coronary artery disease) of artery bypass graft 02/16/2010  . OBSTRUCTIVE SLEEP APNEA 07/21/2009  . Hypothyroidism 12/07/2007  . HTN (hypertension) 12/07/2007  . Iron deficiency anemia 12/07/2007  . ELEVATED PROSTATE SPECIFIC ANTIGEN 12/07/2007    Irisa Grimsley W. 11/11/2015, 12:16 PM Frazier Butt., PT  [facility] [address] [place], [place], [address] Phone: [phone]   Fax:  [phone]  Name: CARDER YIN MRN: [id number] Date of Birth: 05-Nov-1925

## 2015-11-12 ENCOUNTER — Encounter: Payer: Self-pay | Admitting: Cardiology

## 2015-11-17 ENCOUNTER — Ambulatory Visit: Payer: Medicare Other | Admitting: Neurology

## 2015-11-19 ENCOUNTER — Telehealth: Payer: Self-pay | Admitting: Family Medicine

## 2015-11-19 ENCOUNTER — Other Ambulatory Visit: Payer: Self-pay | Admitting: *Deleted

## 2015-11-19 ENCOUNTER — Ambulatory Visit: Payer: Medicare Other | Admitting: Physical Therapy

## 2015-11-19 MED ORDER — HYDROCODONE-ACETAMINOPHEN 10-325 MG PO TABS
1.0000 | ORAL_TABLET | Freq: Four times a day (QID) | ORAL | Status: DC | PRN
Start: 2015-11-19 — End: 2015-11-24

## 2015-11-19 MED ORDER — HYDROCODONE-ACETAMINOPHEN 10-325 MG PO TABS: 1.0000 | ORAL_TABLET | Freq: Four times a day (QID) | ORAL | Status: DC | PRN

## 2015-11-19 NOTE — Telephone Encounter (Signed)
Per Dr. Sarajane Jews okay to print 2 more scripts for Norco, pt usually gets a 3 month supply. I printed scripts and Dr. Sarajane Jews signed, pt is here now to pick up

## 2015-11-20 ENCOUNTER — Other Ambulatory Visit: Payer: Self-pay | Admitting: Family Medicine

## 2015-11-21 ENCOUNTER — Ambulatory Visit: Payer: Medicare Other | Attending: Neurology | Admitting: Rehabilitative and Restorative Service Providers"

## 2015-11-21 DIAGNOSIS — R269 Unspecified abnormalities of gait and mobility: Secondary | ICD-10-CM | POA: Insufficient documentation

## 2015-11-21 DIAGNOSIS — R29898 Other symptoms and signs involving the musculoskeletal system: Secondary | ICD-10-CM | POA: Diagnosis present

## 2015-11-21 DIAGNOSIS — R2681 Unsteadiness on feet: Secondary | ICD-10-CM | POA: Insufficient documentation

## 2015-11-21 DIAGNOSIS — M6281 Muscle weakness (generalized): Secondary | ICD-10-CM | POA: Diagnosis present

## 2015-11-21 DIAGNOSIS — R293 Abnormal posture: Secondary | ICD-10-CM | POA: Insufficient documentation

## 2015-11-21 NOTE — Therapy (Signed)
[facility] [address] [place], [place], [address] Phone: [phone]   Fax:  [phone]  Physical Therapy Treatment  Patient Details  Name: Stanley [name] MRN: [id number] Date of Birth: 1925-09-29 Referring Provider: Alonza Bogus, DO  Encounter Date: 11/21/2015      PT End of Session - 11/22/15 0748    Visit Number 9   Number of Visits 16  per recert 70/62/37   Date for PT Re-Evaluation 01/10/16   Authorization Type Medicare primary/BCBS secondary-G-code every 10th visit-pt has concerns regarding finances and plans to contact insurance coordinator   PT Start Time 1235   PT Stop Time 1320   PT Time Calculation (min) 45 min   Equipment Utilized During Treatment Gait belt   Activity Tolerance Patient tolerated treatment well  seated rest breaks during therapy   Behavior During Therapy [facility] for tasks assessed/performed      Past Medical History  Diagnosis Date  . Diabetes mellitus   . BPH (benign prostatic hyperplasia)   . Arthritis   . GERD (gastroesophageal reflux disease)   . Hypertension     pt denies  . Shortness of breath     uses cpap  . Peripheral vascular disease ([place]) 03-06-12    aortogram showed severe bilateral unreconstructible tibial artery disease  . Failed total knee, right ([place])   . H pylori ulcer     Gastric ulcer  . CAD (coronary artery disease)  s/p CABG   . Aortic stenosis     S/p pericardial valve  . Anemia   . Mobitz type 1 second degree atrioventricular block   . Shingles 05-12-12    Past Surgical History  Procedure Laterality Date  . Aortic valve replacement    . Foot arthrotomy Right   . Tonsillectomy    . Cataract extraction, bilateral    . Humerus fracture surgery    . Replacement total knee Bilateral   . Toe amputation      rt 2nd  . Cardiac catheterization    . Amputation  01/19/2012    Procedure: AMPUTATION DIGIT;  Surgeon: Colin Rhein, MD;  Location: [facility];  Service: Orthopedics;  Laterality: Right;  right great toe amputation through MTP joint  . Rotator cuff repair Right   . Enteroscopy N/A 10/15/2014    Procedure: ENTEROSCOPY;  Surgeon: Gatha Mayer, MD;  Location: [facility];  Service: Endoscopy;  Laterality: N/A;  . Colonoscopy with propofol N/A 10/17/2014    Procedure: COLONOSCOPY WITH PROPOFOL;  Surgeon: Gatha Mayer, MD;  Location: [facility];  Service: Endoscopy;  Laterality: N/A;  . Abdominal aortagram N/A 02/25/2012    Procedure: ABDOMINAL AORTAGRAM;  Surgeon: Elam Dutch, MD;  Location: [facility] CATH LAB;  Service: Cardiovascular;  Laterality: N/A;    There were no vitals filed for this visit.  Visit Diagnosis:  Abnormality of gait  Muscle weakness of lower extremity      Subjective Assessment - 11/21/15 1239    Subjective The patient feels that his back is his most limiting factor.   Pertinent History Parkinson's disease diagnosed 08/2015; spinal stenosis; history of bilateral knee replacement-   Patient Stated Goals Pt's goal for therapy is for walking and stability to improve.   Currently in Pain? Yes   Pain Score --  only with walking 5/10   Pain Location Back   Pain Descriptors / Indicators Aching   Pain Type Chronic pain   Pain Onset More than  a month ago   Pain Frequency Intermittent   Aggravating Factors  walking   Pain Relieving Factors pain meds-hydrocodone      THERAPEUTIC EXERCISE: Bridges supine x 10 reps with cues on holding position Passive stretching hamstrings supine Single knee to chest R and L sides with 30 second hold x 1 rep Supine gentle lumbar stretch with towel roll perpendicular to spine at belt level to work towards more neutral spinal position  Seated pelvic tilts ant/post for minimal movement Sit<>stand emphasizing powerful movement and upright posture with dec'd knee bracing against mat surface x 10 reps with CGA Standing heel raises at  [place]: Lateral weight shifting with step to side and return to midline at countertop for UE support in both R and L directions x 10 reps with CGA Standing stepping into stride with return to midline with UE support and CGA to min A Standing in stride ant/posterior rocking with R UE reaching to shoulder height engaging ankles into plantar flexion/dorsiflexion with rocking- pt requires tactile cues at hips and verbal cues to anteriorly translate pelvis Wall bumps decreasing UE support emphasizing hip strategy and use of pelvis for postural control with CGA for safety  Gait: Backwards walking with min A with one UE on countertop emphasizing posture upright + larger stride to engage hip extensors x 10 feet x 4 reps Gait activities with rollater RW and tactile posture cues for upright and verbal cues for longer stride length x 115 feet x 4 reps with rest breaks.  Patient can ambulate this distance without back pain today.        PT Education - 11/21/15 0747    Education provided Yes   Education Details HEP: knee to chest, bridges for mgmt of lumbar stenosis.   Person(s) Educated Patient   Methods Explanation;Demonstration;Handout   Comprehension Verbalized understanding;Returned demonstration          PT Long Term Goals - 11/11/15 1110    PT LONG TERM GOAL #1   Title Pt will be independent with Parkinson's-specific HEP for improved balance, functional strength and posture.   Time 4   Period Weeks   Status Achieved   PT LONG TERM GOAL #2   Title Pt will perform TUG and TUG cognitive with less than 10% difference to demonstrate improved ability for dual tasking with gait.   Time 4   Period Days   Status Achieved   PT LONG TERM GOAL #3   Title Pt will improve Berg Balance score to at least 32/56 for decreased fall risk.   Time 4   Period Weeks   Status Not Met   PT LONG TERM GOAL #4   Title Pt will improve gait velocity to at least 2 ft/sec for  improved gait efficiency and decreased fall risk.   Time 4   Period Weeks   Status Achieved   PT LONG TERM GOAL #5   Title Pt will verbalize understanding of local Parkinson's disease-related resources.   Time 4   Period Weeks   Status Achieved   Additional Long Term Goals   Additional Long Term Goals Yes   PT LONG TERM GOAL #6   Title Pt will verbalize plans for continued community fitness upon D/C from PT.  GOAL ONGOING, with TARGET of goals 6-9 12/10/15   Time 4   Period Weeks   Status On-going   PT LONG TERM GOAL #7   Title Pt will improve TUG score to less than or equal  to 20 seconds for decreased fall risk.   Time 4   Period Weeks   Status New   PT LONG TERM GOAL #8   Title Pt will improve Berg Balance score to at least 27/56 for decreased fall risk.   Time 4   Period Weeks   Status New   PT LONG TERM GOAL  #9   TITLE Pt will improve gait velocity to at least 2.3 ft/sec for improved gait efficiency and safety.   Time 4   Period Weeks   Status New               Plan - 11/21/15 0749    Clinical Impression Statement The patient felt that he could ambulate longer distances in therapy without back pain after stretching.  PT provided knee to chest and stabilization activities for home and recommended he perform with current HEP.   PT Next Visit Plan Continue to work on standing balance activities at counter; hip/ankle strategies; ?Try PWR! Moves in modified quadruped at counter *consider lumbar exercises for neutral spine + stabilization   Consulted and Agree with Plan of Care Patient        Problem List Patient Active Problem List   Diagnosis Date Noted  . Spinal stenosis of lumbar region 07/15/2015  . Tremors of nervous system 01/29/2015  . Bradycardia 11/07/2014  . Colon polyp 10/17/2014  . Mobitz type 1 second degree atrioventricular block   . Heme + stool 10/15/2014  . Symptomatic anemia 10/14/2014  . Dyspnea on exertion 10/14/2014  . Unsteady gait  10/02/2014  . Chronic systolic CHF (congestive heart failure) ([place]) 05/12/2014  . Orthostatic lightheadedness 03/19/2013  . First degree AV block 07/25/2012  . Atherosclerosis of native arteries of the extremities with ulceration ([place]) 02/17/2012  . HYPOTENSION, UNSPECIFIED 10/08/2010  . SECOND DEGREE AV HEART BLOCK, MOBITZ I 09/04/2010  . S/P AVR 09/01/2010  . CAD, NATIVE VESSEL 06/24/2010  . Diabetes mellitus without complication ([place]) 14/97/0263  . CAD (coronary artery disease) of artery bypass graft 02/16/2010  . OBSTRUCTIVE SLEEP APNEA 07/21/2009  . Hypothyroidism 12/07/2007  . HTN (hypertension) 12/07/2007  . Iron deficiency anemia 12/07/2007  . ELEVATED PROSTATE SPECIFIC ANTIGEN 12/07/2007    Cynda Soule, PT 11/22/2015, 7:51 AM  [facility] [address] [place], [place], [address] Phone: [phone]   Fax:  [phone]  Name: Stanley [name] MRN: [id number] Date of Birth: [birth year]

## 2015-11-21 NOTE — Telephone Encounter (Signed)
Per Dr. Sarajane Jews okay to refill script for Keflex.

## 2015-11-21 NOTE — Patient Instructions (Signed)
Bridge    Lie back, legs bent. Lift hips. Hold for 5 seconds. Repeat _10___ times. Do __2__ sessions per day.  http://pm.exer.us/54   Copyright  VHI. All rights reserved.  Knee to Chest (Flexion)    Pull knee toward chest. Feel stretch in lower back or buttock area. Breathing deeply, Hold _30___ seconds. Repeat with other knee. Repeat __3__ times. Do __2__ sessions per day.  http://gt2.exer.us/225   Copyright  VHI. All rights reserved.

## 2015-11-24 ENCOUNTER — Ambulatory Visit: Payer: Medicare Other | Admitting: Rehabilitative and Restorative Service Providers"

## 2015-11-24 ENCOUNTER — Ambulatory Visit (INDEPENDENT_AMBULATORY_CARE_PROVIDER_SITE_OTHER): Payer: Medicare Other | Admitting: Family Medicine

## 2015-11-24 ENCOUNTER — Encounter: Payer: Self-pay | Admitting: Family Medicine

## 2015-11-24 VITALS — BP 122/56 | HR 68 | Temp 97.9°F

## 2015-11-24 DIAGNOSIS — M4806 Spinal stenosis, lumbar region: Secondary | ICD-10-CM

## 2015-11-24 DIAGNOSIS — R251 Tremor, unspecified: Secondary | ICD-10-CM

## 2015-11-24 DIAGNOSIS — E119 Type 2 diabetes mellitus without complications: Secondary | ICD-10-CM | POA: Diagnosis not present

## 2015-11-24 DIAGNOSIS — I10 Essential (primary) hypertension: Secondary | ICD-10-CM | POA: Diagnosis not present

## 2015-11-24 DIAGNOSIS — M48061 Spinal stenosis, lumbar region without neurogenic claudication: Secondary | ICD-10-CM

## 2015-11-24 DIAGNOSIS — I6523 Occlusion and stenosis of bilateral carotid arteries: Secondary | ICD-10-CM | POA: Diagnosis not present

## 2015-11-24 DIAGNOSIS — R2681 Unsteadiness on feet: Secondary | ICD-10-CM

## 2015-11-24 MED ORDER — ROSUVASTATIN CALCIUM 10 MG PO TABS: 10.0000 mg | ORAL_TABLET | Freq: Every day | ORAL | Status: DC

## 2015-11-24 NOTE — Progress Notes (Signed)
   Subjective:    Patient ID: Stanley [name], male    DOB: 05/12/1925, 81 y.o.   MRN: [id number]  HPI Here to fill out forms to get diabetic shoes from [employer]. He feels well in general except for lower back pain from the spinal stenosis. He is scheduled this week for his third epidural steroid injection per Dr. Sherwood Gambler. He has been seeing Dr. Carles Collet for Parkinsonism and he has responded well to Sinemet. His tremors have greatly decreased and his speech is more clear. He had stopped driving for awhile but now he has resumed driving. His last A1c on 11-05-15 was 7.3.    Review of Systems  Constitutional: Negative.   Respiratory: Negative.   Cardiovascular: Negative.   Musculoskeletal: Positive for back pain and gait problem.  Neurological: Positive for tremors. Negative for dizziness, seizures, syncope, facial asymmetry, speech difficulty, weakness, light-headedness, numbness and headaches.       Objective:   Physical Exam  Constitutional: He is oriented to person, place, and time.  Walks with his walker  Neck: No thyromegaly present.  Cardiovascular: Normal rate, regular rhythm, normal heart sounds and intact distal pulses.   Soft 2/6 SM   Pulmonary/Chest: Effort normal and breath sounds normal.  Lymphadenopathy:    He has no cervical adenopathy.  Neurological: He is alert and oriented to person, place, and time.  His hand tremors are greatly diminished and his speech is more articulate          Assessment & Plan:  His diabetes is fairly well controlled. We filled out forms to get his diabetic shoes. His Parkinsons symptoms are improving. HTN is stable.

## 2015-11-24 NOTE — Progress Notes (Signed)
Pre visit review using our clinic review tool, if applicable. No additional management support is needed unless otherwise documented below in the visit note. Pt unable to weigh 

## 2015-11-26 ENCOUNTER — Ambulatory Visit: Payer: Medicare Other | Admitting: Physical Therapy

## 2015-11-26 DIAGNOSIS — R269 Unspecified abnormalities of gait and mobility: Secondary | ICD-10-CM

## 2015-11-26 DIAGNOSIS — R293 Abnormal posture: Secondary | ICD-10-CM

## 2015-11-26 NOTE — Patient Instructions (Addendum)
Bridging    Slowly raise buttocks from floor, keeping stomach tight. Repeat 10 times per set. Do 2 sessions per day.  http://orth.exer.us/1096   Copyright  VHI. All rights reserved.   Knee-to-Chest Stretch: Unilateral    With hand behind right knee, pull knee in to chest until a comfortable stretch is felt in lower back and buttocks. Keep back relaxed. Hold 30 seconds. Repeat 2 times per set. Do 2 sessions per day.  http://orth.exer.us/126   Copyright  VHI. All rights reserved.   Double Knee to Chest (Flexion)    Gently pull both knees toward chest. Feel stretch in lower back or buttock area. Breathing deeply, Hold 30 seconds. Repeat 3 times. Do 2 sessions per day.  http://gt2.exer.us/227   Copyright  VHI. All rights reserved.   Lumbar Rotation: Caudal - Bilateral (Supine)    Feet and knees together, arms outstretched, rotate knees left, turning head in opposite direction, until stretch is felt. Hold 10 seconds. Relax.  Repeat to other side. Repeat 5 times per set. Do 2 sessions per day.  http://orth.exer.us/1020   Copyright  VHI. All rights reserved.   Pelvic Tilt    Lie on back, legs bent. Exhale, tilting top of pelvis back, pubic bone up, to flatten lower back. Inhale, rolling pelvis opposite way, top forward, pubic bone down, arch in back.  Hold for 5 seconds. Repeat 10 times. Do 2 sessions per day.  http://pm.exer.us/4   Copyright  VHI. All rights reserved.

## 2015-11-27 NOTE — Therapy (Signed)
[facility] [address] [place], [place], [address] Phone: [phone]   Fax:  [phone]  Physical Therapy Treatment  Patient Details  Name: Stanley [name] MRN: [id number] Date of Birth: January 17, 1925 Referring Provider: Alonza Bogus, DO  Encounter Date: 11/26/2015      PT End of Session - 11/27/15 2248    Visit Number 10   Number of Visits 16  per recert 27/03/50   Date for PT Re-Evaluation 01/10/16   Authorization Type Medicare primary/BCBS secondary-G-code every 10th visit-pt has concerns regarding finances and plans to contact insurance coordinator   PT Start Time 1416  Pt arrived 16 minutes late for appointment   PT Stop Time 1449   PT Time Calculation (min) 33 min   Equipment Utilized During Treatment Gait belt   Activity Tolerance Patient tolerated treatment well  seated rest breaks during therapy   Behavior During Therapy [facility] for tasks assessed/performed      Past Medical History  Diagnosis Date  . Diabetes mellitus   . BPH (benign prostatic hyperplasia)   . Arthritis   . GERD (gastroesophageal reflux disease)   . Hypertension     pt denies  . Shortness of breath     uses cpap  . Peripheral vascular disease ([place]) 03-06-12    aortogram showed severe bilateral unreconstructible tibial artery disease  . Failed total knee, right ([place])   . H pylori ulcer     Gastric ulcer  . CAD (coronary artery disease)  s/p CABG   . Aortic stenosis     S/p pericardial valve  . Anemia   . Mobitz type 1 second degree atrioventricular block   . Shingles 05-12-12    Past Surgical History  Procedure Laterality Date  . Aortic valve replacement    . Foot arthrotomy Right   . Tonsillectomy    . Cataract extraction, bilateral    . Humerus fracture surgery    . Replacement total knee Bilateral   . Toe amputation      rt 2nd  . Cardiac catheterization    . Amputation  01/19/2012    Procedure: AMPUTATION DIGIT;   Surgeon: Colin Rhein, MD;  Location: [facility];  Service: Orthopedics;  Laterality: Right;  right great toe amputation through MTP joint  . Rotator cuff repair Right   . Enteroscopy N/A 10/15/2014    Procedure: ENTEROSCOPY;  Surgeon: Gatha Mayer, MD;  [facility];  Service: Endoscopy;  Laterality: N/A;  . Colonoscopy with propofol N/A 10/17/2014    Procedure: COLONOSCOPY WITH PROPOFOL;  Surgeon: Gatha Mayer, MD;  Location: [facility];  Service: Endoscopy;  Laterality: N/A;  . Abdominal aortagram N/A 02/25/2012    Procedure: ABDOMINAL AORTAGRAM;  Surgeon: Elam Dutch, MD;  Location: [facility] CATH LAB;  Service: Cardiovascular;  Laterality: N/A;    There were no vitals filed for this visit.  Visit Diagnosis:  Abnormal posture  Abnormality of gait      Subjective Assessment - 11/27/15 2245    Subjective Pt arrived 16 minutes late for session.  Has continued to have back pain but feels like stretches help when he does them.  Denies performing previous HEP given at [place].   Pertinent History Parkinson's disease diagnosed 08/2015; spinal stenosis; history of bilateral knee replacement-   Patient Stated Goals Pt's goal for therapy is for walking and stability to improve.   Currently in Pain? Yes   Pain Score 2  Pain Location Back   Pain Orientation Left   Pain Descriptors / Indicators Aching   Pain Type Chronic pain   Pain Onset More than a month ago   Pain Frequency Intermittent   Aggravating Factors  walking   Pain Relieving Factors hydrocodone        Performed modified quadruped PWR! Of PWR! Up, PWR! Weight shift and PWR! Step at counter with UE support.  Perfomed supine back exercises and provided as HEP-See Patient instructions for details.        PT Education - 11/27/15 2247    Education provided Yes   Education Details HEP   Person(s) Educated Patient   Methods Explanation;Demonstration;Handout   Comprehension  Verbalized understanding;Returned demonstration             PT Long Term Goals - 11/11/15 1110    PT LONG TERM GOAL #1   Title Pt will be independent with Parkinson's-specific HEP for improved balance, functional strength and posture.   Time 4   Period Weeks   Status Achieved   PT LONG TERM GOAL #2   Title Pt will perform TUG and TUG cognitive with less than 10% difference to demonstrate improved ability for dual tasking with gait.   Time 4   Period Days   Status Achieved   PT LONG TERM GOAL #3   Title Pt will improve Berg Balance score to at least 32/56 for decreased fall risk.   Time 4   Period Weeks   Status Not Met   PT LONG TERM GOAL #4   Title Pt will improve gait velocity to at least 2 ft/sec for improved gait efficiency and decreased fall risk.   Time 4   Period Weeks   Status Achieved   PT LONG TERM GOAL #5   Title Pt will verbalize understanding of local Parkinson's disease-related resources.   Time 4   Period Weeks   Status Achieved   Additional Long Term Goals   Additional Long Term Goals Yes   PT LONG TERM GOAL #6   Title Pt will verbalize plans for continued community fitness upon D/C from PT.  GOAL ONGOING, with TARGET of goals 6-9 12/10/15   Time 4   Period Weeks   Status On-going   PT LONG TERM GOAL #7   Title Pt will improve TUG score to less than or equal to 20 seconds for decreased fall risk.   Time 4   Period Weeks   Status New   PT LONG TERM GOAL #8   Title Pt will improve Berg Balance score to at least 27/56 for decreased fall risk.   Time 4   Period Weeks   Status New   PT LONG TERM GOAL  #9   TITLE Pt will improve gait velocity to at least 2.3 ft/sec for improved gait efficiency and safety.   Time 4   Period Weeks   Status New               Plan - 11/27/15 2249    Clinical Impression Statement Pt continues with c/o back pain and reports he is having another injection soon.  Continue PT per POC.   Pt will benefit from  skilled therapeutic intervention in order to improve on the following deficits Abnormal gait;Decreased balance;Decreased mobility;Decreased strength;Difficulty walking;Postural dysfunction   Rehab Potential Good   PT Frequency 2x / week   PT Duration 4 weeks   PT Treatment/Interventions ADLs/Self Care Home Management;Therapeutic exercise;Therapeutic activities;Functional mobility training;Gait training;Balance  training;Neuromuscular re-education;Patient/family education   PT Next Visit Plan Continue to work on standing balance activities at counter; hip/ankle strategies   Consulted and Agree with Plan of Care Patient          G-Codes - 2015-12-07 2250-02-22    Functional Assessment Tool Used BERG 22/56; Gait velocity 2.0 ft/sec;TUG 22.95 seconds      Problem List Patient Active Problem List   Diagnosis Date Noted  . Spinal stenosis of lumbar region 07/15/2015  . Tremors of nervous system 01/29/2015  . Bradycardia 11/07/2014  . Colon polyp 10/17/2014  . Mobitz type 1 second degree atrioventricular block   . Heme + stool 10/15/2014  . Symptomatic anemia 10/14/2014  . Dyspnea on exertion 10/14/2014  . Unsteady gait 10/02/2014  . Chronic systolic CHF (congestive heart failure) ([place]) 05/12/2014  . Orthostatic lightheadedness 03/19/2013  . First degree AV block 07/25/2012  . Atherosclerosis of native arteries of the extremities with ulceration ([place]) 02/17/2012  . HYPOTENSION, UNSPECIFIED 10/08/2010  . SECOND DEGREE AV HEART BLOCK, MOBITZ I 09/04/2010  . S/P AVR 09/01/2010  . CAD, NATIVE VESSEL 06/24/2010  . Diabetes mellitus without complication ([place]) 47/42/5956  . CAD (coronary artery disease) of artery bypass graft 02/16/2010  . OBSTRUCTIVE SLEEP APNEA 07/21/2009  . Hypothyroidism 12/07/2007  . HTN (hypertension) 12/07/2007  . Iron deficiency anemia 12/07/2007  . ELEVATED PROSTATE SPECIFIC ANTIGEN 12/07/2007    Narda Bonds 12-07-15, 10:57 PM  [facility] [address] [place], [place], [address] Phone: [phone]   Fax:  [phone]  Name: Stanley [name] MRN: [id number] Date of Birth: Jan 03, 1925    Narda Bonds, [place] 12/07/15 10:57 PM Phone: [phone] Fax: [phone]  Addendum: G Codes Functional Assessment Tool: Merrilee Jansky, gait velocity, TUG Functional Limitation:  Mobility: Walking and Moving Around Current Status  [phone]): CK Goal Status  [phone]): CJ  Physical Therapy Progress Note  Dates of Reporting Period: 10/15/15 to 11/26/15  Objective Reports of Subjective Statement: Merrilee Jansky slightly decreased, gait velocity increased from eval.  Pt somewhat limited by back and hip pain.  Objective Measurements: Berg 22/56, TUG 22.95 seconds, gait velocity 2 ft/sec  Goal Update: Pt has met 4 of 5 long term goals initially; new long term goals set-see above  Plan: Continue working towards long term goals for improved functional mobility and decreased fall risk.  Reason Skilled Services are Required: Pt continues to be at fall risk; treatment has been impacted by cancellations of patient not feeling well.  Mady Haagensen, PT 11/28/2015 8:23 AM Phone: [phone] Fax: [phone]

## 2015-12-03 ENCOUNTER — Ambulatory Visit: Payer: Medicare Other | Admitting: Physical Therapy

## 2015-12-03 ENCOUNTER — Other Ambulatory Visit: Payer: Self-pay | Admitting: Family Medicine

## 2015-12-03 MED ORDER — ONETOUCH ULTRASOFT LANCETS MISC: Status: AC

## 2015-12-03 NOTE — Telephone Encounter (Signed)
Refill request for Onetouch ultrasoft lancets and send to [facility]. I did send script e-scribe.

## 2015-12-04 ENCOUNTER — Ambulatory Visit: Payer: Medicare Other | Admitting: Physical Therapy

## 2015-12-04 DIAGNOSIS — R269 Unspecified abnormalities of gait and mobility: Secondary | ICD-10-CM | POA: Diagnosis not present

## 2015-12-04 DIAGNOSIS — M6281 Muscle weakness (generalized): Secondary | ICD-10-CM

## 2015-12-04 NOTE — Therapy (Signed)
[facility] [address] [place], [place], [address] Phone: [phone]   Fax:  [phone]  Physical Therapy Treatment  Patient Details  Name: Stanley [name] MRN: [id number] Date of Birth: Aug 01, 1925 Referring Provider: Alonza Bogus, DO  Encounter Date: 12/04/2015      PT End of Session - 12/04/15 1152    Visit Number 11   Number of Visits 16  per recert 77/82/42   Date for PT Re-Evaluation 01/10/16   Authorization Type Medicare primary/BCBS secondary-G-code every 10th visit-pt has concerns regarding finances and plans to contact insurance coordinator   PT Start Time 1119  pt arrived late for appointment   PT Stop Time 1153   PT Time Calculation (min) 34 min   Equipment Utilized During Treatment Gait belt   Activity Tolerance Patient tolerated treatment well  seated rest breaks during therapy   Behavior During Therapy [facility] for tasks assessed/performed      Past Medical History  Diagnosis Date  . Diabetes mellitus   . BPH (benign prostatic hyperplasia)   . Arthritis   . GERD (gastroesophageal reflux disease)   . Hypertension     pt denies  . Shortness of breath     uses cpap  . Peripheral vascular disease ([place]) 03-06-12    aortogram showed severe bilateral unreconstructible tibial artery disease  . Failed total knee, right ([place])   . H pylori ulcer     Gastric ulcer  . CAD (coronary artery disease)  s/p CABG   . Aortic stenosis     S/p pericardial valve  . Anemia   . Mobitz type 1 second degree atrioventricular block   . Shingles 05-12-12    Past Surgical History  Procedure Laterality Date  . Aortic valve replacement    . Foot arthrotomy Right   . Tonsillectomy    . Cataract extraction, bilateral    . Humerus fracture surgery    . Replacement total knee Bilateral   . Toe amputation      rt 2nd  . Cardiac catheterization    . Amputation  01/19/2012    Procedure: AMPUTATION DIGIT;  Surgeon:  Colin Rhein, MD;  Location: [facility];  Service: Orthopedics;  Laterality: Right;  right great toe amputation through MTP joint  . Rotator cuff repair Right   . Enteroscopy N/A 10/15/2014    Procedure: ENTEROSCOPY;  Surgeon: Gatha Mayer, MD;  Location: [facility];  Service: Endoscopy;  Laterality: N/A;  . Colonoscopy with propofol N/A 10/17/2014    Procedure: COLONOSCOPY WITH PROPOFOL;  Surgeon: Gatha Mayer, MD;  Location: [facility];  Service: Endoscopy;  Laterality: N/A;  . Abdominal aortagram N/A 02/25/2012    Procedure: ABDOMINAL AORTAGRAM;  Surgeon: Elam Dutch, MD;  Location: [facility] CATH LAB;  Service: Cardiovascular;  Laterality: N/A;    There were no vitals filed for this visit.  Visit Diagnosis:  Muscle weakness of lower extremity      Subjective Assessment - 12/04/15 1149    Subjective Pt arrived late for appointment.  Is going to [employer] today about his R knee brace (has had current one since 1990's) and diabetic shoes with R lift.   Pertinent History Parkinson's disease diagnosed 08/2015; spinal stenosis; history of bilateral knee replacement-   Patient Stated Goals Pt's goal for therapy is for walking and stability to improve.   Currently in Pain? Yes   Pain Score 5    Pain Location Back   Pain Orientation  Left   Pain Descriptors / Indicators Aching   Pain Type Chronic pain   Pain Onset More than a month ago   Pain Frequency Intermittent   Aggravating Factors  session today has helped-down to 3/10 after session   Pain Relieving Factors had injection last week       Standing at counter for PWR! Up, PWR! Harris! Step x 15 each with bil UE support and cues for intensity Forward walking at counter with 1 UE support then backward walking with 1 UE support.  Nustep workload 2 x 8 minutes with all 4 extremities.  Gait 120' x 2 and 80' x 1 with rollator and supervision and cues for upright posture.  Forward flexed and R lateral flexion at  trunk. Pt needing several rest breaks during session due to fatigue.         PT Education - 12/04/15 1151    Education provided Yes   Education Details slowly reintroducing recumbent bike at home   Person(s) Educated Patient   Methods Explanation   Comprehension Verbalized understanding             PT Long Term Goals - 11/11/15 1110    PT LONG TERM GOAL #1   Title Pt will be independent with Parkinson's-specific HEP for improved balance, functional strength and posture.   Time 4   Period Weeks   Status Achieved   PT LONG TERM GOAL #2   Title Pt will perform TUG and TUG cognitive with less than 10% difference to demonstrate improved ability for dual tasking with gait.   Time 4   Period Days   Status Achieved   PT LONG TERM GOAL #3   Title Pt will improve Berg Balance score to at least 32/56 for decreased fall risk.   Time 4   Period Weeks   Status Not Met   PT LONG TERM GOAL #4   Title Pt will improve gait velocity to at least 2 ft/sec for improved gait efficiency and decreased fall risk.   Time 4   Period Weeks   Status Achieved   PT LONG TERM GOAL #5   Title Pt will verbalize understanding of local Parkinson's disease-related resources.   Time 4   Period Weeks   Status Achieved   Additional Long Term Goals   Additional Long Term Goals Yes   PT LONG TERM GOAL #6   Title Pt will verbalize plans for continued community fitness upon D/C from PT.  GOAL ONGOING, with TARGET of goals 6-9 12/10/15   Time 4   Period Weeks   Status On-going   PT LONG TERM GOAL #7   Title Pt will improve TUG score to less than or equal to 20 seconds for decreased fall risk.   Time 4   Period Weeks   Status New   PT LONG TERM GOAL #8   Title Pt will improve Berg Balance score to at least 27/56 for decreased fall risk.   Time 4   Period Weeks   Status New   PT LONG TERM GOAL  #9   TITLE Pt will improve gait velocity to at least 2.3 ft/sec for improved gait efficiency and safety.    Time 4   Period Weeks   Status New               Plan - 12/04/15 1727    Clinical Impression Statement Pt reporting improved back pain after injection but feels like tremors were worse  today-pt not sure if it was because he was upset about being late for appointment.  Participates well with rest breaks during session.  Continue PT per POC.   Pt will benefit from skilled therapeutic intervention in order to improve on the following deficits Abnormal gait;Decreased balance;Decreased mobility;Decreased strength;Difficulty walking;Postural dysfunction   Rehab Potential Good   PT Frequency 2x / week   PT Duration 4 weeks   PT Treatment/Interventions ADLs/Self Care Home Management;Therapeutic exercise;Therapeutic activities;Functional mobility training;Gait training;Balance training;Neuromuscular re-education;Patient/family education   PT Next Visit Plan Follow up on using recumbent bike at home and discuss returning to gym for community fitness (per pt request)   Consulted and Agree with Plan of Care Patient        Problem List Patient Active Problem List   Diagnosis Date Noted  . Spinal stenosis of lumbar region 07/15/2015  . Tremors of nervous system 01/29/2015  . Bradycardia 11/07/2014  . Colon polyp 10/17/2014  . Mobitz type 1 second degree atrioventricular block   . Heme + stool 10/15/2014  . Symptomatic anemia 10/14/2014  . Dyspnea on exertion 10/14/2014  . Unsteady gait 10/02/2014  . Chronic systolic CHF (congestive heart failure) ([place]) 05/12/2014  . Orthostatic lightheadedness 03/19/2013  . First degree AV block 07/25/2012  . Atherosclerosis of native arteries of the extremities with ulceration ([place]) 02/17/2012  . HYPOTENSION, UNSPECIFIED 10/08/2010  . SECOND DEGREE AV HEART BLOCK, MOBITZ I 09/04/2010  . S/P AVR 09/01/2010  . CAD, NATIVE VESSEL 06/24/2010  . Diabetes mellitus without complication ([place]) 88/75/7972  . CAD (coronary artery disease) of artery bypass  graft 02/16/2010  . OBSTRUCTIVE SLEEP APNEA 07/21/2009  . Hypothyroidism 12/07/2007  . HTN (hypertension) 12/07/2007  . Iron deficiency anemia 12/07/2007  . ELEVATED PROSTATE SPECIFIC ANTIGEN 12/07/2007    [facility] [address] [place], [place], [address] Phone: [phone]   Fax:  [phone]  Name: Stanley [name] MRN: [id number] Date of Birth: 12/25/1924    Narda Bonds, [place] 12/04/2015 5:37 PM Phone: [phone] Fax: [phone]

## 2015-12-09 ENCOUNTER — Ambulatory Visit: Payer: Medicare Other | Admitting: Neurology

## 2015-12-09 ENCOUNTER — Ambulatory Visit: Payer: Medicare Other | Admitting: Physical Therapy

## 2015-12-09 DIAGNOSIS — R269 Unspecified abnormalities of gait and mobility: Secondary | ICD-10-CM | POA: Diagnosis not present

## 2015-12-09 DIAGNOSIS — R29898 Other symptoms and signs involving the musculoskeletal system: Secondary | ICD-10-CM

## 2015-12-09 NOTE — Therapy (Signed)
[facility] [address] [place], [place], [address] Phone: [phone]   Fax:  [phone]  Physical Therapy Treatment  Patient Details  Name: Stanley [name] MRN: [id number] Date of Birth: [birth year] Referring Provider: Alonza Bogus, DO  Encounter Date: 12/09/2015      PT End of Session - 12/09/15 1934    Visit Number 12   Number of Visits 16  per recert 26/33/35   Date for PT Re-Evaluation 01/10/16   Authorization Type Medicare primary/BCBS secondary-G-code every 10th visit-pt has concerns regarding finances and plans to contact insurance coordinator   PT Start Time 1405   PT Stop Time 1448   PT Time Calculation (min) 43 min   Activity Tolerance Patient tolerated treatment well   Behavior During Therapy [facility] for tasks assessed/performed      Past Medical History  Diagnosis Date  . Diabetes mellitus   . BPH (benign prostatic hyperplasia)   . Arthritis   . GERD (gastroesophageal reflux disease)   . Hypertension     pt denies  . Shortness of breath     uses cpap  . Peripheral vascular disease ([place]) 03-06-12    aortogram showed severe bilateral unreconstructible tibial artery disease  . Failed total knee, right ([place])   . H pylori ulcer     Gastric ulcer  . CAD (coronary artery disease)  s/p CABG   . Aortic stenosis     S/p pericardial valve  . Anemia   . Mobitz type 1 second degree atrioventricular block   . Shingles 05-12-12    Past Surgical History  Procedure Laterality Date  . Aortic valve replacement    . Foot arthrotomy Right   . Tonsillectomy    . Cataract extraction, bilateral    . Humerus fracture surgery    . Replacement total knee Bilateral   . Toe amputation      rt 2nd  . Cardiac catheterization    . Amputation  01/19/2012    Procedure: AMPUTATION DIGIT;  Surgeon: Colin Rhein, MD;  Location: [facility];  Service: Orthopedics;  Laterality: Right;  right great toe  amputation through MTP joint  . Rotator cuff repair Right   . Enteroscopy N/A 10/15/2014    Procedure: ENTEROSCOPY;  Surgeon: Gatha Mayer, MD;  Location: [facility];  Service: Endoscopy;  Laterality: N/A;  . Colonoscopy with propofol N/A 10/17/2014    Procedure: COLONOSCOPY WITH PROPOFOL;  Surgeon: Gatha Mayer, MD;  Location: [facility];  Service: Endoscopy;  Laterality: N/A;  . Abdominal aortagram N/A 02/25/2012    Procedure: ABDOMINAL AORTAGRAM;  Surgeon: Elam Dutch, MD;  [facility]: [facility] CATH LAB;  Service: Cardiovascular;  Laterality: N/A;    There were no vitals filed for this visit.  Visit Diagnosis:  Abnormality of gait  Weakness of both legs      Subjective Assessment - 12/09/15 1414    Subjective Pt reports using his recumbent bike at home x 15 minutes twice since last visit.  Went to [employer] for diabetic shoes and they will adjust his R knee brace to limit hyperextension once shoes arrive.   Pertinent History Parkinson's disease diagnosed 08/2015; spinal stenosis; history of bilateral knee replacement-   Patient Stated Goals Pt's goal for therapy is for walking and stability to improve.   Currently in Pain? Yes   Pain Score 3    Pain Location Back   Pain Orientation Left   Pain Type Chronic pain  Pain Onset More than a month ago   Pain Frequency Intermittent   Pain Relieving Factors injection       Seated bil hamstring stretch x 30 seconds x 3.  Supine-bil SLR AAROM on R and AROM on L x 15 x 2 sets;Quad sets RLE x 15, SAQ LLE x 15 with 2# weight, bridging x 10, bil hamstring stretch contract/relax x 2 minutes each side, bil heel slides x 15 no weight on R and 2# weight on LLE.  Pt needs rest breaks between exercises.           PT Education - 12/09/15 1933    Education provided Yes   Education Details plan on reviewing HEP next session and revising as needed   Person(s) Educated Patient   Methods Explanation   Comprehension Verbalized understanding              PT Long Term Goals - 11/11/15 1110    PT LONG TERM GOAL #1   Title Pt will be independent with Parkinson's-specific HEP for improved balance, functional strength and posture.   Time 4   Period Weeks   Status Achieved   PT LONG TERM GOAL #2   Title Pt will perform TUG and TUG cognitive with less than 10% difference to demonstrate improved ability for dual tasking with gait.   Time 4   Period Days   Status Achieved   PT LONG TERM GOAL #3   Title Pt will improve Berg Balance score to at least 32/56 for decreased fall risk.   Time 4   Period Weeks   Status Not Met   PT LONG TERM GOAL #4   Title Pt will improve gait velocity to at least 2 ft/sec for improved gait efficiency and decreased fall risk.   Time 4   Period Weeks   Status Achieved   PT LONG TERM GOAL #5   Title Pt will verbalize understanding of local Parkinson's disease-related resources.   Time 4   Period Weeks   Status Achieved   Additional Long Term Goals   Additional Long Term Goals Yes   PT LONG TERM GOAL #6   Title Pt will verbalize plans for continued community fitness upon D/C from PT.  GOAL ONGOING, with TARGET of goals 6-9 12/10/15   Time 4   Period Weeks   Status On-going   PT LONG TERM GOAL #7   Title Pt will improve TUG score to less than or equal to 20 seconds for decreased fall risk.   Time 4   Period Weeks   Status New   PT LONG TERM GOAL #8   Title Pt will improve Berg Balance score to at least 27/56 for decreased fall risk.   Time 4   Period Weeks   Status New   PT LONG TERM GOAL  #9   TITLE Pt will improve gait velocity to at least 2.3 ft/sec for improved gait efficiency and safety.   Time 4   Period Weeks   Status New               Plan - 12/09/15 1934    Clinical Impression Statement Pt continues to report improvement in back pain after injection.  Still with significant bil LE weakness overall as well as significant lack of strength in R knee due to multiple  surgeries.  Continue PT per POC.   Pt will benefit from skilled therapeutic intervention in order to improve on the following deficits Abnormal gait;Decreased  balance;Decreased mobility;Decreased strength;Difficulty walking;Postural dysfunction   Rehab Potential Good   PT Frequency 2x / week   PT Duration 4 weeks   PT Treatment/Interventions ADLs/Self Care Home Management;Therapeutic exercise;Therapeutic activities;Functional mobility training;Gait training;Balance training;Neuromuscular re-education;Patient/family education   PT Next Visit Plan Review HEP and revise/condense as needed   Consulted and Agree with Plan of Care Patient        Problem List Patient Active Problem List   Diagnosis Date Noted  . Spinal stenosis of lumbar region 07/15/2015  . Tremors of nervous system 01/29/2015  . Bradycardia 11/07/2014  . Colon polyp 10/17/2014  . Mobitz type 1 second degree atrioventricular block   . Heme + stool 10/15/2014  . Symptomatic anemia 10/14/2014  . Dyspnea on exertion 10/14/2014  . Unsteady gait 10/02/2014  . Chronic systolic CHF (congestive heart failure) ([place]) 05/12/2014  . Orthostatic lightheadedness 03/19/2013  . First degree AV block 07/25/2012  . Atherosclerosis of native arteries of the extremities with ulceration ([place]) 02/17/2012  . HYPOTENSION, UNSPECIFIED 10/08/2010  . SECOND DEGREE AV HEART BLOCK, MOBITZ I 09/04/2010  . S/P AVR 09/01/2010  . CAD, NATIVE VESSEL 06/24/2010  . Diabetes mellitus without complication ([place]) 49/44/9675  . CAD (coronary artery disease) of artery bypass graft 02/16/2010  . OBSTRUCTIVE SLEEP APNEA 07/21/2009  . Hypothyroidism 12/07/2007  . HTN (hypertension) 12/07/2007  . Iron deficiency anemia 12/07/2007  . ELEVATED PROSTATE SPECIFIC ANTIGEN 12/07/2007    Narda Bonds 12/09/2015, 8:04 PM  [facility] [address] [place], [place], [address] Phone:  [phone]   Fax:  [phone]  Name: RAMIE ERMAN MRN: [id number] Date of Birth: 06-29-1925    Narda Bonds, [place] 12/09/2015 8:04 PM Phone: [phone] Fax: [phone]

## 2015-12-10 ENCOUNTER — Ambulatory Visit: Payer: Medicare Other | Admitting: Neurology

## 2015-12-10 ENCOUNTER — Ambulatory Visit: Payer: Medicare Other | Admitting: Physical Therapy

## 2015-12-10 DIAGNOSIS — R269 Unspecified abnormalities of gait and mobility: Secondary | ICD-10-CM

## 2015-12-10 DIAGNOSIS — R29898 Other symptoms and signs involving the musculoskeletal system: Secondary | ICD-10-CM

## 2015-12-10 NOTE — Therapy (Addendum)
[facility] [address] [place], [place], [address] Phone: [phone]   Fax:  [phone]  Physical Therapy Treatment  Patient Details  Name: Stanley [name] MRN: [id number] Date of Birth: [birth year] Referring Provider: Alonza Bogus, DO  Encounter Date: 12/10/2015      PT End of Session - 12/10/15 1549    Visit Number 13   Number of Visits 16  per recert 97/98/92   Date for PT Re-Evaluation 01/10/16   Authorization Type Medicare primary/BCBS secondary-G-code every 10th visit-pt has concerns regarding finances and plans to contact insurance coordinator   PT Start Time 1318   PT Stop Time 1406   PT Time Calculation (min) 48 min   Activity Tolerance Patient tolerated treatment well   Behavior During Therapy [facility] for tasks assessed/performed      Past Medical History  Diagnosis Date  . Diabetes mellitus   . BPH (benign prostatic hyperplasia)   . Arthritis   . GERD (gastroesophageal reflux disease)   . Hypertension     pt denies  . Shortness of breath     uses cpap  . Peripheral vascular disease ([place]) 03-06-12    aortogram showed severe bilateral unreconstructible tibial artery disease  . Failed total knee, right ([place])   . H pylori ulcer     Gastric ulcer  . CAD (coronary artery disease)  s/p CABG   . Aortic stenosis     S/p pericardial valve  . Anemia   . Mobitz type 1 second degree atrioventricular block   . Shingles 05-12-12    Past Surgical History  Procedure Laterality Date  . Aortic valve replacement    . Foot arthrotomy Right   . Tonsillectomy    . Cataract extraction, bilateral    . Humerus fracture surgery    . Replacement total knee Bilateral   . Toe amputation      rt 2nd  . Cardiac catheterization    . Amputation  01/19/2012    Procedure: AMPUTATION DIGIT;  Surgeon: Colin Rhein, MD;  Location: [place];  Service: Orthopedics;  Laterality: Right;  right great toe  amputation through MTP joint  . Rotator cuff repair Right   . Enteroscopy N/A 10/15/2014    Procedure: ENTEROSCOPY;  Surgeon: Gatha Mayer, MD;  [facility];  Service: Endoscopy;  Laterality: N/A;  . Colonoscopy with propofol N/A 10/17/2014    Procedure: COLONOSCOPY WITH PROPOFOL;  Surgeon: Gatha Mayer, MD;  Location: [facility];  Service: Endoscopy;  Laterality: N/A;  . Abdominal aortagram N/A 02/25/2012    Procedure: ABDOMINAL AORTAGRAM;  Surgeon: Elam Dutch, MD;  Location: [facility] CATH LAB;  Service: Cardiovascular;  Laterality: N/A;    There were no vitals filed for this visit.  Visit Diagnosis:  Abnormality of gait  Weakness of both legs      Subjective Assessment - 12/10/15 1543    Subjective Pt did not use recumbent bike yesterday.  Continues to walk at home.  Denies falls.   Pertinent History Parkinson's disease diagnosed 08/2015; spinal stenosis; history of bilateral knee replacement-   Patient Stated Goals Pt's goal for therapy is for walking and stability to improve.   Currently in Pain? Yes   Pain Score 3    Pain Location Back   Pain Orientation Left   Pain Descriptors / Indicators Aching   Pain Type Chronic pain   Pain Onset More than a month ago   Pain Frequency Intermittent  Pain Relieving Factors injection                         [facility] Adult PT Treatment/Exercise - 12/10/15 0001    Transfers   Transfers Sit to Stand;Stand to Sit   Sit to Stand 6: Modified independent (Device/Increase time);With upper extremity assist;With armrests;From chair/3-in-1   Stand to Sit 6: Modified independent (Device/Increase time);With upper extremity assist;With armrests;To chair/3-in-1   Ambulation/Gait   Ambulation/Gait Yes   Ambulation/Gait Assistance 5: Supervision   Ambulation Distance (Feet) 100 Feet  twice   Assistive device 4-wheeled walker   Gait Pattern Step-through pattern;Trunk flexed;Narrow base of support;Trendelenburg    Ambulation Surface Level;Indoor   Gait velocity 11.88 seconds=2.76 ft/second   Berg Balance Test   Sit to Stand Able to stand  independently using hands   Standing Unsupported Able to stand safely 2 minutes   Sitting with Back Unsupported but Feet Supported on Floor or Stool Able to sit safely and securely 2 minutes   Stand to Sit Sits safely with minimal use of hands   Transfers Able to transfer safely, definite need of hands   Standing Unsupported with Eyes Closed Able to stand 10 seconds safely   Standing Ubsupported with Feet Together Needs help to attain position but able to stand for 30 seconds with feet together   From Standing, Reach Forward with Outstretched Arm Can reach forward >12 cm safely (5")   From Standing Position, Pick up Object from Floor Unable to try/needs assist to keep balance   From Standing Position, Turn to Look Behind Over each Shoulder Needs supervision when turning   Turn 360 Degrees Needs assistance while turning   Standing Unsupported, Alternately Place Feet on Step/Stool Needs assistance to keep from falling or unable to try   Standing Unsupported, One Foot in Front Needs help to step but can hold 15 seconds   Standing on One Leg Unable to try or needs assist to prevent fall   Total Score 28   Timed Up and Go Test   TUG Normal TUG   Normal TUG (seconds) 17.5           PWR [facility]) - 12/10/15 1546    PWR! exercises Moves in sitting;Moves in standing   PWR! Up 20   PWR! Rock 20   PWR Step 20   Comments In standing with cues for intensity   PWR! Up 10   PWR! Rock 10   PWR! Twist 10   PWR! Step 10   Comments Twist performed modified with axial rotation side to side with hands on mat and Step modified to single leg marching only             PT Education - 12/10/15 1547    Education provided Yes   Education Details Progress toward goals, plan for renewal with goals of transitioning to community fitness   Person(s) Educated Patient   Methods  Explanation   Comprehension Verbalized understanding             PT Long Term Goals - 12/10/15 1548    PT LONG TERM GOAL #1   Title Pt will be independent with Parkinson's-specific HEP for improved balance, functional strength and posture.   Time 4   Period Weeks   Status Achieved   PT LONG TERM GOAL #2   Title Pt will perform TUG and TUG cognitive with less than 10% difference to demonstrate improved ability for dual tasking with  gait.   Time 4   Period Days   Status Achieved   PT LONG TERM GOAL #3   Title Pt will improve Berg Balance score to at least 32/56 for decreased fall risk.   Time 4   Period Weeks   Status Not Met   PT LONG TERM GOAL #4   Title Pt will improve gait velocity to at least 2 ft/sec for improved gait efficiency and decreased fall risk.   Time 4   Period Weeks   Status Achieved   PT LONG TERM GOAL #5   Title Pt will verbalize understanding of local Parkinson's disease-related resources.   Time 4   Period Weeks   Status Achieved   PT LONG TERM GOAL #6   Title Pt will verbalize plans for continued community fitness upon D/C from PT.  GOAL ONGOING, with TARGET of goals 6-9 12/10/15   Time 4   Period Weeks   Status On-going   PT LONG TERM GOAL #7   Title Pt will improve TUG score to less than or equal to 20 seconds for decreased fall risk.   Baseline 17.5 seconds on 12/10/15   Time 4   Period Weeks   Status Achieved   PT LONG TERM GOAL #8   Title Pt will improve Berg Balance score to at least 27/56 for decreased fall risk.   Baseline 28/56 on 12/10/15   Time 4   Period Weeks   Status Achieved   PT LONG TERM GOAL  #9   TITLE Pt will improve gait velocity to at least 2.3 ft/sec for improved gait efficiency and safety.   Baseline 2.76 ft/sec on 12/10/15   Time 4   Period Weeks   Status Achieved               Plan - 12/10/15 1550    Clinical Impression Statement Pt met goals 7, 8 and 9.  Goal 6 ongoing as continue to develop optimal  community fitness program and progression for patient.  Renewal per Mady Haagensen, PT.   Pt will benefit from skilled therapeutic intervention in order to improve on the following deficits Abnormal gait;Decreased balance;Decreased mobility;Decreased strength;Difficulty walking;Postural dysfunction   Rehab Potential Good   PT Frequency 2x / week   PT Duration 4 weeks   PT Treatment/Interventions ADLs/Self Care Home Management;Therapeutic exercise;Therapeutic activities;Functional mobility training;Gait training;Balance training;Neuromuscular re-education;Patient/family education   PT Next Visit Plan Renewal by Mady Haagensen, PT.  Continue to revise HEP and provide pt weekly grid to track and prioritize fitness.   Consulted and Agree with Plan of Care Patient        Problem List Patient Active Problem List   Diagnosis Date Noted  . Spinal stenosis of lumbar region 07/15/2015  . Tremors of nervous system 01/29/2015  . Bradycardia 11/07/2014  . Colon polyp 10/17/2014  . Mobitz type 1 second degree atrioventricular block   . Heme + stool 10/15/2014  . Symptomatic anemia 10/14/2014  . Dyspnea on exertion 10/14/2014  . Unsteady gait 10/02/2014  . Chronic systolic CHF (congestive heart failure) ([place]) 05/12/2014  . Orthostatic lightheadedness 03/19/2013  . First degree AV block 07/25/2012  . Atherosclerosis of native arteries of the extremities with ulceration ([place]) 02/17/2012  . HYPOTENSION, UNSPECIFIED 10/08/2010  . SECOND DEGREE AV HEART BLOCK, MOBITZ I 09/04/2010  . S/P AVR 09/01/2010  . CAD, NATIVE VESSEL 06/24/2010  . Diabetes mellitus without complication ([place]) 41/63/8453  . CAD (coronary artery disease) of artery bypass graft 02/16/2010  .  OBSTRUCTIVE SLEEP APNEA 07/21/2009  . Hypothyroidism 12/07/2007  . HTN (hypertension) 12/07/2007  . Iron deficiency anemia 12/07/2007  . ELEVATED PROSTATE SPECIFIC ANTIGEN 12/07/2007    Narda Bonds 12/10/2015, 5:52 PM  [facility] [address] [place], [place], [address] Phone: [phone]   Fax:  [phone]  Name: AYAD NIEMAN MRN: [id number] Date of Birth: 1925-01-28    Narda Bonds, [place] 12/10/2015 5:52 PM Phone: [phone] Fax: [phone]

## 2015-12-15 ENCOUNTER — Other Ambulatory Visit: Payer: Self-pay | Admitting: Family Medicine

## 2015-12-16 NOTE — Telephone Encounter (Signed)
Xanax last refilled 05/15/15 for #120 with 5 refills. Patient was last seen 11/24/15. Ok to refill this medication?

## 2015-12-17 ENCOUNTER — Ambulatory Visit: Payer: Medicare Other | Admitting: Physical Therapy

## 2015-12-17 ENCOUNTER — Encounter: Payer: Self-pay | Admitting: *Deleted

## 2015-12-17 DIAGNOSIS — R269 Unspecified abnormalities of gait and mobility: Secondary | ICD-10-CM | POA: Diagnosis not present

## 2015-12-17 DIAGNOSIS — R293 Abnormal posture: Secondary | ICD-10-CM

## 2015-12-17 DIAGNOSIS — M6281 Muscle weakness (generalized): Secondary | ICD-10-CM

## 2015-12-17 DIAGNOSIS — R29898 Other symptoms and signs involving the musculoskeletal system: Secondary | ICD-10-CM

## 2015-12-17 DIAGNOSIS — R2681 Unsteadiness on feet: Secondary | ICD-10-CM

## 2015-12-17 NOTE — Telephone Encounter (Signed)
Call in #[phone] with 5 rf 

## 2015-12-17 NOTE — Patient Instructions (Addendum)
(  Exercise) Monday Tuesday Wednesday Thursday Friday Saturday Sunday   Supine: -Single knee to chest -Double knee to chest           Pelvic Tilts           Trunk rotation           Bridging           Sitting: -Seated PWR! Moves          Scapular Retraction           Seated hamstring stretch  Sit to stand           Standing: -Standing PWR! Moves  -Standing wall posture           At counter: -Forward/back walking  -Side to side weightshift  -Forward/back weightshift             HIP: Hamstrings - Short Sitting    Rest leg on raised surface or on the floor. Keep knee straight. Lift chest to stay sitting tall. Hold __15-30_ seconds. _3 reps per set, 1-2 times per day.  Copyright  VHI. All rights reserved.    Sleeping on Side    Place pillow between knees. Use cervical support under neck and a roll around waist as needed.   Copyright  VHI. All rights reserved.  Sleeping on Back    Place pillow under knees. A pillow with cervical support and a roll around waist are also helpful.   Copyright  VHI. All rights reserved.

## 2015-12-17 NOTE — Therapy (Signed)
[facility] [address] [place], [place], [address] Phone: [phone]   Fax:  [phone]  Physical Therapy Treatment  Patient Details  Name: Stanley [name] MRN: [id number] Date of Birth: 24-Mar-1925 Referring Provider: Alonza Bogus, DO  Encounter Date: 12/17/2015      PT End of Session - 12/17/15 1307    Visit Number 14   Number of Visits 18  per recert [id number] additional visits   Date for PT Re-Evaluation 02/15/16  per [id number] recert   Authorization Type Medicare primary/BCBS secondary-G-code every 10th visit-pt has concerns regarding finances and plans to contact insurance coordinator   PT Start Time 1150   PT Stop Time 1230   PT Time Calculation (min) 40 min   Activity Tolerance Patient tolerated treatment well   Behavior During Therapy [facility] for tasks assessed/performed      Past Medical History  Diagnosis Date  . Diabetes mellitus   . BPH (benign prostatic hyperplasia)   . Arthritis   . GERD (gastroesophageal reflux disease)   . Hypertension     pt denies  . Shortness of breath     uses cpap  . Peripheral vascular disease ([place]) 03-06-12    aortogram showed severe bilateral unreconstructible tibial artery disease  . Failed total knee, right ([place])   . H pylori ulcer     Gastric ulcer  . CAD (coronary artery disease)  s/p CABG   . Aortic stenosis     S/p pericardial valve  . Anemia   . Mobitz type 1 second degree atrioventricular block   . Shingles 05-12-12    Past Surgical History  Procedure Laterality Date  . Aortic valve replacement    . Foot arthrotomy Right   . Tonsillectomy    . Cataract extraction, bilateral    . Humerus fracture surgery    . Replacement total knee Bilateral   . Toe amputation      rt 2nd  . Cardiac catheterization    . Amputation  01/19/2012    Procedure: AMPUTATION DIGIT;  Surgeon: Colin Rhein, MD;  Location: [place];  Service: Orthopedics;   Laterality: Right;  right great toe amputation through MTP joint  . Rotator cuff repair Right   . Enteroscopy N/A 10/15/2014    Procedure: ENTEROSCOPY;  Surgeon: Gatha Mayer, MD;  [facility];  Service: Endoscopy;  Laterality: N/A;  . Colonoscopy with propofol N/A 10/17/2014    Procedure: COLONOSCOPY WITH PROPOFOL;  Surgeon: Gatha Mayer, MD;  [facility];  Service: Endoscopy;  Laterality: N/A;  . Abdominal aortagram N/A 02/25/2012    Procedure: ABDOMINAL AORTAGRAM;  Surgeon: Elam Dutch, MD;  Location: [facility] CATH LAB;  Service: Cardiovascular;  Laterality: N/A;    There were no vitals filed for this visit.  Visit Diagnosis:  Abnormality of gait  Weakness of both legs  Muscle weakness of lower extremity  Abnormal posture  Unsteady gait      Subjective Assessment - 12/17/15 1152    Subjective More pain in my thighs-maybe I've been up and down too much.  Pain just seems more today.   Currently in Pain? Yes   Pain Score 3    Pain Location Back   Pain Orientation Left   Pain Descriptors / Indicators Aching   Pain Type Chronic pain   Pain Onset More than a month ago   Pain Frequency Intermittent   Aggravating Factors  sleeping positions/doing too much aggravates  Pain Relieving Factors medication helps                  Therapeutic Exercise: (Pt utilizes exercise chart provided and discussed from therapist-see below) -Pt performs supine exercises:  -single knee to chest, double knee to chest, 5 reps 5 seconds each (PT provides cues to slow exercise)  -pelvic tilts x 10 reps (cues to perform correctly, to differentiate between this and bridging)  -lower extremity/trunk rotation, as rocking x 5 reps, then as slowed stretch x 5 reps each side  -bridging x 10 reps  -Pt performs sitting exercises:   -Seated PWR! Moves x 5 reps each  -sitting scapular retraction x 5 reps each  -seated hamstring stretch-needs cues for proper  technique  -sit<>stand from 20 inch mat surface x 5 reps with supervision        Self Care:  Provided detailed exercise chart to patient, grouping his current HEP exercises into the following position sets:  Supine, sitting, standing, and counter exercises.  Discussed pt's pain today in relation to sleep positioning, as pt reports his pain varies from day to day, sometimes worse in the morning, as today's morning pain was 8/10.  Discussed and provided information on sleep positioning including pillows under knees in supine and between knees in sidelying.  Discussed POC, reducing to once/week to focus on pt's transition to community fitness and ongoing HEP performance once PT is complete.        PT Education - 12/17/15 1306    Education provided Yes   Education Details Exercise chart with grouping of current HEP into supine, sitting, standing, counter exercises; sleeping positioning, provided handout for seated hamstring stretch   Person(s) Educated Patient   Methods Explanation;Demonstration;Handout   Comprehension Verbalized understanding;Returned demonstration;Need further instruction             PT Long Term Goals - 12/17/15 1313    PT LONG TERM GOAL #1   Title Pt will be independent with progression of HEP to address balance, posture, strength, flexibility.  TARGET 01/16/16   Time 4   Period Weeks   Status New   PT LONG TERM GOAL #2   Title Pt will verbalize plans for continued community fitness upon D/C from PT.  TARGET 01/16/16   Time 4   Period Weeks   Status On-going   PT LONG TERM GOAL #3   Title Pt will report reduction in pain by at least 2 points by end of PT session, in order to improve functional mobility with decreased pain.  TARGET 01/16/16   Time 4   Period Weeks   Status New               Plan - 12/17/15 1308    Clinical Impression Statement Therapy session today focused on grouping pt's current HEP into sets for easier performance, with  therapist listing sets of exercises in chart.  Focused on review of exercises in sets of sititng and supine; will review standing and counter exercises next visit.  Pt has verbalized difficulty in managing exercises, so chart system with sets of exercises implemented to assist pt in optimally performing HEP.  Plan to renew with updated LTGs this visit, decreasing frequency to 1x/wk, for continued progress towards independent HEP, transition to community fitness, with focus on balance, strength, flexibility.   Pt will benefit from skilled therapeutic intervention in order to improve on the following deficits Abnormal gait;Decreased balance;Decreased mobility;Decreased strength;Difficulty walking;Postural dysfunction   Rehab Potential Good  PT Frequency 1x / week   PT Duration 4 weeks  per recert [id number]   PT Treatment/Interventions ADLs/Self Care Home Management;Therapeutic exercise;Therapeutic activities;Functional mobility training;Gait training;Balance training;Neuromuscular re-education;Patient/family education   PT Next Visit Plan Renewal this visit; review standing and counter exercises in context of exercise chart (pt to bring HEP pictures), discuss/review plans for continued community fitness   Consulted and Agree with Plan of Care Patient        Problem List Patient Active Problem List   Diagnosis Date Noted  . Spinal stenosis of lumbar region 07/15/2015  . Tremors of nervous system 01/29/2015  . Bradycardia 11/07/2014  . Colon polyp 10/17/2014  . Mobitz type 1 second degree atrioventricular block   . Heme + stool 10/15/2014  . Symptomatic anemia 10/14/2014  . Dyspnea on exertion 10/14/2014  . Unsteady gait 10/02/2014  . Chronic systolic CHF (congestive heart failure) ([place]) 05/12/2014  . Orthostatic lightheadedness 03/19/2013  . First degree AV block 07/25/2012  . Atherosclerosis of native arteries of the extremities with ulceration ([place]) 02/17/2012  . HYPOTENSION, UNSPECIFIED  10/08/2010  . SECOND DEGREE AV HEART BLOCK, MOBITZ I 09/04/2010  . S/P AVR 09/01/2010  . CAD, NATIVE VESSEL 06/24/2010  . Diabetes mellitus without complication ([place]) 0000000  . CAD (coronary artery disease) of artery bypass graft 02/16/2010  . OBSTRUCTIVE SLEEP APNEA 07/21/2009  . Hypothyroidism 12/07/2007  . HTN (hypertension) 12/07/2007  . Iron deficiency anemia 12/07/2007  . ELEVATED PROSTATE SPECIFIC ANTIGEN 12/07/2007    Frazier Butt. 12/17/2015, 1:23 PM  Frazier Butt., PT [facility] [address] [place], [place], [address] Phone: [phone]   Fax:  [phone]  Name: Stanley [name] MRN: [id number] Date of Birth: 1925-12-01

## 2015-12-23 ENCOUNTER — Ambulatory Visit: Payer: Medicare Other | Attending: Neurology | Admitting: Physical Therapy

## 2015-12-23 DIAGNOSIS — R269 Unspecified abnormalities of gait and mobility: Secondary | ICD-10-CM

## 2015-12-23 DIAGNOSIS — R2689 Other abnormalities of gait and mobility: Secondary | ICD-10-CM | POA: Insufficient documentation

## 2015-12-23 NOTE — Patient Instructions (Signed)
Side-Stepping    Walk to left side with eyes open. Take even steps, leading with same foot. Make sure each foot lifts off the floor. Repeat in opposite direction. Repeat for 2 minutes per session. Do 2 sessions per day.   Copyright  VHI. All rights reserved.   Exercise for stooped posture    Stand, back to wall with head, shoulders, buttocks and heels all touching wall. Hold position 60 seconds, then take two steps away from wall. Step back to wall and correct position if needed. Repeat 2 times. Do 2 sessions per day.  http://[email or web address].exer.us/687   Copyright  VHI. All rights reserved.

## 2015-12-25 NOTE — Therapy (Signed)
[facility] [address] [place], [place], [address] Phone: [phone]   Fax:  [phone]  Physical Therapy Treatment  Patient Details  Name: Stanley [name] MRN: [id number] Date of Birth: 02/26/1925 Referring Provider: Alonza Bogus, DO  Encounter Date: 12/23/2015      PT End of Session - 12/25/15 0859    Visit Number 15   Number of Visits 18  per recert [id number] additional visits   Date for PT Re-Evaluation 02/15/16  per [id number] recert   Authorization Type Medicare primary/BCBS secondary-G-code every 10th visit-pt has concerns regarding finances and plans to contact insurance coordinator   PT Start Time 1320   PT Stop Time 1404   PT Time Calculation (min) 44 min   Activity Tolerance Patient tolerated treatment well   Behavior During Therapy [facility] for tasks assessed/performed      Past Medical History  Diagnosis Date  . Diabetes mellitus   . BPH (benign prostatic hyperplasia)   . Arthritis   . GERD (gastroesophageal reflux disease)   . Hypertension     pt denies  . Shortness of breath     uses cpap  . Peripheral vascular disease ([place]) 03-06-12    aortogram showed severe bilateral unreconstructible tibial artery disease  . Failed total knee, right ([place])   . H pylori ulcer     Gastric ulcer  . CAD (coronary artery disease)  s/p CABG   . Aortic stenosis     S/p pericardial valve  . Anemia   . Mobitz type 1 second degree atrioventricular block   . Shingles 05-12-12    Past Surgical History  Procedure Laterality Date  . Aortic valve replacement    . Foot arthrotomy Right   . Tonsillectomy    . Cataract extraction, bilateral    . Humerus fracture surgery    . Replacement total knee Bilateral   . Toe amputation      rt 2nd  . Cardiac catheterization    . Amputation  01/19/2012    Procedure: AMPUTATION DIGIT;  Surgeon: Colin Rhein, MD;  Location: [facility];  Service: Orthopedics;   Laterality: Right;  right great toe amputation through MTP joint  . Rotator cuff repair Right   . Enteroscopy N/A 10/15/2014    Procedure: ENTEROSCOPY;  Surgeon: Gatha Mayer, MD;  [facility];  Service: Endoscopy;  Laterality: N/A;  . Colonoscopy with propofol N/A 10/17/2014    Procedure: COLONOSCOPY WITH PROPOFOL;  Surgeon: Gatha Mayer, MD;  Location: [facility];  Service: Endoscopy;  Laterality: N/A;  . Abdominal aortagram N/A 02/25/2012    Procedure: ABDOMINAL AORTAGRAM;  Surgeon: Elam Dutch, MD;  Location: [facility] CATH LAB;  Service: Cardiovascular;  Laterality: N/A;    There were no vitals filed for this visit.  Visit Diagnosis:  Abnormality of gait      Subjective Assessment - 12/25/15 0855    Subjective Pt denies falls or changes.  Did not try sleeping with the pillow as instructed previous session-needs a different pillow.   Pertinent History Parkinson's disease diagnosed 08/2015; spinal stenosis; history of bilateral knee replacement-   Patient Stated Goals Pt's goal for therapy is for walking and stability to improve.   Currently in Pain? Yes   Pain Score 2    Pain Location Back   Pain Orientation Left   Pain Descriptors / Indicators Aching   Pain Type Chronic pain   Pain Onset More than a month ago  Pain Frequency Intermittent   Aggravating Factors  increased activity   Pain Relieving Factors medication        Reviewed and performed Standing PWR! Up, Weight shift and Step at counter with UE support x 20 each.  Reviewed and performed standing wall posture x 1 minute x 2.   Reviewed and performed standing at counter for walking forward and backward with 1 UE support, Forward/backward weight shifting and side to side weight shifting and forward step and backward step.  Discussed how to prioritize pts exercises at home and that performing the stretches when having increased back pain would be benefical in addition to when scheduled.  Pt was working out  at [employer] and would like to return there but wants to make sure he was performing appropriate activities.  Discussed with pt that we can review what he was doing next session and revise as needed.          PT Education - 12/25/15 0858    Education provided Yes   Education Details HEP, reviewed grouping of exercises and prioritization, doing stretches as needed for pain in addition to schedule   Person(s) Educated Patient   Methods Explanation;Demonstration;Handout   Comprehension Verbalized understanding             PT Long Term Goals - 12/17/15 1313    PT LONG TERM GOAL #1   Title Pt will be independent with progression of HEP to address balance, posture, strength, flexibility.  TARGET 01/16/16   Time 4   Period Weeks   Status New   PT LONG TERM GOAL #2   Title Pt will verbalize plans for continued community fitness upon D/C from PT.  TARGET 01/16/16   Time 4   Period Weeks   Status On-going   PT LONG TERM GOAL #3   Title Pt will report reduction in pain by at least 2 points by end of PT session, in order to improve functional mobility with decreased pain.  TARGET 01/16/16   Time 4   Period Weeks   Status New               Plan - 12/25/15 0859    Clinical Impression Statement Therapy session focused on final review of HEP and prioritization of exercises.  Pt wanting to return to community fitness so discussed possibly reviewing his workout routine as he was doing previously and revising as needed.  Continue PT per POC.   Pt will benefit from skilled therapeutic intervention in order to improve on the following deficits Abnormal gait;Decreased balance;Decreased mobility;Decreased strength;Difficulty walking;Postural dysfunction   Rehab Potential Good   PT Frequency 1x / week   PT Duration 4 weeks  per recert [id number]   PT Treatment/Interventions ADLs/Self Care Home Management;Therapeutic exercise;Therapeutic activities;Functional mobility training;Gait  training;Balance training;Neuromuscular re-education;Patient/family education   PT Next Visit Plan Review pts previous workout routine from [facility] and revise as needed.   Consulted and Agree with Plan of Care Patient        Problem List Patient Active Problem List   Diagnosis Date Noted  . Spinal stenosis of lumbar region 07/15/2015  . Tremors of nervous system 01/29/2015  . Bradycardia 11/07/2014  . Colon polyp 10/17/2014  . Mobitz type 1 second degree atrioventricular block   . Heme + stool 10/15/2014  . Symptomatic anemia 10/14/2014  . Dyspnea on exertion 10/14/2014  . Unsteady gait 10/02/2014  . Chronic systolic CHF (congestive heart failure) ([place]) 05/12/2014  . Orthostatic lightheadedness 03/19/2013  .  First degree AV block 07/25/2012  . Atherosclerosis of native arteries of the extremities with ulceration ([place]) 02/17/2012  . HYPOTENSION, UNSPECIFIED 10/08/2010  . SECOND DEGREE AV HEART BLOCK, MOBITZ I 09/04/2010  . S/P AVR 09/01/2010  . CAD, NATIVE VESSEL 06/24/2010  . Diabetes mellitus without complication ([place]) 0000000  . CAD (coronary artery disease) of artery bypass graft 02/16/2010  . OBSTRUCTIVE SLEEP APNEA 07/21/2009  . Hypothyroidism 12/07/2007  . HTN (hypertension) 12/07/2007  . Iron deficiency anemia 12/07/2007  . ELEVATED PROSTATE SPECIFIC ANTIGEN 12/07/2007    Narda Bonds 12/25/2015, 9:02 AM  [facility] [address] [place], [place], [address] Phone: [phone]   Fax:  [phone]  Name: Stanley [name] MRN: [id number] Date of Birth: [birth year]    Narda Bonds, [place] 12/25/2015 9:02 AM Phone: [phone] Fax: [phone]

## 2016-01-01 ENCOUNTER — Ambulatory Visit: Payer: Medicare Other | Admitting: Physical Therapy

## 2016-01-01 DIAGNOSIS — R269 Unspecified abnormalities of gait and mobility: Secondary | ICD-10-CM

## 2016-01-01 DIAGNOSIS — R2689 Other abnormalities of gait and mobility: Secondary | ICD-10-CM | POA: Diagnosis not present

## 2016-01-01 NOTE — Therapy (Signed)
[facility] [address] [place], [place], [address] Phone: [phone]   Fax:  [phone]  Physical Therapy Treatment  Patient Details  Name: Stanley [name] MRN: [id number] Date of Birth: July 29, 1925 Referring Provider: Alonza Bogus, DO  Encounter Date: 01/01/2016      PT End of Session - 01/01/16 1428    Visit Number 16   Number of Visits 18  per recert 77/93/90-3 additional visits   Date for PT Re-Evaluation 02/15/16  per 00/92/33 recert   Authorization Type Medicare primary/BCBS secondary-G-code every 10th visit-pt has concerns regarding finances and plans to contact insurance coordinator   PT Start Time 1320   PT Stop Time 1404   PT Time Calculation (min) 44 min   Equipment Utilized During Treatment Gait belt   Activity Tolerance Patient tolerated treatment well  does need rest breaks during session   Behavior During Therapy [facility] for tasks assessed/performed      Past Medical History  Diagnosis Date  . Diabetes mellitus   . BPH (benign prostatic hyperplasia)   . Arthritis   . GERD (gastroesophageal reflux disease)   . Hypertension     pt denies  . Shortness of breath     uses cpap  . Peripheral vascular disease ([place]) 03-06-12    aortogram showed severe bilateral unreconstructible tibial artery disease  . Failed total knee, right ([place])   . H pylori ulcer     Gastric ulcer  . CAD (coronary artery disease)  s/p CABG   . Aortic stenosis     S/p pericardial valve  . Anemia   . Mobitz type 1 second degree atrioventricular block   . Shingles 05-12-12    Past Surgical History  Procedure Laterality Date  . Aortic valve replacement    . Foot arthrotomy Right   . Tonsillectomy    . Cataract extraction, bilateral    . Humerus fracture surgery    . Replacement total knee Bilateral   . Toe amputation      rt 2nd  . Cardiac catheterization    . Amputation  01/19/2012    Procedure: AMPUTATION DIGIT;   Surgeon: Colin Rhein, MD;  Location: [facility];  Service: Orthopedics;  Laterality: Right;  right great toe amputation through MTP joint  . Rotator cuff repair Right   . Enteroscopy N/A 10/15/2014    Procedure: ENTEROSCOPY;  Surgeon: Gatha Mayer, MD;  [facility];  Service: Endoscopy;  Laterality: N/A;  . Colonoscopy with propofol N/A 10/17/2014    Procedure: COLONOSCOPY WITH PROPOFOL;  Surgeon: Gatha Mayer, MD;  Location: [facility];  Service: Endoscopy;  Laterality: N/A;  . Abdominal aortagram N/A 02/25/2012    Procedure: ABDOMINAL AORTAGRAM;  Surgeon: Elam Dutch, MD;  Location[facility] CATH LAB;  Service: Cardiovascular;  Laterality: N/A;    There were no vitals filed for this visit.  Visit Diagnosis:  Abnormality of gait      Subjective Assessment - 01/01/16 1329    Subjective Pain was a 5 but down to a 0 or 1 with meds.  Denies falls or changes.   Pertinent History Parkinson's disease diagnosed 08/2015; spinal stenosis; history of bilateral knee replacement-   Patient Stated Goals Pt's goal for therapy is for walking and stability to improve.   Currently in Pain? Yes   Pain Score 1    Pain Location Back   Pain Orientation Left;Right   Pain Descriptors / Indicators Aching   Pain  Type Chronic pain   Pain Onset More than a month ago   Pain Frequency Intermittent   Aggravating Factors  unsure   Pain Relieving Factors medication             [facility] Adult PT Treatment/Exercise - 01/01/16 1423    Transfers   Transfers Sit to Stand;Stand to Sit   Sit to Stand 6: Modified independent (Device/Increase time);With upper extremity assist;With armrests;From chair/3-in-1   Stand to Sit 6: Modified independent (Device/Increase time);With upper extremity assist;With armrests;To chair/3-in-1   Comments 5 reps x 2 with cues for forward lean, controlled descent and preventing leaning back against mat/chair   Ambulation/Gait   Ambulation/Gait Yes    Ambulation/Gait Assistance 5: Supervision   Ambulation Distance (Feet) 100 Feet  three times   Assistive device 4-wheeled walker   Gait Pattern Step-through pattern;Trunk flexed;Narrow base of support;Trendelenburg   Posture/Postural Control   Posture Comments standing  at wall for posture x 1 minute with UE support of RW   High Level Balance   High Level Balance Activities Side stepping   Knee/Hip Exercises: Standing   Other Standing Knee Exercises standing in parallel bars for bil hip flexion, hip extension, hip abduction with bil UE support.  Standing for upright posture with alternating UE lifts           PWR [facility]) - 01/01/16 1425    PWR! exercises Moves in sitting   PWR! Up 10   PWR! Rock 10   PWR! Twist 10  modified as axial rotation   PWR! Step 10   Comments in sitting.  Also 10 reps bil marching for step             PT Education - 01/01/16 1427    Education provided Yes   Education Details continuing HEP and returning to previous workout regimine with trainer at gym, importance of exercises   Person(s) Educated Patient   Methods Explanation;Demonstration   Comprehension Verbalized understanding             PT Long Term Goals - 01/01/16 1428    PT LONG TERM GOAL #1   Title Pt will be independent with progression of HEP to address balance, posture, strength, flexibility.  TARGET 01/16/16   Time 4   Period Weeks   Status Achieved   PT LONG TERM GOAL #2   Title Pt will verbalize plans for continued community fitness upon D/C from PT.  TARGET 01/16/16   Time 4   Period Weeks   Status Achieved   PT LONG TERM GOAL #3   Title Pt will report reduction in pain by at least 2 points by end of PT session, in order to improve functional mobility with decreased pain.  TARGET 01/16/16   Time 4   Period Weeks   Status Partially Met  pt reports pain does increase at times and varies day to day but unsure of cause               Plan - 01/01/16 1431     Clinical Impression Statement Pt met goals 1 and 2.  Goal 3 partially met as pt reports increased pain at times.  Pt reports feeling ready to return to prior work out routine with trainer.  Discharge from PT per POC.   Pt will benefit from skilled therapeutic intervention in order to improve on the following deficits Abnormal gait;Decreased balance;Decreased mobility;Decreased strength;Difficulty walking;Postural dysfunction   Rehab Potential Good   PT Frequency 1x /  week   PT Duration 4 weeks  per recert 44/01/02   PT Treatment/Interventions ADLs/Self Care Home Management;Therapeutic exercise;Therapeutic activities;Functional mobility training;Gait training;Balance training;Neuromuscular re-education;Patient/family education   PT Next Visit Plan Discharge PT.   Consulted and Agree with Plan of Care Patient          G-Codes - 01-26-16 1433    Functional Assessment Tool Used BERG 28/56, TUG 17.5 seconds with rollator, Gait velocity 2.76 ft/second with Rollator      Problem List Patient Active Problem List   Diagnosis Date Noted  . Spinal stenosis of lumbar region 07/15/2015  . Tremors of nervous system 01/29/2015  . Bradycardia 11/07/2014  . Colon polyp 10/17/2014  . Mobitz type 1 second degree atrioventricular block   . Heme + stool 10/15/2014  . Symptomatic anemia 10/14/2014  . Dyspnea on exertion 10/14/2014  . Unsteady gait 10/02/2014  . Chronic systolic CHF (congestive heart failure) ([place]) 05/12/2014  . Orthostatic lightheadedness 03/19/2013  . First degree AV block 07/25/2012  . Atherosclerosis of native arteries of the extremities with ulceration ([place]) 02/17/2012  . HYPOTENSION, UNSPECIFIED 10/08/2010  . SECOND DEGREE AV HEART BLOCK, MOBITZ I 09/04/2010  . S/P AVR 09/01/2010  . CAD, NATIVE VESSEL 06/24/2010  . Diabetes mellitus without complication ([place]) 72/53/6644  . CAD (coronary artery disease) of artery bypass graft 02/16/2010  . OBSTRUCTIVE SLEEP APNEA 07/21/2009   . Hypothyroidism 12/07/2007  . HTN (hypertension) 12/07/2007  . Iron deficiency anemia 12/07/2007  . ELEVATED PROSTATE SPECIFIC ANTIGEN 12/07/2007    Narda Bonds 01-26-2016, 3:09 PM  [facility] [address] [place], [place], [address] Phone: [phone]   Fax:  [phone]  Name: Stanley [name] MRN: [id number] Date of Birth: Feb 24, 1925    Narda Bonds, [place] 2016/01/26 3:09 PM Phone: [phone] Fax: [phone]

## 2016-01-05 ENCOUNTER — Telehealth: Payer: Self-pay | Admitting: *Deleted

## 2016-01-05 ENCOUNTER — Other Ambulatory Visit: Payer: Self-pay | Admitting: *Deleted

## 2016-01-05 MED ORDER — SITAGLIPTIN PHOS-METFORMIN HCL 50-1000 MG PO TABS: ORAL_TABLET | ORAL | Status: DC

## 2016-01-05 MED ORDER — CEPHALEXIN 500 MG PO CAPS: ORAL_CAPSULE | ORAL | Status: DC

## 2016-01-05 NOTE — Telephone Encounter (Signed)
I sent script e-scribe. 

## 2016-01-05 NOTE — Telephone Encounter (Signed)
Refill request for Cephalexin 500 mg capsule #120 no refills. Since is antibiotic, please advise if wish to refill?    Last seen: 11/24/15 Initial order: 11/21/15 #120 no refills.

## 2016-01-05 NOTE — Telephone Encounter (Signed)
Refill request for Cephalexin 500 mg capsule #120 no refills  Last seen:

## 2016-01-05 NOTE — Telephone Encounter (Signed)
Refill Keflex for another #120 with no rf

## 2016-01-08 ENCOUNTER — Encounter: Payer: Self-pay | Admitting: Physical Therapy

## 2016-01-08 ENCOUNTER — Encounter: Payer: Self-pay | Admitting: Pulmonary Disease

## 2016-01-08 NOTE — Therapy (Signed)
[facility] [address] [place], [place], [address] Phone: [phone]   Fax:  [phone]  Patient Details  Name: Stanley [name] MRN: [id number] Date of Birth: 1925/02/10 Referring Provider:  No ref. provider found  Encounter Date: 01/08/2016  PHYSICAL THERAPY DISCHARGE SUMMARY  Visits from Start of Care: 16  Current functional level related to goals / functional outcomes:     PT Long Term Goals - 01/01/16 1428    PT LONG TERM GOAL #1   Title Pt will be independent with progression of HEP to address balance, posture, strength, flexibility.  TARGET 01/16/16   Time 4   Period Weeks   Status Achieved   PT LONG TERM GOAL #2   Title Pt will verbalize plans for continued community fitness upon D/C from PT.  TARGET 01/16/16   Time 4   Period Weeks   Status Achieved   PT LONG TERM GOAL #3   Title Pt will report reduction in pain by at least 2 points by end of PT session, in order to improve functional mobility with decreased pain.  TARGET 01/16/16   Time 4   Period Weeks   Status Partially Met  pt reports pain does increase at times and varies day to day but unsure of cause    Pt does now have solid HEP to address balance, posture and flexibility to offset Parkinson's disease deficits.   Remaining deficits: Pt has ben limited/slowed by hip and back pain.   Education / Equipment: HEP, plans for community fitness  Plan: Patient agrees to discharge.  Patient goals were partially met. Patient is being discharged due to being pleased with the current functional level.  ?????      Aneli Zara W. 01/08/2016, 11:21 AM  Frazier Butt., PT  [facility] [address] [place], [place], [address] Phone: [phone]   Fax:  [phone]

## 2016-01-12 ENCOUNTER — Other Ambulatory Visit: Payer: Self-pay | Admitting: Internal Medicine

## 2016-01-26 ENCOUNTER — Other Ambulatory Visit: Payer: Self-pay

## 2016-01-26 MED ORDER — PROCHLORPERAZINE MALEATE 10 MG PO TABS: ORAL_TABLET | ORAL | Status: DC

## 2016-01-26 NOTE — Telephone Encounter (Signed)
Patient has requested refill on Prochlorperazine. Last refilled 12/04/14. Patient was last seen on 11/24/15.

## 2016-01-28 ENCOUNTER — Other Ambulatory Visit (HOSPITAL_BASED_OUTPATIENT_CLINIC_OR_DEPARTMENT_OTHER): Payer: Medicare Other

## 2016-01-28 ENCOUNTER — Telehealth: Payer: Self-pay | Admitting: Family Medicine

## 2016-01-28 ENCOUNTER — Telehealth: Payer: Self-pay | Admitting: Internal Medicine

## 2016-01-28 DIAGNOSIS — D509 Iron deficiency anemia, unspecified: Secondary | ICD-10-CM

## 2016-01-28 LAB — CBC WITH DIFFERENTIAL/PLATELET
BASO%: 0.4 % (ref 0.0–2.0)
BASOS ABS: 0 10*3/uL (ref 0.0–0.1)
EOS%: 2.6 % (ref 0.0–7.0)
Eosinophils Absolute: 0.2 10*3/uL (ref 0.0–0.5)
HCT: 33.3 % — ABNORMAL LOW (ref 38.4–49.9)
HEMOGLOBIN: 10.8 g/dL — AB (ref 13.0–17.1)
LYMPH%: 20.1 % (ref 14.0–49.0)
MCH: 31.1 pg (ref 27.2–33.4)
MCHC: 32.4 g/dL (ref 32.0–36.0)
MCV: 96 fL (ref 79.3–98.0)
MONO#: 1.2 10*3/uL — ABNORMAL HIGH (ref 0.1–0.9)
MONO%: 15.2 % — AB (ref 0.0–14.0)
NEUT#: 4.8 10*3/uL (ref 1.5–6.5)
NEUT%: 61.7 % (ref 39.0–75.0)
Platelets: 200 10*3/uL (ref 140–400)
RBC: 3.47 10*6/uL — ABNORMAL LOW (ref 4.20–5.82)
RDW: 14.2 % (ref 11.0–14.6)
WBC: 7.8 10*3/uL (ref 4.0–10.3)
lymph#: 1.6 10*3/uL (ref 0.9–3.3)

## 2016-01-28 LAB — IRON AND TIBC
%SAT: 18 % — ABNORMAL LOW (ref 20–55)
Iron: 55 ug/dL (ref 42–163)
TIBC: 306 ug/dL (ref 202–409)
UIBC: 252 ug/dL (ref 117–376)

## 2016-01-28 LAB — FERRITIN: Ferritin: 110 ng/ml (ref 22–316)

## 2016-01-28 MED ORDER — PIOGLITAZONE HCL 45 MG PO TABS: 45.0000 mg | ORAL_TABLET | Freq: Every day | ORAL | Status: DC

## 2016-01-28 NOTE — Telephone Encounter (Signed)
pt requested to be sch while here today for labs-scheduled

## 2016-01-28 NOTE — Telephone Encounter (Signed)
Refill request for Actos send to [facility] mail order and call in a 30 day supply to [facility], pt is almost out of medication.

## 2016-01-28 NOTE — Telephone Encounter (Signed)
This was done and I spoke with pt.

## 2016-01-29 ENCOUNTER — Telehealth: Payer: Self-pay

## 2016-01-29 MED ORDER — PANTOPRAZOLE SODIUM 40 MG PO TBEC: 40.0000 mg | DELAYED_RELEASE_TABLET | Freq: Two times a day (BID) | ORAL | Status: AC

## 2016-01-29 MED ORDER — PROCHLORPERAZINE MALEATE 10 MG PO TABS: ORAL_TABLET | ORAL | Status: DC

## 2016-01-29 NOTE — Telephone Encounter (Signed)
-----   Message from Alfredia Ferguson, PA-C sent at 01/29/2016  2:35 PM EST ----- Regarding: med refill Dr [name] called me today .Marland Kitchenneeds refills on protonix 40 mg po BID  #60 /11 refills.Hoyt Koch... Fuller Plan pt

## 2016-01-29 NOTE — Telephone Encounter (Signed)
rx sent

## 2016-01-29 NOTE — Addendum Note (Signed)
Addended by: Colleen Can on: 01/29/2016 12:02 PM   Modules accepted: Orders

## 2016-02-02 ENCOUNTER — Other Ambulatory Visit: Payer: Medicare Other

## 2016-02-09 ENCOUNTER — Telehealth: Payer: Self-pay | Admitting: Internal Medicine

## 2016-02-09 ENCOUNTER — Ambulatory Visit (HOSPITAL_BASED_OUTPATIENT_CLINIC_OR_DEPARTMENT_OTHER): Payer: Medicare Other | Admitting: Internal Medicine

## 2016-02-09 ENCOUNTER — Encounter: Payer: Self-pay | Admitting: Internal Medicine

## 2016-02-09 VITALS — BP 142/40 | HR 49 | Temp 98.4°F | Resp 18 | Ht 67.0 in | Wt 185.0 lb

## 2016-02-09 DIAGNOSIS — D509 Iron deficiency anemia, unspecified: Secondary | ICD-10-CM

## 2016-02-09 NOTE — Progress Notes (Signed)
[facility] [phone]   [phone]  OFFICE PROGRESS NOTE  Stanley Morale, MD [place] [address]  DIAGNOSIS: Iron deficiency anemia.  PRIOR THERAPY: Feraheme 510 mg IV weekly x 2, last dose was given on 10/18/2014.  CURRENT THERAPY: Integra plus 1 capsule by mouth daily.  INTERVAL HISTORY: Stanley [name] 82 y.o. male returns to the clinic today for follow-up visit. The patient is feeling fine with no specific complaints. The patient is currently on Integra plus 1 capsule by mouth daily and also tolerating it well. Has no significant change since his last visit. He continues to have mild fatigue. He denied having any significant chest pain, shortness of breath, cough or hemoptysis. He denied having any disease status. He has no bleeding issues. He had repeat CBC and iron study performed recently and he is here for evaluation and discussion of his lab results.  MEDICAL HISTORY: Past Medical History  Diagnosis Date  . Diabetes mellitus   . BPH (benign prostatic hyperplasia)   . Arthritis   . GERD (gastroesophageal reflux disease)   . Hypertension     pt denies  . Shortness of breath     uses cpap  . Peripheral vascular disease ([place]) 03-06-12    aortogram showed severe bilateral unreconstructible tibial artery disease  . Failed total knee, right ([place])   . H pylori ulcer     Gastric ulcer  . CAD (coronary artery disease)  s/p CABG   . Aortic stenosis     S/p pericardial valve  . Anemia   . Mobitz type 1 second degree atrioventricular block   . Shingles 05-12-12    ALLERGIES:  is allergic to morphine and related; succinylcholine; and ace inhibitors.  MEDICATIONS:  Current Outpatient Prescriptions  Medication Sig Dispense Refill  . ALPRAZolam (XANAX) 0.25 MG tablet take 1 tablet by mouth every 6 hours if needed 120 tablet 5  . aspirin 81 MG tablet Take 81 mg by mouth daily.    . Calcium Carbonate-Vitamin D  (CALCIUM-VITAMIN D) 500-200 MG-UNIT per tablet Take 1 tablet by mouth 2 (two) times daily.     . carbidopa-levodopa (SINEMET) 25-100 MG tablet 2 in the AM, 1 in the afternoon, 2 in the evening before meals 450 tablet 3  . cephALEXin (KEFLEX) 500 MG capsule take 1 capsule by mouth four times a day 120 capsule 0  . diclofenac (VOLTAREN) 75 MG EC tablet TAKE 1 TABLET TWICE A DAY 180 tablet 1  . diphenoxylate-atropine (LOMOTIL) 2.5-0.025 MG per tablet TAKE 1 TABLET 4 TIMES DAILY AS NEEDED FOR DIARRHEA OR LOOSE STOOLS. (Patient not taking: Reported on 11/24/2015) 60 tablet 3  . FeFum-FePoly-FA-B Cmp-C-Biot (INTEGRA PLUS) CAPS TAKE 1 CAPSULE EVERY MORNING 90 capsule 3  . finasteride (PROSCAR) 5 MG tablet Take 5 mg by mouth daily.  1  . glucose blood (ONE TOUCH TEST STRIPS) test strip Dispense one touch ultra, test TID and diagnosis code is E11.9 100 each 11  . HYDROcodone-acetaminophen (NORCO) 10-325 MG tablet Take 1 tablet by mouth every 6 (six) hours as needed for severe pain. 120 tablet 0  . Lancets (ONETOUCH ULTRASOFT) lancets Test once per day and diagnosis code is E 11.9 100 each 1  . mirabegron ER (MYRBETRIQ) 50 MG TB24 tablet Take 50 mg by mouth daily.    . Multiple Vitamin (MULITIVITAMIN WITH MINERALS) TABS Take 1 tablet by mouth daily.    . pantoprazole (PROTONIX) 40 MG  tablet Take 1 tablet (40 mg total) by mouth 2 (two) times daily. 60 tablet 11  . pioglitazone (ACTOS) 45 MG tablet Take 1 tablet (45 mg total) by mouth daily. 90 tablet 1  . prochlorperazine (COMPAZINE) 10 MG tablet take 1 tablet by mouth four times a day as needed for nausea 30 tablet 6  . rosuvastatin (CRESTOR) 10 MG tablet Take 1 tablet (10 mg total) by mouth daily. 90 tablet 3  . sitaGLIPtin-metformin (JANUMET) 50-1000 MG tablet take 1 tablet by mouth twice a day WITH A MEAL. 60 tablet 1  . tamsulosin (FLOMAX) 0.4 MG CAPS capsule Take 0.4 mg by mouth daily after breakfast.      No current facility-administered medications  for this visit.    SURGICAL HISTORY:  Past Surgical History  Procedure Laterality Date  . Aortic valve replacement    . Foot arthrotomy Right   . Tonsillectomy    . Cataract extraction, bilateral    . Humerus fracture surgery    . Replacement total knee Bilateral   . Toe amputation      rt 2nd  . Cardiac catheterization    . Amputation  01/19/2012    Procedure: AMPUTATION DIGIT;  Surgeon: Colin Rhein, MD;  Location: [facility];  Service: Orthopedics;  Laterality: Right;  right great toe amputation through MTP joint  . Rotator cuff repair Right   . Enteroscopy N/A 10/15/2014    Procedure: ENTEROSCOPY;  Surgeon: Gatha Mayer, MD;  Location: [facility];  Service: Endoscopy;  Laterality: N/A;  . Colonoscopy with propofol N/A 10/17/2014    Procedure: COLONOSCOPY WITH PROPOFOL;  Surgeon: Gatha Mayer, MD;  Location: [facility];  Service: Endoscopy;  Laterality: N/A;  . Abdominal aortagram N/A 02/25/2012    Procedure: ABDOMINAL AORTAGRAM;  Surgeon: Elam Dutch, MD;  Location: [facility] CATH LAB;  Service: Cardiovascular;  Laterality: N/A;    REVIEW OF SYSTEMS:  A comprehensive review of systems was negative except for: Constitutional: positive for fatigue   PHYSICAL EXAMINATION: General appearance: alert, cooperative, fatigued and no distress Head: Normocephalic, without obvious abnormality, atraumatic Neck: no adenopathy, no JVD, supple, symmetrical, trachea midline and thyroid not enlarged, symmetric, no tenderness/mass/nodules Lymph nodes: Cervical, supraclavicular, and axillary nodes normal. Resp: clear to auscultation bilaterally Back: symmetric, no curvature. ROM normal. No CVA tenderness. Cardio: Normal S1 and S2 with systolic murmur GI: soft, non-tender; bowel sounds normal; no masses,  no organomegaly Extremities: extremities normal, atraumatic, no cyanosis or edema  ECOG PERFORMANCE STATUS: 1 - Symptomatic but completely ambulatory  There were no vitals  taken for this visit.  LABORATORY DATA: Lab Results  Component Value Date   WBC 7.8 01/28/2016   HGB 10.8* 01/28/2016   HCT 33.3* 01/28/2016   MCV 96.0 01/28/2016   PLT 200 01/28/2016      Chemistry      Component Value Date/Time   NA 133* 06/12/2015 1408   NA 130* 10/09/2014 1123   K 4.8 06/12/2015 1408   K 5.3* 10/09/2014 1123   CL 102 06/12/2015 1408   CO2 23 06/12/2015 1408   CO2 21* 10/09/2014 1123   BUN 39* 06/12/2015 1408   BUN 31.2* 10/09/2014 1123   CREATININE 1.20 06/12/2015 1408   CREATININE 1.4* 10/09/2014 1123      Component Value Date/Time   CALCIUM 9.2 06/12/2015 1408   CALCIUM 9.3 10/09/2014 1123   ALKPHOS 73 10/14/2014 1456   ALKPHOS 70 10/09/2014 1123   AST 18 10/14/2014 1456  AST 17 10/09/2014 1123   ALT 16 10/14/2014 1456   ALT 15 10/09/2014 1123   BILITOT <0.2* 10/14/2014 1456   BILITOT 0.22 10/09/2014 1123     Other lab results: Ferritin  110, serum iron  55, total iron binding capacity 306 and iron saturation  18%.  RADIOGRAPHIC STUDIES: No results found.  ASSESSMENT AND PLAN: This is a very pleasant 82 years old white male with iron deficiency anemia status post Feraheme infusion and currently on Integra plus 1 capsule by mouth daily and tolerating it fairly well. The patient is feeling well today with no specific complaints except for mild fatigue. His CBC today showed persistent mild anemia with no significant iron deficiency. I discussed the lab result with the patient today and recommended for him to continue on Integra plus for now. I will see him back for follow-up visit in 3 months with repeat CBC, iron study and ferritin. He was advised to call immediately if he has any concerning symptoms in the interval. The patient voices understanding of current disease status and treatment options and is in agreement with the current care plan.  All questions were answered. The patient knows to call the clinic with any problems, questions or  concerns. We can certainly see the patient much sooner if necessary.  Disclaimer: This note was dictated with voice recognition software. Similar sounding words can inadvertently be transcribed and may not be corrected upon review.

## 2016-02-09 NOTE — Telephone Encounter (Signed)
Pt confirmed labs/ov per 02/20 POF, gave pt AVS and Calendar... KJ °

## 2016-02-11 ENCOUNTER — Ambulatory Visit: Payer: Medicare Other | Admitting: Neurology

## 2016-02-12 ENCOUNTER — Ambulatory Visit: Payer: Medicare Other | Admitting: Neurology

## 2016-02-16 ENCOUNTER — Telehealth: Payer: Self-pay

## 2016-02-16 MED ORDER — HYDROCODONE-ACETAMINOPHEN 10-325 MG PO TABS: 1.0000 | ORAL_TABLET | Freq: Four times a day (QID) | ORAL | Status: DC | PRN

## 2016-02-16 NOTE — Telephone Encounter (Signed)
Pt needs a refill of hydrocodone.  Pt will pick up tomorrow.

## 2016-02-16 NOTE — Telephone Encounter (Signed)
done

## 2016-02-18 ENCOUNTER — Ambulatory Visit: Payer: Medicare Other | Admitting: Neurology

## 2016-02-23 ENCOUNTER — Telehealth: Payer: Self-pay | Admitting: *Deleted

## 2016-02-23 DIAGNOSIS — D649 Anemia, unspecified: Secondary | ICD-10-CM

## 2016-02-23 NOTE — Telephone Encounter (Signed)
Returned pt call, pt states he is very weak and his lab work has been going down each time. i think I need to talk with him about a transfusion. My Hgb was down to 10.4. I am very weak and unstable. Pt denies having any bleeding. Requested repeatedly to speak with MD directly. Discussed with pt MD was wiith a patient and I will give MD his concerns. Pt can be reached at [phone] Next f/u 5/23

## 2016-02-24 NOTE — Telephone Encounter (Signed)
MD returned call to pt. POF to scheduling.  1 unit RBC transfusion. Pt aware and to expect call from scheduling with appt date/time

## 2016-02-25 ENCOUNTER — Ambulatory Visit (INDEPENDENT_AMBULATORY_CARE_PROVIDER_SITE_OTHER): Payer: Medicare Other | Admitting: Family Medicine

## 2016-02-25 ENCOUNTER — Encounter: Payer: Self-pay | Admitting: Family Medicine

## 2016-02-25 ENCOUNTER — Telehealth: Payer: Self-pay | Admitting: Internal Medicine

## 2016-02-25 VITALS — BP 137/56 | HR 54 | Resp 14 | Ht 67.0 in | Wt 180.0 lb

## 2016-02-25 DIAGNOSIS — R0602 Shortness of breath: Secondary | ICD-10-CM | POA: Diagnosis not present

## 2016-02-25 DIAGNOSIS — R001 Bradycardia, unspecified: Secondary | ICD-10-CM

## 2016-02-25 DIAGNOSIS — I25810 Atherosclerosis of coronary artery bypass graft(s) without angina pectoris: Secondary | ICD-10-CM

## 2016-02-25 DIAGNOSIS — M4806 Spinal stenosis, lumbar region: Secondary | ICD-10-CM

## 2016-02-25 DIAGNOSIS — D509 Iron deficiency anemia, unspecified: Secondary | ICD-10-CM

## 2016-02-25 DIAGNOSIS — E119 Type 2 diabetes mellitus without complications: Secondary | ICD-10-CM | POA: Diagnosis not present

## 2016-02-25 DIAGNOSIS — M48061 Spinal stenosis, lumbar region without neurogenic claudication: Secondary | ICD-10-CM

## 2016-02-25 DIAGNOSIS — I10 Essential (primary) hypertension: Secondary | ICD-10-CM

## 2016-02-25 DIAGNOSIS — I5022 Chronic systolic (congestive) heart failure: Secondary | ICD-10-CM

## 2016-02-25 DIAGNOSIS — G20 Parkinson's disease: Secondary | ICD-10-CM

## 2016-02-25 LAB — HEMOGLOBIN A1C: Hgb A1c MFr Bld: 7.6 % — ABNORMAL HIGH (ref 4.6–6.5)

## 2016-02-25 LAB — HEPATIC FUNCTION PANEL
ALBUMIN: 4.2 g/dL (ref 3.5–5.2)
ALT: 4 U/L (ref 0–53)
AST: 18 U/L (ref 0–37)
Alkaline Phosphatase: 71 U/L (ref 39–117)
BILIRUBIN TOTAL: 0.5 mg/dL (ref 0.2–1.2)
Bilirubin, Direct: 0.1 mg/dL (ref 0.0–0.3)
TOTAL PROTEIN: 6.4 g/dL (ref 6.0–8.3)

## 2016-02-25 LAB — CBC WITH DIFFERENTIAL/PLATELET
BASOS PCT: 0.3 % (ref 0.0–3.0)
Basophils Absolute: 0 10*3/uL (ref 0.0–0.1)
EOS ABS: 0.2 10*3/uL (ref 0.0–0.7)
EOS PCT: 2.6 % (ref 0.0–5.0)
HCT: 32.3 % — ABNORMAL LOW (ref 39.0–52.0)
HEMOGLOBIN: 10.8 g/dL — AB (ref 13.0–17.0)
LYMPHS PCT: 18.6 % (ref 12.0–46.0)
Lymphs Abs: 1.6 10*3/uL (ref 0.7–4.0)
MCHC: 33.6 g/dL (ref 30.0–36.0)
MCV: 92.5 fl (ref 78.0–100.0)
Monocytes Absolute: 1 10*3/uL (ref 0.1–1.0)
Monocytes Relative: 11.6 % (ref 3.0–12.0)
Neutro Abs: 5.8 10*3/uL (ref 1.4–7.7)
Neutrophils Relative %: 66.9 % (ref 43.0–77.0)
Platelets: 207 10*3/uL (ref 150.0–400.0)
RBC: 3.49 Mil/uL — AB (ref 4.22–5.81)
RDW: 13.9 % (ref 11.5–15.5)
WBC: 8.6 10*3/uL (ref 4.0–10.5)

## 2016-02-25 LAB — LIPID PANEL
CHOLESTEROL: 141 mg/dL (ref 0–200)
HDL: 47 mg/dL (ref >39.00)
LDL Cholesterol: 71 mg/dL (ref 0–99)
NonHDL: 93.78
TRIGLYCERIDES: 115 mg/dL (ref 0.0–149.0)
Total CHOL/HDL Ratio: 3
VLDL: 23 mg/dL (ref 0.0–40.0)

## 2016-02-25 LAB — POC URINALSYSI DIPSTICK (AUTOMATED)
Bilirubin, UA: NEGATIVE
GLUCOSE UA: NEGATIVE
Ketones, UA: NEGATIVE
LEUKOCYTES UA: NEGATIVE
Nitrite, UA: NEGATIVE
RBC UA: NEGATIVE
Spec Grav, UA: 1.015
Urobilinogen, UA: 0.2
pH, UA: 7

## 2016-02-25 LAB — BASIC METABOLIC PANEL
BUN: 49 mg/dL — AB (ref 6–23)
CHLORIDE: 104 meq/L (ref 96–112)
CO2: 25 meq/L (ref 19–32)
CREATININE: 0.95 mg/dL (ref 0.40–1.50)
Calcium: 9.4 mg/dL (ref 8.4–10.5)
GFR: 79.46 mL/min (ref >60.00)
GLUCOSE: 148 mg/dL — AB (ref 70–99)
POTASSIUM: 4.8 meq/L (ref 3.5–5.1)
Sodium: 139 mEq/L (ref 135–145)

## 2016-02-25 LAB — TSH: TSH: 0.6 u[IU]/mL (ref 0.35–4.50)

## 2016-02-25 NOTE — Telephone Encounter (Signed)
s.w. pt and advised on lab change....pt ok and aware

## 2016-02-25 NOTE — Telephone Encounter (Signed)
s.w. pt and advised on march appt....pt ok and aware °

## 2016-02-25 NOTE — Progress Notes (Signed)
Subjective:    Patient ID: Stanley [name], male    DOB: 1925-04-27, 82 y.o.   MRN: [id number]  HPI 82 yr old male to follow up on multiple issues. He has been seeing Dr. Carles Collet for Parkinsons disease and he had an excellent response to carbidopa-levodopa at first, however feels like he has regressed a bit over the past few months. His gait is more unstable, his tremors are more pronounced, and his speech is more difficult. He says in general he has "really gone downhill" over the past 3 months. He feels a generalized weakness and fatigue, and he gets very SOB on exertion. Just walking the hallway in our clinic today from the lobby to the exam room he had to stop 3 times to catch his breath. He denies any chest pain or sweats or nausea. He has not seen Dr. Aundra Dubin in cardiology since last June. He denies coughing or wheezing. No lower extremity edema. His last EKG in June showed first degree AV block and incomplete RBBB with sinus rhythm. His BP has been stable at home but his heart rate is running low. His glucoses have been well managed in the range of 110 to 130 fasting. His appetite is greatly diminished but he takes 2-3 bottles of Boost every day. He sees Dr. Julien Nordmann for iron deficiency anemia, and his last Feraheme infusion was in October 2015. His recent Hgb a week ago was down to 10.8 from 12.6 last May. He has been fatigued in the past with Hgb readings below 11, and he thinks he needs another iron infusion now. He is seeing Dr. Sherwood Gambler for lumbar spinal stenosis and he is scheduled for more epidural steroid shots tomorrow. He works with a Physiological scientist twice a week at [employer], but he has been unable to do much the past few months. He also noticed a hernia about 6 months ago. He denies any pain with this.    Review of Systems  Constitutional: Positive for appetite change and fatigue. Negative for fever, chills and diaphoresis.  HENT: Negative.   Eyes: Negative.   Respiratory: Positive for  shortness of breath. Negative for apnea, cough, choking, chest tightness, wheezing and stridor.   Cardiovascular: Negative.   Gastrointestinal: Negative.   Genitourinary: Negative.   Musculoskeletal: Positive for back pain, arthralgias and gait problem. Negative for joint swelling, neck pain and neck stiffness.  Skin: Negative.   Neurological: Positive for tremors, speech difficulty and weakness. Negative for dizziness, seizures, syncope, facial asymmetry, light-headedness, numbness and headaches.  Psychiatric/Behavioral: Negative.        Objective:   Physical Exam  Constitutional: He is oriented to person, place, and time. He appears well-developed and well-nourished. No distress.  He is quite frail today and has trouble walking even with his walker. He cannot get up on the exam table even with our assistance.   HENT:  Head: Normocephalic and atraumatic.  Right Ear: External ear normal.  Left Ear: External ear normal.  Nose: Nose normal.  Mouth/Throat: Oropharynx is clear and moist. No oropharyngeal exudate.  Eyes: Conjunctivae and EOM are normal. Pupils are equal, round, and reactive to light. Right eye exhibits no discharge. Left eye exhibits no discharge. No scleral icterus.  Neck: Neck supple. No JVD present. No tracheal deviation present. No thyromegaly present.  Cardiovascular: Regular rhythm, normal heart sounds and intact distal pulses.  Exam reveals no gallop and no friction rub.   No murmur heard. Slow HR   Pulmonary/Chest: Effort normal  and breath sounds normal. No respiratory distress. He has no wheezes. He has no rales. He exhibits no tenderness.  Abdominal: Soft. Bowel sounds are normal. He exhibits no distension and no mass. There is no tenderness. There is no rebound and no guarding.  Genitourinary:  There is a large mildly tender non-reducible left indirect inguinal hernia that fills the left scrotum   Musculoskeletal: Normal range of motion. He exhibits no edema or  tenderness.  Lymphadenopathy:    He has no cervical adenopathy.  Neurological: He is alert and oriented to person, place, and time. No cranial nerve deficit.  Skin: Skin is warm and dry. No rash noted. He is not diaphoretic. No erythema. No pallor.  Psychiatric: He has a normal mood and affect. His behavior is normal. Judgment and thought content normal.          Assessment & Plan:  This is a frail gentleman who has declined significantly since I last saw him, and there are a number of factors playing a role in this. We will get fasting labs today to check his diabetes, etc. He is anemic again, and I will contact Dr. Julien Nordmann about the possibility of getting another Feraheme infusion. He is significantly bradycardic and no doubt this is a large part of why he is SOB on exertion. We will have him see Dr. Aundra Dubin ASAP. We also discussed his ability to drive, and I told him today that I do not think he is able to safely operate a motor vehicle in his present state. He agreed to stop driving for now until we can evaluate his overall health more fully.

## 2016-02-25 NOTE — Progress Notes (Signed)
Pre visit review using our clinic review tool, if applicable. No additional management support is needed unless otherwise documented below in the visit note. 

## 2016-02-26 NOTE — Telephone Encounter (Signed)
Pt picked up rx

## 2016-02-27 ENCOUNTER — Ambulatory Visit (HOSPITAL_COMMUNITY)
Admission: RE | Admit: 2016-02-27 | Discharge: 2016-02-27 | Disposition: A | Discharge status: Home / Self Care | Payer: Medicare Other | Source: Ambulatory Visit | Attending: Internal Medicine | Admitting: Internal Medicine

## 2016-02-27 ENCOUNTER — Other Ambulatory Visit (HOSPITAL_BASED_OUTPATIENT_CLINIC_OR_DEPARTMENT_OTHER): Payer: Medicare Other

## 2016-02-27 ENCOUNTER — Encounter: Payer: Self-pay | Admitting: Cardiovascular Disease

## 2016-02-27 ENCOUNTER — Ambulatory Visit (INDEPENDENT_AMBULATORY_CARE_PROVIDER_SITE_OTHER): Payer: Medicare Other | Admitting: Neurology

## 2016-02-27 ENCOUNTER — Ambulatory Visit (INDEPENDENT_AMBULATORY_CARE_PROVIDER_SITE_OTHER): Payer: Medicare Other | Admitting: Cardiovascular Disease

## 2016-02-27 ENCOUNTER — Other Ambulatory Visit: Payer: Medicare Other

## 2016-02-27 ENCOUNTER — Telehealth: Payer: Self-pay

## 2016-02-27 ENCOUNTER — Encounter: Payer: Self-pay | Admitting: Neurology

## 2016-02-27 ENCOUNTER — Other Ambulatory Visit: Payer: Self-pay | Admitting: *Deleted

## 2016-02-27 ENCOUNTER — Ambulatory Visit (HOSPITAL_BASED_OUTPATIENT_CLINIC_OR_DEPARTMENT_OTHER): Payer: Medicare Other

## 2016-02-27 VITALS — BP 150/39 | HR 56 | Temp 98.1°F | Resp 17

## 2016-02-27 VITALS — BP 148/70 | HR 55 | Ht 68.0 in | Wt 182.0 lb

## 2016-02-27 VITALS — BP 138/60 | HR 63 | Ht 67.0 in | Wt 182.1 lb

## 2016-02-27 DIAGNOSIS — D649 Anemia, unspecified: Secondary | ICD-10-CM

## 2016-02-27 DIAGNOSIS — I25810 Atherosclerosis of coronary artery bypass graft(s) without angina pectoris: Secondary | ICD-10-CM

## 2016-02-27 DIAGNOSIS — D6489 Other specified anemias: Secondary | ICD-10-CM

## 2016-02-27 DIAGNOSIS — R55 Syncope and collapse: Secondary | ICD-10-CM

## 2016-02-27 DIAGNOSIS — I5022 Chronic systolic (congestive) heart failure: Secondary | ICD-10-CM

## 2016-02-27 DIAGNOSIS — G4700 Insomnia, unspecified: Secondary | ICD-10-CM

## 2016-02-27 DIAGNOSIS — G20 Parkinson's disease: Secondary | ICD-10-CM | POA: Diagnosis not present

## 2016-02-27 DIAGNOSIS — I10 Essential (primary) hypertension: Secondary | ICD-10-CM | POA: Diagnosis not present

## 2016-02-27 DIAGNOSIS — D509 Iron deficiency anemia, unspecified: Secondary | ICD-10-CM | POA: Diagnosis not present

## 2016-02-27 DIAGNOSIS — R001 Bradycardia, unspecified: Secondary | ICD-10-CM

## 2016-02-27 LAB — CBC WITH DIFFERENTIAL/PLATELET
BASO%: 0.4 % (ref 0.0–2.0)
BASOS ABS: 0 10*3/uL (ref 0.0–0.1)
EOS ABS: 0 10*3/uL (ref 0.0–0.5)
EOS%: 0.1 % (ref 0.0–7.0)
HCT: 33.4 % — ABNORMAL LOW (ref 38.4–49.9)
HGB: 10.9 g/dL — ABNORMAL LOW (ref 13.0–17.1)
LYMPH%: 12.1 % — AB (ref 14.0–49.0)
MCH: 30.7 pg (ref 27.2–33.4)
MCHC: 32.5 g/dL (ref 32.0–36.0)
MCV: 94.4 fL (ref 79.3–98.0)
MONO#: 0.7 10*3/uL (ref 0.1–0.9)
MONO%: 7.3 % (ref 0.0–14.0)
NEUT#: 7.3 10*3/uL — ABNORMAL HIGH (ref 1.5–6.5)
NEUT%: 80.1 % — ABNORMAL HIGH (ref 39.0–75.0)
Platelets: 202 10*3/uL (ref 140–400)
RBC: 3.54 10*6/uL — AB (ref 4.20–5.82)
RDW: 14 % (ref 11.0–14.6)
WBC: 9.1 10*3/uL (ref 4.0–10.3)
lymph#: 1.1 10*3/uL (ref 0.9–3.3)

## 2016-02-27 LAB — PREPARE RBC (CROSSMATCH)

## 2016-02-27 MED ORDER — ACETAMINOPHEN 325 MG PO TABS
ORAL_TABLET | ORAL | Status: AC
  Filled 2016-02-27: qty 2

## 2016-02-27 MED ORDER — SODIUM CHLORIDE 0.9 % IV SOLN
250.0000 mL | Freq: Once | INTRAVENOUS | Status: AC
  Administered 2016-02-27: 250 mL via INTRAVENOUS

## 2016-02-27 MED ORDER — DIPHENHYDRAMINE HCL 25 MG PO CAPS
25.0000 mg | ORAL_CAPSULE | Freq: Once | ORAL | Status: AC
  Administered 2016-02-27: 25 mg via ORAL

## 2016-02-27 MED ORDER — DIPHENHYDRAMINE HCL 25 MG PO CAPS
ORAL_CAPSULE | ORAL | Status: AC
  Filled 2016-02-27: qty 1

## 2016-02-27 MED ORDER — ACETAMINOPHEN 325 MG PO TABS
650.0000 mg | ORAL_TABLET | Freq: Once | ORAL | Status: AC
  Administered 2016-02-27: 650 mg via ORAL

## 2016-02-27 NOTE — Telephone Encounter (Signed)
Patient's wife called stating that patient may be late for 1130 lab appt.  He is presently with his cardiologist.  Patient is due to have a blood transfusion this afternoon.

## 2016-02-27 NOTE — Progress Notes (Signed)
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  The consultation is for the evaluation of tremor and to rule out Parkinson's disease.  I reviewed his primary care records that are available to me.  The patient noted a "intention" tremor that started in the L hand for years ago but over the last year he has noted tremor in the right hand at rest over the last year at rest.  He is right hand hand dominant. He denies head or jaw tremor but records state that he has this.  The patient was started on Mirapex in February, 2016 and was only able to get up to 0.25 at night and only took it for 2 nights and "it knocked me out."  11/11/15 update:  The patient is following up today.  I diagnosed him almost 2 months ago with Parkinson's disease.  I started him on levodopa and he is on carbidopa/levodopa 25/100 tid (8-9am/1pm/5-6pm).   He states that he has gotten better "but I'm not quite there."  Tremor is still present but never goes away.   He was having inner tremor but that is gone.   He states that he reached the point where he improved but isn't improving any more and he wanted to see if we could alter the medication.  R hand tremor and some tremor in the jaw.  No hallucinations.  No falls.  No lightheadedness or near syncope.   He has been attending physical therapy at the [facility].  02/27/16 update:  The patient follows up today regarding his Parkinson's disease.  Last visit, I increased his carbidopa/levodopa 25/100 and I told him that he could take either one and a half tablets 3 times per day, or if he had difficulty cutting the pills, he could take 2 tablets in the morning, one in the afternoon and 2 in the evening.  He ended up taking it this way.  He did complete his physical therapies on January 19.  He has one fall 2 weeks ago; went to get out of bed in middle of the night and fell back and just  had trouble getting up but he did pull himself up on the bench at the end of the bed.  He does worry about his speech and did not get ST as it was felt that he did not need it at the time.  He thinks he does now.    He is having more short of breath.  He did follow up with his primary care physician on 02/25/2016 and it was felt that his symptoms were likely multifactorial.  He has been fairly bradycardic (40 bpm on his visit on February 20 and 56 beats permitted on his visit on March 8) and his primary care doctor was trying to get him back in with cardiology as soon as possible.  He saw Dr. Acie Fredrickson today and it was felt that nothing further needs to be done currently.  He has been mildly anemic, and his primary care physician was also going to try to get him iron infusions.  Pt states that he actually insisted on blood transfusion and is scheduled for that later today.  Dr. Sarajane Jews had also told him to stop driving for now.  His wife does ask me about the Xanax that he is taking.  He reports that he really only takes 0.25 mg at night and then occasionally during  the day as needed.  However, his wife thinks that the "as needed" dosages are more frequent and he thinks.  He admits that he takes them when tremor increases.  Neuroimaging has not previously been performed.    PREVIOUS MEDICATIONS: Mirapex, 0.25 mg daily at bedtime - only took for 2 days   ALLERGIES:   Allergies  Allergen Reactions  . Morphine And Related Nausea And Vomiting  . Succinylcholine Other (See Comments)    unknown  . Ace Inhibitors Other (See Comments)    cough    CURRENT MEDICATIONS:  Outpatient Encounter Prescriptions as of 02/27/2016  Medication Sig  . ALPRAZolam (XANAX) 0.25 MG tablet take 1 tablet by mouth every 6 hours if needed  . aspirin 81 MG tablet Take 81 mg by mouth daily.  . Calcium Carbonate-Vitamin D (CALCIUM-VITAMIN D) 500-200 MG-UNIT per tablet Take 1 tablet by mouth 2 (two) times daily.   . carbidopa-levodopa  (SINEMET) 25-100 MG tablet 2 in the AM, 1 in the afternoon, 2 in the evening before meals  . diclofenac (VOLTAREN) 75 MG EC tablet TAKE 1 TABLET TWICE A DAY  . diphenoxylate-atropine (LOMOTIL) 2.5-0.025 MG per tablet TAKE 1 TABLET 4 TIMES DAILY AS NEEDED FOR DIARRHEA OR LOOSE STOOLS.  Marland Kitchen FeFum-FePoly-FA-B Cmp-C-Biot (INTEGRA PLUS) CAPS TAKE 1 CAPSULE EVERY MORNING  . finasteride (PROSCAR) 5 MG tablet Take 5 mg by mouth daily.  Marland Kitchen glucose blood (ONE TOUCH TEST STRIPS) test strip Dispense one touch ultra, test TID and diagnosis code is E11.9  . HYDROcodone-acetaminophen (NORCO) 10-325 MG tablet Take 1 tablet by mouth every 6 (six) hours as needed for severe pain.  . Lancets (ONETOUCH ULTRASOFT) lancets Test once per day and diagnosis code is E 11.9  . mirabegron ER (MYRBETRIQ) 50 MG TB24 tablet Take 50 mg by mouth daily.  . Multiple Vitamin (MULITIVITAMIN WITH MINERALS) TABS Take 1 tablet by mouth daily.  . pantoprazole (PROTONIX) 40 MG tablet Take 1 tablet (40 mg total) by mouth 2 (two) times daily.  . pioglitazone (ACTOS) 45 MG tablet Take 1 tablet (45 mg total) by mouth daily.  . prochlorperazine (COMPAZINE) 10 MG tablet take 1 tablet by mouth four times a day as needed for nausea  . rosuvastatin (CRESTOR) 10 MG tablet Take 1 tablet (10 mg total) by mouth daily.  . sitaGLIPtin-metformin (JANUMET) 50-1000 MG tablet take 1 tablet by mouth twice a day WITH A MEAL.  . tamsulosin (FLOMAX) 0.4 MG CAPS capsule Take 0.4 mg by mouth daily after breakfast.    No facility-administered encounter medications on file as of 02/27/2016.    PAST MEDICAL HISTORY:   Past Medical History  Diagnosis Date  . Diabetes mellitus   . BPH (benign prostatic hyperplasia)   . Arthritis   . GERD (gastroesophageal reflux disease)   . Hypertension     pt denies  . Shortness of breath     uses cpap  . Peripheral vascular disease ([place]) 03-06-12    aortogram showed severe bilateral unreconstructible tibial artery disease    . Failed total knee, right ([place])   . H pylori ulcer     Gastric ulcer  . CAD (coronary artery disease)  s/p CABG     sees Dr. Loralie Champagne   . Aortic stenosis     S/p pericardial valve  . Mobitz type 1 second degree atrioventricular block   . Shingles 05-12-12  . Lumbar spinal stenosis     sees Dr. Sherwood Gambler   . Anemia  sees Dr. Julien Nordmann     PAST SURGICAL HISTORY:   Past Surgical History  Procedure Laterality Date  . Aortic valve replacement    . Foot arthrotomy Right   . Tonsillectomy    . Cataract extraction, bilateral    . Humerus fracture surgery    . Replacement total knee Bilateral   . Toe amputation      rt 2nd  . Cardiac catheterization    . Amputation  01/19/2012    Procedure: AMPUTATION DIGIT;  Surgeon: Colin Rhein, MD;  Location: [place];  Service: Orthopedics;  Laterality: Right;  right great toe amputation through MTP joint  . Rotator cuff repair Right   . Enteroscopy N/A 10/15/2014    Procedure: ENTEROSCOPY;  Surgeon: Gatha Mayer, MD;  Location: [facility];  Service: Endoscopy;  Laterality: N/A;  . Colonoscopy with propofol N/A 10/17/2014    Procedure: COLONOSCOPY WITH PROPOFOL;  Surgeon: Gatha Mayer, MD;  Location: [facility];  Service: Endoscopy;  Laterality: N/A;  . Abdominal aortagram N/A 02/25/2012    Procedure: ABDOMINAL AORTAGRAM;  Surgeon: Elam Dutch, MD;  Location[facility] CATH LAB;  Service: Cardiovascular;  Laterality: N/A;    SOCIAL HISTORY:   Social History   Social History  . Marital Status: Married    Spouse Name: N/A  . Number of Children: N/A  . Years of Education: N/A   Occupational History  . retired     Engineer, drilling (primary care physician)   Social History Main Topics  . Smoking status: Never Smoker   . Smokeless tobacco: Never Used  . Alcohol Use: 4.2 oz/week    7 Glasses of wine per week     Comment: glass of wine or vodka/tonic per night  . Drug Use: No  . Sexual Activity: Not on file    Other Topics Concern  . Not on file   Social History Narrative   Married, retired primary care MD    + children          FAMILY HISTORY:   Family Status  Relation Status Death Age  . Mother Deceased     carotid artery aneurysm  . Father Deceased 85    unknown cause of death  . Brother Deceased     as child  . Sister Deceased 28    amyloid  . Child Alive     2 sons, healthy    ROS:  A complete 10 system review of systems was obtained and was unremarkable apart from what is mentioned above.  PHYSICAL EXAMINATION:    VITALS:   Filed Vitals:   02/27/16 1326  BP: 148/70  Pulse: 55  Height: 5\' 8"  (1.727 m)  Weight: 182 lb (82.555 kg)    GEN:  The patient appears stated age and is in NAD. HEENT:  Normocephalic, atraumatic.  The mucous membranes are moist. The superficial temporal arteries are without ropiness or tenderness. CV:  RRR Lungs:  CTAB Neck/HEME:  There are no carotid bruits bilaterally.  Neurological examination:  Orientation: The patient is alert and oriented x3.  Cranial nerves: There is good facial symmetry.  There is mild facial hypomimia.    Extraocular muscles are intact. The visual fields are full to confrontational testing. The speech is fluent and clear. Soft palate rises symmetrically and there is no tongue deviation. Hearing is intact to conversational tone. Sensation: Sensation is intact to light touch throughout Motor: Strength is 5/5 in the bilateral upper and lower extremities.  Shoulder shrug is equal and symmetric.  There is no pronator drift.   Movement examination: Tone: There is good tone in the upper and lower extremity today.  Abnormal movements: There is a bilateral UE resting tremor, L more than R but both increase with distraction procedures.  The left and right upper extremity tremor are independent of one another. Coordination:  There is decremation with RAM's, with virtually all forms of RAM's, , including alternating  supination and pronation of the forearm, hand opening and closing, finger taps, heel taps and toe taps. Gait and Station: The patient has difficulty arising out of a deep-seated chair without the use of the hands. The patient's stride length is decreased; he attempted to walk without the walker for a few steps, but is very unsteady.  With the walker, he actually does quite well.  He drags the right leg mildly.   ASSESSMENT/PLAN:  1.  Idiopathic Parkinson's disease.  The patient has tremor, bradykinesia, rigidity and postural instability.  -Continue carbidopa/levodopa 25/100, 2 tablets in the morning, one in the afternoon and 2 in the evening.  I told him he can take an extra as needed or tremor, rather than taking Xanax.  I told him I would prefer him getting off the Xanax altogether.  I also told him that he could split up the levodopa other ways throughout the day if that worked better for him.  Burnis Medin send a referral for speech therapy.  -Talked to him about safe, cardiovascular exercise, and particularly talked much about stationary bikes.  -I agree with Dr. Sarajane Jews that the patient's worsening of symptoms in terms of fatigue, lack of energy and shortness of breath are likely multifactorial and not related to Parkinson's disease.  I do think that bradycardia could play a role, although the cardiologist did not think this was the case, and I will certainly defer to his judgment.  Anemia may play a minor role as well.  He is scheduled to have a transfusion later today. 2.  Insomnia  -As above, I told the patient that I would prefer he did not take the Xanax.  We talked about melatonin and he was going to try that.  In the future, Remeron could also be an option and he and I talked about that. 3.  Diabetic peripheral neuropathy  -This will contribute to gait instability.  Safety discussed 4.  Follow up is anticipated in the next few months, sooner should new neurologic issues arise.  Much greater than 50%  of this visit was spent in counseling with the patient and the family.  Total face to face time:  40 min

## 2016-02-27 NOTE — Progress Notes (Signed)
Patient ID: [name] MD, male   DOB: [birth year], 82 y.o.   MRN: [id number] PCP: Dr. Sarajane Jews  Problem List 1. CAD / CABG 2011 - [place] 2. Ischemic cardiomyopathy 3. Anemia 4. PAD   82 yo with history of PAD, CABG/AVR, diabetes, ischemic cardiomyopathy, and iron deficiency anemia presents for cardiology followup.   He has severe, unrevascularizable bilateral tibial disease.  Legs are weak but he denies significant claudication-type symptoms. He was admitted to [facility] with symptomatic anemia (dyspnea) in 10/15.  He was transfused and had EGD with enteroscopy and colonoscopy.  These tests did not show a definite cause for blood loss. While in the hospital, he was noted to be bradycardic as low as the 30s while sleeping and to have episodes of type I 2nd degree AV block.  He was asymptomatic.  30 day monitor after discharge showed an episode of type I 2nd degree AV block but no more worrisome arrhythmias.   He is doing ok symptomatically.  He took a half-mile walk this weekend without dyspnea.  He uses a walker when he is outside the house for stability.  No chest pain.  No lightheadedness or syncope.  No orthopnea or PND.  Last echo showed improvement in EF to 45-50%.    February 27, 2016:  He was in the pain center getting an epidural . During the procedure, he was noted t have a HR in the 30's with 2:1 AV block. Has severe spinal stenosis.  Also has severe PVD.  Is scheduled for a transfusion this afternoon.    Denies any syncope or pre-syncope.    PMH: 1. PAD: peripheral angiography (3/13) showed severe, unreconstructable bilateral tibial artery disease.  He had a chronic ulcer on his right foot that has actually now healed.  He did lose 2 toes on his right foot.  2. Orthostatic hypotension 3. Carotid dopplers (3/14) with 0-39% bilateral stenosis.  4. OSA: Uses CPAP.  5. GERD 6. BPH 7. Type II diabetes 8. H/o HTN but not on meds for this now.  ACEI cough.  9. Anemia: Fe  deficiency.  10/15 admission with anemia.  EGD and enteroscopy were unremarkable colonoscopy showed a cecal polyp and diverticulosis.  10. CAD: CABG at time of AVR in 8/11 with LIMA-LAD and SVG-PDA.  11. Aortic stenosis: Bioprosthetic AVR in 8/11.  Pre-op echo in 3/11 showed EF 60-65% with severe AS.  Bioprosthetic aortic valve looked ok on 11/14 echo.  12. Hyperlipidemia: Myalgias with Crestor 20 mg daily but can tolerate 10 mg daily.  13. Ischemic cardiomyopathy: Echo (11/14) with EF 35-40%, mild LVH, bioprosthetic aortic valve with no significant AS or AI, mild MR, mildly dilated RV with mild to moderately decreased RV systolic function. Echo (10/15) with EF 45-50%, normal bioprosthetic aortic valve.  14. Low back pain 15. Bradycardia, Mobitz type I 2nd degree AV block.  Event monitor (11/15) showed 1 episode of type I 2nd degree AV block.   SH: Married, retired Engineer, drilling, occasional ETOH, no smoking.   FH: Type II diabetes  ROS: All systems reviewed and negative except as per HPI.   Current Outpatient Prescriptions  Medication Sig Dispense Refill  . ALPRAZolam (XANAX) 0.25 MG tablet take 1 tablet by mouth every 6 hours if needed 120 tablet 5  . aspirin 81 MG tablet Take 81 mg by mouth daily.    . Calcium Carbonate-Vitamin D (CALCIUM-VITAMIN D) 500-200 MG-UNIT per tablet Take 1 tablet by mouth 2 (two) times daily.     Marland Kitchen  carbidopa-levodopa (SINEMET) 25-100 MG tablet 2 in the AM, 1 in the afternoon, 2 in the evening before meals 450 tablet 3  . diclofenac (VOLTAREN) 75 MG EC tablet TAKE 1 TABLET TWICE A DAY 180 tablet 1  . diphenoxylate-atropine (LOMOTIL) 2.5-0.025 MG per tablet TAKE 1 TABLET 4 TIMES DAILY AS NEEDED FOR DIARRHEA OR LOOSE STOOLS. 60 tablet 3  . FeFum-FePoly-FA-B Cmp-C-Biot (INTEGRA PLUS) CAPS TAKE 1 CAPSULE EVERY MORNING 90 capsule 3  . finasteride (PROSCAR) 5 MG tablet Take 5 mg by mouth daily.  1  . glucose blood (ONE TOUCH TEST STRIPS) test strip Dispense one touch ultra,  test TID and diagnosis code is E11.9 100 each 11  . HYDROcodone-acetaminophen (NORCO) 10-325 MG tablet Take 1 tablet by mouth every 6 (six) hours as needed for severe pain. 120 tablet 0  . Lancets (ONETOUCH ULTRASOFT) lancets Test once per day and diagnosis code is E 11.9 100 each 1  . mirabegron ER (MYRBETRIQ) 50 MG TB24 tablet Take 50 mg by mouth daily.    . Multiple Vitamin (MULITIVITAMIN WITH MINERALS) TABS Take 1 tablet by mouth daily.    . pantoprazole (PROTONIX) 40 MG tablet Take 1 tablet (40 mg total) by mouth 2 (two) times daily. 60 tablet 11  . pioglitazone (ACTOS) 45 MG tablet Take 1 tablet (45 mg total) by mouth daily. 90 tablet 1  . prochlorperazine (COMPAZINE) 10 MG tablet take 1 tablet by mouth four times a day as needed for nausea 30 tablet 6  . rosuvastatin (CRESTOR) 10 MG tablet Take 1 tablet (10 mg total) by mouth daily. 90 tablet 3  . sitaGLIPtin-metformin (JANUMET) 50-1000 MG tablet take 1 tablet by mouth twice a day WITH A MEAL. 60 tablet 1  . tamsulosin (FLOMAX) 0.4 MG CAPS capsule Take 0.4 mg by mouth daily after breakfast.      No current facility-administered medications for this visit.    BP 138/60 mmHg  Pulse 63  Ht 5\' 7"  (1.702 m)  Wt 182 lb 1.9 oz (82.609 kg)  BMI 28.52 kg/m2 General: NAD Neck: No JVD, no thyromegaly or thyroid nodule.  Lungs: Clear to auscultation bilaterally with normal respiratory effort. CV: Nondisplaced PMI.  Heart regular S1/S2, no S3/S4, 2/6 early SEM RUSB.   No edema.  No carotid bruit.  I am unable to palpate pedal pulses.   Abdomen: Soft, nontender, no hepatosplenomegaly, no distention.  Skin: Intact without lesions or rashes.  Neurologic: Alert and oriented x 3.  Psych: Normal affect. Extremities: No clubbing or cyanosis.   ECG: 02/27/2016: Office rhythm with first-degree AV block. Heart rate is 63. Incomplete right bundle branch block.  Assessment/Plan:  1.  Bradycardia- He has a known history of Mobitz type I AV block. He  has a first degree AV block at baseline. I suspect that he had a vagal reaction with the epidural injection. This caused increased AV block. He is completely asymptomatic. He denies any episodes of syncope or presyncope.  Given that this is not a new diagnosis I don't think that we need to pursue any further studies. He's worn a monitor in the past. I have advised him to call me if he has any episodes of syncope or  Presyncope. I'll see him again in 3 months for follow-up office visit.   2. CAD: S/p CABG with AVR in 8/11.  No chest pain.    - Continue Crestor and ASA 81 mg daily.   3. Hyperlipidemia: LDL is acceptable on Crestor 10 mg  daily.  He was unable to tolerate 20 mg daily due to myalgias.    4. PAD: Right foot ulcer has healed.  The tibial system did not appear to be revascularizable on prior peripheral angiography. Legs are weak but he denies claudication.   5. Bioprosthetic aortic valve: 10/15 echo showed stable bioprosthetic valve.  6. Carotid stenosis: Mild on last dopplers.  Can consider followup dopplers in 2 years.  7. Chronic systolic CHF: EF [id number] on 11/14 echo but improved to 45-50% in 10/15.  Probably ischemic cardiomyopathy.  NYHA class II symptoms.  He is not volume overloaded on exam.  He will continue losartan, no beta blocker with bradycardia.   9. Anemia: Fe deficiency.  Possible small bowel AVM.  Recent workup in hospital was unrevealing.  HCT improved on most recent CBC.   Followup 3 months.   Nahser, Wonda Cheng 02/27/2016

## 2016-02-27 NOTE — Patient Instructions (Signed)
Medication Instructions:  Your physician recommends that you continue on your current medications as directed. Please refer to the Current Medication list given to you today.   Labwork: None Ordered   Testing/Procedures: None Ordered   Follow-Up: Your physician recommends that you schedule a follow-up appointment in: 3 months with Dr. Nahser   If you need a refill on your cardiac medications before your next appointment, please call your pharmacy.   Thank you for choosing [facility]! Michelle Swinyer, RN [phone]    

## 2016-02-27 NOTE — Patient Instructions (Signed)

## 2016-03-01 LAB — TYPE AND SCREEN
ABO/RH(D): O POS
ANTIBODY SCREEN: NEGATIVE
Unit division: 0

## 2016-03-02 ENCOUNTER — Ambulatory Visit: Payer: Medicare Other | Attending: Neurology

## 2016-03-02 ENCOUNTER — Ambulatory Visit: Payer: Medicare Other | Admitting: Cardiology

## 2016-03-02 DIAGNOSIS — R4701 Aphasia: Secondary | ICD-10-CM

## 2016-03-02 NOTE — Patient Instructions (Signed)
   WORK FOR TWENTY MINUTES, A COUPLE TIMES EACH DAY WITH YOUR WORD-FINDING TASKS AND THE CROSSWORDS

## 2016-03-02 NOTE — Therapy (Signed)
[facility] [address] [place], [place], [address] Phone: [phone]   Fax:  [phone]  Speech Language Pathology Evaluation  Patient Details  Name: Stanley MESQUITA MD MRN: [id number] Date of Birth: February 21, 1925 Referring Provider: Alonza Bogus, D.O.  Encounter Date: 03/02/2016      End of Session - 03/02/16 1612    Visit Number 1   Number of Visits 17   Date for SLP Re-Evaluation 05/03/16   SLP Start Time 1452   SLP Stop Time  1535   SLP Time Calculation (min) 43 min   Activity Tolerance Patient tolerated treatment well      Past Medical History  Diagnosis Date  . Diabetes mellitus   . BPH (benign prostatic hyperplasia)   . Arthritis   . GERD (gastroesophageal reflux disease)   . Hypertension     pt denies  . Shortness of breath     uses cpap  . Peripheral vascular disease ([place]) 03-06-12    aortogram showed severe bilateral unreconstructible tibial artery disease  . Failed total knee, right ([place])   . H pylori ulcer     Gastric ulcer  . CAD (coronary artery disease)  s/p CABG     sees Dr. Loralie Champagne   . Aortic stenosis     S/p pericardial valve  . Mobitz type 1 second degree atrioventricular block   . Shingles 05-12-12  . Lumbar spinal stenosis     sees Dr. Sherwood Gambler   . Anemia     sees Dr. Julien Nordmann     Past Surgical History  Procedure Laterality Date  . Aortic valve replacement    . Foot arthrotomy Right   . Tonsillectomy    . Cataract extraction, bilateral    . Humerus fracture surgery    . Replacement total knee Bilateral   . Toe amputation      rt 2nd  . Cardiac catheterization    . Amputation  01/19/2012    Procedure: AMPUTATION DIGIT;  Surgeon: Colin Rhein, MD;  Location: [facility];  Service: Orthopedics;  Laterality: Right;  right great toe amputation through MTP joint  . Rotator cuff repair Right   . Enteroscopy N/A 10/15/2014    Procedure: ENTEROSCOPY;   Surgeon: Gatha Mayer, MD;  [facility];  Service: Endoscopy;  Laterality: N/A;  . Colonoscopy with propofol N/A 10/17/2014    Procedure: COLONOSCOPY WITH PROPOFOL;  Surgeon: Gatha Mayer, MD;  [facility];  Service: Endoscopy;  Laterality: N/A;  . Abdominal aortagram N/A 02/25/2012    Procedure: ABDOMINAL AORTAGRAM;  Surgeon: Elam Dutch, MD;  Location: [facility] CATH LAB;  Service: Cardiovascular;  Laterality: N/A;    There were no vitals filed for this visit.  Visit Diagnosis: Expressive aphasia      Subjective Assessment - 03/02/16 1457    Subjective Pt reports hesitancy when he tries to talk, related to word finding. Pt reports wife agrees.   Currently in Pain? No/denies            SLP Evaluation [facility] - 03/02/16 1457    SLP Visit Information   SLP Received On 03/02/16   Referring Provider Tat, Wells Guiles, D.O.   Onset Date last three months   Medical Diagnosis Parkinson's disease    General Information   HPI Pt c/o word-finding/ sentence generation skills.   Prior Functional Status   Cognitive/Linguistic Baseline Within functional limits   Type of Home House    Lives  With Spouse   Vocation Retired   Charity fundraiser Status Within Functional Limits for tasks assessed   Auditory Comprehension   Overall Auditory Comprehension Appears within functional limits for tasks assessed   Verbal Expression   Overall Verbal Expression Impaired   Initiation Impaired  Pt with mild hesitant speech - verbal expression deficits    Naming Impairment   Confrontation Other (comment)  37/60 Boston Naming Test   Convergent --   Divergent Other (comment)  pt named 10 items in common category mod cues-90 seconds   Effective Techniques Phonemic cues   Other Verbal Expression Comments Phonemic cues were helpful for pt   Oral Motor/Sensory Function   Overall Oral Motor/Sensory Function To be tested    Motor Speech   Overall Motor Speech Impaired    Respiration Impaired   Level of Impairment Sentence   Phonation Low vocal intensity  short of breath- reduced breath support(?)   Intelligibility Intelligible   Standardized Assessments   Standardized Assessments  Boston Naming Test-2nd edition      Pt's speech was measured at average 69dB and pt demo'd dyspnea rarely during phonation, which negatively impacted volume.                      SLP Short Term Goals - 03-21-2016 1616    SLP SHORT TERM GOAL #1   Title pt will name 10 objects in simple categories in average 75 seconds   Time 4   Period Weeks   Status New   SLP SHORT TERM GOAL #2   Title pt will demo compensatory strategies for improved verbal expression in two-three sentence responses PRN 85% of the time   Time 4   Period Weeks   Status New   SLP SHORT TERM GOAL #3   Title pt will complete mod compelx naming tasks with occasional min A   Time 4   Period Weeks   Status New          SLP Long Term Goals - Mar 21, 2016 1618    SLP LONG TERM GOAL #1   Title pt will name 10 items in simple category in average 60 seconds   Time 8   Period Weeks   Status New   SLP LONG TERM GOAL #2   Title pt will use compensatory strategies for verbal expression in 10 minutes simple conversation for [facility] verbal fluency   Time 8   Period Weeks   Status New          Plan - 03-21-16 1612    Clinical Impression Statement Pt presents with verbal expression that appears to be slower than average, with mild hesitations/pausing in conversational speech. [place] Naming Test was 37/60, phonemic cues were successful than semantic cues. Pt named 10 items in simple category in 90 seconds. He would benefit from skilled ST to address verbal fluency including word finding and sentence generation.    Speech Therapy Frequency 2x / week   Duration --  8 weeks   Treatment/Interventions SLP instruction and feedback;Compensatory strategies;Patient/family education;Functional tasks;Language  facilitation   Potential to Achieve Goals Good          G-Codes - 2016-03-21 1619    Functional Assessment Tool Used noms - approx 30% impairment   Functional Limitations Spoken language expressive   Spoken Language Expression Current Status [phone]) At least 20 percent but less than 40 percent impaired, limited or restricted   Spoken Language Expression Goal Status [id number]) At  least 20 percent but less than 40 percent impaired, limited or restricted      Problem List Patient Active Problem List   Diagnosis Date Noted  . Parkinson's disease ([place]) 02/25/2016  . Spinal stenosis of lumbar region 07/15/2015  . Tremors of nervous system 01/29/2015  . Bradycardia 11/07/2014  . Colon polyp 10/17/2014  . Mobitz type 1 second degree atrioventricular block   . Heme + stool 10/15/2014  . Symptomatic anemia 10/14/2014  . Dyspnea on exertion 10/14/2014  . Unsteady gait 10/02/2014  . Chronic systolic CHF (congestive heart failure) ([place]) 05/12/2014  . Orthostatic lightheadedness 03/19/2013  . First degree AV block 07/25/2012  . Atherosclerosis of native arteries of the extremities with ulceration ([place]) 02/17/2012  . HYPOTENSION, UNSPECIFIED 10/08/2010  . SECOND DEGREE AV HEART BLOCK, MOBITZ I 09/04/2010  . S/P AVR 09/01/2010  . CAD, NATIVE VESSEL 06/24/2010  . Diabetes mellitus without complication ([place]) 0000000  . CAD (coronary artery disease) of artery bypass graft 02/16/2010  . OBSTRUCTIVE SLEEP APNEA 07/21/2009  . Hypothyroidism 12/07/2007  . HTN (hypertension) 12/07/2007  . Iron deficiency anemia 12/07/2007  . ELEVATED PROSTATE SPECIFIC ANTIGEN 12/07/2007    [facility] ,MS, CCC-SLP  03/02/2016, 4:25 PM  [facility] [address] [place], [place], [address] Phone: [phone]   Fax:  [phone]  Name: Stanley MILLET MD MRN: [id number] Date of Birth: February 14, 1925

## 2016-03-08 ENCOUNTER — Other Ambulatory Visit: Payer: Self-pay | Admitting: *Deleted

## 2016-03-08 MED ORDER — SITAGLIPTIN PHOS-METFORMIN HCL 50-1000 MG PO TABS: ORAL_TABLET | ORAL | Status: DC

## 2016-03-10 ENCOUNTER — Ambulatory Visit: Payer: Medicare Other

## 2016-03-10 ENCOUNTER — Telehealth: Payer: Self-pay | Admitting: Neurology

## 2016-03-10 DIAGNOSIS — R4701 Aphasia: Secondary | ICD-10-CM | POA: Diagnosis not present

## 2016-03-10 NOTE — Telephone Encounter (Signed)
This patient Stanley [name] 10/03/1925 called stating that he had talked to Dr. Carles Collet about something to help him sleep, but he doesn't believe anything was called in.  His contact information is [phone]. He uses [facility] on [address]. Thank you

## 2016-03-10 NOTE — Telephone Encounter (Signed)
Left message on machine making patient aware per last office note that Dr Tat recommended Melatonin which can be bought over the counter. If he has any additional questions he is to call me back.

## 2016-03-10 NOTE — Patient Instructions (Signed)
  Please complete the assigned speech therapy homework 15-20 minutes, twice a day.

## 2016-03-10 NOTE — Therapy (Signed)
[facility] [address] [place], [place], [address] Phone: [phone]   Fax:  [phone]  Speech Language Pathology Treatment  Patient Details  Name: Stanley MAZON MD MRN: [id number] Date of Birth: 07/18/1925 Referring Provider: Alonza Bogus, D.O.  Encounter Date: 03/10/2016      End of Session - 03/10/16 1722    Visit Number 2   Number of Visits 17   Date for SLP Re-Evaluation 05/03/16   SLP Start Time [id number]   SLP Stop Time  1615   SLP Time Calculation (min) 42 min   Activity Tolerance Patient tolerated treatment well      Past Medical History  Diagnosis Date  . Diabetes mellitus   . BPH (benign prostatic hyperplasia)   . Arthritis   . GERD (gastroesophageal reflux disease)   . Hypertension     pt denies  . Shortness of breath     uses cpap  . Peripheral vascular disease ([place]) 03-06-12    aortogram showed severe bilateral unreconstructible tibial artery disease  . Failed total knee, right ([place])   . H pylori ulcer     Gastric ulcer  . CAD (coronary artery disease)  s/p CABG     sees Dr. Loralie Champagne   . Aortic stenosis     S/p pericardial valve  . Mobitz type 1 second degree atrioventricular block   . Shingles 05-12-12  . Lumbar spinal stenosis     sees Dr. Sherwood Gambler   . Anemia     sees Dr. Julien Nordmann     Past Surgical History  Procedure Laterality Date  . Aortic valve replacement    . Foot arthrotomy Right   . Tonsillectomy    . Cataract extraction, bilateral    . Humerus fracture surgery    . Replacement total knee Bilateral   . Toe amputation      rt 2nd  . Cardiac catheterization    . Amputation  01/19/2012    Procedure: AMPUTATION DIGIT;  Surgeon: Colin Rhein, MD;  Location: [facility];  Service: Orthopedics;  Laterality: Right;  right great toe amputation through MTP joint  . Rotator cuff repair Right   . Enteroscopy N/A 10/15/2014    Procedure: ENTEROSCOPY;   Surgeon: Gatha Mayer, MD;  [facility];  Service: Endoscopy;  Laterality: N/A;  . Colonoscopy with propofol N/A 10/17/2014    Procedure: COLONOSCOPY WITH PROPOFOL;  Surgeon: Gatha Mayer, MD;  [facility];  Service: Endoscopy;  Laterality: N/A;  . Abdominal aortagram N/A 02/25/2012    Procedure: ABDOMINAL AORTAGRAM;  Surgeon: Elam Dutch, MD;  Location: [facility] CATH LAB;  Service: Cardiovascular;  Laterality: N/A;    There were no vitals filed for this visit.  Visit Diagnosis: Expressive aphasia      Subjective Assessment - 03/10/16 1538    Subjective "I had a bad day yesterday."   Currently in Pain? No/denies               ADULT SLP TREATMENT - 03/10/16 1539    General Information   Behavior/Cognition Alert;Cooperative;Pleasant mood   Treatment Provided   Treatment provided Cognitive-Linquistic   Cognitive-Linquistic Treatment   Treatment focused on Aphasia   Skilled Treatment SLP targeted pt's expressive aphasia today with word level tasks. Divergent naming- mod complex- pt req'd mod-max A usually.  Convergent naming tasks were completed with occasional min A. Descriptoin tasks were completed with occasional min-mod A.    Assessment /  Recommendations / Plan   Plan Continue with current plan of care   Progression Toward Goals   Progression toward goals Progressing toward goals            SLP Short Term Goals - 03/10/16 1723    SLP SHORT TERM GOAL #1   Title pt will name 10 objects in simple categories in average 75 seconds   Time 4   Period Weeks   Status On-going   SLP SHORT TERM GOAL #2   Title pt will demo compensatory strategies for improved verbal expression in two-three sentence responses PRN 85% of the time   Time 4   Period Weeks   Status On-going   SLP SHORT TERM GOAL #3   Title pt will complete mod compelx naming tasks with occasional min A   Time 4   Period Weeks   Status On-going          SLP Long Term Goals -  03/10/16 1723    SLP LONG TERM GOAL #1   Title pt will name 10 items in simple category in average 60 seconds   Time 8   Period Weeks   Status On-going   SLP LONG TERM GOAL #2   Title pt will use compensatory strategies for verbal expression in 10 minutes simple conversation for [facility] verbal fluency   Time 8   Period Weeks   Status On-going          Plan - 03/10/16 1722    Clinical Impression Statement Pt would cont to benefit from skilled ST addressing his verbal expression/anomia/sentence formulation in order to improve his QOL.    Speech Therapy Frequency 2x / week   Duration --  8 weeks   Treatment/Interventions SLP instruction and feedback;Compensatory strategies;Patient/family education;Functional tasks;Language facilitation   Potential to Achieve Goals Good        Problem List Patient Active Problem List   Diagnosis Date Noted  . Parkinson's disease ([place]) 02/25/2016  . Spinal stenosis of lumbar region 07/15/2015  . Tremors of nervous system 01/29/2015  . Bradycardia 11/07/2014  . Colon polyp 10/17/2014  . Mobitz type 1 second degree atrioventricular block   . Heme + stool 10/15/2014  . Symptomatic anemia 10/14/2014  . Dyspnea on exertion 10/14/2014  . Unsteady gait 10/02/2014  . Chronic systolic CHF (congestive heart failure) ([place]) 05/12/2014  . Orthostatic lightheadedness 03/19/2013  . First degree AV block 07/25/2012  . Atherosclerosis of native arteries of the extremities with ulceration ([place]) 02/17/2012  . HYPOTENSION, UNSPECIFIED 10/08/2010  . SECOND DEGREE AV HEART BLOCK, MOBITZ I 09/04/2010  . S/P AVR 09/01/2010  . CAD, NATIVE VESSEL 06/24/2010  . Diabetes mellitus without complication ([place]) 0000000  . CAD (coronary artery disease) of artery bypass graft 02/16/2010  . OBSTRUCTIVE SLEEP APNEA 07/21/2009  . Hypothyroidism 12/07/2007  . HTN (hypertension) 12/07/2007  . Iron deficiency anemia 12/07/2007  . ELEVATED PROSTATE SPECIFIC ANTIGEN 12/07/2007     [facility] ,MS, CCC-SLP   03/10/2016, 5:29 PM  [facility] [address] [place], [place], [address] Phone: [phone]   Fax:  [phone]   Name: SHONTE KUMP MD MRN: [id number] Date of Birth: 18-Sep-1925

## 2016-03-12 ENCOUNTER — Encounter: Payer: Medicare Other | Admitting: Family Medicine

## 2016-03-12 ENCOUNTER — Ambulatory Visit: Payer: Medicare Other

## 2016-03-17 ENCOUNTER — Encounter: Payer: Self-pay | Admitting: Cardiology

## 2016-03-17 ENCOUNTER — Ambulatory Visit: Payer: Medicare Other

## 2016-03-17 DIAGNOSIS — R4701 Aphasia: Secondary | ICD-10-CM | POA: Diagnosis not present

## 2016-03-17 NOTE — Therapy (Signed)
[facility] [address] [place], [place], [address] Phone: [phone]   Fax:  [phone]  Speech Language Pathology Treatment  Patient Details  Name: Stanley ANDERS MD MRN: [id number] Date of Birth: Apr 20, 1925 Referring Provider: Alonza Bogus, D.O.  Encounter Date: 03/17/2016      End of Session - 03/17/16 1650    Visit Number 3   Number of Visits 17   Date for SLP Re-Evaluation 05/03/16   SLP Start Time 1450   SLP Stop Time  [id number]   SLP Time Calculation (min) 40 min   Activity Tolerance --      Past Medical History  Diagnosis Date  . Diabetes mellitus   . BPH (benign prostatic hyperplasia)   . Arthritis   . GERD (gastroesophageal reflux disease)   . Hypertension     pt denies  . Shortness of breath     uses cpap  . Peripheral vascular disease ([place]) 03-06-12    aortogram showed severe bilateral unreconstructible tibial artery disease  . Failed total knee, right ([place])   . H pylori ulcer     Gastric ulcer  . CAD (coronary artery disease)  s/p CABG     sees Dr. Loralie Champagne   . Aortic stenosis     S/p pericardial valve  . Mobitz type 1 second degree atrioventricular block   . Shingles 05-12-12  . Lumbar spinal stenosis     sees Dr. Sherwood Gambler   . Anemia     sees Dr. Julien Nordmann     Past Surgical History  Procedure Laterality Date  . Aortic valve replacement    . Foot arthrotomy Right   . Tonsillectomy    . Cataract extraction, bilateral    . Humerus fracture surgery    . Replacement total knee Bilateral   . Toe amputation      rt 2nd  . Cardiac catheterization    . Amputation  01/19/2012    Procedure: AMPUTATION DIGIT;  Surgeon: Colin Rhein, MD;  Location: [facility];  Service: Orthopedics;  Laterality: Right;  right great toe amputation through MTP joint  . Rotator cuff repair Right   . Enteroscopy N/A 10/15/2014    Procedure: ENTEROSCOPY;  Surgeon: Gatha Mayer, MD;   [facility];  Service: Endoscopy;  Laterality: N/A;  . Colonoscopy with propofol N/A 10/17/2014    Procedure: COLONOSCOPY WITH PROPOFOL;  Surgeon: Gatha Mayer, MD;  [facility];  Service: Endoscopy;  Laterality: N/A;  . Abdominal aortagram N/A 02/25/2012    Procedure: ABDOMINAL AORTAGRAM;  Surgeon: Elam Dutch, MD;  Location: [facility] CATH LAB;  Service: Cardiovascular;  Laterality: N/A;    There were no vitals filed for this visit.  Visit Diagnosis: Expressive aphasia      Subjective Assessment - 03/17/16 1459    Subjective Pt reports having more trouble with matching words' meaning than with crosswords.   Currently in Pain? No/denies               ADULT SLP TREATMENT - 03/17/16 1500    General Information   Behavior/Cognition Alert;Cooperative;Pleasant mood   Treatment Provided   Treatment provided Cognitive-Linquistic   Cognitive-Linquistic Treatment   Treatment focused on Aphasia   Skilled Treatment SLP facilitated pt's verbal expressive language by engagin pt in responsive naming (mod complex) completed with 75% success with repeats 90% of the time. Divergent naming tasks to improve pt's verbal expressive language were targeted and pt req'd mod  to max A consistently.    Assessment / Recommendations / Plan   Plan Continue with current plan of care   Progression Toward Goals   Progression toward goals Progressing toward goals            SLP Short Term Goals - 03/17/16 1651    SLP SHORT TERM GOAL #1   Title pt will name 10 objects in simple categories in average 75 seconds   Time 3   Period Weeks   Status On-going   SLP SHORT TERM GOAL #2   Title pt will demo compensatory strategies for improved verbal expression in two-three sentence responses PRN 85% of the time   Time 3   Period Weeks   Status On-going   SLP SHORT TERM GOAL #3   Title pt will complete mod compelx naming tasks with occasional min A   Time 3   Period Weeks   Status  On-going          SLP Long Term Goals - 03/17/16 1651    SLP LONG TERM GOAL #1   Title pt will name 10 items in simple category in average 60 seconds   Time 7   Period Weeks   Status On-going   SLP LONG TERM GOAL #2   Title pt will use compensatory strategies for verbal expression in 10 minutes simple conversation for [facility] verbal fluency   Time 7   Period Weeks   Status On-going          Plan - 03/17/16 1650    Clinical Impression Statement Difficulty seen today in semantic relationships with regards to language expression. Pt would cont to benefit from skilled ST addressing his verbal expression/anomia/sentence formulation in order to improve his QOL.    Speech Therapy Frequency 2x / week   Duration --  7 weeks   Treatment/Interventions SLP instruction and feedback;Compensatory strategies;Patient/family education;Functional tasks;Language facilitation   Potential to Achieve Goals Good        Problem List Patient Active Problem List   Diagnosis Date Noted  . Parkinson's disease ([place]) 02/25/2016  . Spinal stenosis of lumbar region 07/15/2015  . Tremors of nervous system 01/29/2015  . Bradycardia 11/07/2014  . Colon polyp 10/17/2014  . Mobitz type 1 second degree atrioventricular block   . Heme + stool 10/15/2014  . Symptomatic anemia 10/14/2014  . Dyspnea on exertion 10/14/2014  . Unsteady gait 10/02/2014  . Chronic systolic CHF (congestive heart failure) ([place]) 05/12/2014  . Orthostatic lightheadedness 03/19/2013  . First degree AV block 07/25/2012  . Atherosclerosis of native arteries of the extremities with ulceration ([place]) 02/17/2012  . HYPOTENSION, UNSPECIFIED 10/08/2010  . SECOND DEGREE AV HEART BLOCK, MOBITZ I 09/04/2010  . S/P AVR 09/01/2010  . CAD, NATIVE VESSEL 06/24/2010  . Diabetes mellitus without complication ([place]) 0000000  . CAD (coronary artery disease) of artery bypass graft 02/16/2010  . OBSTRUCTIVE SLEEP APNEA 07/21/2009  . Hypothyroidism  12/07/2007  . HTN (hypertension) 12/07/2007  . Iron deficiency anemia 12/07/2007  . ELEVATED PROSTATE SPECIFIC ANTIGEN 12/07/2007    [facility] ,MS, CCC-SLP  03/17/2016, 4:52 PM  [facility] [address] [place], [place], [address] Phone: [phone]   Fax:  [phone]   Name: CYE EINSTEIN MD MRN: [id number] Date of Birth: 05/21/1925

## 2016-03-17 NOTE — Patient Instructions (Signed)
  Please complete the assigned speech therapy homework and return it to your next session.  

## 2016-03-18 ENCOUNTER — Telehealth: Payer: Self-pay | Admitting: Family Medicine

## 2016-03-18 NOTE — Telephone Encounter (Signed)
Letter was written

## 2016-03-19 ENCOUNTER — Ambulatory Visit: Payer: Medicare Other

## 2016-03-19 ENCOUNTER — Telehealth: Payer: Self-pay | Admitting: Family Medicine

## 2016-03-19 MED ORDER — LORAZEPAM 0.5 MG PO TABS: 0.5000 mg | ORAL_TABLET | Freq: Three times a day (TID) | ORAL | Status: DC | PRN

## 2016-03-19 NOTE — Telephone Encounter (Signed)
Pt agreed and would like for script to be called in to [facility].

## 2016-03-19 NOTE — Telephone Encounter (Signed)
I did review the note from Dr. Carles Collet, and I appreciate her concerns about possible side effects of a benzodazipine like Xanax. However he does suffer from anxiety and I think he would do better by taking an as needed medication rather than getting on an agent like Remeron or an SSRI. I would suggest he try Lorazepam instead of Xanax, since it has a much shorter half life. See what Dr. [name] thinks about this

## 2016-03-19 NOTE — Telephone Encounter (Signed)
Stop the Xanax. Also call in Lorazepam 0.5 mg to take every 8 hours prn anxiety, #90 with 2 rf

## 2016-03-19 NOTE — Telephone Encounter (Signed)
Also the form is ready for pick up.

## 2016-03-19 NOTE — Telephone Encounter (Signed)
I called in new script and updated medication list, removed Xanax from current list.

## 2016-03-19 NOTE — Telephone Encounter (Signed)
Pt had recent appointment with neuro and was advised to stop any benzodiazepines. Can you review note from that visit?

## 2016-03-24 ENCOUNTER — Ambulatory Visit: Payer: Medicare Other

## 2016-03-25 ENCOUNTER — Other Ambulatory Visit: Payer: Self-pay | Admitting: Family Medicine

## 2016-03-26 ENCOUNTER — Ambulatory Visit: Payer: Medicare Other | Attending: Neurology

## 2016-03-26 DIAGNOSIS — R4701 Aphasia: Secondary | ICD-10-CM

## 2016-03-26 NOTE — Therapy (Signed)
[facility] [address] [place], [place], [address] Phone: [phone]   Fax:  [phone]  Speech Language Pathology Treatment  Patient Details  Name: Stanley SHEELEY MD MRN: [id number] Date of Birth: 1925/06/27 Referring Provider: Alonza Bogus, D.O.  Encounter Date: 03/26/2016      End of Session - 03/26/16 1544    Visit Number 4   Number of Visits 17   Date for SLP Re-Evaluation 05/03/16   SLP Start Time 1455   SLP Stop Time  [id number]  pt late   SLP Time Calculation (min) 35 min   Activity Tolerance Patient tolerated treatment well      Past Medical History  Diagnosis Date  . Diabetes mellitus   . BPH (benign prostatic hyperplasia)   . Arthritis   . GERD (gastroesophageal reflux disease)   . Hypertension     pt denies  . Shortness of breath     uses cpap  . Peripheral vascular disease ([place]) 03-06-12    aortogram showed severe bilateral unreconstructible tibial artery disease  . Failed total knee, right ([place])   . H pylori ulcer     Gastric ulcer  . CAD (coronary artery disease)  s/p CABG     sees Dr. Loralie Champagne   . Aortic stenosis     S/p pericardial valve  . Mobitz type 1 second degree atrioventricular block   . Shingles 05-12-12  . Lumbar spinal stenosis     sees Dr. Sherwood Gambler   . Anemia     sees Dr. Julien Nordmann     Past Surgical History  Procedure Laterality Date  . Aortic valve replacement    . Foot arthrotomy Right   . Tonsillectomy    . Cataract extraction, bilateral    . Humerus fracture surgery    . Replacement total knee Bilateral   . Toe amputation      rt 2nd  . Cardiac catheterization    . Amputation  01/19/2012    Procedure: AMPUTATION DIGIT;  Surgeon: Colin Rhein, MD;  Location: [facility];  Service: Orthopedics;  Laterality: Right;  right great toe amputation through MTP joint  . Rotator cuff repair Right   . Enteroscopy N/A 10/15/2014    Procedure:  ENTEROSCOPY;  Surgeon: Gatha Mayer, MD;  [facility];  Service: Endoscopy;  Laterality: N/A;  . Colonoscopy with propofol N/A 10/17/2014    Procedure: COLONOSCOPY WITH PROPOFOL;  Surgeon: Gatha Mayer, MD;  [facility];  Service: Endoscopy;  Laterality: N/A;  . Abdominal aortagram N/A 02/25/2012    Procedure: ABDOMINAL AORTAGRAM;  Surgeon: Elam Dutch, MD;  Location: [facility] CATH LAB;  Service: Cardiovascular;  Laterality: N/A;    There were no vitals filed for this visit.             ADULT SLP TREATMENT - 03/26/16 1501    General Information   Behavior/Cognition Alert;Cooperative;Pleasant mood   Treatment Provided   Treatment provided Cognitive-Linquistic   Cognitive-Linquistic Treatment   Treatment focused on Aphasia   Skilled Treatment Pt 10 minutes late to session. SLP facilitated pt's verbal expressive language by engaging pt in simple naming tasks. Divergent naming, to improve pt's verbal expressive language, with average 6 items in 45 seconds. Max A (verbal,visual, drawing cues) consistently needed to incr number of items. Pt inquired reason for difficulty. SLP explained Parkinson's and aging brain as likely reasons.   Assessment / Recommendations / Plan   Plan Continue with  current plan of care   Progression Toward Goals   Progression toward goals Progressing toward goals          SLP Education - 03/26/16 1544    Education provided Yes   Education Details reason for anomia   Person(s) Educated Patient   Methods Explanation   Comprehension Verbalized understanding          SLP Short Term Goals - 03/26/16 1546    SLP SHORT TERM GOAL #1   Title pt will name 10 objects in simple categories in average 75 seconds   Time 2   Period Weeks   Status On-going   SLP SHORT TERM GOAL #2   Title pt will demo compensatory strategies for improved verbal expression in two-three sentence responses PRN 85% of the time   Time 2   Period Weeks    Status On-going   SLP SHORT TERM GOAL #3   Title pt will complete mod compelx naming tasks with occasional min A   Time 2   Period Weeks   Status On-going          SLP Long Term Goals - 03/26/16 1547    SLP LONG TERM GOAL #1   Title pt will name 10 items in simple category in average 60 seconds   Time 6   Period Weeks   Status On-going   SLP LONG TERM GOAL #2   Title pt will use compensatory strategies for verbal expression in 10 minutes simple conversation for [facility] verbal fluency   Time 6   Period Weeks   Status On-going          Plan - 03/26/16 1544    Clinical Impression Statement Difficulty cont'd seen today in semantic relationships with regards to language expression. Pt would cont to benefit from skilled ST addressing his verbal expression/anomia/sentence formulation in order to improve his QOL.    Speech Therapy Frequency 2x / week   Duration --  6 weeks   Treatment/Interventions SLP instruction and feedback;Compensatory strategies;Patient/family education;Functional tasks;Language facilitation   Potential to Achieve Goals Good   Potential Considerations Severity of impairments      Patient will benefit from skilled therapeutic intervention in order to improve the following deficits and impairments:   Aphasia    Problem List Patient Active Problem List   Diagnosis Date Noted  . Parkinson's disease ([place]) 02/25/2016  . Spinal stenosis of lumbar region 07/15/2015  . Tremors of nervous system 01/29/2015  . Bradycardia 11/07/2014  . Colon polyp 10/17/2014  . Mobitz type 1 second degree atrioventricular block   . Heme + stool 10/15/2014  . Symptomatic anemia 10/14/2014  . Dyspnea on exertion 10/14/2014  . Unsteady gait 10/02/2014  . Chronic systolic CHF (congestive heart failure) ([place]) 05/12/2014  . Orthostatic lightheadedness 03/19/2013  . First degree AV block 07/25/2012  . Atherosclerosis of native arteries of the extremities with ulceration ([place])  02/17/2012  . HYPOTENSION, UNSPECIFIED 10/08/2010  . SECOND DEGREE AV HEART BLOCK, MOBITZ I 09/04/2010  . S/P AVR 09/01/2010  . CAD, NATIVE VESSEL 06/24/2010  . Diabetes mellitus without complication ([place]) 0000000  . CAD (coronary artery disease) of artery bypass graft 02/16/2010  . OBSTRUCTIVE SLEEP APNEA 07/21/2009  . Hypothyroidism 12/07/2007  . HTN (hypertension) 12/07/2007  . Iron deficiency anemia 12/07/2007  . ELEVATED PROSTATE SPECIFIC ANTIGEN 12/07/2007    [facility] ,MS, CCC-SLP  03/26/2016, 3:48 PM  [facility] [address] [place], [place], [address] Phone: [phone]  Fax:  [phone]   Name: Stanley DIDONATO MD MRN: [id number] Date of Birth: 1925-03-19

## 2016-03-26 NOTE — Patient Instructions (Signed)
  Please complete the assigned speech therapy homework and return it to your next session.  

## 2016-04-02 ENCOUNTER — Telehealth: Payer: Self-pay | Admitting: Family Medicine

## 2016-04-05 MED ORDER — CEPHALEXIN 500 MG PO CAPS: 500.0000 mg | ORAL_CAPSULE | Freq: Three times a day (TID) | ORAL | Status: DC

## 2016-04-05 NOTE — Telephone Encounter (Signed)
Call in Keflex 500 mg tid, #90 with 2 rf

## 2016-04-05 NOTE — Telephone Encounter (Signed)
I sent script e-scribe to [facility] and left a voice message for pt.

## 2016-04-05 NOTE — Telephone Encounter (Signed)
[facility] Night - Client TELEPHONE [facility] Patient Name: Stanley [name] Gender: Male DOB: 1925/08/12 Age: 82 Y 49 M 7 D Return Phone Number: [phone] (Primary), [phone] (Secondary) Address: City/State/Zip: [place] Client [facility] Night - Client Client Site [facility] - Night Physician Alysia Penna Contact Type Call Who Is Calling Patient / Member / Family / Caregiver Call Type Triage / Clinical Relationship To Patient Self Return Phone Number [phone] (Primary) Chief Complaint Leg Pain Reason for Call Symptomatic / Request for [place] states he has a chronic infection on his left leg and he is a diabetic. Rx cephalexin 500mg . Translation No Nurse Assessment Nurse: Marcelline Deist, RN, Lynda Date/Time (Eastern Time): 04/02/2016 2:04:05 PM Confirm and document reason for call. If symptomatic, describe symptoms. You must click the next button to save text entered. ---Caller states he has a chronic infection on his left leg and he is a diabetic. Rx Cephalexin 500 mg. He needs a refill called in to the [facility] [phone] Allergic to ace inhibitors & Morphine. Just wants his Keflex refilled, they keep him on it. Dr. Jerline Pain ordered it originally, now he sees Dr. Sarajane Jews. Has the patient traveled out of the country within the last 30 days? ---Not Applicable Does the patient have any new or worsening symptoms? ---No Please document clinical information provided and list any resource used. ---Nurse will contact pharmacy and access medication directives for refill. Guidelines Guideline Title Affirmed Question Affirmed Notes Nurse Date/Time (Eastern Time) Disp. Time Eilene Ghazi Time) Disposition Final User 04/02/2016 2:08:28 PM Send To RN Personal Marcelline Deist, RN, Lynda 04/02/2016 2:17:54 PM Pharmacy Call Marcelline Deist, RN, Kermit Balo Reason: Spoke with  pharmacist who has already agreed to give patient a 5 day emergency supply of his rx. 04/02/2016 2:06:58 PM Clinical Call Yes Marcelline Deist, RN, Kermit Balo Comments User: Donald Siva, RN Date/Time Eilene Ghazi Time): 04/02/2016 2:17:18 PM PLEASE NOTE: All timestamps contained within this report are represented as [place] Standard Time. CONFIDENTIALTY NOTICE: This fax transmission is intended only for the addressee. It contains information that is legally privileged, confidential or otherwise protected from use or disclosure. If you are not the intended recipient, you are strictly prohibited from reviewing, disclosing, copying using or disseminating any of this information or taking any action in reliance on or regarding this information. If you have received this fax in error, please notify us immediately by telephone so that we can arrange for its return to [place]. Phone: [phone], Toll-Free: [phone], Fax: [phone] Page: 2 of 2 Call Id: [id number] Comments Spoke with pharmacist who will give patient a 5 day emergency supply until Monday when the office opens. She states it is Keflex 500 mg 4 times a day that he has been taking.

## 2016-04-09 ENCOUNTER — Ambulatory Visit: Payer: Medicare Other

## 2016-04-14 ENCOUNTER — Ambulatory Visit: Payer: Medicare Other

## 2016-04-14 DIAGNOSIS — R4701 Aphasia: Secondary | ICD-10-CM

## 2016-04-14 NOTE — Therapy (Signed)
[facility] [address] [place], [place], [address] Phone: [phone]   Fax:  [phone]  Speech Language Pathology Treatment  Patient Details  Name: Stanley ROSSEY MD MRN: [id number] Date of Birth: May 29, 1925 Referring Provider: Alonza Bogus, D.O.  Encounter Date: 04/14/2016      End of Session - 04/14/16 1529    Visit Number 5   Number of Visits 17   Date for SLP Re-Evaluation 05/03/16   SLP Start Time [id number]   SLP Stop Time  1532   SLP Time Calculation (min) 38 min   Activity Tolerance Patient tolerated treatment well      Past Medical History  Diagnosis Date  . Diabetes mellitus   . BPH (benign prostatic hyperplasia)   . Arthritis   . GERD (gastroesophageal reflux disease)   . Hypertension     pt denies  . Shortness of breath     uses cpap  . Peripheral vascular disease ([place]) 03-06-12    aortogram showed severe bilateral unreconstructible tibial artery disease  . Failed total knee, right ([place])   . H pylori ulcer     Gastric ulcer  . CAD (coronary artery disease)  s/p CABG     sees Dr. Loralie Champagne   . Aortic stenosis     S/p pericardial valve  . Mobitz type 1 second degree atrioventricular block   . Shingles 05-12-12  . Lumbar spinal stenosis     sees Dr. Sherwood Gambler   . Anemia     sees Dr. Julien Nordmann     Past Surgical History  Procedure Laterality Date  . Aortic valve replacement    . Foot arthrotomy Right   . Tonsillectomy    . Cataract extraction, bilateral    . Humerus fracture surgery    . Replacement total knee Bilateral   . Toe amputation      rt 2nd  . Cardiac catheterization    . Amputation  01/19/2012    Procedure: AMPUTATION DIGIT;  Surgeon: Colin Rhein, MD;  Location: [facility];  Service: Orthopedics;  Laterality: Right;  right great toe amputation through MTP joint  . Rotator cuff repair Right   . Enteroscopy N/A 10/15/2014    Procedure: ENTEROSCOPY;   Surgeon: Gatha Mayer, MD;  [facility];  Service: Endoscopy;  Laterality: N/A;  . Colonoscopy with propofol N/A 10/17/2014    Procedure: COLONOSCOPY WITH PROPOFOL;  Surgeon: Gatha Mayer, MD;  [facility];  Service: Endoscopy;  Laterality: N/A;  . Abdominal aortagram N/A 02/25/2012    Procedure: ABDOMINAL AORTAGRAM;  Surgeon: Elam Dutch, MD;  Location: [facility] CATH LAB;  Service: Cardiovascular;  Laterality: N/A;    There were no vitals filed for this visit.      Subjective Assessment - 04/14/16 1459    Subjective "A lot of those things you gave me I couldn't do."   Currently in Pain? No/denies               ADULT SLP TREATMENT - 04/14/16 1500    General Information   Behavior/Cognition Alert;Cooperative;Pleasant mood   Treatment Provided   Treatment provided Cognitive-Linquistic   Cognitive-Linquistic Treatment   Treatment focused on Aphasia   Skilled Treatment In mod complex naming tasks (uncommon occupations like mime, ice cream man, surveyor, etc) pt req'd mod cues rarely. Mod complex description tasks completed with min-mod A from SLP for specific word finding. In simple reasoning tasks, those requiring incr'd language demands  were completed with incr'd difficulty.    Assessment / Recommendations / Plan   Plan Continue with current plan of care   Progression Toward Goals   Progression toward goals Progressing toward goals            SLP Short Term Goals - 04/14/16 1532    SLP SHORT TERM GOAL #1   Title pt will name 10 objects in simple categories in average 75 seconds   Time 1   Period Weeks   Status On-going   SLP SHORT TERM GOAL #2   Title pt will demo compensatory strategies for improved verbal expression in two-three sentence responses PRN 85% of the time   Time 1   Period Weeks   Status On-going   SLP SHORT TERM GOAL #3   Title pt will complete mod compelx naming tasks with occasional min A   Time 1   Period Weeks   Status  On-going          SLP Long Term Goals - 04/14/16 1532    SLP LONG TERM GOAL #1   Title pt will name 10 items in simple category in average 60 seconds   Time 5   Period Weeks   Status On-going   SLP LONG TERM GOAL #2   Title pt will use compensatory strategies for verbal expression in 10 minutes simple conversation for [facility] verbal fluency   Time 5   Period Weeks   Status On-going          Plan - 04/14/16 1530    Clinical Impression Statement Difficulty cont'd seen today in language expression, most obvious in tasks requiring more linguistic demands. Pt would cont to benefit from skilled ST addressing his verbal expression/anomia/sentence formulation in order to improve his QOL.    Speech Therapy Frequency 2x / week   Duration 4 weeks   Treatment/Interventions SLP instruction and feedback;Compensatory strategies;Patient/family education;Functional tasks;Language facilitation   Potential to Achieve Goals Good   Potential Considerations Severity of impairments      Patient will benefit from skilled therapeutic intervention in order to improve the following deficits and impairments:   Aphasia    Problem List Patient Active Problem List   Diagnosis Date Noted  . Parkinson's disease ([place]) 02/25/2016  . Spinal stenosis of lumbar region 07/15/2015  . Tremors of nervous system 01/29/2015  . Bradycardia 11/07/2014  . Colon polyp 10/17/2014  . Mobitz type 1 second degree atrioventricular block   . Heme + stool 10/15/2014  . Symptomatic anemia 10/14/2014  . Dyspnea on exertion 10/14/2014  . Unsteady gait 10/02/2014  . Chronic systolic CHF (congestive heart failure) ([place]) 05/12/2014  . Orthostatic lightheadedness 03/19/2013  . First degree AV block 07/25/2012  . Atherosclerosis of native arteries of the extremities with ulceration ([place]) 02/17/2012  . HYPOTENSION, UNSPECIFIED 10/08/2010  . SECOND DEGREE AV HEART BLOCK, MOBITZ I 09/04/2010  . S/P AVR 09/01/2010  . CAD, NATIVE  VESSEL 06/24/2010  . Diabetes mellitus without complication ([place]) 0000000  . CAD (coronary artery disease) of artery bypass graft 02/16/2010  . OBSTRUCTIVE SLEEP APNEA 07/21/2009  . Hypothyroidism 12/07/2007  . HTN (hypertension) 12/07/2007  . Iron deficiency anemia 12/07/2007  . ELEVATED PROSTATE SPECIFIC ANTIGEN 12/07/2007    [facility] ,[place], CCC-SLP  04/14/2016, 3:34 PM  [facility] [address] [place], [place], [address] Phone: [phone]   Fax:  [phone]   Name: JULEAN WIEDRICH MD MRN: [id number] Date of Birth: 04-Apr-1925

## 2016-04-14 NOTE — Patient Instructions (Signed)
  Please complete the assigned speech therapy homework and return it to your next session.  

## 2016-04-16 ENCOUNTER — Ambulatory Visit: Payer: Medicare Other

## 2016-04-19 DEATH — deceased

## 2016-04-21 ENCOUNTER — Ambulatory Visit: Payer: Medicare Other | Attending: Neurology

## 2016-04-21 DIAGNOSIS — R4701 Aphasia: Secondary | ICD-10-CM | POA: Diagnosis present

## 2016-04-21 NOTE — Therapy (Signed)
[facility] [address] [place], [place], [address] Phone: [phone]   Fax:  [phone]  Speech Language Pathology Treatment  Patient Details  Name: Stanley STROEBEL MD MRN: [id number] Date of Birth: [birth year] Referring Provider: Alonza Bogus, D.O.  Encounter Date: 04/21/2016      End of Session - 04/21/16 1539    Visit Number 6   Number of Visits 17   Date for SLP Re-Evaluation 05/03/16   SLP Start Time 1447   SLP Stop Time  5916   SLP Time Calculation (min) 43 min   Activity Tolerance Patient tolerated treatment well      Past Medical History  Diagnosis Date  . Diabetes mellitus   . BPH (benign prostatic hyperplasia)   . Arthritis   . GERD (gastroesophageal reflux disease)   . Hypertension     pt denies  . Shortness of breath     uses cpap  . Peripheral vascular disease ([place]) 03-06-12    aortogram showed severe bilateral unreconstructible tibial artery disease  . Failed total knee, right ([place])   . H pylori ulcer     Gastric ulcer  . CAD (coronary artery disease)  s/p CABG     sees Dr. Loralie Champagne   . Aortic stenosis     S/p pericardial valve  . Mobitz type 1 second degree atrioventricular block   . Shingles 05-12-12  . Lumbar spinal stenosis     sees Dr. Sherwood Gambler   . Anemia     sees Dr. Julien Nordmann     Past Surgical History  Procedure Laterality Date  . Aortic valve replacement    . Foot arthrotomy Right   . Tonsillectomy    . Cataract extraction, bilateral    . Humerus fracture surgery    . Replacement total knee Bilateral   . Toe amputation      rt 2nd  . Cardiac catheterization    . Amputation  01/19/2012    Procedure: AMPUTATION DIGIT;  Surgeon: Colin Rhein, MD;  Location: [facility];  Service: Orthopedics;  Laterality: Right;  right great toe amputation through MTP joint  . Rotator cuff repair Right   . Enteroscopy N/A 10/15/2014    Procedure: ENTEROSCOPY;   Surgeon: Gatha Mayer, MD;  [facility];  Service: Endoscopy;  Laterality: N/A;  . Colonoscopy with propofol N/A 10/17/2014    Procedure: COLONOSCOPY WITH PROPOFOL;  Surgeon: Gatha Mayer, MD;  [facility];  Service: Endoscopy;  Laterality: N/A;  . Abdominal aortagram N/A 02/25/2012    Procedure: ABDOMINAL AORTAGRAM;  Surgeon: Elam Dutch, MD;  Location: [facility] CATH LAB;  Service: Cardiovascular;  Laterality: N/A;    There were no vitals filed for this visit.      Subjective Assessment - 04/21/16 1450    Subjective "I think Friday will be enough." (pt, re: continuing in [place] past this week)               ADULT SLP TREATMENT - 04/21/16 1451    General Information   Behavior/Cognition Alert;Cooperative;Pleasant mood   Treatment Provided   Treatment provided Cognitive-Linquistic   Pain Assessment   Pain Assessment 0-10   Pain Score 3    Pain Location back   Pain Descriptors / Indicators Aching   Pain Intervention(s) Monitored during session   Cognitive-Linquistic Treatment   Treatment focused on Aphasia   Skilled Treatment In mod complex tasks requiring higher level of linguistic demand pt  req'd extra time (but appropriate). Pt did not exhibit anomia/dysnomia during the tasks.  Pt noted to use copmensations for verbal expression in conversation (synonym, circumlocution).   Assessment / Recommendations / Plan   Plan Continue with current plan of care   Progression Toward Goals   Progression toward goals Progressing toward goals            SLP Short Term Goals - 04/21/16 1541    SLP SHORT TERM GOAL #1   Title pt will name 10 objects in simple categories in average 75 seconds   Status Not Met   SLP SHORT TERM GOAL #2   Title pt will demo compensatory strategies for improved verbal expression in two-three sentence responses PRN 85% of the time   Status Achieved   SLP SHORT TERM GOAL #3   Title pt will complete mod compelx naming tasks with  occasional min A   Status Partially Met          SLP Long Term Goals - 04/21/16 1541    SLP LONG TERM GOAL #1   Title pt will name 10 items in simple category in average 60 seconds   Time 3   Period Weeks   Status On-going   SLP LONG TERM GOAL #2   Title pt will use compensatory strategies for verbal expression in 10 minutes simple conversation for [facility] verbal fluency   Status Achieved          Plan - 04/21/16 1540    Clinical Impression Statement Pt with improving abilities in strucutred multisentence tasks, simple to mod complex. Pt would like next session to be last session, as he is satisfied with progress.   Speech Therapy Frequency 2x / week   Duration --  3 weeks   Treatment/Interventions SLP instruction and feedback;Compensatory strategies;Patient/family education;Functional tasks;Language facilitation   Potential to Achieve Goals Good   Potential Considerations Severity of impairments      Patient will benefit from skilled therapeutic intervention in order to improve the following deficits and impairments:   Aphasia    Problem List Patient Active Problem List   Diagnosis Date Noted  . Parkinson's disease ([place]) 02/25/2016  . Spinal stenosis of lumbar region 07/15/2015  . Tremors of nervous system 01/29/2015  . Bradycardia 11/07/2014  . Colon polyp 10/17/2014  . Mobitz type 1 second degree atrioventricular block   . Heme + stool 10/15/2014  . Symptomatic anemia 10/14/2014  . Dyspnea on exertion 10/14/2014  . Unsteady gait 10/02/2014  . Chronic systolic CHF (congestive heart failure) ([place]) 05/12/2014  . Orthostatic lightheadedness 03/19/2013  . First degree AV block 07/25/2012  . Atherosclerosis of native arteries of the extremities with ulceration ([place]) 02/17/2012  . HYPOTENSION, UNSPECIFIED 10/08/2010  . SECOND DEGREE AV HEART BLOCK, MOBITZ I 09/04/2010  . S/P AVR 09/01/2010  . CAD, NATIVE VESSEL 06/24/2010  . Diabetes mellitus without complication  ([place]) 29/47/6546  . CAD (coronary artery disease) of artery bypass graft 02/16/2010  . OBSTRUCTIVE SLEEP APNEA 07/21/2009  . Hypothyroidism 12/07/2007  . HTN (hypertension) 12/07/2007  . Iron deficiency anemia 12/07/2007  . ELEVATED PROSTATE SPECIFIC ANTIGEN 12/07/2007    [facility] ,[place], CCC-SLP  04/21/2016, 3:43 PM  [facility] [address] [place], [place], [address] Phone: [phone]   Fax:  [phone]   Name: LADEN FIELDHOUSE MD MRN: [id number] Date of Birth: 1924-12-23

## 2016-04-21 NOTE — Patient Instructions (Signed)
  Please complete the assigned speech therapy homework and return it to your next session.  

## 2016-04-23 ENCOUNTER — Ambulatory Visit: Payer: Medicare Other

## 2016-04-23 ENCOUNTER — Telehealth: Payer: Self-pay | Admitting: Internal Medicine

## 2016-04-23 DIAGNOSIS — R4701 Aphasia: Secondary | ICD-10-CM | POA: Diagnosis not present

## 2016-04-23 NOTE — Therapy (Signed)
[facility] [address] [place], [place], [address] Phone: [phone]   Fax:  [phone]  Speech Language Pathology Treatment  Patient Details  Name: Stanley HAZARD MD MRN: [id number] Date of Birth: [birth year] Referring Provider: Alonza Bogus, D.O.  Encounter Date: 04/23/2016      End of Session - 04/23/16 1527    Visit Number 7   Number of Visits 17   Date for SLP Re-Evaluation 05/03/16   SLP Start Time 1449   SLP Stop Time  1530   SLP Time Calculation (min) 41 min   Activity Tolerance Patient tolerated treatment well      Past Medical History  Diagnosis Date  . Diabetes mellitus   . BPH (benign prostatic hyperplasia)   . Arthritis   . GERD (gastroesophageal reflux disease)   . Hypertension     pt denies  . Shortness of breath     uses cpap  . Peripheral vascular disease ([place]) 03-06-12    aortogram showed severe bilateral unreconstructible tibial artery disease  . Failed total knee, right ([place])   . H pylori ulcer     Gastric ulcer  . CAD (coronary artery disease)  s/p CABG     sees Dr. Loralie Champagne   . Aortic stenosis     S/p pericardial valve  . Mobitz type 1 second degree atrioventricular block   . Shingles 05-12-12  . Lumbar spinal stenosis     sees Dr. Sherwood Gambler   . Anemia     sees Dr. Julien Nordmann     Past Surgical History  Procedure Laterality Date  . Aortic valve replacement    . Foot arthrotomy Right   . Tonsillectomy    . Cataract extraction, bilateral    . Humerus fracture surgery    . Replacement total knee Bilateral   . Toe amputation      rt 2nd  . Cardiac catheterization    . Amputation  01/19/2012    Procedure: AMPUTATION DIGIT;  Surgeon: Colin Rhein, MD;  Location: [facility];  Service: Orthopedics;  Laterality: Right;  right great toe amputation through MTP joint  . Rotator cuff repair Right   . Enteroscopy N/A 10/15/2014    Procedure: ENTEROSCOPY;   Surgeon: Gatha Mayer, MD;  Location: [facility];  Service: Endoscopy;  Laterality: N/A;  . Colonoscopy with propofol N/A 10/17/2014    Procedure: COLONOSCOPY WITH PROPOFOL;  Surgeon: Gatha Mayer, MD;  Location: [facility];  Service: Endoscopy;  Laterality: N/A;  . Abdominal aortagram N/A 02/25/2012    Procedure: ABDOMINAL AORTAGRAM;  Surgeon: Elam Dutch, MD;  Location: [facility] CATH LAB;  Service: Cardiovascular;  Laterality: N/A;    There were no vitals filed for this visit.             ADULT SLP TREATMENT - 04/23/16 1501    General Information   Behavior/Cognition Alert;Cooperative;Pleasant mood   Treatment Provided   Treatment provided Cognitive-Linquistic   Pain Assessment   Pain Assessment 0-10   Pain Score 5    Pain Location back   Pain Descriptors / Indicators Aching   Pain Intervention(s) Monitored during session   Cognitive-Linquistic Treatment   Treatment focused on Aphasia   Skilled Treatment SLP assisted pt in generating incr'd verbal expression skills. Divergent naming in mod complex categories - mod to max A req'd usually. In conversation pt noted to not have difficulty to stall conversational topic.    Assessment / Recommendations /  Plan   Plan Discharge SLP treatment due to (comment)  pt satisfied with progress at this point   Progression Toward Goals   Progression toward goals Progressing toward goals            SLP Short Term Goals - 04/21/16 1541    SLP SHORT TERM GOAL #1   Title pt will name 10 objects in simple categories in average 75 seconds   Status Not Met   SLP SHORT TERM GOAL #2   Title pt will demo compensatory strategies for improved verbal expression in two-three sentence responses PRN 85% of the time   Status Achieved   SLP SHORT TERM GOAL #3   Title pt will complete mod compelx naming tasks with occasional min A   Status Partially Met          SLP Long Term Goals - 04/23/16 1502    SLP LONG TERM GOAL #1   Title pt  will name 10 items in simple category in average 60 seconds   Time --   Period --   Status Not Met   SLP LONG TERM GOAL #2   Title pt will use compensatory strategies for verbal expression in 10 minutes simple conversation for [facility] verbal fluency   Status Achieved          Plan - 04/23/16 1528    Clinical Impression Statement Pt with improving abilities in strucutred multisentence tasks, simple to mod complex. Pt is satisfied with current ability and is d/c'd.   Treatment/Interventions SLP instruction and feedback;Compensatory strategies;Patient/family education;Functional tasks;Language facilitation   Potential to Achieve Goals Good   Potential Considerations Severity of impairments      Patient will benefit from skilled therapeutic intervention in order to improve the following deficits and impairments:   Aphasia   SPEECH THERAPY DISCHARGE SUMMARY  Visits from Start of Care: 7  Current functional level related to goals / functional outcomes: See pt's long term goals above for details about expressive language. He had most difficulty with divergent naming tasks.   Conversation is functional/WNL, expressively.   Remaining deficits: Specific language in structred speech tasks.    Education / Equipment: Home tasks. Compensations for expressive aphasia. Pt told SLP that he has enough tasks to keep him busy at home and does not need more.  Plan: Patient agrees to discharge.  Patient goals were partially met. Patient is being discharged due to being pleased with the current functional level.  ?????       Problem List Patient Active Problem List   Diagnosis Date Noted  . Parkinson's disease ([place]) 02/25/2016  . Spinal stenosis of lumbar region 07/15/2015  . Tremors of nervous system 01/29/2015  . Bradycardia 11/07/2014  . Colon polyp 10/17/2014  . Mobitz type 1 second degree atrioventricular block   . Heme + stool 10/15/2014  . Symptomatic anemia 10/14/2014  . Dyspnea on  exertion 10/14/2014  . Unsteady gait 10/02/2014  . Chronic systolic CHF (congestive heart failure) ([place]) 05/12/2014  . Orthostatic lightheadedness 03/19/2013  . First degree AV block 07/25/2012  . Atherosclerosis of native arteries of the extremities with ulceration ([place]) 02/17/2012  . HYPOTENSION, UNSPECIFIED 10/08/2010  . SECOND DEGREE AV HEART BLOCK, MOBITZ I 09/04/2010  . S/P AVR 09/01/2010  . CAD, NATIVE VESSEL 06/24/2010  . Diabetes mellitus without complication ([place]) 79/89/2119  . CAD (coronary artery disease) of artery bypass graft 02/16/2010  . OBSTRUCTIVE SLEEP APNEA 07/21/2009  . Hypothyroidism 12/07/2007  . HTN (hypertension) 12/07/2007  .  Iron deficiency anemia 12/07/2007  . ELEVATED PROSTATE SPECIFIC ANTIGEN 12/07/2007    [facility] ,[place], CCC-SLP  04/23/2016, 3:29 PM  [facility] [address] [place], [place], [address] Phone: [phone]   Fax:  [phone]   Name: Stanley HALLEY MD MRN: [id number] Date of Birth: 11-Mar-1925

## 2016-04-23 NOTE — Telephone Encounter (Signed)
S/w pt's wife, advised appt chg from 5/23 to 6/1 @ 11.30am. Advised lab appt is unchanged. Pt's wife verblazed understanding.

## 2016-05-03 ENCOUNTER — Other Ambulatory Visit: Payer: Self-pay

## 2016-05-03 MED ORDER — DIPHENOXYLATE-ATROPINE 2.5-0.025 MG PO TABS: ORAL_TABLET | ORAL | Status: DC

## 2016-05-03 NOTE — Telephone Encounter (Signed)
Pt requesting a refill. Last OV 02/25/16. Last refill 08/04/15 #60 with 3 additional refills. Ok to refill?

## 2016-05-03 NOTE — Telephone Encounter (Signed)
Call in #60 with 5 rf 

## 2016-05-04 ENCOUNTER — Other Ambulatory Visit (HOSPITAL_BASED_OUTPATIENT_CLINIC_OR_DEPARTMENT_OTHER): Payer: Medicare Other

## 2016-05-04 DIAGNOSIS — D509 Iron deficiency anemia, unspecified: Secondary | ICD-10-CM | POA: Diagnosis not present

## 2016-05-04 LAB — CBC WITH DIFFERENTIAL/PLATELET
BASO%: 0.5 % (ref 0.0–2.0)
BASOS ABS: 0 10*3/uL (ref 0.0–0.1)
EOS%: 2 % (ref 0.0–7.0)
Eosinophils Absolute: 0.2 10*3/uL (ref 0.0–0.5)
HEMATOCRIT: 30.4 % — AB (ref 38.4–49.9)
HGB: 10 g/dL — ABNORMAL LOW (ref 13.0–17.1)
LYMPH#: 1.2 10*3/uL (ref 0.9–3.3)
LYMPH%: 13.7 % — AB (ref 14.0–49.0)
MCH: 30.9 pg (ref 27.2–33.4)
MCHC: 32.9 g/dL (ref 32.0–36.0)
MCV: 93.8 fL (ref 79.3–98.0)
MONO#: 1.1 10*3/uL — AB (ref 0.1–0.9)
MONO%: 12.2 % (ref 0.0–14.0)
NEUT#: 6.3 10*3/uL (ref 1.5–6.5)
NEUT%: 71.6 % (ref 39.0–75.0)
PLATELETS: 251 10*3/uL (ref 140–400)
RBC: 3.24 10*6/uL — AB (ref 4.20–5.82)
RDW: 13.9 % (ref 11.0–14.6)
WBC: 8.8 10*3/uL (ref 4.0–10.3)

## 2016-05-04 LAB — IRON AND TIBC
%SAT: 16 % — ABNORMAL LOW (ref 20–55)
Iron: 43 ug/dL (ref 42–163)
TIBC: 280 ug/dL (ref 202–409)
UIBC: 237 ug/dL (ref 117–376)

## 2016-05-04 LAB — FERRITIN: Ferritin: 274 ng/ml (ref 22–316)

## 2016-05-10 ENCOUNTER — Other Ambulatory Visit: Payer: Self-pay

## 2016-05-10 MED ORDER — GLUCOSE BLOOD VI STRP: ORAL_STRIP | Status: AC

## 2016-05-11 ENCOUNTER — Ambulatory Visit: Payer: Medicare Other | Admitting: Internal Medicine

## 2016-05-12 ENCOUNTER — Telehealth: Payer: Self-pay | Admitting: Family Medicine

## 2016-05-12 MED ORDER — HYDROCODONE-ACETAMINOPHEN 10-325 MG PO TABS: 1.0000 | ORAL_TABLET | Freq: Four times a day (QID) | ORAL | Status: DC | PRN

## 2016-05-12 NOTE — Telephone Encounter (Signed)
Script is ready for pick up and I spoke with pt.  

## 2016-05-12 NOTE — Telephone Encounter (Signed)
Refill request for Norco, pt would like to pick up on Thursday.

## 2016-05-12 NOTE — Telephone Encounter (Signed)
done

## 2016-05-14 ENCOUNTER — Telehealth: Payer: Self-pay | Admitting: Neurology

## 2016-05-14 NOTE — Telephone Encounter (Signed)
Spoke with patient. He states that he tried to get a refill on med and pharmacy told him he was 20 days early on his refill. He states he doesn't know what happened states he only has 6 pills left and has been taking med exactly as prescribed. He was questioning if the pharmacy didn't give enough pills on his last refill which he is supposed to get 450. I told him I would call his pharmacy & speak with them about filling Rx.   I spoke with the pharmacist and verified information they told the patient to be correct. I explained that patient only has 6 pills left and states that he has been taking med as prescribed and is in need on another Rx. Pharmacist told me they would give him enough pills to get him thorugh to his next refill that is due so his ins will pay for med.   I did call patient and notify him of this.

## 2016-05-14 NOTE — Telephone Encounter (Signed)
Pt needs to have his carbidopa levodopa called into [facility] on [address] pt phone number is [phone]

## 2016-05-20 ENCOUNTER — Telehealth: Payer: Self-pay | Admitting: Internal Medicine

## 2016-05-20 ENCOUNTER — Ambulatory Visit (HOSPITAL_BASED_OUTPATIENT_CLINIC_OR_DEPARTMENT_OTHER): Payer: Medicare Other | Admitting: Internal Medicine

## 2016-05-20 ENCOUNTER — Encounter: Payer: Self-pay | Admitting: Internal Medicine

## 2016-05-20 ENCOUNTER — Telehealth: Payer: Self-pay | Admitting: Medical Oncology

## 2016-05-20 VITALS — BP 134/56 | HR 62 | Temp 97.8°F | Resp 18 | Ht 68.0 in | Wt 180.0 lb

## 2016-05-20 DIAGNOSIS — R5383 Other fatigue: Secondary | ICD-10-CM | POA: Diagnosis not present

## 2016-05-20 DIAGNOSIS — D509 Iron deficiency anemia, unspecified: Secondary | ICD-10-CM

## 2016-05-20 NOTE — Telephone Encounter (Signed)
Requests to speak to Dr Julien Nordmann re iron infusion. Note to [place].

## 2016-05-20 NOTE — Progress Notes (Signed)
[facility] [phone]   [phone]  OFFICE PROGRESS NOTE  [name], MD [place] [address]  DIAGNOSIS: Iron deficiency anemia.  PRIOR THERAPY: Feraheme 510 mg IV weekly x 2, last dose was given on 10/18/2014.  CURRENT THERAPY: Integra plus 1 capsule by mouth daily.  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 The patient is feeling fine with no specific complaints except for mild fatigue. He felt much better after receiving 1 unit of PRBCs transfusion 2 months ago. The patient is currently on Integra plus 1 capsule by mouth daily and also tolerating it well. He has no significant change since his last visit. He denied having any significant chest pain, shortness of breath, cough or hemoptysis. He denied having any disease status. He has no bleeding issues. He had repeat CBC and iron study performed recently and he is here for evaluation and discussion of his lab results.  MEDICAL HISTORY: Past Medical History  Diagnosis Date  . Diabetes mellitus   . BPH (benign prostatic hyperplasia)   . Arthritis   . GERD (gastroesophageal reflux disease)   . Hypertension     pt denies  . Shortness of breath     uses cpap  . Peripheral vascular disease ([place]) 03-06-12    aortogram showed severe bilateral unreconstructible tibial artery disease  . Failed total knee, right ([place])   . H pylori ulcer     Gastric ulcer  . CAD (coronary artery disease)  s/p CABG     sees Dr. Loralie Champagne   . Aortic stenosis     S/p pericardial valve  . Mobitz type 1 second degree atrioventricular block   . Shingles 05-12-12  . Lumbar spinal stenosis     sees Dr. Sherwood Gambler   . Anemia     sees Dr. Julien Nordmann     ALLERGIES:  is allergic to morphine and related; succinylcholine; and ace inhibitors.  MEDICATIONS:  Current Outpatient Prescriptions  Medication Sig Dispense Refill  . aspirin  81 MG tablet Take 81 mg by mouth daily.    . Calcium Carbonate-Vitamin D (CALCIUM-VITAMIN D) 500-200 MG-UNIT per tablet Take 1 tablet by mouth 2 (two) times daily.     . carbidopa-levodopa (SINEMET) 25-100 MG tablet 2 in the AM, 1 in the afternoon, 2 in the evening before meals 450 tablet 3  . cephALEXin (KEFLEX) 500 MG capsule Take 1 capsule (500 mg total) by mouth 3 (three) times daily. 90 capsule 2  . chlorhexidine (PERIDEX) 0.12 % solution   0  . diclofenac (VOLTAREN) 75 MG EC tablet TAKE 1 TABLET TWICE A DAY 180 tablet 6  . FeFum-FePoly-FA-B Cmp-C-Biot (INTEGRA PLUS) CAPS TAKE 1 CAPSULE EVERY MORNING 90 capsule 3  . finasteride (PROSCAR) 5 MG tablet Take 5 mg by mouth daily.  1  . glucose blood (ONE TOUCH TEST STRIPS) test strip Dispense one touch ultra, test TID and diagnosis code is E11.9 100 each 11  . HYDROcodone-acetaminophen (NORCO) 10-325 MG tablet Take 1 tablet by mouth every 6 (six) hours as needed for severe pain. 120 tablet 0  . Lancets (ONETOUCH ULTRASOFT) lancets Test once per day and diagnosis code is E 11.9 100 each 1  . LORazepam (ATIVAN) 0.5 MG tablet Take 1 tablet (0.5 mg total) by mouth every 8 (eight) hours as needed for anxiety. 90 tablet 2  . mirabegron ER (MYRBETRIQ) 50 MG TB24  tablet Take 50 mg by mouth daily.    . Multiple Vitamin (MULITIVITAMIN WITH MINERALS) TABS Take 1 tablet by mouth daily.    . pantoprazole (PROTONIX) 40 MG tablet Take 1 tablet (40 mg total) by mouth 2 (two) times daily. 60 tablet 11  . pioglitazone (ACTOS) 45 MG tablet Take 1 tablet (45 mg total) by mouth daily. 90 tablet 1  . rosuvastatin (CRESTOR) 10 MG tablet Take 1 tablet (10 mg total) by mouth daily. 90 tablet 3  . sitaGLIPtin-metformin (JANUMET) 50-1000 MG tablet take 1 tablet by mouth twice a day WITH A MEAL. 60 tablet 5  . tamsulosin (FLOMAX) 0.4 MG CAPS capsule Take 0.4 mg by mouth daily after breakfast.     . traMADol (ULTRAM) 50 MG tablet Take 50 mg by mouth every 12 (twelve) hours as  needed.  0  . diphenoxylate-atropine (LOMOTIL) 2.5-0.025 MG tablet TAKE 1 TABLET 4 TIMES DAILY AS NEEDED FOR DIARRHEA OR LOOSE STOOLS. (Patient not taking: Reported on 05/20/2016) 60 tablet 5  . prochlorperazine (COMPAZINE) 10 MG tablet take 1 tablet by mouth four times a day as needed for nausea (Patient not taking: Reported on 05/20/2016) 30 tablet 6   No current facility-administered medications for this visit.    SURGICAL HISTORY:  Past Surgical History  Procedure Laterality Date  . Aortic valve replacement    . Foot arthrotomy Right   . Tonsillectomy    . Cataract extraction, bilateral    . Humerus fracture surgery    . Replacement total knee Bilateral   . Toe amputation      rt 2nd  . Cardiac catheterization    . Amputation  01/19/2012    Procedure: AMPUTATION DIGIT;  Surgeon: Colin Rhein, MD;  Location: [facility];  Service: Orthopedics;  Laterality: Right;  right great toe amputation through MTP joint  . Rotator cuff repair Right   . Enteroscopy N/A 10/15/2014    Procedure: ENTEROSCOPY;  Surgeon: Gatha Mayer, MD;  Location: [facility];  Service: Endoscopy;  Laterality: N/A;  . Colonoscopy with propofol N/A 10/17/2014    Procedure: COLONOSCOPY WITH PROPOFOL;  Surgeon: Gatha Mayer, MD;  Location: [facility];  Service: Endoscopy;  Laterality: N/A;  . Abdominal aortagram N/A 02/25/2012    Procedure: ABDOMINAL AORTAGRAM;  Surgeon: Elam Dutch, MD;  Location: [facility] CATH LAB;  Service: Cardiovascular;  Laterality: N/A;    REVIEW OF SYSTEMS:  A comprehensive review of systems was negative except for: Constitutional: positive for fatigue   PHYSICAL EXAMINATION: General appearance: alert, cooperative, fatigued and no distress Head: Normocephalic, without obvious abnormality, atraumatic Neck: no adenopathy, no JVD, supple, symmetrical, trachea midline and thyroid not enlarged, symmetric, no tenderness/mass/nodules Lymph nodes: Cervical, supraclavicular, and  axillary nodes normal. Resp: clear to auscultation bilaterally Back: symmetric, no curvature. ROM normal. No CVA tenderness. Cardio: Normal S1 and S2 with systolic murmur GI: soft, non-tender; bowel sounds normal; no masses,  no organomegaly Extremities: extremities normal, atraumatic, no cyanosis or edema  ECOG PERFORMANCE STATUS: 1 - Symptomatic but completely ambulatory  Blood pressure 134/56, pulse 62, temperature 97.8 F (36.6 C), temperature source Oral, resp. rate 18, height 5\' 8"  (1.727 m), weight 180 lb (81.647 kg), SpO2 98 %.  LABORATORY DATA: Lab Results  Component Value Date   WBC 8.8 05/04/2016   HGB 10.0* 05/04/2016   HCT 30.4* 05/04/2016   MCV 93.8 05/04/2016   PLT 251 05/04/2016      Chemistry  Component Value Date/Time   NA 139 02/25/2016 1213   NA 130* 10/09/2014 1123   K 4.8 02/25/2016 1213   K 5.3* 10/09/2014 1123   CL 104 02/25/2016 1213   CO2 25 02/25/2016 1213   CO2 21* 10/09/2014 1123   BUN 49* 02/25/2016 1213   BUN 31.2* 10/09/2014 1123   CREATININE 0.95 02/25/2016 1213   CREATININE 1.4* 10/09/2014 1123      Component Value Date/Time   CALCIUM 9.4 02/25/2016 1213   CALCIUM 9.3 10/09/2014 1123   ALKPHOS 71 02/25/2016 1213   ALKPHOS 70 10/09/2014 1123   AST 18 02/25/2016 1213   AST 17 10/09/2014 1123   ALT 4 02/25/2016 1213   ALT 15 10/09/2014 1123   BILITOT 0.5 02/25/2016 1213   BILITOT 0.22 10/09/2014 1123     Other lab results: Ferritin  274, serum iron  43, total iron binding capacity 280 and iron saturation  16%.  RADIOGRAPHIC STUDIES: No results found.  ASSESSMENT AND PLAN: This is a very pleasant 82 years old white male with iron deficiency anemia status post Feraheme infusion and currently on Integra plus 1 capsule by mouth daily and tolerating it fairly well. The patient is feeling well today with no specific complaints except for mild fatigue. His CBC today showed persistent mild anemia with unremarkable iron study and  ferritin.  I discussed the lab result with the patient today and recommended for him to continue on Integra plus for now. I will see him back for follow-up visit in 2 months with repeat CBC, iron study and ferritin. He was advised to call immediately if he has any concerning symptoms in the interval. The patient voices understanding of current disease status and treatment options and is in agreement with the current care plan.  All questions were answered. The patient knows to call the clinic with any problems, questions or concerns. We can certainly see the patient much sooner if necessary.  Disclaimer: This note was dictated with voice recognition software. Similar sounding words can inadvertently be transcribed and may not be corrected upon review.

## 2016-05-20 NOTE — Telephone Encounter (Signed)
Gave and printed appt sched and avs for pt for Aug °

## 2016-05-21 ENCOUNTER — Telehealth: Payer: Self-pay | Admitting: Medical Oncology

## 2016-05-21 ENCOUNTER — Telehealth: Payer: Self-pay | Admitting: Internal Medicine

## 2016-05-21 ENCOUNTER — Other Ambulatory Visit: Payer: Self-pay | Admitting: Internal Medicine

## 2016-05-21 NOTE — Telephone Encounter (Signed)
wants feraheme next week instead of waiting 2 months for a recheck -note to [facility].

## 2016-05-21 NOTE — Telephone Encounter (Signed)
per pof to sch pt appt-sent MW email to [facility] trmt-will call pt after reply

## 2016-05-24 ENCOUNTER — Telehealth: Payer: Self-pay | Admitting: *Deleted

## 2016-05-24 NOTE — Telephone Encounter (Signed)
Per staff message and POF I have scheduled appts. Advised scheduler of appts. JMW  

## 2016-05-24 NOTE — Telephone Encounter (Signed)
Pt notified of app'ts

## 2016-05-26 ENCOUNTER — Ambulatory Visit (HOSPITAL_BASED_OUTPATIENT_CLINIC_OR_DEPARTMENT_OTHER): Payer: Medicare Other

## 2016-05-26 VITALS — BP 129/43 | HR 48 | Temp 97.7°F | Resp 18

## 2016-05-26 DIAGNOSIS — D509 Iron deficiency anemia, unspecified: Secondary | ICD-10-CM

## 2016-05-26 DIAGNOSIS — D649 Anemia, unspecified: Secondary | ICD-10-CM

## 2016-05-26 MED ORDER — SODIUM CHLORIDE 0.9 % IV SOLN
510.0000 mg | Freq: Once | INTRAVENOUS | Status: AC
  Administered 2016-05-26: 510 mg via INTRAVENOUS
  Filled 2016-05-26: qty 17

## 2016-05-26 MED ORDER — SODIUM CHLORIDE 0.9 % IV SOLN
Freq: Once | INTRAVENOUS | Status: AC
  Administered 2016-05-26: 10:00:00 via INTRAVENOUS

## 2016-05-26 NOTE — Patient Instructions (Signed)

## 2016-05-29 ENCOUNTER — Observation Stay (HOSPITAL_COMMUNITY)
Admission: EM | Admit: 2016-05-29 | Discharge: 2016-05-30 | Disposition: A | Discharge status: Home / Self Care | Payer: Medicare Other | Attending: Orthopedic Surgery | Admitting: Orthopedic Surgery

## 2016-05-29 ENCOUNTER — Encounter (HOSPITAL_COMMUNITY)
Admission: EM | Disposition: A | Discharge status: Home / Self Care | Payer: Self-pay | Source: Home / Self Care | Attending: Emergency Medicine

## 2016-05-29 ENCOUNTER — Encounter (HOSPITAL_COMMUNITY): Payer: Self-pay | Admitting: Emergency Medicine

## 2016-05-29 ENCOUNTER — Other Ambulatory Visit: Payer: Self-pay

## 2016-05-29 ENCOUNTER — Emergency Department (HOSPITAL_COMMUNITY): Payer: Medicare Other

## 2016-05-29 ENCOUNTER — Emergency Department (HOSPITAL_COMMUNITY): Payer: Medicare Other | Admitting: Certified Registered Nurse Anesthetist

## 2016-05-29 DIAGNOSIS — I70201 Unspecified atherosclerosis of native arteries of extremities, right leg: Secondary | ICD-10-CM | POA: Diagnosis not present

## 2016-05-29 DIAGNOSIS — I2510 Atherosclerotic heart disease of native coronary artery without angina pectoris: Secondary | ICD-10-CM | POA: Diagnosis not present

## 2016-05-29 DIAGNOSIS — K219 Gastro-esophageal reflux disease without esophagitis: Secondary | ICD-10-CM | POA: Insufficient documentation

## 2016-05-29 DIAGNOSIS — E1151 Type 2 diabetes mellitus with diabetic peripheral angiopathy without gangrene: Secondary | ICD-10-CM | POA: Diagnosis not present

## 2016-05-29 DIAGNOSIS — I10 Essential (primary) hypertension: Secondary | ICD-10-CM | POA: Insufficient documentation

## 2016-05-29 DIAGNOSIS — Z79899 Other long term (current) drug therapy: Secondary | ICD-10-CM | POA: Diagnosis not present

## 2016-05-29 DIAGNOSIS — Z951 Presence of aortocoronary bypass graft: Secondary | ICD-10-CM | POA: Insufficient documentation

## 2016-05-29 DIAGNOSIS — Z7984 Long term (current) use of oral hypoglycemic drugs: Secondary | ICD-10-CM | POA: Insufficient documentation

## 2016-05-29 DIAGNOSIS — Z9989 Dependence on other enabling machines and devices: Secondary | ICD-10-CM | POA: Insufficient documentation

## 2016-05-29 DIAGNOSIS — Z7982 Long term (current) use of aspirin: Secondary | ICD-10-CM | POA: Diagnosis not present

## 2016-05-29 DIAGNOSIS — N400 Enlarged prostate without lower urinary tract symptoms: Secondary | ICD-10-CM | POA: Insufficient documentation

## 2016-05-29 DIAGNOSIS — R0602 Shortness of breath: Secondary | ICD-10-CM | POA: Diagnosis not present

## 2016-05-29 DIAGNOSIS — Z96659 Presence of unspecified artificial knee joint: Secondary | ICD-10-CM

## 2016-05-29 DIAGNOSIS — W19XXXA Unspecified fall, initial encounter: Secondary | ICD-10-CM | POA: Diagnosis not present

## 2016-05-29 DIAGNOSIS — Z952 Presence of prosthetic heart valve: Secondary | ICD-10-CM | POA: Insufficient documentation

## 2016-05-29 DIAGNOSIS — M1990 Unspecified osteoarthritis, unspecified site: Secondary | ICD-10-CM | POA: Insufficient documentation

## 2016-05-29 DIAGNOSIS — Z96653 Presence of artificial knee joint, bilateral: Secondary | ICD-10-CM | POA: Insufficient documentation

## 2016-05-29 DIAGNOSIS — Z89411 Acquired absence of right great toe: Secondary | ICD-10-CM | POA: Insufficient documentation

## 2016-05-29 DIAGNOSIS — T84022A Instability of internal right knee prosthesis, initial encounter: Principal | ICD-10-CM | POA: Insufficient documentation

## 2016-05-29 DIAGNOSIS — S83104A Unspecified dislocation of right knee, initial encounter: Secondary | ICD-10-CM

## 2016-05-29 DIAGNOSIS — Y792 Prosthetic and other implants, materials and accessory orthopedic devices associated with adverse incidents: Secondary | ICD-10-CM | POA: Insufficient documentation

## 2016-05-29 DIAGNOSIS — S83106A Unspecified dislocation of unspecified knee, initial encounter: Secondary | ICD-10-CM

## 2016-05-29 HISTORY — PX: KNEE CLOSED REDUCTION: SHX995

## 2016-05-29 LAB — I-STAT CHEM 8, ED
BUN: 42 mg/dL — AB (ref 6–20)
CREATININE: 0.9 mg/dL (ref 0.61–1.24)
Calcium, Ion: 1.18 mmol/L (ref 1.13–1.30)
Chloride: 104 mmol/L (ref 101–111)
GLUCOSE: 227 mg/dL — AB (ref 65–99)
HEMATOCRIT: 32 % — AB (ref 39.0–52.0)
HEMOGLOBIN: 10.9 g/dL — AB (ref 13.0–17.0)
POTASSIUM: 5.2 mmol/L — AB (ref 3.5–5.1)
Sodium: 135 mmol/L (ref 135–145)
TCO2: 20 mmol/L (ref 0–100)

## 2016-05-29 LAB — TYPE AND SCREEN
ABO/RH(D): O POS
Antibody Screen: NEGATIVE

## 2016-05-29 LAB — GLUCOSE, CAPILLARY: Glucose-Capillary: 178 mg/dL — ABNORMAL HIGH (ref 65–99)

## 2016-05-29 SURGERY — MANIPULATION, KNEE, CLOSED
Anesthesia: General

## 2016-05-29 MED ORDER — CEPHALEXIN 500 MG PO CAPS
500.0000 mg | ORAL_CAPSULE | Freq: Three times a day (TID) | ORAL | Status: DC
  Filled 2016-05-29 (×2): qty 1

## 2016-05-29 MED ORDER — FINASTERIDE 5 MG PO TABS
5.0000 mg | ORAL_TABLET | Freq: Every day | ORAL | Status: DC
  Administered 2016-05-30: 5 mg via ORAL
  Filled 2016-05-29: qty 1

## 2016-05-29 MED ORDER — BISACODYL 5 MG PO TBEC: 5.0000 mg | DELAYED_RELEASE_TABLET | Freq: Every day | ORAL | Status: DC | PRN

## 2016-05-29 MED ORDER — INSULIN ASPART 100 UNIT/ML ~~LOC~~ SOLN
0.0000 [IU] | Freq: Three times a day (TID) | SUBCUTANEOUS | Status: DC
  Administered 2016-05-30 (×2): 3 [IU] via SUBCUTANEOUS

## 2016-05-29 MED ORDER — ROCURONIUM BROMIDE 100 MG/10ML IV SOLN
INTRAVENOUS | Status: AC
  Filled 2016-05-29: qty 1

## 2016-05-29 MED ORDER — LACTATED RINGERS IV SOLN
INTRAVENOUS | Status: DC
  Administered 2016-05-30: 02:00:00 via INTRAVENOUS

## 2016-05-29 MED ORDER — CARBIDOPA-LEVODOPA 25-100 MG PO TABS
2.0000 | ORAL_TABLET | Freq: Two times a day (BID) | ORAL | Status: DC
  Administered 2016-05-30 (×2): 2 via ORAL
  Filled 2016-05-29: qty 2

## 2016-05-29 MED ORDER — ACETAMINOPHEN 325 MG PO TABS: 650.0000 mg | ORAL_TABLET | Freq: Four times a day (QID) | ORAL | Status: DC | PRN

## 2016-05-29 MED ORDER — ONDANSETRON HCL 4 MG/2ML IJ SOLN
4.0000 mg | Freq: Four times a day (QID) | INTRAMUSCULAR | Status: DC | PRN
  Administered 2016-05-30: 4 mg via INTRAVENOUS
  Filled 2016-05-29: qty 2

## 2016-05-29 MED ORDER — METHOCARBAMOL 1000 MG/10ML IJ SOLN
500.0000 mg | Freq: Four times a day (QID) | INTRAVENOUS | Status: DC | PRN
  Administered 2016-05-29: 500 mg via INTRAVENOUS
  Filled 2016-05-29: qty 550
  Filled 2016-05-29: qty 5

## 2016-05-29 MED ORDER — LORAZEPAM 0.5 MG PO TABS
0.5000 mg | ORAL_TABLET | Freq: Three times a day (TID) | ORAL | Status: DC | PRN
Start: 2016-05-29 — End: 2016-05-30
  Administered 2016-05-29: 0.5 mg via ORAL
  Filled 2016-05-29: qty 1

## 2016-05-29 MED ORDER — CARBIDOPA-LEVODOPA 25-100 MG PO TABS: 1.0000 | ORAL_TABLET | ORAL | Status: DC

## 2016-05-29 MED ORDER — METHOCARBAMOL 500 MG PO TABS: 500.0000 mg | ORAL_TABLET | Freq: Four times a day (QID) | ORAL | Status: DC | PRN

## 2016-05-29 MED ORDER — ASPIRIN 81 MG PO TABS: 81.0000 mg | ORAL_TABLET | Freq: Every day | ORAL | Status: DC

## 2016-05-29 MED ORDER — ROSUVASTATIN CALCIUM 5 MG PO TABS
10.0000 mg | ORAL_TABLET | Freq: Every day | ORAL | Status: DC
  Administered 2016-05-30: 10 mg via ORAL
  Filled 2016-05-29: qty 2

## 2016-05-29 MED ORDER — POLYETHYLENE GLYCOL 3350 17 G PO PACK: 17.0000 g | PACK | Freq: Every day | ORAL | Status: DC | PRN

## 2016-05-29 MED ORDER — PROCHLORPERAZINE MALEATE 10 MG PO TABS
10.0000 mg | ORAL_TABLET | Freq: Four times a day (QID) | ORAL | Status: DC | PRN
  Filled 2016-05-29: qty 1

## 2016-05-29 MED ORDER — ONDANSETRON HCL 4 MG PO TABS: 4.0000 mg | ORAL_TABLET | Freq: Four times a day (QID) | ORAL | Status: DC | PRN

## 2016-05-29 MED ORDER — CARBIDOPA-LEVODOPA 25-100 MG PO TABS
1.0000 | ORAL_TABLET | Freq: Every day | ORAL | Status: DC
  Filled 2016-05-29: qty 2

## 2016-05-29 MED ORDER — TAMSULOSIN HCL 0.4 MG PO CAPS
0.4000 mg | ORAL_CAPSULE | Freq: Every day | ORAL | Status: DC
  Administered 2016-05-30: 0.4 mg via ORAL
  Filled 2016-05-29: qty 1

## 2016-05-29 MED ORDER — SODIUM CHLORIDE 0.9 % IV BOLUS (SEPSIS)
500.0000 mL | Freq: Once | INTRAVENOUS | Status: AC
  Administered 2016-05-29: 500 mL via INTRAVENOUS

## 2016-05-29 MED ORDER — LACTATED RINGERS IV SOLN
INTRAVENOUS | Status: DC | PRN
  Administered 2016-05-29: 19:00:00 via INTRAVENOUS

## 2016-05-29 MED ORDER — HYDROCODONE-ACETAMINOPHEN 10-325 MG PO TABS
1.0000 | ORAL_TABLET | Freq: Four times a day (QID) | ORAL | Status: DC | PRN
  Administered 2016-05-29 – 2016-05-30 (×3): 1 via ORAL
  Filled 2016-05-29 (×3): qty 1

## 2016-05-29 MED ORDER — ROCURONIUM BROMIDE 100 MG/10ML IV SOLN
INTRAVENOUS | Status: DC | PRN
Start: 2016-05-29 — End: 2016-05-29
  Administered 2016-05-29: 30 mg via INTRAVENOUS

## 2016-05-29 MED ORDER — FLEET ENEMA 7-19 GM/118ML RE ENEM: 1.0000 | ENEMA | Freq: Once | RECTAL | Status: DC | PRN

## 2016-05-29 MED ORDER — MIRABEGRON ER 50 MG PO TB24
50.0000 mg | ORAL_TABLET | Freq: Every day | ORAL | Status: DC
  Administered 2016-05-30: 50 mg via ORAL
  Filled 2016-05-29: qty 1

## 2016-05-29 MED ORDER — LIDOCAINE HCL (CARDIAC) 20 MG/ML IV SOLN
INTRAVENOUS | Status: DC | PRN
  Administered 2016-05-29: 40 mg via INTRAVENOUS

## 2016-05-29 MED ORDER — SUGAMMADEX SODIUM 500 MG/5ML IV SOLN
INTRAVENOUS | Status: AC
  Filled 2016-05-29: qty 5

## 2016-05-29 MED ORDER — ACETAMINOPHEN 650 MG RE SUPP: 650.0000 mg | Freq: Four times a day (QID) | RECTAL | Status: DC | PRN

## 2016-05-29 MED ORDER — PROPOFOL 10 MG/ML IV BOLUS
INTRAVENOUS | Status: AC
  Filled 2016-05-29: qty 20

## 2016-05-29 MED ORDER — ALUM & MAG HYDROXIDE-SIMETH 200-200-20 MG/5ML PO SUSP: 30.0000 mL | ORAL | Status: DC | PRN

## 2016-05-29 MED ORDER — SUGAMMADEX SODIUM 200 MG/2ML IV SOLN
INTRAVENOUS | Status: DC | PRN
Start: 2016-05-29 — End: 2016-05-29
  Administered 2016-05-29: 500 mg via INTRAVENOUS

## 2016-05-29 MED ORDER — PROPOFOL 10 MG/ML IV BOLUS
INTRAVENOUS | Status: DC | PRN
  Administered 2016-05-29: 80 mg via INTRAVENOUS

## 2016-05-29 MED ORDER — ASPIRIN EC 325 MG PO TBEC
325.0000 mg | DELAYED_RELEASE_TABLET | Freq: Every day | ORAL | Status: DC
  Administered 2016-05-30: 325 mg via ORAL
  Filled 2016-05-29 (×2): qty 1

## 2016-05-29 MED ORDER — FENTANYL CITRATE (PF) 100 MCG/2ML IJ SOLN: 25.0000 ug | INTRAMUSCULAR | Status: DC | PRN

## 2016-05-29 MED ORDER — TRAMADOL HCL 50 MG PO TABS: 50.0000 mg | ORAL_TABLET | Freq: Two times a day (BID) | ORAL | Status: DC | PRN

## 2016-05-29 MED ORDER — MENTHOL 3 MG MT LOZG: 1.0000 | LOZENGE | OROMUCOSAL | Status: DC | PRN

## 2016-05-29 MED ORDER — ONDANSETRON HCL 4 MG/2ML IJ SOLN
4.0000 mg | Freq: Once | INTRAMUSCULAR | Status: AC | PRN
Start: 2016-05-29 — End: 2016-05-29
  Administered 2016-05-29: 4 mg via INTRAVENOUS

## 2016-05-29 MED ORDER — PANTOPRAZOLE SODIUM 40 MG PO TBEC
40.0000 mg | DELAYED_RELEASE_TABLET | Freq: Two times a day (BID) | ORAL | Status: DC
  Administered 2016-05-30: 40 mg via ORAL
  Filled 2016-05-29: qty 1

## 2016-05-29 MED ORDER — PHENOL 1.4 % MT LIQD: 1.0000 | OROMUCOSAL | Status: DC | PRN

## 2016-05-29 MED ORDER — FENTANYL CITRATE (PF) 100 MCG/2ML IJ SOLN
INTRAMUSCULAR | Status: AC
  Filled 2016-05-29: qty 2

## 2016-05-29 MED ORDER — CEFAZOLIN SODIUM 1-5 GM-% IV SOLN
1.0000 g | Freq: Four times a day (QID) | INTRAVENOUS | Status: AC
  Administered 2016-05-29 – 2016-05-30 (×2): 1 g via INTRAVENOUS
  Filled 2016-05-29 (×2): qty 50

## 2016-05-29 SURGICAL SUPPLY — 15 items
BANDAGE ADH SHEER 1  50/CT (GAUZE/BANDAGES/DRESSINGS) ×3 IMPLANT
GAUZE SPONGE 4X4 12PLY STRL (GAUZE/BANDAGES/DRESSINGS) ×3 IMPLANT
GLOVE BIOGEL PI IND STRL 8 (GLOVE) ×1 IMPLANT
GLOVE BIOGEL PI INDICATOR 8 (GLOVE) ×2
GLOVE ECLIPSE 8.0 STRL XLNG CF (GLOVE) ×6 IMPLANT
GOWN STRL REUS W/TWL LRG LVL3 (GOWN DISPOSABLE) ×9 IMPLANT
GOWN STRL REUS W/TWL XL LVL3 (GOWN DISPOSABLE) ×6 IMPLANT
IMMOBILIZER KNEE 20 (SOFTGOODS) ×2 IMPLANT
MANIFOLD NEPTUNE II (INSTRUMENTS) ×3 IMPLANT
NDL SAFETY ECLIPSE 18X1.5 (NEEDLE) ×1 IMPLANT
NEEDLE HYPO 18GX1.5 SHARP (NEEDLE) ×3
POSITIONER SURGICAL ARM (MISCELLANEOUS) ×3 IMPLANT
SOL PREP POV-IOD 4OZ 10% (MISCELLANEOUS) ×3 IMPLANT
SYR CONTROL 10ML LL (SYRINGE) ×3 IMPLANT
TOWEL OR 17X26 10 PK STRL BLUE (TOWEL DISPOSABLE) ×6 IMPLANT

## 2016-05-29 NOTE — Interval H&P Note (Signed)
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 After consideration of risks, benefits and other options for treatment, the patient has consented to  Procedure(s): CLOSED MANIPULATION KNEE (N/A) as a surgical intervention .  The patient's history has been reviewed, patient examined, no change in status, stable for surgery.  I have reviewed the patient's chart and labs.  Questions were answered to the patient's satisfaction.     [name] A

## 2016-05-29 NOTE — ED Provider Notes (Signed)
CSN: [id number]     Arrival date & time 05/29/16  1458 History   First MD Initiated Contact with Patient 05/29/16 1536     Chief Complaint  Patient presents with  . Fall  . Knee Injury     (Consider location/radiation/quality/duration/timing/severity/associated sxs/prior Treatment) Patient is a 82 y.o. male presenting with fall. The history is provided by the patient (Patient states that he fell on his right knee and it feels like his dislocated that).  Fall This is a new problem. The current episode started 1 to 2 hours ago. The problem occurs rarely. The problem has been resolved. Pertinent negatives include no chest pain, no abdominal pain and no headaches. Exacerbated by: Movement of leg. Nothing relieves the symptoms.    Past Medical History  Diagnosis Date  . Diabetes mellitus   . BPH (benign prostatic hyperplasia)   . Arthritis   . GERD (gastroesophageal reflux disease)   . Hypertension     pt denies  . Shortness of breath     uses cpap  . Peripheral vascular disease ([place]) 03-06-12    aortogram showed severe bilateral unreconstructible tibial artery disease  . Failed total knee, right ([place])   . H pylori ulcer     Gastric ulcer  . CAD (coronary artery disease)  s/p CABG     sees Dr. Loralie Champagne   . Aortic stenosis     S/p pericardial valve  . Mobitz type 1 second degree atrioventricular block   . Shingles 05-12-12  . Lumbar spinal stenosis     sees Dr. Sherwood Gambler   . Anemia     sees Dr. Julien Nordmann    Past Surgical History  Procedure Laterality Date  . Aortic valve replacement    . Foot arthrotomy Right   . Tonsillectomy    . Cataract extraction, bilateral    . Humerus fracture surgery    . Replacement total knee Bilateral   . Toe amputation      rt 2nd  . Cardiac catheterization    . Amputation  01/19/2012    Procedure: AMPUTATION DIGIT;  Surgeon: Colin Rhein, MD;  Location: [facility];  Service: Orthopedics;  Laterality: Right;  right great toe  amputation through MTP joint  . Rotator cuff repair Right   . Enteroscopy N/A 10/15/2014    Procedure: ENTEROSCOPY;  Surgeon: Gatha Mayer, MD;  [facility];  Service: Endoscopy;  Laterality: N/A;  . Colonoscopy with propofol N/A 10/17/2014    Procedure: COLONOSCOPY WITH PROPOFOL;  Surgeon: Gatha Mayer, MD;  Location: [facility];  Service: Endoscopy;  Laterality: N/A;  . Abdominal aortagram N/A 02/25/2012    Procedure: ABDOMINAL AORTAGRAM;  Surgeon: Elam Dutch, MD;  Location: [facility] CATH LAB;  Service: Cardiovascular;  Laterality: N/A;   Family History  Problem Relation Age of Onset  . Diabetes type II Mother   . Diabetes type II Father   . Colon cancer Neg Hx   . Esophageal cancer Neg Hx   . Rectal cancer Neg Hx   . Stomach cancer Neg Hx   . Heart attack Neg Hx   . Stroke Neg Hx   . Hypertension Mother   . Hypertension Father   . Diabetes Father    Social History  Substance Use Topics  . Smoking status: Never Smoker   . Smokeless tobacco: Never Used  . Alcohol Use: 4.2 oz/week    7 Glasses of wine per week     Comment: glass  of wine or vodka/tonic per night    Review of Systems  Constitutional: Negative for appetite change and fatigue.  HENT: Negative for congestion, ear discharge and sinus pressure.   Eyes: Negative for discharge.  Respiratory: Negative for cough.   Cardiovascular: Negative for chest pain.  Gastrointestinal: Negative for abdominal pain and diarrhea.  Genitourinary: Negative for frequency and hematuria.  Musculoskeletal: Negative for back pain.       Patient's right knee is deformed  Skin: Negative for rash.  Neurological: Negative for seizures and headaches.  Psychiatric/Behavioral: Negative for hallucinations.      Allergies  Morphine and related; Succinylcholine; and Ace inhibitors  Home Medications   Prior to Admission medications   Medication Sig Start Date End Date Taking? Authorizing Provider  aspirin 81 MG tablet Take  81 mg by mouth daily.   Yes Historical Provider, MD  Calcium Carbonate-Vitamin D (CALCIUM-VITAMIN D) 500-200 MG-UNIT per tablet Take 1 tablet by mouth 2 (two) times daily.    Yes Historical Provider, MD  carbidopa-levodopa (SINEMET) 25-100 MG tablet 2 in the AM, 1 in the afternoon, 2 in the evening before meals Patient taking differently: Take 1-2 tablets by mouth See admin instructions. 2 in the AM, 1 in the afternoon, 2 in the evening before meals 11/11/15  Yes Rebecca S Tat, DO  cephALEXin (KEFLEX) 500 MG capsule Take 1 capsule (500 mg total) by mouth 3 (three) times daily. 04/05/16  Yes [name], MD  chlorhexidine (PERIDEX) 0.12 % solution Use as directed 15 mLs in the mouth or throat 2 (two) times daily.  05/14/16  Yes Historical Provider, MD  diclofenac (VOLTAREN) 75 MG EC tablet TAKE 1 TABLET TWICE A DAY 03/25/16  Yes [name], MD  FeFum-FePoly-FA-B Cmp-C-Biot (INTEGRA PLUS) CAPS TAKE 1 CAPSULE EVERY MORNING 01/12/16  Yes Curt Bears, MD  finasteride (PROSCAR) 5 MG tablet Take 5 mg by mouth daily. 05/02/15  Yes Historical Provider, MD  HYDROcodone-acetaminophen (NORCO) 10-325 MG tablet Take 1 tablet by mouth every 6 (six) hours as needed for severe pain. 05/12/16  Yes [name], MD  LORazepam (ATIVAN) 0.5 MG tablet Take 1 tablet (0.5 mg total) by mouth every 8 (eight) hours as needed for anxiety. 03/19/16  Yes [name], MD  mirabegron ER (MYRBETRIQ) 50 MG TB24 tablet Take 50 mg by mouth daily.   Yes Historical Provider, MD  Multiple Vitamin (MULITIVITAMIN WITH MINERALS) TABS Take 1 tablet by mouth daily.   Yes Historical Provider, MD  pantoprazole (PROTONIX) 40 MG tablet Take 1 tablet (40 mg total) by mouth 2 (two) times daily. 01/29/16  Yes Amy S Esterwood, PA-C  pioglitazone (ACTOS) 45 MG tablet Take 1 tablet (45 mg total) by mouth daily. 01/28/16  Yes [name], MD  rosuvastatin (CRESTOR) 10 MG tablet Take 1 tablet (10 mg total) by mouth daily. 11/24/15  Yes [name], MD   sitaGLIPtin-metformin (JANUMET) 50-1000 MG tablet take 1 tablet by mouth twice a day WITH A MEAL. Patient taking differently: Take 1 tablet by mouth 2 (two) times daily with a meal. take 1 tablet by mouth twice a day WITH A MEAL. 03/08/16  Yes [name], MD  tamsulosin (FLOMAX) 0.4 MG CAPS capsule Take 0.4 mg by mouth daily after breakfast.  09/04/14  Yes Historical Provider, MD  traMADol (ULTRAM) 50 MG tablet Take 50 mg by mouth every 12 (twelve) hours as needed for moderate pain.  05/14/16  Yes Historical Provider, MD  diphenoxylate-atropine (  LOMOTIL) 2.5-0.025 MG tablet TAKE 1 TABLET 4 TIMES DAILY AS NEEDED FOR DIARRHEA OR LOOSE STOOLS. Patient not taking: Reported on 05/20/2016 05/03/16   [name], MD  glucose blood (ONE TOUCH TEST STRIPS) test strip Dispense one touch ultra, test TID and diagnosis code is E11.9 05/10/16   [name], MD  Lancets [facility] ULTRASOFT) lancets Test once per day and diagnosis code is E 11.9 12/03/15   [name], MD  prochlorperazine (COMPAZINE) 10 MG tablet take 1 tablet by mouth four times a day as needed for nausea Patient not taking: Reported on 05/20/2016 01/29/16   [name], MD   BP 130/70 mmHg  Pulse 115  Temp(Src) 98.3 F (36.8 C) (Oral)  Resp 16  SpO2 100% Physical Exam  Constitutional: He is oriented to person, place, and time. He appears well-developed.  HENT:  Head: Normocephalic.  Eyes: Conjunctivae are normal.  Neck: No tracheal deviation present.  Cardiovascular:  No murmur heard. Musculoskeletal:  Right knee appears dislocated. Patient has a 2+ dorsalis pedis pulse  Neurological: He is oriented to person, place, and time.  Skin: Skin is warm.  Psychiatric: He has a normal mood and affect.    ED Course  Procedures (including critical care time) Labs Review Labs Reviewed  I-STAT CHEM 8, ED - Abnormal; Notable for the following:    Potassium 5.2 (*)    BUN 42 (*)    Glucose, Bld 227 (*)    Hemoglobin 10.9 (*)    HCT  32.0 (*)    All other components within normal limits    Imaging Review Dg Knee Complete 4 Views Right  05/29/2016  CLINICAL DATA:  Previous knee replacement. [place] today with sensation of dislocation. EXAM: RIGHT KNEE - COMPLETE 4+ VIEW COMPARISON:  None. FINDINGS: There is dislocation of the femoral component anteriorly relative to the tibial component. No acute fracture evident. IMPRESSION: Anterior dislocation of the femoral component relative to the tibial component. Electronically Signed   By: Nelson Chimes M.D.   On: 05/29/2016 16:18   I have personally reviewed and evaluated these images and lab results as part of my medical decision-making.   EKG Interpretation None      MDM   Final diagnoses:  None   Patient with dislocated right knee he will be seen by orthopedics     Milton Ferguson, MD 05/29/16 1820

## 2016-05-29 NOTE — Anesthesia Preprocedure Evaluation (Addendum)
Anesthesia Evaluation  Patient identified by MRN, date of birth, ID band Patient awake    Reviewed: Allergy & Precautions, NPO status , Patient's Chart, lab work & pertinent test results  Airway Mallampati: II  TM Distance: >3 FB Neck ROM: Full    Dental  (+) Dental Advisory Given, Partial Upper, Partial Lower   Pulmonary    breath sounds clear to auscultation       Cardiovascular hypertension,  Rhythm:Regular Rate:Normal     Neuro/Psych    GI/Hepatic   Endo/Other  diabetes  Renal/GU      Musculoskeletal   Abdominal   Peds  Hematology   Anesthesia Other Findings   Reproductive/Obstetrics                            Anesthesia Physical Anesthesia Plan  ASA: III  Anesthesia Plan: General   Post-op Pain Management:    Induction: Intravenous  Airway Management Planned: Oral ETT  Additional Equipment:   Intra-op Plan:   Post-operative Plan: Extubation in OR  Informed Consent: I have reviewed the patients History and Physical, chart, labs and discussed the procedure including the risks, benefits and alternatives for the proposed anesthesia with the patient or authorized representative who has indicated his/her understanding and acceptance.   Dental advisory given  Plan Discussed with: CRNA and Anesthesiologist  Anesthesia Plan Comments:         Anesthesia Quick Evaluation

## 2016-05-29 NOTE — Transfer of Care (Signed)
Immediate Anesthesia Transfer of Care Note  Patient: Stanley Comas MD  Procedure(s) Performed: Procedure(s): CLOSED MANIPULATION KNEE (N/A)  Patient Location: PACU  Anesthesia Type:General  Level of Consciousness:  sedated, patient cooperative and responds to stimulation  Airway & Oxygen Therapy:Patient Spontanous Breathing and Patient connected to face mask oxgen  Post-op Assessment:  Report given to PACU RN and Post -op Vital signs reviewed and stable  Post vital signs:  Reviewed and stable  Last Vitals:  Filed Vitals:   05/29/16 1822 05/29/16 1947  BP: 158/69 155/62  Pulse: 56 47  Temp: 36.6 C 37.1 C  Resp: 20 19    Complications: No apparent anesthesia complications

## 2016-05-29 NOTE — ED Notes (Signed)
Pt fell today while hanging a towel up on the wall. Hx of knee replacements, has a brace on right knee. States he feels like his knee went out of place when he fell.

## 2016-05-29 NOTE — Brief Op Note (Signed)
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   POST-OPERATIVE DIAGNOSIS:Same as Pre-Op  PROCEDURE:  Procedure(s): CLOSED MANIPULATION KNEE (N/A) of Right Total Knee  SURGEON:  Surgeon(s) and Role:    * Latanya Maudlin, MD - Primary  PHYSICIAN ASSISTANT: Wyatt Portela PA  ASSISTANTS: Wyatt Portela PA  ANESTHESIA:   general  EBL:     BLOOD ADMINISTERED:none  DRAINS: none   LOCAL MEDICATIONS USED:  NONE  SPECIMEN:  No Specimen  DISPOSITION OF SPECIMEN:  N/A  COUNTS:  YES  TOURNIQUET:  * No tourniquets in log *  DICTATION: .Other Dictation: Dictation Number [id number]  PLAN OF CARE: Admit for overnight observation  PATIENT DISPOSITION:  PACU - hemodynamically stable.   Delay start of Pharmacological VTE agent (>24hrs) due to surgical blood loss or risk of bleeding: yes

## 2016-05-29 NOTE — Anesthesia Procedure Notes (Signed)
Procedure Name: Intubation Date/Time: 05/29/2016 7:20 PM Performed by: West Pugh Pre-anesthesia Checklist: Patient identified, Emergency Drugs available, Suction available, Patient being monitored and Timeout performed Patient Re-evaluated:Patient Re-evaluated prior to inductionOxygen Delivery Method: Circle system utilized Preoxygenation: Pre-oxygenation with 100% oxygen Intubation Type: IV induction Ventilation: Mask ventilation without difficulty Laryngoscope Size: Mac and 4 Grade View: Grade I Tube type: Oral Tube size: 7.5 mm Number of attempts: 1 Airway Equipment and Method: Stylet Placement Confirmation: ETT inserted through vocal cords under direct vision,  positive ETCO2,  CO2 detector and breath sounds checked- equal and bilateral Secured at: 21 cm Tube secured with: Tape Dental Injury: Teeth and Oropharynx as per pre-operative assessment

## 2016-05-29 NOTE — Anesthesia Postprocedure Evaluation (Signed)
Anesthesia Post Note  Patient: Stanley Comas MD  Procedure(s) Performed: Procedure(s) (LRB): CLOSED MANIPULATION KNEE (N/A)  Patient location during evaluation: PACU Anesthesia Type: General Level of consciousness: awake, awake and alert and oriented Pain management: pain level controlled Vital Signs Assessment: post-procedure vital signs reviewed and stable Respiratory status: spontaneous breathing, nonlabored ventilation and respiratory function stable Cardiovascular status: blood pressure returned to baseline Anesthetic complications: no    Last Vitals:  Filed Vitals:   05/29/16 1947 05/29/16 2002  BP: 155/62 133/95  Pulse: 47 43  Temp: 37.1 C   Resp: 19 16    Last Pain:  Filed Vitals:   05/29/16 2007  PainSc: 0-No pain                 Stanley [name]

## 2016-05-29 NOTE — H&P (Signed)
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   Chief Complaint:  Knee pain HPI: Patient fell today in his bathroom. Immediate right knee pain and dysfunction. EMS to [facility]. Right knee dislocation. History of bilateral TKA by Dr. Collie Siad and ALuisio in the early 2000's. Right patella tendon rupture history. Denies CP, SOB, or calf pain. No changes in neuro status.  Past Medical History  Diagnosis Date  . Diabetes mellitus   . BPH (benign prostatic hyperplasia)   . Arthritis   . GERD (gastroesophageal reflux disease)   . Hypertension     pt denies  . Shortness of breath     uses cpap  . Peripheral vascular disease ([place]) 03-06-12    aortogram showed severe bilateral unreconstructible tibial artery disease  . Failed total knee, right ([place])   . H pylori ulcer     Gastric ulcer  . CAD (coronary artery disease)  s/p CABG     sees Dr. Loralie Champagne   . Aortic stenosis     S/p pericardial valve  . Mobitz type 1 second degree atrioventricular block   . Shingles 05-12-12  . Lumbar spinal stenosis     sees Dr. Sherwood Gambler   . Anemia     sees Dr. Julien Nordmann     Past Surgical History  Procedure Laterality Date  . Aortic valve replacement    . Foot arthrotomy Right   . Tonsillectomy    . Cataract extraction, bilateral    . Humerus fracture surgery    . Replacement total knee Bilateral   . Toe amputation      rt 2nd  . Cardiac catheterization    . Amputation  01/19/2012    Procedure: AMPUTATION DIGIT;  Surgeon: Colin Rhein, MD;  Location: [facility];  Service: Orthopedics;  Laterality: Right;  right great toe amputation through MTP joint  . Rotator cuff repair Right   . Enteroscopy N/A 10/15/2014    Procedure: ENTEROSCOPY;  Surgeon: Gatha Mayer, MD;  [facility];  Service: Endoscopy;  Laterality: N/A;  . Colonoscopy with propofol N/A 10/17/2014    Procedure: COLONOSCOPY WITH PROPOFOL;  Surgeon: Gatha Mayer, MD;  Location: [facility];  Service: Endoscopy;   Laterality: N/A;  . Abdominal aortagram N/A 02/25/2012    Procedure: ABDOMINAL AORTAGRAM;  Surgeon: Elam Dutch, MD;  Location: [facility] CATH LAB;  Service: Cardiovascular;  Laterality: N/A;    Family History  Problem Relation Age of Onset  . Diabetes type II Mother   . Diabetes type II Father   . Colon cancer Neg Hx   . Esophageal cancer Neg Hx   . Rectal cancer Neg Hx   . Stomach cancer Neg Hx   . Heart attack Neg Hx   . Stroke Neg Hx   . Hypertension Mother   . Hypertension Father   . Diabetes Father    Social History:  reports that he has never smoked. He has never used smokeless tobacco. He reports that he drinks about 4.2 oz of alcohol per week. He reports that he does not use illicit drugs.  Allergies:  Allergies  Allergen Reactions  . Morphine And Related Nausea And Vomiting  . Succinylcholine Other (See Comments)    unknown  . Ace Inhibitors Other (See Comments)    cough     (Not in a hospital admission)  Results for orders placed or performed during the hospital encounter of 05/29/16 (from the past 48 hour(s))  I-stat chem 8,  ed     Status: Abnormal   Collection Time: 05/29/16  6:13 PM  Result Value Ref Range   Sodium 135 135 - 145 mmol/L   Potassium 5.2 (H) 3.5 - 5.1 mmol/L   Chloride 104 101 - 111 mmol/L   BUN 42 (H) 6 - 20 mg/dL   Creatinine, Ser 0.90 0.61 - 1.24 mg/dL   Glucose, Bld 227 (H) 65 - 99 mg/dL   Calcium, Ion 1.18 1.13 - 1.30 mmol/L   TCO2 20 0 - 100 mmol/L   Hemoglobin 10.9 (L) 13.0 - 17.0 g/dL   HCT 32.0 (L) 39.0 - 52.0 %   Dg Knee Complete 4 Views Right  05/29/2016  CLINICAL DATA:  Previous knee replacement. [place] today with sensation of dislocation. EXAM: RIGHT KNEE - COMPLETE 4+ VIEW COMPARISON:  None. FINDINGS: There is dislocation of the femoral component anteriorly relative to the tibial component. No acute fracture evident. IMPRESSION: Anterior dislocation of the femoral component relative to the tibial component. Electronically Signed    By: Nelson Chimes M.D.   On: 05/29/2016 16:18    Review of Systems  Constitutional: Negative.   HENT: Negative.   Eyes: Negative.   Respiratory: Negative.   Gastrointestinal: Negative.   Genitourinary: Negative.   Musculoskeletal: Positive for joint pain and falls.  Skin: Negative.   Neurological: Negative.   Endo/Heme/Allergies: Negative.   Psychiatric/Behavioral: Negative.     Blood pressure 158/69, pulse 56, temperature 97.8 F (36.6 C), temperature source Oral, resp. rate 20, SpO2 98 %. Physical Exam  Constitutional: He is oriented to person, place, and time. He appears well-developed.  HENT:  Head: Normocephalic.  Eyes: EOM are normal. Pupils are equal, round, and reactive to light.  Neck: Normal range of motion.  Cardiovascular: Normal rate.   GI: Soft.  Genitourinary:  Deferred  Musculoskeletal:  Right knee pain and edema.plantar and doris flexion intact. weak pedal pulse and weak posterior tibalis. Will recheck with doppler.   Neurological: He is alert and oriented to person, place, and time.  Skin: Skin is warm and dry.  Psychiatric: His behavior is normal.     Assessment/Plan Right TKA dislocation: Plan to OR for closed reduction and possible open reduction of tKA OBS tonight [place] N/V status Further orders to follow post-op Master Touchet L, PA-C 05/29/2016, 6:42 PM

## 2016-05-29 NOTE — ED Notes (Signed)
Pt transported to DG.  

## 2016-05-30 DIAGNOSIS — T84022A Instability of internal right knee prosthesis, initial encounter: Secondary | ICD-10-CM | POA: Diagnosis not present

## 2016-05-30 LAB — GLUCOSE, CAPILLARY
GLUCOSE-CAPILLARY: 185 mg/dL — AB (ref 65–99)
Glucose-Capillary: 197 mg/dL — ABNORMAL HIGH (ref 65–99)

## 2016-05-30 NOTE — Discharge Instructions (Signed)
Continue knee immobilizer Weight-bearing as tolerated with walker Call if any temperatures greater than 101 or any wound complications: [phone] during the day and ask for Dr. Charlestine Night nurse, Brunilda Payor.

## 2016-05-30 NOTE — Care Management Note (Signed)
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 Offered choice for [facility]. States he had HH in the past but declines this admission. States he has RW at home. Lives at home with wife.   PCP Alysia Penna MD Expected Discharge Date:  05/30/2016               Expected Discharge Plan:  Home/Self Care  In-House Referral:  NA  Discharge planning Services  CM Consult  Post Acute Care Choice:  Home Health Choice offered to:  Patient  DME Arranged:  N/A DME Agency:  NA  HH Arranged:  Patient Refused [place] Agency:  NA  Status of Service:  Completed, signed off  Medicare Important Message Given:    Date Medicare IM Given:    Medicare IM give by:    Date Additional Medicare IM Given:    Additional Medicare Important Message give by:     If discussed at [place] of Stay Meetings, dates discussed:    Additional Comments:  Erenest Rasher, RN 05/30/2016, 10:41 AM

## 2016-05-30 NOTE — Progress Notes (Signed)
Subjective: 1 Day Post-Op Procedure(s) (LRB): CLOSED MANIPULATION KNEE (N/A) Patient reports pain as 1 on 0-10 scale.Doing well. Brace adjusted.Will DC    Objective: Vital signs in last 24 hours: Temp:  [95 F (35 C)-99.9 F (37.7 C)] 97.7 F (36.5 C) (06/11 0521) Pulse Rate:  [42-115] 57 (06/11 0521) Resp:  [15-20] 15 (06/11 0521) BP: (114-158)/(56-95) 140/57 mmHg (06/11 0521) SpO2:  [96 %-100 %] 97 % (06/11 0521) Weight:  [81.647 kg (180 lb)] 81.647 kg (180 lb) (06/10 2100)  Intake/Output from previous day: 06/10 0701 - 06/11 0700 In: 1730 [P.O.:480; I.V.:1250] Out: 1000 [Urine:1000] Intake/Output this shift: Total I/O In: -  Out: 450 [Urine:450]   Recent Labs  05/29/16 1813  HGB 10.9*    Recent Labs  05/29/16 1813  HCT 32.0*    Recent Labs  05/29/16 1813  NA 135  K 5.2*  CL 104  BUN 42*  CREATININE 0.90  GLUCOSE 227*   No results for input(s): LABPT, INR in the last 72 hours.  Neurologically intact  Assessment/Plan: 1 Day Post-Op Procedure(s) (LRB): CLOSED MANIPULATION KNEE (N/A) Up with therapy Discharge home with home health Office Spirometry Results:   this Friday.  Tayjon Halladay A 05/30/2016, 8:38 AM

## 2016-05-30 NOTE — Op Note (Signed)
NAME:  Stanley [name] MDFelton, [place] NO.:  [id number]  MEDICAL RECORD NO.:  [id number]  LOCATION:  47                         FACILITY:  [facility]  PHYSICIAN:  Kipp Brood. Eternity Dexter, M.D.DATE OF BIRTH:  1925/02/06  DATE OF PROCEDURE:  05/29/2016 DATE OF DISCHARGE:                              OPERATIVE REPORT   SURGEON:  Kipp Brood. Gladstone Lighter, M.D.  OPERATIVE ASSISTANT:  Wyatt Portela, PA.  PREOPERATIVE DIAGNOSIS:  Closed anterior dislocation of a right total knee.  POSTOPERATIVE DIAGNOSIS:  Closed anterior dislocation of a right total knee.  OPERATION:  Closed manipulation of a right anterior dislocated total knee.  DESCRIPTION OF PROCEDURE:  Note, the appropriate time-out was first carried out.  I also marked the appropriate right leg in the holding area.  At this time, after adequate general anesthetic, a gentle closed manipulation of his right total knee was carried out and reduced.  We had the C-arm prove that the knee was well located both on the AP and lateral, and in flexion.  Note, we did Doppler his posterior tibial pulse preop, which was intact.  We Dopplered his pulse postop and it was intact.  The reason for this: 1. Because of the dislocation. 2. Because of the severe atherosclerosis of his popliteal artery.  He was then, after surgery, was placed in a knee immobilizer in extension.  He will be admitted overnight for pain control and observation because of the nature of this injury and concern for any vascular injury.  The patient did not require any antibiotics.          ______________________________ Kipp Brood Gladstone Lighter, M.D.     RAG/MEDQ  D:  05/29/2016  T:  05/30/2016  Job:  [id number]

## 2016-05-30 NOTE — Evaluation (Signed)
Physical Therapy Evaluation Patient Details Name: Stanley DUMITRESCU MD MRN: [id number] DOB: October 03, 1925 Today's Date: 05/30/2016   History of Present Illness  82 y.o male with h/o Parkinsons, R TKA, got MRSA, patella tendon rupture, excision of patella and extensor mechanism 30 years ago admitted with R knee dislocation s/p closed reduction. Wears R knee brace at baseline.  Clinical Impression  Pt reports he's at baseline with mobility. He ambulate 180' with R knee Donjoy brace, which he wears at baseline, with mild loss of balance x 1 which pt was able to self correct. He declined outpt PT for balance as this is his 3rd fall in past 1 year, he stated he's done this in the past.     Follow Up Recommendations No PT follow up    Equipment Recommendations  None recommended by PT    Recommendations for Other Services       Precautions / Restrictions Precautions Precautions: Fall Precaution Comments: 2 other falls in past year Required Braces or Orthoses: Other Brace/Splint Other Brace/Splint: R knee donjoy Restrictions Weight Bearing Restrictions: No Other Position/Activity Restrictions: wbat      Mobility  Bed Mobility Overal bed mobility: Modified Independent             General bed mobility comments: HOB up, no physical assist  Transfers Overall transfer level: Needs assistance   Transfers: Sit to/from Stand Sit to Stand: Min guard            Ambulation/Gait Ambulation/Gait assistance: Min guard Ambulation Distance (Feet): 180 Feet Assistive device: Rolling walker (2 wheeled) Gait Pattern/deviations: Step-through pattern   Gait velocity interpretation: at or above normal speed for age/gender General Gait Details: with R donjoy brace, LOB x 1 requiring min A  Stairs            Wheelchair Mobility    Modified Rankin (Stroke Patients Only)       Balance Overall balance assessment: History of Falls                                           Pertinent Vitals/Pain Pain Assessment: 0-10 Pain Score: 0-No pain    Home Living Family/patient expects to be discharged to:: Private residence Living Arrangements: Spouse/significant other Available Help at Discharge: Family;Available 24 hours/day Type of Home: House Home Access: Level entry     Home Layout: One level Home Equipment: Walker - 4 wheels;Shower seat      Prior Function Level of Independence: Independent with assistive device(s)         Comments: uses rollator     Hand Dominance        Extremity/Trunk Assessment               Lower Extremity Assessment: RLE deficits/detail RLE Deficits / Details: sensation intact, h/o no extensor mechanism, no physical assist for mobility    Cervical / Trunk Assessment: Kyphotic  Communication   Communication: No difficulties  Cognition Arousal/Alertness: Awake/alert Behavior During Therapy: WFL for tasks assessed/performed Overall Cognitive Status: Within Functional Limits for tasks assessed                      General Comments      Exercises        Assessment/Plan    PT Assessment    PT Diagnosis     PT Problem List    PT  Treatment Interventions     PT Goals (Current goals can be found in the Care Plan section) Acute Rehab PT Goals Patient Stated Goal: return to independence PT Goal Formulation: All assessment and education complete, DC therapy    Frequency     Barriers to discharge        Co-evaluation               End of Session Equipment Utilized During Treatment: Gait belt Activity Tolerance: Patient tolerated treatment well;No increased pain Patient left: in chair;with call bell/phone within reach Nurse Communication: Mobility status    Functional Assessment Tool Used: clinical judgement Functional Limitation: Mobility: Walking and moving around Mobility: Walking and Moving Around Current Status [id number]): At least 1 percent but less than 20 percent  impaired, limited or restricted Mobility: Walking and Moving Around Goal Status [phone]): At least 1 percent but less than 20 percent impaired, limited or restricted Mobility: Walking and Moving Around Discharge Status [phone]): At least 1 percent but less than 20 percent impaired, limited or restricted    Time: 0919-0955 PT Time Calculation (min) (ACUTE ONLY): 36 min   Charges:   PT Evaluation $PT Eval Low Complexity: 1 Procedure PT Treatments $Gait Training: 8-22 mins   PT G Codes:   PT G-Codes **NOT FOR INPATIENT CLASS** Functional Assessment Tool Used: clinical judgement Functional Limitation: Mobility: Walking and moving around Mobility: Walking and Moving Around Current Status [id number]): At least 1 percent but less than 20 percent impaired, limited or restricted Mobility: Walking and Moving Around Goal Status [phone]): At least 1 percent but less than 20 percent impaired, limited or restricted Mobility: Walking and Moving Around Discharge Status [phone]): At least 1 percent but less than 20 percent impaired, limited or restricted    Philomena Doheny 05/30/2016, 11:38 AM [phone]

## 2016-05-30 NOTE — Progress Notes (Signed)
OT Cancellation Note  Patient Details Name: Stanley KEPPLE MD MRN: [id number] DOB: 13-May-1925   Cancelled Treatment:    Reason Eval/Treat Not Completed: OT screened, no needs identified, will sign off.  Pt has had several sxs, has assistance at home and high commodes. Will sign off.  Zachary Nole 05/30/2016, 10:57 AM  Lesle Chris, OTR/L [phone] 05/30/2016

## 2016-05-31 ENCOUNTER — Encounter (HOSPITAL_COMMUNITY): Payer: Self-pay | Admitting: Orthopedic Surgery

## 2016-06-01 NOTE — Discharge Summary (Signed)
Physician Discharge Summary   Patient ID: Stanley GRONAU MD MRN: [id number] DOB/AGE: 80/22/1926 80 y.o.  Admit date: 05/29/2016 Discharge date: 05/30/2016  Primary Diagnosis: Dislocated right total knee  Admission Diagnoses:  Past Medical History  Diagnosis Date  . Diabetes mellitus   . BPH (benign prostatic hyperplasia)   . Arthritis   . GERD (gastroesophageal reflux disease)   . Hypertension     pt denies  . Shortness of breath     uses cpap  . Peripheral vascular disease ([place]) 03-06-12    aortogram showed severe bilateral unreconstructible tibial artery disease  . Failed total knee, right ([place])   . H pylori ulcer     Gastric ulcer  . CAD (coronary artery disease)  s/p CABG     sees Dr. Loralie Champagne   . Aortic stenosis     S/p pericardial valve  . Mobitz type 1 second degree atrioventricular block   . Shingles 05-12-12  . Lumbar spinal stenosis     sees Dr. Sherwood Gambler   . Anemia     sees Dr. Julien Nordmann    Discharge Diagnoses:   Active Problems:   History of total knee arthroplasty  Estimated body mass index is 29.07 kg/(m^2) as calculated from the following:   Height as of this encounter: 5\' 6"  (1.676 m).   Weight as of this encounter: 81.647 kg (180 lb).  Procedure:  Procedure(s) (LRB): CLOSED MANIPULATION KNEE (N/A)   Consults: None  HPI: Patient fell today in his bathroom. Immediate right knee pain and dysfunction. EMS to [facility]. Right knee dislocation. History of bilateral TKA by Dr. Collie Siad and ALuisio in the early 2000's. Right patella tendon rupture history. Denies CP, SOB, or calf pain. No changes in neuro status.  Laboratory Data: Admission on 05/29/2016, Discharged on 05/30/2016  Component Date Value Ref Range Status  . Sodium 05/29/2016 135  135 - 145 mmol/L Final  . Potassium 05/29/2016 5.2* 3.5 - 5.1 mmol/L Final  . Chloride 05/29/2016 104  101 - 111 mmol/L Final  . BUN 05/29/2016 42* 6 - 20 mg/dL Final  . Creatinine, Ser 05/29/2016 0.90  0.61 - 1.24  mg/dL Final  . Glucose, Bld 05/29/2016 227* 65 - 99 mg/dL Final  . Calcium, Ion 05/29/2016 1.18  1.13 - 1.30 mmol/L Final  . TCO2 05/29/2016 20  0 - 100 mmol/L Final  . Hemoglobin 05/29/2016 10.9* 13.0 - 17.0 g/dL Final  . HCT 05/29/2016 32.0* 39.0 - 52.0 % Final  . ABO/RH(D) 05/29/2016 O POS   Final  . Antibody Screen 05/29/2016 NEG   Final  . Sample Expiration 05/29/2016 06/01/2016   Final  . Glucose-Capillary 05/29/2016 178* 65 - 99 mg/dL Final  . Glucose-Capillary 05/30/2016 185* 65 - 99 mg/dL Final  . Glucose-Capillary 05/30/2016 197* 65 - 99 mg/dL Final  Appointment on 05/04/2016  Component Date Value Ref Range Status  . WBC 05/04/2016 8.8  4.0 - 10.3 10e3/uL Final  . NEUT# 05/04/2016 6.3  1.5 - 6.5 10e3/uL Final  . HGB 05/04/2016 10.0* 13.0 - 17.1 g/dL Final  . HCT 05/04/2016 30.4* 38.4 - 49.9 % Final  . Platelets 05/04/2016 251  140 - 400 10e3/uL Final  . MCV 05/04/2016 93.8  79.3 - 98.0 fL Final  . MCH 05/04/2016 30.9  27.2 - 33.4 pg Final  . MCHC 05/04/2016 32.9  32.0 - 36.0 g/dL Final  . RBC 05/04/2016 3.24* 4.20 - 5.82 10e6/uL Final  . RDW 05/04/2016 13.9  11.0 - 14.6 % Final  .  lymph# 05/04/2016 1.2  0.9 - 3.3 10e3/uL Final  . MONO# 05/04/2016 1.1* 0.1 - 0.9 10e3/uL Final  . Eosinophils Absolute 05/04/2016 0.2  0.0 - 0.5 10e3/uL Final  . Basophils Absolute 05/04/2016 0.0  0.0 - 0.1 10e3/uL Final  . NEUT% 05/04/2016 71.6  39.0 - 75.0 % Final  . LYMPH% 05/04/2016 13.7* 14.0 - 49.0 % Final  . MONO% 05/04/2016 12.2  0.0 - 14.0 % Final  . EOS% 05/04/2016 2.0  0.0 - 7.0 % Final  . BASO% 05/04/2016 0.5  0.0 - 2.0 % Final  . Ferritin 05/04/2016 274  22 - 316 ng/ml Final  . Iron 05/04/2016 43  42 - 163 ug/dL Final  . TIBC 05/04/2016 280  202 - 409 ug/dL Final  . UIBC 05/04/2016 237  117 - 376 ug/dL Final  . %SAT 05/04/2016 16* 20 - 55 % Final     X-Rays:Dg Knee Complete 4 Views Right  05/29/2016  CLINICAL DATA:  Previous knee replacement. [place] today with sensation of  dislocation. EXAM: RIGHT KNEE - COMPLETE 4+ VIEW COMPARISON:  None. FINDINGS: There is dislocation of the femoral component anteriorly relative to the tibial component. No acute fracture evident. IMPRESSION: Anterior dislocation of the femoral component relative to the tibial component. Electronically Signed   By: Nelson Chimes M.D.   On: 05/29/2016 16:18   Dg C-arm 1-60 Min  05/29/2016  CLINICAL DATA:  Reduction of right knee dislocation EXAM: DG C-ARM 61-120 MIN; RIGHT KNEE - 3 VIEW COMPARISON:  Right knee radiographs from earlier today FINDINGS: Fluoroscopy time 0 minutes 3 seconds. Two nondiagnostic spot fluoroscopic intraoperative radiographs demonstrate right total knee arthroplasty with apparent successful reduction of the right knee dislocation on the provided views. IMPRESSION: Intraoperative fluoroscopic guidance for reduction of right knee dislocation. Electronically Signed   By: Ilona Sorrel M.D.   On: 05/29/2016 20:09   Dg Knee 2 Views Right  05/29/2016  CLINICAL DATA:  Reduction of right knee dislocation EXAM: DG C-ARM 61-120 MIN; RIGHT KNEE - 3 VIEW COMPARISON:  Right knee radiographs from earlier today FINDINGS: Fluoroscopy time 0 minutes 3 seconds. Two nondiagnostic spot fluoroscopic intraoperative radiographs demonstrate right total knee arthroplasty with apparent successful reduction of the right knee dislocation on the provided views. IMPRESSION: Intraoperative fluoroscopic guidance for reduction of right knee dislocation. Electronically Signed   By: Ilona Sorrel M.D.   On: 05/29/2016 20:09    EKG: Orders placed or performed during the hospital encounter of 05/29/16  . ED EKG  . ED EKG  . 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 They were brought to the operating room on 05/29/2016 and underwent Procedure(s): CLOSED MANIPULATION KNEE.  Patient tolerated the procedure well and was later transferred to the recovery room and  then to the orthopaedic floor for postoperative care.  They were given PO and IV analgesics for pain control following their surgery.  They were given 24 hours of postoperative antibiotics of  Anti-infectives    Start     Dose/Rate Route Frequency Ordered Stop   05/30/16 1400  cephALEXin (KEFLEX) capsule 500 mg  Status:  Discontinued     500 mg Oral 3 times daily 05/29/16 2055 05/30/16 1801   05/29/16 2200  ceFAZolin (ANCEF) IVPB 1 g/50 mL premix     1 g 100 mL/hr over 30 Minutes Intravenous Every 6 hours 05/29/16 2055 05/30/16 0423     and  started on DVT prophylaxis in the form of Aspirin.   PT was ordered to ambulate the patient. Discharge planning consulted to help with postop disposition and equipment needs.  Patient had a fair night on the evening of surgery.  They started to get up OOB with therapy on day one.  Patient was seen in rounds and was ready to go home.   Diet: Cardiac diet Activity:WBAT Follow-up:in 1 week Disposition - Home Discharged Condition: stable   Discharge Instructions    Call MD / Call 911    Complete by:  As directed   If you experience chest pain or shortness of breath, CALL 911 and be transported to the hospital emergency room.  If you develope a fever above 101 F, pus (white drainage) or increased drainage or redness at the wound, or calf pain, call your surgeon's office.     Constipation Prevention    Complete by:  As directed   Drink plenty of fluids.  Prune juice may be helpful.  You may use a stool softener, such as Colace (over the counter) 100 mg twice a day.  Use MiraLax (over the counter) for constipation as needed.     Diet - low sodium heart healthy    Complete by:  As directed      Discharge instructions    Complete by:  As directed   Continue knee immobilizer Weight-bearing as tolerated with walker Call if any temperatures greater than 101 or any wound complications: [phone] during the day and ask for Dr. Charlestine Night nurse, Brunilda Payor.      Increase activity slowly as tolerated    Complete by:  As directed             Medication List    STOP taking these medications        diphenoxylate-atropine 2.5-0.025 MG tablet  Commonly known as:  LOMOTIL     prochlorperazine 10 MG tablet  Commonly known as:  COMPAZINE      TAKE these medications        aspirin 81 MG tablet  Take 81 mg by mouth daily.     calcium-vitamin D 500-200 MG-UNIT tablet  Take 1 tablet by mouth 2 (two) times daily.     carbidopa-levodopa 25-100 MG tablet  Commonly known as:  SINEMET  2 in the AM, 1 in the afternoon, 2 in the evening before meals     cephALEXin 500 MG capsule  Commonly known as:  KEFLEX  Take 1 capsule (500 mg total) by mouth 3 (three) times daily.     chlorhexidine 0.12 % solution  Commonly known as:  PERIDEX  Use as directed 15 mLs in the mouth or throat 2 (two) times daily.     diclofenac 75 MG EC tablet  Commonly known as:  VOLTAREN  TAKE 1 TABLET TWICE A DAY     finasteride 5 MG tablet  Commonly known as:  PROSCAR  Take 5 mg by mouth daily.     glucose blood test strip  Commonly known as:  ONE TOUCH TEST STRIPS  Dispense one touch ultra, test TID and diagnosis code is E11.9     HYDROcodone-acetaminophen 10-325 MG tablet  Commonly known as:  NORCO  Take 1 tablet by mouth every 6 (six) hours as needed for severe pain.     INTEGRA PLUS Caps  TAKE 1 CAPSULE EVERY MORNING     LORazepam 0.5 MG tablet  Commonly known as:  ATIVAN  Take 1 tablet (0.5  mg total) by mouth every 8 (eight) hours as needed for anxiety.     multivitamin with minerals Tabs tablet  Take 1 tablet by mouth daily.     MYRBETRIQ 50 MG Tb24 tablet  Generic drug:  mirabegron ER  Take 50 mg by mouth daily.     onetouch ultrasoft lancets  Test once per day and diagnosis code is E 11.9     pantoprazole 40 MG tablet  Commonly known as:  PROTONIX  Take 1 tablet (40 mg total) by mouth 2 (two) times daily.     pioglitazone 45 MG tablet    Commonly known as:  ACTOS  Take 1 tablet (45 mg total) by mouth daily.     rosuvastatin 10 MG tablet  Commonly known as:  CRESTOR  Take 1 tablet (10 mg total) by mouth daily.     sitaGLIPtin-metformin 50-1000 MG tablet  Commonly known as:  JANUMET  take 1 tablet by mouth twice a day WITH A MEAL.     tamsulosin 0.4 MG Caps capsule  Commonly known as:  FLOMAX  Take 0.4 mg by mouth daily after breakfast.     traMADol 50 MG tablet  Commonly known as:  ULTRAM  Take 50 mg by mouth every 12 (twelve) hours as needed for moderate pain.           Follow-up Information    Follow up with GIOFFRE,RONALD A, MD. Schedule an appointment as soon as possible for a visit on 06/04/2016.   Specialty:  Orthopedic Surgery   Contact information:   [address] [place] [address] [phone]       Signed: Ardeen Jourdain, PA-C Orthopaedic Surgery 06/01/2016, 8:59 AM

## 2016-06-02 ENCOUNTER — Ambulatory Visit (HOSPITAL_BASED_OUTPATIENT_CLINIC_OR_DEPARTMENT_OTHER): Payer: Medicare Other

## 2016-06-02 VITALS — BP 136/61 | HR 58 | Temp 98.1°F | Resp 16

## 2016-06-02 DIAGNOSIS — D509 Iron deficiency anemia, unspecified: Secondary | ICD-10-CM | POA: Diagnosis present

## 2016-06-02 DIAGNOSIS — D649 Anemia, unspecified: Secondary | ICD-10-CM

## 2016-06-02 MED ORDER — SODIUM CHLORIDE 0.9 % IV SOLN
Freq: Once | INTRAVENOUS | Status: AC
  Administered 2016-06-02: 14:00:00 via INTRAVENOUS

## 2016-06-02 MED ORDER — SODIUM CHLORIDE 0.9 % IV SOLN
510.0000 mg | Freq: Once | INTRAVENOUS | Status: AC
  Administered 2016-06-02: 510 mg via INTRAVENOUS
  Filled 2016-06-02: qty 17

## 2016-06-02 NOTE — Patient Instructions (Signed)

## 2016-06-03 ENCOUNTER — Ambulatory Visit (INDEPENDENT_AMBULATORY_CARE_PROVIDER_SITE_OTHER): Payer: Medicare Other | Admitting: Neurology

## 2016-06-03 ENCOUNTER — Encounter: Payer: Self-pay | Admitting: Neurology

## 2016-06-03 VITALS — BP 142/68 | HR 162 | Ht 68.0 in | Wt 180.0 lb

## 2016-06-03 DIAGNOSIS — G20 Parkinson's disease: Secondary | ICD-10-CM | POA: Diagnosis not present

## 2016-06-03 DIAGNOSIS — I25810 Atherosclerosis of coronary artery bypass graft(s) without angina pectoris: Secondary | ICD-10-CM

## 2016-06-03 DIAGNOSIS — G4700 Insomnia, unspecified: Secondary | ICD-10-CM | POA: Diagnosis not present

## 2016-06-03 NOTE — Anesthesia Postprocedure Evaluation (Signed)
Anesthesia Post Note  Patient: [name] MD  Procedure(s) Performed: Procedure(s) (LRB): CLOSED MANIPULATION KNEE (N/A)  Patient location during evaluation: PACU Anesthesia Type: General Level of consciousness: awake, awake and alert and oriented Pain management: pain level controlled Vital Signs Assessment: post-procedure vital signs reviewed and stable Respiratory status: spontaneous breathing, nonlabored ventilation and respiratory function stable Cardiovascular status: blood pressure returned to baseline Anesthetic complications: no    Last Vitals:  Filed Vitals:   05/30/16 0521 05/30/16 1034  BP: 140/57 138/64  Pulse: 57 75  Temp: 36.5 C 37 C  Resp: 15 16    Last Pain:  Filed Vitals:   05/30/16 1239  PainSc: 6                  Trequan Marsolek COKER

## 2016-06-03 NOTE — Progress Notes (Signed)
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  The consultation is for the evaluation of tremor and to rule out Parkinson's disease.  I reviewed his primary care records that are available to me.  The patient noted a "intention" tremor that started in the L hand for years ago but over the last year he has noted tremor in the right hand at rest over the last year at rest.  He is right hand hand dominant. He denies head or jaw tremor but records state that he has this.  The patient was started on Mirapex in February, 2016 and was only able to get up to 0.25 at night and only took it for 2 nights and "it knocked me out."  11/11/15 update:  The patient is following up today.  I diagnosed him almost 2 months ago with Parkinson's disease.  I started him on levodopa and he is on carbidopa/levodopa 25/100 tid (8-9am/1pm/5-6pm).   He states that he has gotten better "but I'm not quite there."  Tremor is still present but never goes away.   He was having inner tremor but that is gone.   He states that he reached the point where he improved but isn't improving any more and he wanted to see if we could alter the medication.  R hand tremor and some tremor in the jaw.  No hallucinations.  No falls.  No lightheadedness or near syncope.   He has been attending physical therapy [facility].  02/27/16 update:  The patient follows up today regarding his Parkinson's disease.  Last visit, I increased his carbidopa/levodopa 25/100 and I told him that he could take either one and a half tablets 3 times per day, or if he had difficulty cutting the pills, he could take 2 tablets in the morning, one in the afternoon and 2 in the evening.  He ended up taking it this way.  He did complete his physical therapies on January 19.  He has one fall 2 weeks ago; went to get out of bed in middle of the night and fell back and just  had trouble getting up but he did pull himself up on the bench at the end of the bed.  He does worry about his speech and did not get ST as it was felt that he did not need it at the time.  He thinks he does now.    He is having more short of breath.  He did follow up with his primary care physician on 02/25/2016 and it was felt that his symptoms were likely multifactorial.  He has been fairly bradycardic (40 bpm on his visit on February 20 and 56 beats permitted on his visit on March 8) and his primary care doctor was trying to get him back in with cardiology as soon as possible.  He saw Dr. Acie Fredrickson today and it was felt that nothing further needs to be done currently.  He has been mildly anemic, and his primary care physician was also going to try to get him iron infusions.  Pt states that he actually insisted on blood transfusion and is scheduled for that later today.  Dr. Sarajane Jews had also told him to stop driving for now.  His wife does ask me about the Xanax that he is taking.  He reports that he really only takes 0.25 mg at night and then occasionally during  the day as needed.  However, his wife thinks that the "as needed" dosages are more frequent and he thinks.  He admits that he takes them when tremor increases.  06/03/16 update:  The patient follows up today regarding his Parkinson's disease.  He is on carbidopa/levodopa 25/100, 2 tablets in the morning, one in  the afternoon and 2 in the evening.   He wants to increase it because tremor has increased in the afternoon and has internal tremor in the afternoon.   He has been to speech therapy since last visit.  He has also received a transfusion since last visit, and he reports that that did help with his fatigue.  His family expressed discontinued by his primary care physician and he was changed to [place].  I had told him last visit that he could try melatonin as well for insomnia, which has been studied in Parkinson's patients.  He states that he is sleeping  well and the melatonin has helped.  The patient did go to the emergency room on June 10 and I reviewed those records.  He fell while he was in the bathroom and he was getting ready to get a shower and he fell.    He dislocated the right knee and had surgical manipulation for that on June 11.  He is pain free.  No other falls.  No hallucination  Neuroimaging has not previously been performed.    PREVIOUS MEDICATIONS: Mirapex, 0.25 mg daily at bedtime - only took for 2 days   ALLERGIES:   Allergies  Allergen Reactions  . Morphine And Related Nausea And Vomiting  . Succinylcholine Other (See Comments)    unknown  . Ace Inhibitors Other (See Comments)    cough    CURRENT MEDICATIONS:  Outpatient Encounter Prescriptions as of 06/03/2016  Medication Sig  . aspirin 81 MG tablet Take 81 mg by mouth daily.  . Calcium Carbonate-Vitamin D (CALCIUM-VITAMIN D) 500-200 MG-UNIT per tablet Take 1 tablet by mouth 2 (two) times daily.   . carbidopa-levodopa (SINEMET) 25-100 MG tablet 2 in the AM, 1 in the afternoon, 2 in the evening before meals (Patient taking differently: Take 1-2 tablets by mouth See admin instructions. 2 in the AM, 1 in the afternoon, 2 in the evening before meals)  . cephALEXin (KEFLEX) 500 MG capsule Take 1 capsule (500 mg total) by mouth 3 (three) times daily.  . chlorhexidine (PERIDEX) 0.12 % solution Use as directed 15 mLs in the mouth or throat 2 (two) times daily.   . diclofenac (VOLTAREN) 75 MG EC tablet TAKE 1 TABLET TWICE A DAY  . FeFum-FePoly-FA-B Cmp-C-Biot (INTEGRA PLUS) CAPS TAKE 1 CAPSULE EVERY MORNING  . finasteride (PROSCAR) 5 MG tablet Take 5 mg by mouth daily.  Marland Kitchen glucose blood (ONE TOUCH TEST STRIPS) test strip Dispense one touch ultra, test TID and diagnosis code is E11.9  . HYDROcodone-acetaminophen (NORCO) 10-325 MG tablet Take 1 tablet by mouth every 6 (six) hours as needed for severe pain.  . Lancets (ONETOUCH ULTRASOFT) lancets Test once per day and diagnosis  code is E 11.9  . LORazepam (ATIVAN) 0.5 MG tablet Take 1 tablet (0.5 mg total) by mouth every 8 (eight) hours as needed for anxiety.  . mirabegron ER (MYRBETRIQ) 50 MG TB24 tablet Take 50 mg by mouth daily.  . Multiple Vitamin (MULITIVITAMIN WITH MINERALS) TABS Take 1 tablet by mouth daily.  . pantoprazole (PROTONIX) 40 MG tablet Take 1 tablet (40 mg total) by mouth 2 (two)  times daily.  . pioglitazone (ACTOS) 45 MG tablet Take 1 tablet (45 mg total) by mouth daily.  . rosuvastatin (CRESTOR) 10 MG tablet Take 1 tablet (10 mg total) by mouth daily.  . sitaGLIPtin-metformin (JANUMET) 50-1000 MG tablet take 1 tablet by mouth twice a day WITH A MEAL. (Patient taking differently: Take 1 tablet by mouth 2 (two) times daily with a meal. take 1 tablet by mouth twice a day WITH A MEAL.)  . tamsulosin (FLOMAX) 0.4 MG CAPS capsule Take 0.4 mg by mouth daily after breakfast.   . traMADol (ULTRAM) 50 MG tablet Take 50 mg by mouth every 12 (twelve) hours as needed for moderate pain.    No facility-administered encounter medications on file as of 06/03/2016.    PAST MEDICAL HISTORY:   Past Medical History  Diagnosis Date  . Diabetes mellitus   . BPH (benign prostatic hyperplasia)   . Arthritis   . GERD (gastroesophageal reflux disease)   . Hypertension     pt denies  . Shortness of breath     uses cpap  . Peripheral vascular disease ([place]) 03-06-12    aortogram showed severe bilateral unreconstructible tibial artery disease  . Failed total knee, right ([place])   . H pylori ulcer     Gastric ulcer  . CAD (coronary artery disease)  s/p CABG     sees Dr. Loralie Champagne   . Aortic stenosis     S/p pericardial valve  . Mobitz type 1 second degree atrioventricular block   . Shingles 05-12-12  . Lumbar spinal stenosis     sees Dr. Sherwood Gambler   . Anemia     sees Dr. Julien Nordmann     PAST SURGICAL HISTORY:   Past Surgical History  Procedure Laterality Date  . Aortic valve replacement    . Foot arthrotomy  Right   . Tonsillectomy    . Cataract extraction, bilateral    . Humerus fracture surgery    . Replacement total knee Bilateral   . Toe amputation      rt 2nd  . Cardiac catheterization    . Amputation  01/19/2012    Procedure: AMPUTATION DIGIT;  Surgeon: Colin Rhein, MD;  Location: [place];  Service: Orthopedics;  Laterality: Right;  right great toe amputation through MTP joint  . Rotator cuff repair Right   . Enteroscopy N/A 10/15/2014    Procedure: ENTEROSCOPY;  Surgeon: Gatha Mayer, MD;  Location: [facility];  Service: Endoscopy;  Laterality: N/A;  . Colonoscopy with propofol N/A 10/17/2014    Procedure: COLONOSCOPY WITH PROPOFOL;  Surgeon: Gatha Mayer, MD;  Location: [facility];  Service: Endoscopy;  Laterality: N/A;  . Abdominal aortagram N/A 02/25/2012    Procedure: ABDOMINAL AORTAGRAM;  Surgeon: Elam Dutch, MD;  Location: [facility] CATH LAB;  Service: Cardiovascular;  Laterality: N/A;  . Knee closed reduction N/A 05/29/2016    Procedure: CLOSED MANIPULATION KNEE;  Surgeon: Latanya Maudlin, MD;  Location: [facility];  Service: Orthopedics;  Laterality: N/A;    SOCIAL HISTORY:   Social History   Social History  . Marital Status: Married    Spouse Name: N/A  . Number of Children: N/A  . Years of Education: N/A   Occupational History  . retired     Engineer, drilling (primary care physician)   Social History Main Topics  . Smoking status: Never Smoker   . Smokeless tobacco: Never Used  . Alcohol Use: 4.2 oz/week    7 Glasses  of wine per week     Comment: glass of wine or vodka/tonic per night  . Drug Use: No  . Sexual Activity: Not on file   Other Topics Concern  . Not on file   Social History Narrative   Married, retired primary care MD    + children          FAMILY HISTORY:   Family Status  Relation Status Death Age  . Mother Deceased     carotid artery aneurysm  . Father Deceased 41    unknown cause of death  . Brother Deceased     as  child  . Sister Deceased 37    amyloid  . Child Alive     2 sons, healthy    ROS:  A complete 10 system review of systems was obtained and was unremarkable apart from what is mentioned above.  PHYSICAL EXAMINATION:    VITALS:   Filed Vitals:   06/03/16 1343  BP: 142/68  Pulse: 162  Height: 5\' 8"  (1.727 m)  Weight: 180 lb (81.647 kg)  SpO2: 88%    GEN:  The patient appears stated age and is in NAD. HEENT:  Normocephalic, atraumatic.  The mucous membranes are moist. The superficial temporal arteries are without ropiness or tenderness. CV:  RRR Lungs:  CTAB Neck/HEME:  There are no carotid bruits bilaterally.  Neurological examination:  Orientation: The patient is alert and oriented x3.  Cranial nerves: There is good facial symmetry.  There is mild facial hypomimia.    Extraocular muscles are intact. The visual fields are full to confrontational testing. The speech is fluent and clear. Soft palate rises symmetrically and there is no tongue deviation. Hearing is intact to conversational tone. Sensation: Sensation is intact to light touch throughout Motor: Strength is 5/5 in the bilateral upper and lower extremities.   Shoulder shrug is equal and symmetric.  There is no pronator drift.   Movement examination: Tone: There is good tone in the upper and lower extremity today.  Abnormal movements: There is a bilateral UE resting tremor, L more than R but both increase with distraction procedures.  The left and right upper extremity tremor are independent of one another. Coordination:  There is decremation with RAM's, with virtually all forms of RAM's, , including alternating supination and pronation of the forearm, hand opening and closing, finger taps, heel taps and toe taps. Gait and Station: The patient has difficulty arising out of a deep-seated chair without the use of the hands. The patient's stride length is decreased; he attempted to walk without the walker for a few steps, but  is very unsteady.  With the walker, he actually does quite well.  He drags the right leg mildly.   ASSESSMENT/PLAN:  1.  Idiopathic Parkinson's disease.  The patient has tremor, bradykinesia, rigidity and postural instability.  -Increase carbidopa/levodopa 25/100, to 2 po tid.  Told could take an extra if needed.  Asked me about upcoming therapies and discussed these.  -Talked to him about safe, cardiovascular exercise, and particularly talked much about stationary bikes.  He was using it until recently but after his knee heals, he needs to get back to that.   2.  Insomnia  -resolved with melatonin 3.  Diabetic peripheral neuropathy  -This will contribute to gait instability.  Safety discussed 4.  Follow up is anticipated in the next few months, sooner should new neurologic issues arise.  Much greater than 50% of this visit was spent in  counseling with the patient and the family.  Total face to face time:  25 min

## 2016-06-09 ENCOUNTER — Encounter (HOSPITAL_COMMUNITY)
Admission: EM | Disposition: A | Discharge status: Home / Self Care | Payer: Self-pay | Source: Home / Self Care | Attending: Emergency Medicine

## 2016-06-09 ENCOUNTER — Emergency Department (HOSPITAL_COMMUNITY): Payer: Medicare Other | Admitting: Registered Nurse

## 2016-06-09 ENCOUNTER — Observation Stay (HOSPITAL_COMMUNITY)
Admission: EM | Admit: 2016-06-09 | Discharge: 2016-06-10 | Disposition: A | Discharge status: Home / Self Care | Payer: Medicare Other | Attending: Orthopedic Surgery | Admitting: Orthopedic Surgery

## 2016-06-09 ENCOUNTER — Encounter (HOSPITAL_COMMUNITY): Payer: Self-pay

## 2016-06-09 ENCOUNTER — Emergency Department (HOSPITAL_COMMUNITY): Payer: Medicare Other

## 2016-06-09 DIAGNOSIS — G4730 Sleep apnea, unspecified: Secondary | ICD-10-CM | POA: Insufficient documentation

## 2016-06-09 DIAGNOSIS — S83106A Unspecified dislocation of unspecified knee, initial encounter: Secondary | ICD-10-CM | POA: Diagnosis present

## 2016-06-09 DIAGNOSIS — Z7984 Long term (current) use of oral hypoglycemic drugs: Secondary | ICD-10-CM | POA: Insufficient documentation

## 2016-06-09 DIAGNOSIS — I441 Atrioventricular block, second degree: Secondary | ICD-10-CM | POA: Insufficient documentation

## 2016-06-09 DIAGNOSIS — D649 Anemia, unspecified: Secondary | ICD-10-CM | POA: Diagnosis not present

## 2016-06-09 DIAGNOSIS — E1151 Type 2 diabetes mellitus with diabetic peripheral angiopathy without gangrene: Secondary | ICD-10-CM | POA: Diagnosis not present

## 2016-06-09 DIAGNOSIS — N400 Enlarged prostate without lower urinary tract symptoms: Secondary | ICD-10-CM | POA: Diagnosis not present

## 2016-06-09 DIAGNOSIS — Z79899 Other long term (current) drug therapy: Secondary | ICD-10-CM | POA: Insufficient documentation

## 2016-06-09 DIAGNOSIS — T84022A Instability of internal right knee prosthesis, initial encounter: Principal | ICD-10-CM | POA: Insufficient documentation

## 2016-06-09 DIAGNOSIS — Z96653 Presence of artificial knee joint, bilateral: Secondary | ICD-10-CM | POA: Diagnosis not present

## 2016-06-09 DIAGNOSIS — T1490XA Injury, unspecified, initial encounter: Secondary | ICD-10-CM

## 2016-06-09 DIAGNOSIS — G20 Parkinson's disease: Secondary | ICD-10-CM | POA: Insufficient documentation

## 2016-06-09 DIAGNOSIS — Z952 Presence of prosthetic heart valve: Secondary | ICD-10-CM | POA: Diagnosis not present

## 2016-06-09 DIAGNOSIS — Z89421 Acquired absence of other right toe(s): Secondary | ICD-10-CM | POA: Insufficient documentation

## 2016-06-09 DIAGNOSIS — Y831 Surgical operation with implant of artificial internal device as the cause of abnormal reaction of the patient, or of later complication, without mention of misadventure at the time of the procedure: Secondary | ICD-10-CM | POA: Diagnosis not present

## 2016-06-09 DIAGNOSIS — K219 Gastro-esophageal reflux disease without esophagitis: Secondary | ICD-10-CM | POA: Insufficient documentation

## 2016-06-09 DIAGNOSIS — Z7982 Long term (current) use of aspirin: Secondary | ICD-10-CM | POA: Diagnosis not present

## 2016-06-09 DIAGNOSIS — I2510 Atherosclerotic heart disease of native coronary artery without angina pectoris: Secondary | ICD-10-CM | POA: Diagnosis not present

## 2016-06-09 DIAGNOSIS — Z951 Presence of aortocoronary bypass graft: Secondary | ICD-10-CM | POA: Insufficient documentation

## 2016-06-09 DIAGNOSIS — Z89411 Acquired absence of right great toe: Secondary | ICD-10-CM | POA: Insufficient documentation

## 2016-06-09 DIAGNOSIS — S83104A Unspecified dislocation of right knee, initial encounter: Secondary | ICD-10-CM | POA: Diagnosis present

## 2016-06-09 HISTORY — PX: CLOSED REDUCTION PATELLAR: SHX5007

## 2016-06-09 SURGERY — CLOSED REDUCTION, PATELLA
Anesthesia: General | Laterality: Right

## 2016-06-09 MED ORDER — EPHEDRINE SULFATE 50 MG/ML IJ SOLN
INTRAMUSCULAR | Status: DC | PRN
  Administered 2016-06-09: 15 mg via INTRAVENOUS

## 2016-06-09 MED ORDER — LIDOCAINE HCL (CARDIAC) 20 MG/ML IV SOLN
INTRAVENOUS | Status: AC
  Filled 2016-06-09: qty 5

## 2016-06-09 MED ORDER — LACTATED RINGERS IV SOLN
INTRAVENOUS | Status: DC | PRN
  Administered 2016-06-09: 23:00:00 via INTRAVENOUS

## 2016-06-09 MED ORDER — ONDANSETRON HCL 4 MG/2ML IJ SOLN
INTRAMUSCULAR | Status: DC | PRN
  Administered 2016-06-09: 4 mg via INTRAVENOUS

## 2016-06-09 MED ORDER — LIDOCAINE HCL (CARDIAC) 20 MG/ML IV SOLN
INTRAVENOUS | Status: DC | PRN
  Administered 2016-06-09: 80 mg via INTRAVENOUS

## 2016-06-09 MED ORDER — ONDANSETRON HCL 4 MG/2ML IJ SOLN
INTRAMUSCULAR | Status: AC
  Filled 2016-06-09: qty 2

## 2016-06-09 MED ORDER — PROPOFOL 10 MG/ML IV BOLUS
INTRAVENOUS | Status: DC | PRN
  Administered 2016-06-09: 70 mg via INTRAVENOUS
  Administered 2016-06-09: 30 mg via INTRAVENOUS

## 2016-06-09 MED ORDER — PROPOFOL 10 MG/ML IV BOLUS
INTRAVENOUS | Status: AC
  Filled 2016-06-09: qty 20

## 2016-06-09 NOTE — Consult Note (Signed)
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 male.  HPI: 82 yo male well known to me, who has a long history of problems with his right knee. 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 He fell on the knee tonight and sustained a dislocation. This is his second dislocation due to a fall in the past 2 weeks.He has knee pain but is able to move his right foot.  Past Medical History  Diagnosis Date  . Diabetes mellitus   . BPH (benign prostatic hyperplasia)   . Arthritis   . GERD (gastroesophageal reflux disease)   . Hypertension     pt denies  . Shortness of breath     uses cpap  . Peripheral vascular disease ([place]) 03-06-12    aortogram showed severe bilateral unreconstructible tibial artery disease  . Failed total knee, right ([place])   . H pylori ulcer     Gastric ulcer  . CAD (coronary artery disease)  s/p CABG     sees Dr. Loralie Champagne   . Aortic stenosis     S/p pericardial valve  . Mobitz type 1 second degree atrioventricular block   . Shingles 05-12-12  . Lumbar spinal stenosis     sees Dr. Sherwood Gambler   . Anemia     sees Dr. Julien Nordmann     Past Surgical History  Procedure Laterality Date  . Aortic valve replacement    . Foot arthrotomy Right   . Tonsillectomy    . Cataract extraction, bilateral    . Humerus fracture surgery    . Replacement total knee Bilateral   . Toe amputation      rt 2nd  . Cardiac catheterization    . Amputation  01/19/2012    Procedure: AMPUTATION DIGIT;  Surgeon: Colin Rhein, MD;  Location: [place];  Service: Orthopedics;  Laterality: Right;  right great toe amputation through MTP joint  . Rotator cuff repair Right   . Enteroscopy N/A 10/15/2014    Procedure: ENTEROSCOPY;  Surgeon: Gatha Mayer, MD;  Location: [facility];  Service: Endoscopy;  Laterality: N/A;  . Colonoscopy with propofol N/A 10/17/2014   Procedure: COLONOSCOPY WITH PROPOFOL;  Surgeon: Gatha Mayer, MD;  Location: [facility];  Service: Endoscopy;  Laterality: N/A;  . Abdominal aortagram N/A 02/25/2012    Procedure: ABDOMINAL AORTAGRAM;  Surgeon: Elam Dutch, MD;  Location: [facility] CATH LAB;  Service: Cardiovascular;  Laterality: N/A;  . Knee closed reduction N/A 05/29/2016    Procedure: CLOSED MANIPULATION KNEE;  Surgeon: Latanya Maudlin, MD;  Location: [facility];  Service: Orthopedics;  Laterality: N/A;    Family History  Problem Relation Age of Onset  . Diabetes type II Mother   . Diabetes type II Father   . Colon cancer Neg Hx   . Esophageal cancer Neg Hx   . Rectal cancer Neg Hx   . Stomach cancer Neg Hx   . Heart attack Neg Hx   . Stroke Neg Hx   . Hypertension Mother   . Hypertension Father   . Diabetes Father     Social History:  reports that he has never smoked. He has never used smokeless tobacco. He reports that he drinks about 4.2 oz of alcohol per week. He reports that he does not use illicit drugs.  Allergies:  Allergies  Allergen Reactions  . Morphine And Related Nausea And  Vomiting  . Succinylcholine Other (See Comments)    unknown  . Ace Inhibitors Other (See Comments)    cough    Medications: I have reviewed the patient's current medications.  No results found for this or any previous visit (from the past 48 hour(s)).  Dg Knee Complete 4 Views Right  06/09/2016  CLINICAL DATA:  Initial encounter for Pt had his right knee dislocated last Saturday and tonight he fell again tonight at home and thinks he may have dislocated it again. Hx multiple surgeries EXAM: RIGHT KNEE - COMPLETE 4+ VIEW COMPARISON:  6/10/ 17 FINDINGS: Long-stem right knee arthroplasty. Vascular calcifications. Posterior dislocation of the tibial component relative to the femoral component. No knee joint effusion. No complicating fracture. IMPRESSION: Posterior dislocation of the tibial component of a total knee arthroplasty.  Electronically Signed   By: Abigail Miyamoto M.D.   On: 06/09/2016 21:52    ROS Blood pressure 137/63, pulse 82, temperature 98 F (36.7 C), temperature source Oral, resp. rate 18, SpO2 96 %. Physical Exam  Right knee flexed 90 degrees with inability to extend. Tibia is dislocated posteriorly. Can move foot. Sensation intact. PT pulse intact  X-ray- Posterior dislocation right knee  Assessment/Plan: Right knee dislocation- To OR for closed reduction. Discussed in detail with patient who elects to proceed. He would like to go home tonight if he is not too sedated.  Gearlean Alf 06/09/2016, 11:19 PM

## 2016-06-09 NOTE — ED Provider Notes (Signed)
Medical screening examination/treatment/procedure(s) were conducted as a shared visit with non-physician practitioner(s) and myself.  I personally evaluated the patient during the encounter.  Fell earlier today with persistent knee pain and deformity. Exam obvious deformity to right knee. Also has a skin tear over his right foot. Rest of exam is relatively benign. Patient multiple Sharia Reeve is to include coronary disease, aortic stenosis which makes [place] weary to sedate him and reduce his knee and emergency department so or so will be consultative for further management.   Merrily Pew, MD 06/10/16 [phone]

## 2016-06-09 NOTE — ED Provider Notes (Signed)
CSN: [id number]     Arrival date & time 06/09/16  2115 History   First MD Initiated Contact with Patient 06/09/16 2142     Chief Complaint  Patient presents with  . Knee Pain     (Consider location/radiation/quality/duration/timing/severity/associated sxs/prior Treatment) HPI Comments: 82 year old male with a history of diabetes mellitus, BPH, hypertension, peripheral vascular disease, coronary artery disease and aortic stenosis, Mobitz type I second-degree AV block, Parkinson's disease, and anemia presents to the emergency department for right knee pain. Patient reports that he had a mechanical fall this evening with impact to his right knee. He was previously wearing his knee brace, but discontinued use over the weekend. He has a history of right knee dislocation recently on 05/29/2016 requiring operative reduction. He denies any significant pain in his right knee, though pain does worsen with attempted movement. He denies any sensation changes in his right lower extremity. The patient is not on blood thinners. He denies any head trauma or loss of consciousness secondary to his fall tonight.  Patient is a 82 y.o. male presenting with knee pain. The history is provided by the patient. No language interpreter was used.  Knee Pain   Past Medical History  Diagnosis Date  . Diabetes mellitus   . BPH (benign prostatic hyperplasia)   . Arthritis   . GERD (gastroesophageal reflux disease)   . Hypertension     pt denies  . Shortness of breath     uses cpap  . Peripheral vascular disease ([place]) 03-06-12    aortogram showed severe bilateral unreconstructible tibial artery disease  . Failed total knee, right ([place])   . H pylori ulcer     Gastric ulcer  . CAD (coronary artery disease)  s/p CABG     sees Dr. Loralie Champagne   . Aortic stenosis     S/p pericardial valve  . Mobitz type 1 second degree atrioventricular block   . Shingles 05-12-12  . Lumbar spinal stenosis     sees Dr. Sherwood Gambler   .  Anemia     sees Dr. Julien Nordmann    Past Surgical History  Procedure Laterality Date  . Aortic valve replacement    . Foot arthrotomy Right   . Tonsillectomy    . Cataract extraction, bilateral    . Humerus fracture surgery    . Replacement total knee Bilateral   . Toe amputation      rt 2nd  . Cardiac catheterization    . Amputation  01/19/2012    Procedure: AMPUTATION DIGIT;  Surgeon: Colin Rhein, MD;  Location: [place];  Service: Orthopedics;  Laterality: Right;  right great toe amputation through MTP joint  . Rotator cuff repair Right   . Enteroscopy N/A 10/15/2014    Procedure: ENTEROSCOPY;  Surgeon: Gatha Mayer, MD;  [facility];  Service: Endoscopy;  Laterality: N/A;  . Colonoscopy with propofol N/A 10/17/2014    Procedure: COLONOSCOPY WITH PROPOFOL;  Surgeon: Gatha Mayer, MD;  Location[facility];  Service: Endoscopy;  Laterality: N/A;  . Abdominal aortagram N/A 02/25/2012    Procedure: ABDOMINAL AORTAGRAM;  Surgeon: Elam Dutch, MD;  Location: [facility] C[facility];  Service: Cardiovascular;  Laterality: N/A;  . Knee closed reduction N/A 05/29/2016    Procedure: CLOSED MANIPULATION KNEE;  Surgeon: Latanya Maudlin, MD;  Location: [facility];  Service: Orthopedics;  Laterality: N/A;   Family History  Problem Relation Age of Onset  . Diabetes type II Mother   .  Diabetes type II Father   . Colon cancer Neg Hx   . Esophageal cancer Neg Hx   . Rectal cancer Neg Hx   . Stomach cancer Neg Hx   . Heart attack Neg Hx   . Stroke Neg Hx   . Hypertension Mother   . Hypertension Father   . Diabetes Father    Social History  Substance Use Topics  . Smoking status: Never Smoker   . Smokeless tobacco: Never Used  . Alcohol Use: 4.2 oz/week    7 Glasses of wine per week     Comment: glass of wine or vodka/tonic per night    Review of Systems  Musculoskeletal: Positive for arthralgias.  Ten systems reviewed and are negative for acute change, except as  noted in the HPI.    Allergies  Morphine and related; Succinylcholine; and Ace inhibitors  Home Medications   Prior to Admission medications   Medication Sig Start Date End Date Taking? Authorizing Provider  aspirin 81 MG tablet Take 81 mg by mouth daily.   Yes Historical Provider, MD  Calcium Carbonate-Vitamin D (CALCIUM-VITAMIN D) 500-200 MG-UNIT per tablet Take 1 tablet by mouth 2 (two) times daily.    Yes Historical Provider, MD  carbidopa-levodopa (SINEMET) 25-100 MG tablet 2 in the AM, 1 in the afternoon, 2 in the evening before meals Patient taking differently: Take 1-2 tablets by mouth See admin instructions. 2 in the AM, 1 in the afternoon, 2 in the evening before meals 11/11/15  Yes Rebecca S Tat, DO  cephALEXin (KEFLEX) 500 MG capsule Take 1 capsule (500 mg total) by mouth 3 (three) times daily. 04/05/16  Yes [name], MD  chlorhexidine (PERIDEX) 0.12 % solution Use as directed 15 mLs in the mouth or throat 2 (two) times daily.  05/14/16  Yes Historical Provider, MD  diclofenac (VOLTAREN) 75 MG EC tablet TAKE 1 TABLET TWICE A DAY 03/25/16  Yes [name], MD  FeFum-FePoly-FA-B Cmp-C-Biot (INTEGRA PLUS) CAPS TAKE 1 CAPSULE EVERY MORNING 01/12/16  Yes Curt Bears, MD  finasteride (PROSCAR) 5 MG tablet Take 5 mg by mouth daily. 05/02/15  Yes Historical Provider, MD  HYDROcodone-acetaminophen (NORCO) 10-325 MG tablet Take 1 tablet by mouth every 6 (six) hours as needed for severe pain. 05/12/16  Yes [name], MD  LORazepam (ATIVAN) 0.5 MG tablet Take 1 tablet (0.5 mg total) by mouth every 8 (eight) hours as needed for anxiety. 03/19/16  Yes [name], MD  mirabegron ER (MYRBETRIQ) 50 MG TB24 tablet Take 50 mg by mouth daily.   Yes Historical Provider, MD  Multiple Vitamin (MULITIVITAMIN WITH MINERALS) TABS Take 1 tablet by mouth daily.   Yes Historical Provider, MD  pantoprazole (PROTONIX) 40 MG tablet Take 1 tablet (40 mg total) by mouth 2 (two) times daily. 01/29/16  Yes  Amy S Esterwood, PA-C  pioglitazone (ACTOS) 45 MG tablet Take 1 tablet (45 mg total) by mouth daily. 01/28/16  Yes [name], MD  rosuvastatin (CRESTOR) 10 MG tablet Take 1 tablet (10 mg total) by mouth daily. 11/24/15  Yes [name], MD  sitaGLIPtin-metformin (JANUMET) 50-1000 MG tablet take 1 tablet by mouth twice a day WITH A MEAL. Patient taking differently: Take 1 tablet by mouth 2 (two) times daily with a meal. take 1 tablet by mouth twice a day WITH A MEAL. 03/08/16  Yes [name], MD  tamsulosin (FLOMAX) 0.4 MG CAPS capsule Take 0.4 mg by mouth daily after  breakfast.  09/04/14  Yes Historical Provider, MD  traMADol (ULTRAM) 50 MG tablet Take 50 mg by mouth every 12 (twelve) hours as needed for moderate pain.  05/14/16  Yes Historical Provider, MD  glucose blood (ONE TOUCH TEST STRIPS) test strip Dispense one touch ultra, test TID and diagnosis code is E11.9 05/10/16   [name], MD  Lancets [facility] ULTRASOFT) lancets Test once per day and diagnosis code is E 11.9 12/03/15   [name], MD   BP 137/63 mmHg  Pulse 82  Temp(Src) 98 F (36.7 C) (Oral)  Resp 18  SpO2 96%   Physical Exam  Constitutional: He is oriented to person, place, and time. He appears well-developed and well-nourished. No distress.  Alert and in NAD  HENT:  Head: Normocephalic and atraumatic.  Eyes: Conjunctivae and EOM are normal. No scleral icterus.  Neck: Normal range of motion.  Cardiovascular: Normal rate, regular rhythm and intact distal pulses.   DP pulse 1+ in the RLE  Pulmonary/Chest: Effort normal. No respiratory distress.  Musculoskeletal:       Right knee: He exhibits decreased range of motion, deformity, abnormal alignment and bony tenderness. He exhibits no laceration.  Deformity to R knee c/w posterior tibial dislocation.  Neurological: He is alert and oriented to person, place, and time.  GCS 15. Sensation to light touch intact in the RLE.  Skin: Skin is warm and dry. No rash  noted. He is not diaphoretic. No erythema. No pallor.  Absent right great and 2nd toes. There is an overlying abrasion with scant bleeding. No laceration.  Psychiatric: He has a normal mood and affect. His behavior is normal.  Nursing note and vitals reviewed.   ED Course  Procedures (including critical care time) Labs Review Labs Reviewed - No data to display  Imaging Review Dg Knee Complete 4 Views Right  06/09/2016  CLINICAL DATA:  Initial encounter for Pt had his right knee dislocated last Saturday and tonight he fell again tonight at home and thinks he may have dislocated it again. Hx multiple surgeries EXAM: RIGHT KNEE - COMPLETE 4+ VIEW COMPARISON:  6/10/ 17 FINDINGS: Long-stem right knee arthroplasty. Vascular calcifications. Posterior dislocation of the tibial component relative to the femoral component. No knee joint effusion. No complicating fracture. IMPRESSION: Posterior dislocation of the tibial component of a total knee arthroplasty. Electronically Signed   By: Abigail Miyamoto M.D.   On: 06/09/2016 21:52   I have personally reviewed and evaluated these images and lab results as part of my medical decision-making.   EKG Interpretation None      10:43 PM Case discussed with Dr. Maureen Ralphs; he will take to OR for reduction.  MDM   Final diagnoses:  Dislocation, knee, right, initial encounter    82 year old male presents to the emergency department for evaluation of acute right knee prosthesis dislocation. Patient neurovascularly intact on my exam. Dr. Maureen Ralphs to take to OR for reduction under sedation.   Filed Vitals:   06/09/16 2126  BP: 137/63  Pulse: 82  Temp: 98 F (36.7 C)  TempSrc: Oral  Resp: 18  SpO2: 96%     Antonietta Breach, PA-C 06/09/16 2340  Merrily Pew, MD 06/10/16 0130

## 2016-06-09 NOTE — H&P (View-Only) (Signed)
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 male.  HPI: 82 yo male well known to me, who has a long history of problems with his right knee. 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 He fell on the knee tonight and sustained a dislocation. This is his second dislocation due to a fall in the past 2 weeks.He has knee pain but is able to move his right foot.  Past Medical History  Diagnosis Date  . Diabetes mellitus   . BPH (benign prostatic hyperplasia)   . Arthritis   . GERD (gastroesophageal reflux disease)   . Hypertension     pt denies  . Shortness of breath     uses cpap  . Peripheral vascular disease ([place]) 03-06-12    aortogram showed severe bilateral unreconstructible tibial artery disease  . Failed total knee, right ([place])   . H pylori ulcer     Gastric ulcer  . CAD (coronary artery disease)  s/p CABG     sees Dr. Loralie Champagne   . Aortic stenosis     S/p pericardial valve  . Mobitz type 1 second degree atrioventricular block   . Shingles 05-12-12  . Lumbar spinal stenosis     sees Dr. Sherwood Gambler   . Anemia     sees Dr. Julien Nordmann     Past Surgical History  Procedure Laterality Date  . Aortic valve replacement    . Foot arthrotomy Right   . Tonsillectomy    . Cataract extraction, bilateral    . Humerus fracture surgery    . Replacement total knee Bilateral   . Toe amputation      rt 2nd  . Cardiac catheterization    . Amputation  01/19/2012    Procedure: AMPUTATION DIGIT;  Surgeon: Colin Rhein, MD;  Location: [place];  Service: Orthopedics;  Laterality: Right;  right great toe amputation through MTP joint  . Rotator cuff repair Right   . Enteroscopy N/A 10/15/2014    Procedure: ENTEROSCOPY;  Surgeon: Gatha Mayer, MD;  [facility];  Service: Endoscopy;  Laterality: N/A;  . Colonoscopy with propofol N/A 10/17/2014   Procedure: COLONOSCOPY WITH PROPOFOL;  Surgeon: Gatha Mayer, MD;  [facility];  Service: Endoscopy;  Laterality: N/A;  . Abdominal aortagram N/A 02/25/2012    Procedure: ABDOMINAL AORTAGRAM;  Surgeon: Elam Dutch, MD;  Location: [facility] CATH LAB;  Service: Cardiovascular;  Laterality: N/A;  . Knee closed reduction N/A 05/29/2016    Procedure: CLOSED MANIPULATION KNEE;  Surgeon: Latanya Maudlin, MD;  [facility];  Service: Orthopedics;  Laterality: N/A;    Family History  Problem Relation Age of Onset  . Diabetes type II Mother   . Diabetes type II Father   . Colon cancer Neg Hx   . Esophageal cancer Neg Hx   . Rectal cancer Neg Hx   . Stomach cancer Neg Hx   . Heart attack Neg Hx   . Stroke Neg Hx   . Hypertension Mother   . Hypertension Father   . Diabetes Father     Social History:  reports that he has never smoked. He has never used smokeless tobacco. He reports that he drinks about 4.2 oz of alcohol per week. He reports that he does not use illicit drugs.  Allergies:  Allergies  Allergen Reactions  . Morphine And Related Nausea And  Vomiting  . Succinylcholine Other (See Comments)    unknown  . Ace Inhibitors Other (See Comments)    cough    Medications: I have reviewed the patient's current medications.  No results found for this or any previous visit (from the past 48 hour(s)).  Dg Knee Complete 4 Views Right  06/09/2016  CLINICAL DATA:  Initial encounter for Pt had his right knee dislocated last Saturday and tonight he fell again tonight at home and thinks he may have dislocated it again. Hx multiple surgeries EXAM: RIGHT KNEE - COMPLETE 4+ VIEW COMPARISON:  6/10/ 17 FINDINGS: Long-stem right knee arthroplasty. Vascular calcifications. Posterior dislocation of the tibial component relative to the femoral component. No knee joint effusion. No complicating fracture. IMPRESSION: Posterior dislocation of the tibial component of a total knee arthroplasty.  Electronically Signed   By: Abigail Miyamoto M.D.   On: 06/09/2016 21:52    ROS Blood pressure 137/63, pulse 82, temperature 98 F (36.7 C), temperature source Oral, resp. rate 18, SpO2 96 %. Physical Exam  Right knee flexed 90 degrees with inability to extend. Tibia is dislocated posteriorly. Can move foot. Sensation intact. PT pulse intact  X-ray- Posterior dislocation right knee  Assessment/Plan: Right knee dislocation- To OR for closed reduction. Discussed in detail with patient who elects to proceed. He would like to go home tonight if he is not too sedated.  Gearlean Alf 06/09/2016, 11:19 PM

## 2016-06-09 NOTE — Brief Op Note (Signed)
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

## 2016-06-09 NOTE — ED Notes (Signed)
Pt had his knee dislocated last Saturday and tonight he fell again and thinks he may have dislocated it again

## 2016-06-09 NOTE — Anesthesia Preprocedure Evaluation (Addendum)
Anesthesia Evaluation  Patient identified by MRN, date of birth, ID band Patient awake    Reviewed: Allergy & Precautions, NPO status   Airway Mallampati: II  TM Distance: >3 FB Neck ROM: Full    Dental   Pulmonary shortness of breath, sleep apnea ,    breath sounds clear to auscultation       Cardiovascular hypertension, + CAD, + Peripheral Vascular Disease and +CHF  + dysrhythmias  Rhythm:Regular Rate:Normal     Neuro/Psych    GI/Hepatic Neg liver ROS, GERD  ,  Endo/Other  diabetesHypothyroidism   Renal/GU negative Renal ROS     Musculoskeletal   Abdominal   Peds  Hematology  (+) anemia ,   Anesthesia Other Findings   Reproductive/Obstetrics                         Anesthesia Physical Anesthesia Plan  ASA: III  Anesthesia Plan: General   Post-op Pain Management:    Induction: Intravenous  Airway Management Planned: Oral ETT  Additional Equipment:   Intra-op Plan:   Post-operative Plan: Extubation in OR  Informed Consent: I have reviewed the patients History and Physical, chart, labs and discussed the procedure including the risks, benefits and alternatives for the proposed anesthesia with the patient or authorized representative who has indicated his/her understanding and acceptance.   Dental advisory given  Plan Discussed with: CRNA and Anesthesiologist  Anesthesia Plan Comments:        Anesthesia Quick Evaluation

## 2016-06-09 NOTE — Interval H&P Note (Signed)
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 After consideration of risks, benefits and other options for treatment, the patient has consented to  Procedure(s): CLOSED REDUCTION PATELLAR (Right) as a surgical intervention .  The patient's history has been reviewed, patient examined, no change in status, stable for surgery.  I have reviewed the patient's chart and labs.  Questions were answered to the patient's satisfaction.     Stanley [name]

## 2016-06-10 ENCOUNTER — Encounter (HOSPITAL_COMMUNITY): Payer: Self-pay | Admitting: Orthopedic Surgery

## 2016-06-10 ENCOUNTER — Other Ambulatory Visit: Payer: Self-pay | Admitting: Orthopedic Surgery

## 2016-06-10 DIAGNOSIS — N400 Enlarged prostate without lower urinary tract symptoms: Secondary | ICD-10-CM | POA: Diagnosis not present

## 2016-06-10 DIAGNOSIS — T84022A Instability of internal right knee prosthesis, initial encounter: Secondary | ICD-10-CM | POA: Diagnosis not present

## 2016-06-10 DIAGNOSIS — S83106A Unspecified dislocation of unspecified knee, initial encounter: Secondary | ICD-10-CM | POA: Diagnosis present

## 2016-06-10 DIAGNOSIS — E1151 Type 2 diabetes mellitus with diabetic peripheral angiopathy without gangrene: Secondary | ICD-10-CM | POA: Diagnosis not present

## 2016-06-10 DIAGNOSIS — S83104A Unspecified dislocation of right knee, initial encounter: Secondary | ICD-10-CM | POA: Diagnosis present

## 2016-06-10 DIAGNOSIS — K219 Gastro-esophageal reflux disease without esophagitis: Secondary | ICD-10-CM | POA: Diagnosis not present

## 2016-06-10 LAB — POCT I-STAT 4, (NA,K, GLUC, HGB,HCT)
Glucose, Bld: 173 mg/dL — ABNORMAL HIGH (ref 65–99)
HEMATOCRIT: 33 % — AB (ref 39.0–52.0)
Hemoglobin: 11.2 g/dL — ABNORMAL LOW (ref 13.0–17.0)
Potassium: 4.5 mmol/L (ref 3.5–5.1)
SODIUM: 136 mmol/L (ref 135–145)

## 2016-06-10 LAB — GLUCOSE, CAPILLARY
GLUCOSE-CAPILLARY: 183 mg/dL — AB (ref 65–99)
Glucose-Capillary: 162 mg/dL — ABNORMAL HIGH (ref 65–99)
Glucose-Capillary: 233 mg/dL — ABNORMAL HIGH (ref 65–99)

## 2016-06-10 MED ORDER — SODIUM CHLORIDE 0.9 % IV SOLN
INTRAVENOUS | Status: DC
Start: 2016-06-10 — End: 2016-06-10
  Administered 2016-06-10: 02:00:00 via INTRAVENOUS

## 2016-06-10 MED ORDER — CARBIDOPA-LEVODOPA 25-100 MG PO TABS: 1.0000 | ORAL_TABLET | ORAL | Status: DC

## 2016-06-10 MED ORDER — MORPHINE SULFATE (PF) 2 MG/ML IV SOLN: 2.0000 mg | INTRAVENOUS | Status: DC | PRN

## 2016-06-10 MED ORDER — ONDANSETRON HCL 4 MG PO TABS: 4.0000 mg | ORAL_TABLET | Freq: Four times a day (QID) | ORAL | Status: DC | PRN

## 2016-06-10 MED ORDER — OXYCODONE HCL 5 MG PO TABS
5.0000 mg | ORAL_TABLET | ORAL | Status: DC | PRN
  Administered 2016-06-10 (×3): 5 mg via ORAL
  Filled 2016-06-10 (×3): qty 1

## 2016-06-10 MED ORDER — SITAGLIPTIN PHOS-METFORMIN HCL 50-1000 MG PO TABS: 1.0000 | ORAL_TABLET | Freq: Two times a day (BID) | ORAL | Status: DC

## 2016-06-10 MED ORDER — ENOXAPARIN SODIUM 40 MG/0.4ML ~~LOC~~ SOLN: 40.0000 mg | SUBCUTANEOUS | Status: DC

## 2016-06-10 MED ORDER — ACETAMINOPHEN 650 MG RE SUPP: 650.0000 mg | Freq: Four times a day (QID) | RECTAL | Status: DC | PRN

## 2016-06-10 MED ORDER — METOCLOPRAMIDE HCL 5 MG PO TABS: 5.0000 mg | ORAL_TABLET | Freq: Three times a day (TID) | ORAL | Status: DC | PRN

## 2016-06-10 MED ORDER — SENNA 8.6 MG PO TABS
1.0000 | ORAL_TABLET | Freq: Two times a day (BID) | ORAL | Status: DC
  Filled 2016-06-10: qty 1

## 2016-06-10 MED ORDER — METOCLOPRAMIDE HCL 5 MG/ML IJ SOLN: 10.0000 mg | Freq: Three times a day (TID) | INTRAMUSCULAR | Status: DC | PRN

## 2016-06-10 MED ORDER — DOCUSATE SODIUM 100 MG PO CAPS
100.0000 mg | ORAL_CAPSULE | Freq: Two times a day (BID) | ORAL | Status: DC
  Filled 2016-06-10: qty 1

## 2016-06-10 MED ORDER — ACETAMINOPHEN 325 MG PO TABS
650.0000 mg | ORAL_TABLET | Freq: Four times a day (QID) | ORAL | Status: DC | PRN
  Administered 2016-06-10: 650 mg via ORAL
  Filled 2016-06-10: qty 2

## 2016-06-10 MED ORDER — ONDANSETRON HCL 4 MG/2ML IJ SOLN: 4.0000 mg | Freq: Four times a day (QID) | INTRAMUSCULAR | Status: DC | PRN

## 2016-06-10 NOTE — Transfer of Care (Signed)
Immediate Anesthesia Transfer of Care Note  Patient: Stanley Comas MD  Procedure(s) Performed: Procedure(s): CLOSED REDUCTION PATELLAR (Right)  Patient Location: PACU  Anesthesia Type:General  Level of Consciousness: awake, alert , oriented and patient cooperative  Airway & Oxygen Therapy: Patient Spontanous Breathing and Patient connected to face mask oxygen  Post-op Assessment: Report given to RN, Post -op Vital signs reviewed and stable and Patient moving all extremities  Post vital signs: Reviewed and stable  Last Vitals:  Filed Vitals:   06/09/16 2126  BP: 137/63  Pulse: 82  Temp: 36.7 C  Resp: 18    Last Pain:  Filed Vitals:   06/09/16 2127  PainSc: 0-No pain         Complications: No apparent anesthesia complications

## 2016-06-10 NOTE — Anesthesia Procedure Notes (Signed)
Date/Time: 06/09/2016 11:50 PM Performed by: Carleene Cooper A Pre-anesthesia Checklist: Patient identified, Timeout performed, Emergency Drugs available, Suction available and Patient being monitored Patient Re-evaluated:Patient Re-evaluated prior to inductionOxygen Delivery Method: Circle system utilized Preoxygenation: Pre-oxygenation with 100% oxygen Intubation Type: IV induction Ventilation: Mask ventilation without difficulty and Oral airway inserted - appropriate to patient size Dental Injury: Teeth and Oropharynx as per pre-operative assessment  Comments: Mask ventilation with ease during the procedure.

## 2016-06-10 NOTE — Progress Notes (Signed)
PACU note---pt discussed with his wife his fears over going home tonight; called Dr. Wynelle Link and OK for pt to spend night in hospital; he will put in orders; pt and wife agreeable

## 2016-06-10 NOTE — Progress Notes (Signed)
Pt's vitals are WNL, tolerating diet and pain is controlled. Discussed discharge instructions with both patient and wife. Discharged to home with wheelchair.

## 2016-06-10 NOTE — Anesthesia Postprocedure Evaluation (Signed)
Anesthesia Post Note  Patient: [name] MD  Procedure(s) Performed: Procedure(s) (LRB): CLOSED REDUCTION PATELLAR (Right)  Patient location during evaluation: PACU Anesthesia Type: General Level of consciousness: awake Pain management: pain level controlled Respiratory status: spontaneous breathing Cardiovascular status: stable Anesthetic complications: no    Last Vitals:  Filed Vitals:   06/09/16 2126 06/10/16 0009  BP: 137/63 125/60  Pulse: 82 55  Temp: 36.7 C 36.4 C  Resp: 18 18    Last Pain:  Filed Vitals:   06/10/16 0026  PainSc: 0-No pain                 EDWARDS,Lashika Erker

## 2016-06-10 NOTE — Progress Notes (Signed)
   Subjective: 1 Day Post-Op Procedure(s) (LRB): CLOSED REDUCTION PATELLAR (Right) Patient reports pain as mild.   Patient seen in rounds with Dr. Wynelle Link. Patient is well, and has had no acute complaints or problems. Reports that he is feeling back to his "usual self"   Objective: Vital signs in last 24 hours: Temp:  [97.3 F (36.3 C)-98.3 F (36.8 C)] 97.5 F (36.4 C) (06/22 0445) Pulse Rate:  [51-82] 51 (06/22 0445) Resp:  [14-18] 16 (06/22 0445) BP: (119-137)/(40-75) 130/75 mmHg (06/22 0445) SpO2:  [95 %-98 %] 97 % (06/22 0445) Weight:  [81.647 kg (180 lb)] 81.647 kg (180 lb) (06/22 0218)  Intake/Output from previous day:  Intake/Output Summary (Last 24 hours) at 06/10/16 0742 Last data filed at 06/10/16 0740  Gross per 24 hour  Intake   1050 ml  Output   1200 ml  Net   -150 ml     EXAM General - Patient is Alert and Oriented Extremity - Neurologically intact Intact pulses distally Dorsiflexion/Plantar flexion intact Motor Function - intact, moving foot and toes well on exam   Past Medical History  Diagnosis Date  . Diabetes mellitus   . BPH (benign prostatic hyperplasia)   . Arthritis   . GERD (gastroesophageal reflux disease)   . Hypertension     pt denies  . Shortness of breath     uses cpap  . Peripheral vascular disease ([place]) 03-06-12    aortogram showed severe bilateral unreconstructible tibial artery disease  . Failed total knee, right ([place])   . H pylori ulcer     Gastric ulcer  . CAD (coronary artery disease)  s/p CABG     sees Dr. Loralie Champagne   . Aortic stenosis     S/p pericardial valve  . Mobitz type 1 second degree atrioventricular block   . Shingles 05-12-12  . Lumbar spinal stenosis     sees Dr. Sherwood Gambler   . Anemia     sees Dr. Julien Nordmann     Assessment/Plan: 1 Day Post-Op Procedure(s) (LRB): CLOSED REDUCTION PATELLAR (Right) Active Problems:   Dislocation closed, knee   Right knee dislocation  Estimated body mass index is 27.38  kg/(m^2) as calculated from the following:   Height as of this encounter: 5\' 8"  (1.727 m).   Weight as of this encounter: 81.647 kg (180 lb). Advance diet D/C IV fluids  Will DC home today. Instructions discussed. Follow up with Dr. Gladstone Lighter. Wheelchair ordered for home.   Ardeen Jourdain, PA-C Orthopaedic Surgery 06/10/2016, 7:42 AM

## 2016-06-10 NOTE — Progress Notes (Signed)
Received order for wheelchair, contacted [facility] to deliver. No need for [facility] services, has all other DME that he needs.

## 2016-06-10 NOTE — Op Note (Signed)
NAME:  [name] MDJayzeon, [place] NO.:  [id number]  MEDICAL RECORD NO.:  [id number]  LOCATION:  [facility]                         FACILITY:  [facility]  PHYSICIAN:  Gaynelle Arabian, M.D.    DATE OF BIRTH:  April 15, 1925  DATE OF PROCEDURE:  06/09/2016 DATE OF DISCHARGE:                              OPERATIVE REPORT   PREOPERATIVE DIAGNOSIS:  Dislocated right total knee arthroplasty.  POSTOPERATIVE DIAGNOSIS:  Dislocated right total knee arthroplasty.  PROCEDURE:  Closed reduction of right total knee arthroplasty dislocation.  SURGEON:  Gaynelle Arabian, M.D.  ASSISTANT:  None.  ANESTHESIA:  General.  COMPLICATIONS:  None.  CONDITION:  Stable to recovery.  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  He had a dislocation also 2 weeks ago treated with close reduction.  He presented to the emergency room tonight.  Neurovascular intact.  I was consulted for management and was taken to the operating room now for closed reduction under general anesthetic.  PROCEDURE IN DETAIL:  After successful administration of IV and inhalation agents, time-out was called, and exam under anesthesia shows a posterior dislocation of the right knee.  Using combination of traction and extension, I was able to reduce this with both visible and palpable reduction.  Then, flexed his knee to about 60 degrees and fully extended, and the knee was stable.  We then placed him back into his hinged brace.  I again tested the knee, and it remained reduced.  He was subsequently awakened and transported to recovery in stable condition.     Gaynelle Arabian, M.D.     FA/MEDQ  D:  06/09/2016  T:  06/10/2016  Job:  [id number]

## 2016-06-11 NOTE — Discharge Summary (Signed)
Physician Discharge Summary   Patient ID: Stanley ARDIS MD MRN: [id number] DOB/AGE: [birth year] 80 y.o.  Admit date: 06/09/2016 Discharge date: 06/10/2016  Primary Diagnosis: Closed dislocation right total knee  Admission Diagnoses:  Past Medical History  Diagnosis Date  . Diabetes mellitus   . BPH (benign prostatic hyperplasia)   . Arthritis   . GERD (gastroesophageal reflux disease)   . Hypertension     pt denies  . Shortness of breath     uses cpap  . Peripheral vascular disease ([place]) 03-06-12    aortogram showed severe bilateral unreconstructible tibial artery disease  . Failed total knee, right ([place])   . H pylori ulcer     Gastric ulcer  . CAD (coronary artery disease)  s/p CABG     sees Dr. Loralie Champagne   . Aortic stenosis     S/p pericardial valve  . Mobitz type 1 second degree atrioventricular block   . Shingles 05-12-12  . Lumbar spinal stenosis     sees Dr. Sherwood Gambler   . Anemia     sees Dr. Julien Nordmann    Discharge Diagnoses:   Active Problems:   Dislocation closed, knee   Right knee dislocation  Estimated body mass index is 27.38 kg/(m^2) as calculated from the following:   Height as of this encounter: 5\' 8"  (1.727 m).   Weight as of this encounter: 81.647 kg (180 lb).  Procedure:  Procedure(s) (LRB): CLOSED REDUCTION PATELLAR (Right)   Consults: None  HPI: 82 yo male who has a long history of problems with his right knee. 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 He fell on the knee tonight and sustained a dislocation. This is his second dislocation due to a fall in the past 2 weeks.He has knee pain but is able to move his right foot.  Laboratory Data: Admission on 06/09/2016, Discharged on 06/10/2016  Component Date Value Ref Range Status  . Glucose-Capillary 06/10/2016 162* 65 - 99 mg/dL Final  . Glucose-Capillary 06/10/2016 183* 65 - 99 mg/dL Final  . Sodium 06/09/2016 136  135 - 145 mmol/L Final  .  Potassium 06/09/2016 4.5  3.5 - 5.1 mmol/L Final  . Glucose, Bld 06/09/2016 173* 65 - 99 mg/dL Final  . HCT 06/09/2016 33.0* 39.0 - 52.0 % Final  . Hemoglobin 06/09/2016 11.2* 13.0 - 17.0 g/dL Final  . Glucose-Capillary 06/10/2016 233* 65 - 99 mg/dL Final  Admission on 05/29/2016, Discharged on 05/30/2016  Component Date Value Ref Range Status  . Sodium 05/29/2016 135  135 - 145 mmol/L Final  . Potassium 05/29/2016 5.2* 3.5 - 5.1 mmol/L Final  . Chloride 05/29/2016 104  101 - 111 mmol/L Final  . BUN 05/29/2016 42* 6 - 20 mg/dL Final  . Creatinine, Ser 05/29/2016 0.90  0.61 - 1.24 mg/dL Final  . Glucose, Bld 05/29/2016 227* 65 - 99 mg/dL Final  . Calcium, Ion 05/29/2016 1.18  1.13 - 1.30 mmol/L Final  . TCO2 05/29/2016 20  0 - 100 mmol/L Final  . Hemoglobin 05/29/2016 10.9* 13.0 - 17.0 g/dL Final  . HCT 05/29/2016 32.0* 39.0 - 52.0 % Final  . ABO/RH(D) 05/29/2016 O POS   Final  . Antibody Screen 05/29/2016 NEG   Final  . Sample Expiration 05/29/2016 06/01/2016   Final  . Glucose-Capillary 05/29/2016 178* 65 - 99 mg/dL Final  . Glucose-Capillary 05/30/2016 185* 65 - 99 mg/dL Final  . Glucose-Capillary 05/30/2016 197* 65 - 99 mg/dL Final  Appointment on 05/04/2016  Component Date Value Ref Range Status  . WBC 05/04/2016 8.8  4.0 - 10.3 10e3/uL Final  . NEUT# 05/04/2016 6.3  1.5 - 6.5 10e3/uL Final  . HGB 05/04/2016 10.0* 13.0 - 17.1 g/dL Final  . HCT 05/04/2016 30.4* 38.4 - 49.9 % Final  . Platelets 05/04/2016 251  140 - 400 10e3/uL Final  . MCV 05/04/2016 93.8  79.3 - 98.0 fL Final  . MCH 05/04/2016 30.9  27.2 - 33.4 pg Final  . MCHC 05/04/2016 32.9  32.0 - 36.0 g/dL Final  . RBC 05/04/2016 3.24* 4.20 - 5.82 10e6/uL Final  . RDW 05/04/2016 13.9  11.0 - 14.6 % Final  . lymph# 05/04/2016 1.2  0.9 - 3.3 10e3/uL Final  . MONO# 05/04/2016 1.1* 0.1 - 0.9 10e3/uL Final  . Eosinophils Absolute 05/04/2016 0.2  0.0 - 0.5 10e3/uL Final  . Basophils Absolute 05/04/2016 0.0  0.0 - 0.1 10e3/uL  Final  . NEUT% 05/04/2016 71.6  39.0 - 75.0 % Final  . LYMPH% 05/04/2016 13.7* 14.0 - 49.0 % Final  . MONO% 05/04/2016 12.2  0.0 - 14.0 % Final  . EOS% 05/04/2016 2.0  0.0 - 7.0 % Final  . BASO% 05/04/2016 0.5  0.0 - 2.0 % Final  . Ferritin 05/04/2016 274  22 - 316 ng/ml Final  . Iron 05/04/2016 43  42 - 163 ug/dL Final  . TIBC 05/04/2016 280  202 - 409 ug/dL Final  . UIBC 05/04/2016 237  117 - 376 ug/dL Final  . %SAT 05/04/2016 16* 20 - 55 % Final     X-Rays:Dg Knee Complete 4 Views Right  06/09/2016  CLINICAL DATA:  Initial encounter for Pt had his right knee dislocated last Saturday and tonight he fell again tonight at home and thinks he may have dislocated it again. Hx multiple surgeries EXAM: RIGHT KNEE - COMPLETE 4+ VIEW COMPARISON:  6/10/ 17 FINDINGS: Long-stem right knee arthroplasty. Vascular calcifications. Posterior dislocation of the tibial component relative to the femoral component. No knee joint effusion. No complicating fracture. IMPRESSION: Posterior dislocation of the tibial component of a total knee arthroplasty. Electronically Signed   By: Abigail Miyamoto M.D.   On: 06/09/2016 21:52   Dg Knee Complete 4 Views Right  05/29/2016  CLINICAL DATA:  Previous knee replacement. [place] today with sensation of dislocation. EXAM: RIGHT KNEE - COMPLETE 4+ VIEW COMPARISON:  None. FINDINGS: There is dislocation of the femoral component anteriorly relative to the tibial component. No acute fracture evident. IMPRESSION: Anterior dislocation of the femoral component relative to the tibial component. Electronically Signed   By: Nelson Chimes M.D.   On: 05/29/2016 16:18   Dg C-arm 1-60 Min  05/29/2016  CLINICAL DATA:  Reduction of right knee dislocation EXAM: DG C-ARM 61-120 MIN; RIGHT KNEE - 3 VIEW COMPARISON:  Right knee radiographs from earlier today FINDINGS: Fluoroscopy time 0 minutes 3 seconds. Two nondiagnostic spot fluoroscopic intraoperative radiographs demonstrate right total knee  arthroplasty with apparent successful reduction of the right knee dislocation on the provided views. IMPRESSION: Intraoperative fluoroscopic guidance for reduction of right knee dislocation. Electronically Signed   By: Ilona Sorrel M.D.   On: 05/29/2016 20:09   Dg Knee 2 Views Right  05/29/2016  CLINICAL DATA:  Reduction of right knee dislocation EXAM: DG C-ARM 61-120 MIN; RIGHT KNEE - 3 VIEW COMPARISON:  Right knee radiographs from earlier today FINDINGS: Fluoroscopy time 0 minutes 3 seconds. Two nondiagnostic spot fluoroscopic intraoperative radiographs demonstrate right total knee  arthroplasty with apparent successful reduction of the right knee dislocation on the provided views. IMPRESSION: Intraoperative fluoroscopic guidance for reduction of right knee dislocation. Electronically Signed   By: Ilona Sorrel M.D.   On: 05/29/2016 20:09    EKG: Orders placed or performed during the hospital encounter of 05/29/16  . ED EKG  . ED EKG  . 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. They were brought to the operating room on 06/09/2016 - 06/10/2016 and underwent Procedure(s): CLOSED REDUCTION PATELLAR.  Patient tolerated the procedure well and was later transferred to the recovery room and then to the orthopaedic floor for postoperative care.  They were given PO and IV analgesics for pain control following their surgery. Patient had a fair night on the evening of surgery. Patient was seen in rounds and was ready to go home.   Diet: Cardiac diet Activity:WBAT Follow-up:in 2 weeks Disposition - Home Discharged Condition: stable   Discharge Instructions    Call MD / Call 911    Complete by:  As directed   If you experience chest pain or shortness of breath, CALL 911 and be transported to the hospital emergency room.  If you develope a fever above 101 F, pus (white drainage) or increased drainage or redness at the wound, or calf pain, call  your surgeon's office.     Call MD / Call 911    Complete by:  As directed   If you experience chest pain or shortness of breath, CALL 911 and be transported to the hospital emergency room.  If you develope a fever above 101 F, pus (white drainage) or increased drainage or redness at the wound, or calf pain, call your surgeon's office.     Constipation Prevention    Complete by:  As directed   Drink plenty of fluids.  Prune juice may be helpful.  You may use a stool softener, such as Colace (over the counter) 100 mg twice a day.  Use MiraLax (over the counter) for constipation as needed.     Diet - low sodium heart healthy    Complete by:  As directed      Discharge instructions    Complete by:  As directed   [place]!!!     Increase activity slowly as tolerated    Complete by:  As directed      Increase activity slowly as tolerated    Complete by:  As directed             Medication List    TAKE these medications        aspirin 81 MG tablet  Take 81 mg by mouth daily.     calcium-vitamin D 500-200 MG-UNIT tablet  Take 1 tablet by mouth 2 (two) times daily.     carbidopa-levodopa 25-100 MG tablet  Commonly known as:  SINEMET  Take 1-2 tablets by mouth See admin instructions. 2 in the AM, 1 in the afternoon, 2 in the evening before meals     cephALEXin 500 MG capsule  Commonly known as:  KEFLEX  Take 1 capsule (500 mg total) by mouth 3 (three) times daily.     chlorhexidine 0.12 % solution  Commonly known as:  PERIDEX  Use as directed 15 mLs in the mouth or throat 2 (two) times daily.     diclofenac 75 MG EC tablet  Commonly known  as:  VOLTAREN  TAKE 1 TABLET TWICE A DAY     finasteride 5 MG tablet  Commonly known as:  PROSCAR  Take 5 mg by mouth daily.     glucose blood test strip  Commonly known as:  ONE TOUCH TEST STRIPS  Dispense one touch ultra, test TID and diagnosis code is E11.9     HYDROcodone-acetaminophen 10-325 MG tablet  Commonly  known as:  NORCO  Take 1 tablet by mouth every 6 (six) hours as needed for severe pain.     INTEGRA PLUS Caps  TAKE 1 CAPSULE EVERY MORNING     LORazepam 0.5 MG tablet  Commonly known as:  ATIVAN  Take 1 tablet (0.5 mg total) by mouth every 8 (eight) hours as needed for anxiety.     multivitamin with minerals Tabs tablet  Take 1 tablet by mouth daily.     MYRBETRIQ 50 MG Tb24 tablet  Generic drug:  mirabegron ER  Take 50 mg by mouth daily.     onetouch ultrasoft lancets  Test once per day and diagnosis code is E 11.9     pantoprazole 40 MG tablet  Commonly known as:  PROTONIX  Take 1 tablet (40 mg total) by mouth 2 (two) times daily.     pioglitazone 45 MG tablet  Commonly known as:  ACTOS  Take 1 tablet (45 mg total) by mouth daily.     rosuvastatin 10 MG tablet  Commonly known as:  CRESTOR  Take 1 tablet (10 mg total) by mouth daily.     sitaGLIPtin-metformin 50-1000 MG tablet  Commonly known as:  JANUMET  Take 1 tablet by mouth 2 (two) times daily with a meal. take 1 tablet by mouth twice a day WITH A MEAL.     tamsulosin 0.4 MG Caps capsule  Commonly known as:  FLOMAX  Take 0.4 mg by mouth daily after breakfast.     traMADol 50 MG tablet  Commonly known as:  ULTRAM  Take 50 mg by mouth every 12 (twelve) hours as needed for moderate pain.           Follow-up Information    Schedule an appointment as soon as possible for a visit with Tobi Bastos, MD.   Specialty:  Orthopedic Surgery   Contact information:   [address] [place] [address] [phone]       Signed: Ardeen Jourdain, PA-C Orthopaedic Surgery 06/11/2016, 8:50 AM

## 2016-06-15 ENCOUNTER — Ambulatory Visit: Payer: Medicare Other | Admitting: Cardiovascular Disease

## 2016-06-16 ENCOUNTER — Other Ambulatory Visit: Payer: Self-pay | Admitting: Family Medicine

## 2016-06-29 ENCOUNTER — Inpatient Hospital Stay (HOSPITAL_COMMUNITY)
Admission: EM | Admit: 2016-06-29 | Discharge: 2016-07-09 | DRG: 853 | Disposition: A | Discharge status: Skilled Nursing Facility | Payer: Medicare Other | Attending: Family Medicine | Admitting: Family Medicine

## 2016-06-29 ENCOUNTER — Encounter (HOSPITAL_COMMUNITY): Payer: Self-pay | Admitting: Emergency Medicine

## 2016-06-29 ENCOUNTER — Emergency Department (HOSPITAL_COMMUNITY): Payer: Medicare Other

## 2016-06-29 DIAGNOSIS — E039 Hypothyroidism, unspecified: Secondary | ICD-10-CM | POA: Diagnosis present

## 2016-06-29 DIAGNOSIS — G20A1 Parkinson's disease without dyskinesia, without mention of fluctuations: Secondary | ICD-10-CM | POA: Diagnosis present

## 2016-06-29 DIAGNOSIS — I25810 Atherosclerosis of coronary artery bypass graft(s) without angina pectoris: Secondary | ICD-10-CM | POA: Diagnosis present

## 2016-06-29 DIAGNOSIS — Z7982 Long term (current) use of aspirin: Secondary | ICD-10-CM

## 2016-06-29 DIAGNOSIS — B379 Candidiasis, unspecified: Secondary | ICD-10-CM | POA: Diagnosis not present

## 2016-06-29 DIAGNOSIS — E1165 Type 2 diabetes mellitus with hyperglycemia: Secondary | ICD-10-CM | POA: Diagnosis present

## 2016-06-29 DIAGNOSIS — A419 Sepsis, unspecified organism: Secondary | ICD-10-CM | POA: Diagnosis present

## 2016-06-29 DIAGNOSIS — Y792 Prosthetic and other implants, materials and accessory orthopedic devices associated with adverse incidents: Secondary | ICD-10-CM | POA: Diagnosis present

## 2016-06-29 DIAGNOSIS — I330 Acute and subacute infective endocarditis: Secondary | ICD-10-CM | POA: Insufficient documentation

## 2016-06-29 DIAGNOSIS — S81801A Unspecified open wound, right lower leg, initial encounter: Secondary | ICD-10-CM | POA: Insufficient documentation

## 2016-06-29 DIAGNOSIS — R296 Repeated falls: Secondary | ICD-10-CM | POA: Diagnosis present

## 2016-06-29 DIAGNOSIS — N390 Urinary tract infection, site not specified: Secondary | ICD-10-CM | POA: Diagnosis present

## 2016-06-29 DIAGNOSIS — R509 Fever, unspecified: Secondary | ICD-10-CM | POA: Insufficient documentation

## 2016-06-29 DIAGNOSIS — R6521 Severe sepsis with septic shock: Secondary | ICD-10-CM | POA: Insufficient documentation

## 2016-06-29 DIAGNOSIS — Z888 Allergy status to other drugs, medicaments and biological substances status: Secondary | ICD-10-CM

## 2016-06-29 DIAGNOSIS — D509 Iron deficiency anemia, unspecified: Secondary | ICD-10-CM | POA: Diagnosis present

## 2016-06-29 DIAGNOSIS — R350 Frequency of micturition: Secondary | ICD-10-CM | POA: Diagnosis present

## 2016-06-29 DIAGNOSIS — Z792 Long term (current) use of antibiotics: Secondary | ICD-10-CM

## 2016-06-29 DIAGNOSIS — R8299 Other abnormal findings in urine: Secondary | ICD-10-CM | POA: Diagnosis not present

## 2016-06-29 DIAGNOSIS — I110 Hypertensive heart disease with heart failure: Secondary | ICD-10-CM | POA: Diagnosis present

## 2016-06-29 DIAGNOSIS — N401 Enlarged prostate with lower urinary tract symptoms: Secondary | ICD-10-CM | POA: Diagnosis present

## 2016-06-29 DIAGNOSIS — E119 Type 2 diabetes mellitus without complications: Secondary | ICD-10-CM

## 2016-06-29 DIAGNOSIS — M1990 Unspecified osteoarthritis, unspecified site: Secondary | ICD-10-CM | POA: Diagnosis present

## 2016-06-29 DIAGNOSIS — Y712 Prosthetic and other implants, materials and accessory cardiovascular devices associated with adverse incidents: Secondary | ICD-10-CM | POA: Diagnosis not present

## 2016-06-29 DIAGNOSIS — S81801D Unspecified open wound, right lower leg, subsequent encounter: Secondary | ICD-10-CM | POA: Diagnosis not present

## 2016-06-29 DIAGNOSIS — L8990 Pressure ulcer of unspecified site, unspecified stage: Secondary | ICD-10-CM | POA: Diagnosis present

## 2016-06-29 DIAGNOSIS — Z952 Presence of prosthetic heart valve: Secondary | ICD-10-CM

## 2016-06-29 DIAGNOSIS — R32 Unspecified urinary incontinence: Secondary | ICD-10-CM | POA: Diagnosis present

## 2016-06-29 DIAGNOSIS — I5022 Chronic systolic (congestive) heart failure: Secondary | ICD-10-CM | POA: Diagnosis present

## 2016-06-29 DIAGNOSIS — Z6824 Body mass index (BMI) 24.0-24.9, adult: Secondary | ICD-10-CM | POA: Diagnosis not present

## 2016-06-29 DIAGNOSIS — L97811 Non-pressure chronic ulcer of other part of right lower leg limited to breakdown of skin: Secondary | ICD-10-CM | POA: Diagnosis present

## 2016-06-29 DIAGNOSIS — Z96653 Presence of artificial knee joint, bilateral: Secondary | ICD-10-CM | POA: Diagnosis present

## 2016-06-29 DIAGNOSIS — Z833 Family history of diabetes mellitus: Secondary | ICD-10-CM

## 2016-06-29 DIAGNOSIS — R7881 Bacteremia: Secondary | ICD-10-CM | POA: Insufficient documentation

## 2016-06-29 DIAGNOSIS — M009 Pyogenic arthritis, unspecified: Secondary | ICD-10-CM | POA: Diagnosis present

## 2016-06-29 DIAGNOSIS — I10 Essential (primary) hypertension: Secondary | ICD-10-CM | POA: Diagnosis not present

## 2016-06-29 DIAGNOSIS — E1151 Type 2 diabetes mellitus with diabetic peripheral angiopathy without gangrene: Secondary | ICD-10-CM | POA: Diagnosis present

## 2016-06-29 DIAGNOSIS — Z954 Presence of other heart-valve replacement: Secondary | ICD-10-CM | POA: Diagnosis not present

## 2016-06-29 DIAGNOSIS — T826XXA Infection and inflammatory reaction due to cardiac valve prosthesis, initial encounter: Secondary | ICD-10-CM | POA: Insufficient documentation

## 2016-06-29 DIAGNOSIS — I340 Nonrheumatic mitral (valve) insufficiency: Secondary | ICD-10-CM | POA: Diagnosis not present

## 2016-06-29 DIAGNOSIS — Z8249 Family history of ischemic heart disease and other diseases of the circulatory system: Secondary | ICD-10-CM

## 2016-06-29 DIAGNOSIS — T84099A Other mechanical complication of unspecified internal joint prosthesis, initial encounter: Secondary | ICD-10-CM | POA: Diagnosis present

## 2016-06-29 DIAGNOSIS — I081 Rheumatic disorders of both mitral and tricuspid valves: Secondary | ICD-10-CM | POA: Diagnosis present

## 2016-06-29 DIAGNOSIS — Z79899 Other long term (current) drug therapy: Secondary | ICD-10-CM

## 2016-06-29 DIAGNOSIS — Y831 Surgical operation with implant of artificial internal device as the cause of abnormal reaction of the patient, or of later complication, without mention of misadventure at the time of the procedure: Secondary | ICD-10-CM | POA: Diagnosis present

## 2016-06-29 DIAGNOSIS — M00061 Staphylococcal arthritis, right knee: Secondary | ICD-10-CM | POA: Diagnosis not present

## 2016-06-29 DIAGNOSIS — B9562 Methicillin resistant Staphylococcus aureus infection as the cause of diseases classified elsewhere: Secondary | ICD-10-CM | POA: Diagnosis present

## 2016-06-29 DIAGNOSIS — Z885 Allergy status to narcotic agent status: Secondary | ICD-10-CM

## 2016-06-29 DIAGNOSIS — B957 Other staphylococcus as the cause of diseases classified elsewhere: Secondary | ICD-10-CM | POA: Insufficient documentation

## 2016-06-29 DIAGNOSIS — I441 Atrioventricular block, second degree: Secondary | ICD-10-CM | POA: Diagnosis present

## 2016-06-29 DIAGNOSIS — G20 Parkinson's disease: Secondary | ICD-10-CM | POA: Diagnosis present

## 2016-06-29 DIAGNOSIS — L03115 Cellulitis of right lower limb: Secondary | ICD-10-CM | POA: Diagnosis present

## 2016-06-29 DIAGNOSIS — K219 Gastro-esophageal reflux disease without esophagitis: Secondary | ICD-10-CM | POA: Diagnosis present

## 2016-06-29 DIAGNOSIS — A4101 Sepsis due to Methicillin susceptible Staphylococcus aureus: Secondary | ICD-10-CM | POA: Diagnosis not present

## 2016-06-29 DIAGNOSIS — Z8711 Personal history of peptic ulcer disease: Secondary | ICD-10-CM

## 2016-06-29 DIAGNOSIS — M4806 Spinal stenosis, lumbar region: Secondary | ICD-10-CM | POA: Diagnosis present

## 2016-06-29 DIAGNOSIS — Z7984 Long term (current) use of oral hypoglycemic drugs: Secondary | ICD-10-CM

## 2016-06-29 DIAGNOSIS — G4733 Obstructive sleep apnea (adult) (pediatric): Secondary | ICD-10-CM | POA: Diagnosis present

## 2016-06-29 DIAGNOSIS — I38 Endocarditis, valve unspecified: Secondary | ICD-10-CM

## 2016-06-29 LAB — URINE MICROSCOPIC-ADD ON: RBC / HPF: NONE SEEN RBC/hpf (ref 0–5)

## 2016-06-29 LAB — CBC WITH DIFFERENTIAL/PLATELET
BASOS PCT: 0 %
Basophils Absolute: 0 10*3/uL (ref 0.0–0.1)
EOS ABS: 0 10*3/uL (ref 0.0–0.7)
EOS PCT: 0 %
HCT: 31.4 % — ABNORMAL LOW (ref 39.0–52.0)
HEMOGLOBIN: 10.5 g/dL — AB (ref 13.0–17.0)
LYMPHS PCT: 6 %
Lymphs Abs: 0.5 10*3/uL — ABNORMAL LOW (ref 0.7–4.0)
MCH: 31.8 pg (ref 26.0–34.0)
MCHC: 33.4 g/dL (ref 30.0–36.0)
MCV: 95.2 fL (ref 78.0–100.0)
MONO ABS: 1 10*3/uL (ref 0.1–1.0)
Monocytes Relative: 12 %
NEUTROS ABS: 6.7 10*3/uL (ref 1.7–7.7)
Neutrophils Relative %: 82 %
Platelets: 164 10*3/uL (ref 150–400)
RBC: 3.3 MIL/uL — ABNORMAL LOW (ref 4.22–5.81)
RDW: 14.4 % (ref 11.5–15.5)
WBC: 8.2 10*3/uL (ref 4.0–10.5)

## 2016-06-29 LAB — COMPREHENSIVE METABOLIC PANEL
ALT: 6 U/L — ABNORMAL LOW (ref 17–63)
ANION GAP: 8 (ref 5–15)
AST: 37 U/L (ref 15–41)
Albumin: 3.2 g/dL — ABNORMAL LOW (ref 3.5–5.0)
Alkaline Phosphatase: 78 U/L (ref 38–126)
BILIRUBIN TOTAL: 0.5 mg/dL (ref 0.3–1.2)
BUN: 31 mg/dL — AB (ref 6–20)
CALCIUM: 8.4 mg/dL — AB (ref 8.9–10.3)
CO2: 22 mmol/L (ref 22–32)
Chloride: 103 mmol/L (ref 101–111)
Creatinine, Ser: 0.8 mg/dL (ref 0.61–1.24)
Glucose, Bld: 225 mg/dL — ABNORMAL HIGH (ref 65–99)
POTASSIUM: 4 mmol/L (ref 3.5–5.1)
Sodium: 133 mmol/L — ABNORMAL LOW (ref 135–145)
TOTAL PROTEIN: 6.7 g/dL (ref 6.5–8.1)

## 2016-06-29 LAB — URINALYSIS, ROUTINE W REFLEX MICROSCOPIC
Bilirubin Urine: NEGATIVE
GLUCOSE, UA: 100 mg/dL — AB
HGB URINE DIPSTICK: NEGATIVE
Ketones, ur: NEGATIVE mg/dL
LEUKOCYTES UA: NEGATIVE
Nitrite: NEGATIVE
PH: 5.5 (ref 5.0–8.0)
Protein, ur: 100 mg/dL — AB
Specific Gravity, Urine: 1.026 (ref 1.005–1.030)

## 2016-06-29 LAB — I-STAT CG4 LACTIC ACID, ED: Lactic Acid, Venous: 1 mmol/L (ref 0.5–1.9)

## 2016-06-29 LAB — GLUCOSE, CAPILLARY
GLUCOSE-CAPILLARY: 313 mg/dL — AB (ref 65–99)
GLUCOSE-CAPILLARY: 260 mg/dL — AB (ref 65–99)
Glucose-Capillary: 236 mg/dL — ABNORMAL HIGH (ref 65–99)

## 2016-06-29 LAB — MRSA PCR SCREENING: MRSA by PCR: NEGATIVE

## 2016-06-29 MED ORDER — VANCOMYCIN HCL IN DEXTROSE 1-5 GM/200ML-% IV SOLN
1000.0000 mg | Freq: Once | INTRAVENOUS | Status: AC
  Administered 2016-06-29: 1000 mg via INTRAVENOUS
  Filled 2016-06-29: qty 200

## 2016-06-29 MED ORDER — CALCIUM CARBONATE-VITAMIN D 500-200 MG-UNIT PO TABS
1.0000 | ORAL_TABLET | Freq: Two times a day (BID) | ORAL | Status: DC
  Administered 2016-06-29 – 2016-07-09 (×20): 1 via ORAL
  Filled 2016-06-29 (×28): qty 1

## 2016-06-29 MED ORDER — TRAMADOL HCL 50 MG PO TABS
50.0000 mg | ORAL_TABLET | Freq: Two times a day (BID) | ORAL | Status: DC | PRN
  Administered 2016-07-01 – 2016-07-03 (×2): 50 mg via ORAL
  Filled 2016-06-29 (×2): qty 1

## 2016-06-29 MED ORDER — ENOXAPARIN SODIUM 40 MG/0.4ML ~~LOC~~ SOLN
40.0000 mg | SUBCUTANEOUS | Status: DC
  Administered 2016-06-29 – 2016-07-08 (×10): 40 mg via SUBCUTANEOUS
  Filled 2016-06-29 (×10): qty 0.4

## 2016-06-29 MED ORDER — ACETAMINOPHEN 325 MG PO TABS: 650.0000 mg | ORAL_TABLET | Freq: Once | ORAL | Status: DC

## 2016-06-29 MED ORDER — PANTOPRAZOLE SODIUM 40 MG PO TBEC
40.0000 mg | DELAYED_RELEASE_TABLET | Freq: Two times a day (BID) | ORAL | Status: DC
  Administered 2016-06-29 – 2016-07-09 (×21): 40 mg via ORAL
  Filled 2016-06-29 (×21): qty 1

## 2016-06-29 MED ORDER — FINASTERIDE 5 MG PO TABS
5.0000 mg | ORAL_TABLET | Freq: Every day | ORAL | Status: DC
Start: 2016-06-29 — End: 2016-07-09
  Administered 2016-06-29 – 2016-07-09 (×11): 5 mg via ORAL
  Filled 2016-06-29 (×11): qty 1

## 2016-06-29 MED ORDER — SODIUM CHLORIDE 0.9 % IV SOLN
INTRAVENOUS | Status: DC
  Administered 2016-06-29 – 2016-07-01 (×4): via INTRAVENOUS

## 2016-06-29 MED ORDER — ZINC SULFATE 220 (50 ZN) MG PO CAPS
220.0000 mg | ORAL_CAPSULE | Freq: Every day | ORAL | Status: DC
  Administered 2016-06-29 – 2016-07-09 (×11): 220 mg via ORAL
  Filled 2016-06-29 (×11): qty 1

## 2016-06-29 MED ORDER — CARBIDOPA-LEVODOPA 25-100 MG PO TABS: 2.0000 | ORAL_TABLET | Freq: Four times a day (QID) | ORAL | Status: DC

## 2016-06-29 MED ORDER — ASPIRIN 81 MG PO CHEW
81.0000 mg | CHEWABLE_TABLET | Freq: Every day | ORAL | Status: DC
  Administered 2016-06-29 – 2016-07-09 (×11): 81 mg via ORAL
  Filled 2016-06-29 (×11): qty 1

## 2016-06-29 MED ORDER — ONDANSETRON HCL 4 MG/2ML IJ SOLN
4.0000 mg | Freq: Four times a day (QID) | INTRAMUSCULAR | Status: AC | PRN
  Administered 2016-06-29 – 2016-07-03 (×2): 4 mg via INTRAVENOUS
  Filled 2016-06-29 (×2): qty 2

## 2016-06-29 MED ORDER — INTEGRA PLUS PO CAPS
1.0000 | ORAL_CAPSULE | Freq: Every morning | ORAL | Status: DC
  Administered 2016-07-01 – 2016-07-09 (×9): 1 via ORAL

## 2016-06-29 MED ORDER — VITAMIN A 10000 UNITS PO CAPS: 10000.0000 [IU] | ORAL_CAPSULE | Freq: Every day | ORAL | Status: DC

## 2016-06-29 MED ORDER — ACETAMINOPHEN 325 MG PO TABS
650.0000 mg | ORAL_TABLET | Freq: Four times a day (QID) | ORAL | Status: DC | PRN
  Administered 2016-06-29: 650 mg via ORAL
  Filled 2016-06-29 (×2): qty 2

## 2016-06-29 MED ORDER — VANCOMYCIN HCL IN DEXTROSE 1-5 GM/200ML-% IV SOLN
1000.0000 mg | Freq: Two times a day (BID) | INTRAVENOUS | Status: DC
  Administered 2016-06-29 – 2016-07-03 (×8): 1000 mg via INTRAVENOUS
  Filled 2016-06-29 (×8): qty 200

## 2016-06-29 MED ORDER — PROCHLORPERAZINE MALEATE 10 MG PO TABS
10.0000 mg | ORAL_TABLET | Freq: Four times a day (QID) | ORAL | Status: DC | PRN
  Administered 2016-06-29 – 2016-06-30 (×3): 10 mg via ORAL
  Filled 2016-06-29 (×3): qty 1

## 2016-06-29 MED ORDER — TAMSULOSIN HCL 0.4 MG PO CAPS
0.4000 mg | ORAL_CAPSULE | Freq: Every day | ORAL | Status: DC
  Administered 2016-06-30 – 2016-07-09 (×10): 0.4 mg via ORAL
  Filled 2016-06-29 (×10): qty 1

## 2016-06-29 MED ORDER — HYDROCODONE-ACETAMINOPHEN 10-325 MG PO TABS
1.0000 | ORAL_TABLET | Freq: Four times a day (QID) | ORAL | Status: DC | PRN
  Administered 2016-06-29 – 2016-07-09 (×21): 1 via ORAL
  Filled 2016-06-29 (×21): qty 1

## 2016-06-29 MED ORDER — ADULT MULTIVITAMIN W/MINERALS CH
1.0000 | ORAL_TABLET | Freq: Every day | ORAL | Status: DC
  Administered 2016-06-29 – 2016-07-09 (×11): 1 via ORAL
  Filled 2016-06-29 (×11): qty 1

## 2016-06-29 MED ORDER — ACETAMINOPHEN 650 MG RE SUPP
650.0000 mg | Freq: Four times a day (QID) | RECTAL | Status: DC | PRN
Start: 2016-06-29 — End: 2016-07-09

## 2016-06-29 MED ORDER — SODIUM CHLORIDE 0.9 % IV BOLUS (SEPSIS)
500.0000 mL | Freq: Once | INTRAVENOUS | Status: AC
  Administered 2016-06-29: 500 mL via INTRAVENOUS

## 2016-06-29 MED ORDER — PIPERACILLIN-TAZOBACTAM 3.375 G IVPB
3.3750 g | Freq: Three times a day (TID) | INTRAVENOUS | Status: DC
  Administered 2016-06-29 – 2016-07-01 (×6): 3.375 g via INTRAVENOUS
  Filled 2016-06-29 (×6): qty 50

## 2016-06-29 MED ORDER — INSULIN ASPART 100 UNIT/ML ~~LOC~~ SOLN
0.0000 [IU] | Freq: Three times a day (TID) | SUBCUTANEOUS | Status: DC
  Administered 2016-06-29: 7 [IU] via SUBCUTANEOUS
  Administered 2016-06-29: 3 [IU] via SUBCUTANEOUS
  Administered 2016-06-30: 5 [IU] via SUBCUTANEOUS
  Administered 2016-06-30: 7 [IU] via SUBCUTANEOUS
  Administered 2016-06-30: 3 [IU] via SUBCUTANEOUS
  Administered 2016-07-01: 7 [IU] via SUBCUTANEOUS
  Administered 2016-07-01: 3 [IU] via SUBCUTANEOUS

## 2016-06-29 MED ORDER — ROSUVASTATIN CALCIUM 10 MG PO TABS
10.0000 mg | ORAL_TABLET | Freq: Every day | ORAL | Status: DC
Start: 2016-06-29 — End: 2016-07-09
  Administered 2016-06-29 – 2016-07-09 (×11): 10 mg via ORAL
  Filled 2016-06-29 (×11): qty 1

## 2016-06-29 MED ORDER — LORAZEPAM 0.5 MG PO TABS
0.5000 mg | ORAL_TABLET | Freq: Three times a day (TID) | ORAL | Status: DC | PRN
Start: 2016-06-29 — End: 2016-07-09
  Administered 2016-07-02 – 2016-07-08 (×7): 0.5 mg via ORAL
  Filled 2016-06-29 (×7): qty 1

## 2016-06-29 MED ORDER — PIPERACILLIN-TAZOBACTAM 3.375 G IVPB 30 MIN
3.3750 g | Freq: Once | INTRAVENOUS | Status: AC
  Administered 2016-06-29: 3.375 g via INTRAVENOUS
  Filled 2016-06-29: qty 50

## 2016-06-29 MED ORDER — CARBIDOPA-LEVODOPA 25-100 MG PO TABS
2.0000 | ORAL_TABLET | ORAL | Status: DC
  Administered 2016-06-29 – 2016-07-09 (×40): 2 via ORAL
  Filled 2016-06-29: qty 2
  Filled 2016-06-29 (×2): qty 1
  Filled 2016-06-29 (×19): qty 2
  Filled 2016-06-29: qty 1
  Filled 2016-06-29 (×5): qty 2
  Filled 2016-06-29: qty 1
  Filled 2016-06-29 (×13): qty 2

## 2016-06-29 MED ORDER — MIRABEGRON ER 50 MG PO TB24
50.0000 mg | ORAL_TABLET | Freq: Every day | ORAL | Status: DC
  Administered 2016-06-30 – 2016-07-09 (×10): 50 mg via ORAL
  Filled 2016-06-29 (×10): qty 1

## 2016-06-29 NOTE — ED Notes (Signed)
Bed: [id number] Expected date:  Expected time:  Means of arrival:  Comments: Elderly, UTI, febrile, code sepsis??

## 2016-06-29 NOTE — ED Notes (Signed)
Patient transported to X-ray 

## 2016-06-29 NOTE — ED Provider Notes (Signed)
CSN: [id number]     Arrival date & time 06/29/16  0820 History   First MD Initiated Contact with Patient 06/29/16 0831     Chief Complaint  Patient presents with  . Fever  . Urinary Frequency     Patient is a 82 y.o. male presenting with fever and frequency. The history is provided by the patient. No language interpreter was used.  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  Level 5 caveat due to confusion. He reports feeling feverish for the last week intermittently. He also reports several months of urinary incontinence. He reports increased urinary incontinence and frequency now. Tomorrow he is scheduled to have surgery to his right knee. He denies any chest pain, cough, abdominal pain, vomiting, diarrhea.  Additional hx available after wife arrived.  She states he has had subjective fever since yesterday, urinary incontinence overnight with intermittent episodes of confusion.    Past Medical History  Diagnosis Date  . Diabetes mellitus   . BPH (benign prostatic hyperplasia)   . Arthritis   . GERD (gastroesophageal reflux disease)   . Hypertension     pt denies  . Shortness of breath     uses cpap  . Peripheral vascular disease ([place]) 03-06-12    aortogram showed severe bilateral unreconstructible tibial artery disease  . Failed total knee, right ([place])   . H pylori ulcer     Gastric ulcer  . CAD (coronary artery disease)  s/p CABG     sees Dr. Loralie Champagne   . Aortic stenosis     S/p pericardial valve  . Mobitz type 1 second degree atrioventricular block   . Shingles 05-12-12  . Lumbar spinal stenosis     sees Dr. Sherwood Gambler   . Anemia     sees Dr. Julien Nordmann    Past Surgical History  Procedure Laterality Date  . Aortic valve replacement    . Foot arthrotomy Right   . Tonsillectomy    . Cataract extraction, bilateral    . Humerus fracture surgery    . Replacement total knee Bilateral    . Toe amputation      rt 2nd  . Cardiac catheterization    . Amputation  01/19/2012    Procedure: AMPUTATION DIGIT;  Surgeon: Colin Rhein, MD;  Location: [facility];  Service: Orthopedics;  Laterality: Right;  right great toe amputation through MTP joint  . Rotator cuff repair Right   . Enteroscopy N/A 10/15/2014    Procedure: ENTEROSCOPY;  Surgeon: Gatha Mayer, MD;  Location: [facility] ENDOSCOPY;  Service: Endoscopy;  Laterality: N/A;  . Colonoscopy with propofol N/A 10/17/2014    Procedure: COLONOSCOPY WITH PROPOFOL;  Surgeon: Gatha Mayer, MD;  Location: [facility] ENDOSCOPY;  Service: Endoscopy;  Laterality: N/A;  . Abdominal aortagram N/A 02/25/2012    Procedure: ABDOMINAL AORTAGRAM;  Surgeon: Elam Dutch, MD;  Location: [facility] CATH LAB;  Service: Cardiovascular;  Laterality: N/A;  . Knee closed reduction N/A 05/29/2016    Procedure: CLOSED MANIPULATION KNEE;  Surgeon: Latanya Maudlin, MD;  Location: [facility] ORS;  Service: Orthopedics;  Laterality: N/A;  . Closed reduction patellar Right 06/09/2016    Procedure: CLOSED REDUCTION PATELLAR;  Surgeon: Gaynelle Arabian, MD;  Location: [facility] ORS;  Service: Orthopedics;  Laterality: Right;   Family History  Problem Relation Age of Onset  . Diabetes type II Mother   . Diabetes type  II Father   . Colon cancer Neg Hx   . Esophageal cancer Neg Hx   . Rectal cancer Neg Hx   . Stomach cancer Neg Hx   . Heart attack Neg Hx   . Stroke Neg Hx   . Hypertension Mother   . Hypertension Father   . Diabetes Father    Social History  Substance Use Topics  . Smoking status: Never Smoker   . Smokeless tobacco: Never Used  . Alcohol Use: 4.2 oz/week    7 Glasses of wine per week     Comment: glass of wine or vodka/tonic per night    Review of Systems  Constitutional: Positive for fever.  Genitourinary: Positive for frequency.  All other systems reviewed and are negative.     Allergies  Ace inhibitors; Succinylcholine; and Morphine and  related  Home Medications   Prior to Admission medications   Medication Sig Start Date End Date Taking? Authorizing Provider  amoxicillin (AMOXIL) 500 MG capsule Take 2,000 mg by mouth as directed. Once prior to dental appointments. 06/01/16  Yes Historical Provider, MD  aspirin 81 MG tablet Take 81 mg by mouth daily.   Yes Historical Provider, MD  Calcium Carbonate-Vitamin D (CALCIUM-VITAMIN D) 500-200 MG-UNIT per tablet Take 1 tablet by mouth 2 (two) times daily.    Yes Historical Provider, MD  carbidopa-levodopa (SINEMET) 25-100 MG tablet Take 1-2 tablets by mouth See admin instructions. 2 in the AM, 1 in the afternoon, 2 in the evening before meals Patient taking differently: Take 2 tablets by mouth 4 (four) times daily.  06/09/16  Yes Gaynelle Arabian, MD  cephALEXin (KEFLEX) 500 MG capsule Take 1 capsule (500 mg total) by mouth 3 (three) times daily. Patient taking differently: Take 1,000 mg by mouth 2 (two) times daily. Continuous therapy for now. 04/05/16  Yes [name], MD  chlorhexidine (PERIDEX) 0.12 % solution Use as directed 15 mLs in the mouth or throat 2 (two) times daily as needed (sore mouth).  05/14/16  Yes Historical Provider, MD  diclofenac (VOLTAREN) 75 MG EC tablet TAKE 1 TABLET TWICE A DAY 03/25/16  Yes [name], MD  diphenoxylate-atropine (LOMOTIL) 2.5-0.025 MG tablet Take 1 tablet by mouth every 4 (four) hours as needed. diarrhea 05/30/16  Yes Historical Provider, MD  FeFum-FePoly-FA-B Cmp-C-Biot (INTEGRA PLUS) CAPS TAKE 1 CAPSULE EVERY MORNING 01/12/16  Yes Curt Bears, MD  finasteride (PROSCAR) 5 MG tablet Take 5 mg by mouth daily. 05/02/15  Yes Historical Provider, MD  HYDROcodone-acetaminophen (NORCO) 10-325 MG tablet Take 1 tablet by mouth every 6 (six) hours as needed for severe pain. 05/12/16  Yes [name], MD  LORazepam (ATIVAN) 0.5 MG tablet Take 1 tablet (0.5 mg total) by mouth every 8 (eight) hours as needed for anxiety. 03/19/16  Yes [name], MD   mirabegron ER (MYRBETRIQ) 50 MG TB24 tablet Take 50 mg by mouth daily.   Yes Historical Provider, MD  Multiple Vitamin (MULITIVITAMIN WITH MINERALS) TABS Take 1 tablet by mouth daily.   Yes Historical Provider, MD  pantoprazole (PROTONIX) 40 MG tablet Take 1 tablet (40 mg total) by mouth 2 (two) times daily. 01/29/16  Yes Amy S E[place]wood, PA-C  pioglitazone (ACTOS) 45 MG tablet TAKE 1 TABLET EVERY DAY 06/16/16  Yes [name], MD  prochlorperazine (COMPAZINE) 10 MG tablet Take 10 mg by mouth every 6 (six) hours as needed. Nausea/vomiting 05/30/16  Yes Historical Provider, MD  rosuvastatin (CRESTOR) 10 MG tablet Take  1 tablet (10 mg total) by mouth daily. 11/24/15  Yes [name], MD  sitaGLIPtin-metformin (JANUMET) 50-1000 MG tablet Take 1 tablet by mouth 2 (two) times daily with a meal. take 1 tablet by mouth twice a day WITH A MEAL. 06/10/16  Yes Gaynelle Arabian, MD  tamsulosin (FLOMAX) 0.4 MG CAPS capsule Take 0.4 mg by mouth daily after breakfast.  09/04/14  Yes Historical Provider, MD  traMADol (ULTRAM) 50 MG tablet Take 50 mg by mouth every 12 (twelve) hours as needed for moderate pain.  05/14/16  Yes Historical Provider, MD  vitamin A 10000 UNIT capsule Take 10,000 Units by mouth daily. 06/24/16  Yes Historical Provider, MD  zinc sulfate 220 (50 Zn) MG capsule Take 220 mg by mouth daily. 06/24/16  Yes Historical Provider, MD  glucose blood (ONE TOUCH TEST STRIPS) test strip Dispense one touch ultra, test TID and diagnosis code is E11.9 05/10/16   [name], MD  Lancets [facility] ULTRASOFT) lancets Test once per day and diagnosis code is E 11.9 12/03/15   [name], MD   BP 128/56 mmHg  Pulse 75  Temp(Src) 100.6 F (38.1 C) (Axillary)  Resp 24  Ht 5\' 7"  (1.702 m)  Wt 173 lb 15.1 oz (78.9 kg)  BMI 27.24 kg/m2  SpO2 98% Physical Exam  Constitutional: He appears well-developed and well-nourished.  HENT:  Head: Normocephalic and atraumatic.  Cardiovascular: Normal rate and regular  rhythm.   No murmur heard. Pulmonary/Chest: Effort normal and breath sounds normal. No respiratory distress.  Abdominal: Soft. There is no tenderness. There is no rebound and no guarding.  Musculoskeletal:  2 cm open wound to the right anterior knee with cloudy and serous drainage. There is minimal erythema around the knee and mild local tenderness. Decreased range of motion in the right knee.  Neurological: He is alert.  Disoriented to time, mildly confused, generalized weakness.  Skin: Skin is warm and dry.  Psychiatric: He has a normal mood and affect. His behavior is normal.  Nursing note and vitals reviewed.   ED Course  Procedures (including critical care time) Labs Review Labs Reviewed  URINALYSIS, ROUTINE W REFLEX MICROSCOPIC (NOT AT [facility]) - Abnormal; Notable for the following:    APPearance CLOUDY (*)    Glucose, UA 100 (*)    Protein, ur 100 (*)    All other components within normal limits  COMPREHENSIVE METABOLIC PANEL - Abnormal; Notable for the following:    Sodium 133 (*)    Glucose, Bld 225 (*)    BUN 31 (*)    Calcium 8.4 (*)    Albumin 3.2 (*)    ALT 6 (*)    All other components within normal limits  CBC WITH DIFFERENTIAL/PLATELET - Abnormal; Notable for the following:    RBC 3.30 (*)    Hemoglobin 10.5 (*)    HCT 31.4 (*)    Lymphs Abs 0.5 (*)    All other components within normal limits  URINE MICROSCOPIC-ADD ON - Abnormal; Notable for the following:    Squamous Epithelial / LPF 0-5 (*)    Bacteria, UA MANY (*)    Casts GRANULAR CAST (*)    All other components within normal limits  GLUCOSE, CAPILLARY - Abnormal; Notable for the following:    Glucose-Capillary 313 (*)    All other components within normal limits  CULTURE, BLOOD (ROUTINE X 2)  CULTURE, BLOOD (ROUTINE X 2)  AEROBIC CULTURE (SUPERFICIAL SPECIMEN)  URINE CULTURE  MRSA PCR  SCREENING  I-STAT CG4 LACTIC ACID, ED  I-STAT CG4 LACTIC ACID, ED    Imaging Review Dg Chest 2  View  06/29/2016  CLINICAL DATA:  82 year old male with a history of fever EXAM: CHEST  2 VIEW COMPARISON:  10/14/2014, 05/14/2014 FINDINGS: Cardiomediastinal silhouette unchanged with cardiomegaly. Surgical changes of prior median sternotomy and CABG, with aortic valve replacement. Low lung volumes with asymmetric elevation the right hemidiaphragm. Coarsened interstitial markings bilaterally. No confluent airspace disease. No pleural effusion or pneumothorax. Degenerative changes of the left greater than right shoulder. Surgical changes of the left humerus incompletely imaged. Calcifications of the aorta IMPRESSION: Chronic lung changes and low lung volumes without evidence of superimposed acute cardiopulmonary disease. Surgical changes of median sternotomy, CABG, aortic valve replacement. Aortic atherosclerosis. Signed, Dulcy Fanny. Earleen Newport, DO [facility] Electronically Signed   By: Corrie Mckusick D.O.   On: 06/29/2016 09:17   Dg Knee 2 Views Right  06/29/2016  CLINICAL DATA:  Fever EXAM: RIGHT KNEE - 1-2 VIEW COMPARISON:  06/09/2016 FINDINGS: No joint effusion. There is scratch set the hardware components of a right total knee arthroplasty device with long stems identified. No periprosthetic fracture or subluxation. No radio-opaque foreign bodies or soft tissue calcifications. IMPRESSION: 1. No acute findings. 2. Previous right knee arthroplasty. Electronically Signed   By: Kerby Moors M.D.   On: 06/29/2016 09:17   I have personally reviewed and evaluated these images and lab results as part of my medical decision-making.   EKG Interpretation None      MDM   Final diagnoses:  Fever, unspecified fever cause    Pt here for fever, intermittent confusion, incontinence.  UA concerning for UTI with many bacteria present.  Wound is chronic appearing with no overt cellulitis.  Treating with vancomycin and zosyn for UTI and potential deep tissue  infection.  Plan to admit to Hospitalist service for further mgmt.  Discussed with Dr. Migdalia Dk who will see the patient in consult.  Discussed case with Dr. Gladstone Lighter with Ortho who will see the patient in consult.      Quintella Reichert, MD 06/29/16 [phone]

## 2016-06-29 NOTE — Progress Notes (Signed)
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  Right knee contains hardware.  Pharmacy has been consulted for vancomycin and Zosyn dosing.  First doses of vanc 1g and zosyn 3.375g ordered for ED.  Plan: Vancomycin 1g IV q12h. Zosyn 3.375g IV q8h (4 hour infusion time).   Height: 5\' 7"  (170.2 cm) Weight: 180 lb (81.647 kg) IBW/kg (Calculated) : 66.1  Temp (24hrs), Avg:100.7 F (38.2 C), Min:100.1 F (37.8 C), Max:101.3 F (38.5 C)   Recent Labs Lab 06/29/16 0853  LATICACIDVEN 1.00    CrCl cannot be calculated (Patient has no serum creatinine result on file.).    Allergies  Allergen Reactions  . Morphine And Related Nausea And Vomiting  . Succinylcholine Other (See Comments)    unknown  . Ace Inhibitors Other (See Comments)    cough    Antimicrobials this admission: 7/11 Vancomycin >>  7/11 Zosyn >>   Dose adjustments this admission: -  Microbiology results: 7/11 BCx: sent 7/11 UCx: sent 7/11 Wound cx: sent   Thank you for allowing pharmacy to be a part of this patient's care.  Hershal Coria 06/29/2016 9:04 AM

## 2016-06-29 NOTE — ED Notes (Signed)
PT is aware of urine sample. Urinal in hand

## 2016-06-29 NOTE — H&P (Addendum)
History and Physical    [name] MD [id number] DOB: [birth year] DOA: 06/29/2016  PCP: [name], MD Patient coming from: home  Chief Complaint: fever, urinary incontinenc.   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  Patient develops fever, confusion, over weekend, then overnight he develops urinary incontinence. He has crhonic wound right knees from brace that has been draining serous  material.  Wife report patient has had frequent fall over last 4 weeks. He has been taking keflex for knee wound.   ED Course: Presents with fever, Sodium 133, lactic acid 1.0, WBC 8.2 hb 10.5, UA cloudy, 0-5 WBC, many bacterias, chest x ray with chronic lung changes, Knee x ray; no acute finding previous right knee arthroplasty   Review of Systems: As per HPI otherwise 10 point review of systems negative.     Past Medical History  Diagnosis Date  . Diabetes mellitus   . BPH (benign prostatic hyperplasia)   . Arthritis   . GERD (gastroesophageal reflux disease)   . Hypertension     pt denies  . Shortness of breath     uses cpap  . Peripheral vascular disease ([place]) 03-06-12    aortogram showed severe bilateral unreconstructible tibial artery disease  . Failed total knee, right ([place])   . H pylori ulcer     Gastric ulcer  . CAD (coronary artery disease)  s/p CABG     sees Dr. Loralie Champagne   . Aortic stenosis     S/p pericardial valve  . Mobitz type 1 second degree atrioventricular block   . Shingles 05-12-12  . Lumbar spinal stenosis     sees Dr. Sherwood Gambler   . Anemia     sees Dr. Julien Nordmann     Past Surgical History  Procedure Laterality Date  . Aortic valve replacement      . Foot arthrotomy Right   . Tonsillectomy    . Cataract extraction, bilateral    . Humerus fracture surgery    . Replacement total knee Bilateral   . Toe amputation      rt 2nd  . Cardiac catheterization    . Amputation  01/19/2012    Procedure: AMPUTATION DIGIT;  Surgeon: Colin Rhein, MD;  Location: [facility];  Service: Orthopedics;  Laterality: Right;  right great toe amputation through MTP joint  . Rotator cuff repair Right   . Enteroscopy N/A 10/15/2014    Procedure: ENTEROSCOPY;  Surgeon: Gatha Mayer, MD;  Location: [facility];  Service: Endoscopy;  Laterality: N/A;  . Colonoscopy with propofol N/A 10/17/2014    Procedure: COLONOSCOPY WITH PROPOFOL;  Surgeon: Gatha Mayer, MD;  Location: [facility];  Service: Endoscopy;  Laterality: N/A;  . Abdominal aortagram N/A 02/25/2012    Procedure: ABDOMINAL AORTAGRAM;  Surgeon: Elam Dutch, MD;  Location: [facility] CATH LAB;  Service: Cardiovascular;  Laterality: N/A;  . Knee closed reduction N/A 05/29/2016    Procedure: CLOSED MANIPULATION KNEE;  Surgeon: Latanya Maudlin, MD;  Location: [facility];  Service: Orthopedics;  Laterality: N/A;  . Closed reduction patellar Right 06/09/2016    Procedure: CLOSED REDUCTION PATELLAR;  Surgeon: Gaynelle Arabian, MD;  Location: [facility];  Service: Orthopedics;  Laterality: Right;   Social history;   reports that he has never smoked. He has never used smokeless tobacco. He reports that he drinks about 4.2 oz of alcohol per week. He reports that he does not use illicit drugs.  Allergies  Allergen Reactions  . Ace Inhibitors Other (See Comments)    cough  . Succinylcholine Other (See Comments)    Prolonged sedation  . Morphine And Related Nausea And Vomiting    Family History  Problem Relation Age of Onset  . Diabetes type II Mother   . Diabetes type II Father   . Colon cancer Neg Hx   . Esophageal cancer Neg Hx   . Rectal cancer Neg Hx   . Stomach cancer Neg Hx   . Heart attack  Neg Hx   . Stroke Neg Hx   . Hypertension Mother   . Hypertension Father   . Diabetes Father      Prior to Admission medications   Medication Sig Start Date End Date Taking? Authorizing Provider  amoxicillin (AMOXIL) 500 MG capsule Take 2,000 mg by mouth as directed. Once prior to dental appointments. 06/01/16  Yes Historical Provider, MD  aspirin 81 MG tablet Take 81 mg by mouth daily.   Yes Historical Provider, MD  Calcium Carbonate-Vitamin D (CALCIUM-VITAMIN D) 500-200 MG-UNIT per tablet Take 1 tablet by mouth 2 (two) times daily.    Yes Historical Provider, MD  carbidopa-levodopa (SINEMET) 25-100 MG tablet Take 1-2 tablets by mouth See admin instructions. 2 in the AM, 1 in the afternoon, 2 in the evening before meals Patient taking differently: Take 2 tablets by mouth 4 (four) times daily.  06/09/16  Yes Gaynelle Arabian, MD  cephALEXin (KEFLEX) 500 MG capsule Take 1 capsule (500 mg total) by mouth 3 (three) times daily. Patient taking differently: Take 1,000 mg by mouth 2 (two) times daily. Continuous therapy for now. 04/05/16  Yes [name], MD  chlorhexidine (PERIDEX) 0.12 % solution Use as directed 15 mLs in the mouth or throat 2 (two) times daily as needed (sore mouth).  05/14/16  Yes Historical Provider, MD  diclofenac (VOLTAREN) 75 MG EC tablet TAKE 1 TABLET TWICE A DAY 03/25/16  Yes [name], MD  diphenoxylate-atropine (LOMOTIL) 2.5-0.025 MG tablet Take 1 tablet by mouth every 4 (four) hours as needed. diarrhea 05/30/16  Yes Historical Provider, MD  FeFum-FePoly-FA-B Cmp-C-Biot (INTEGRA PLUS) CAPS TAKE 1 CAPSULE EVERY MORNING 01/12/16  Yes Curt Bears, MD  finasteride (PROSCAR) 5 MG tablet Take 5 mg by mouth daily. 05/02/15  Yes Historical Provider, MD  HYDROcodone-acetaminophen (NORCO) 10-325 MG tablet Take 1 tablet by mouth every 6 (six) hours as needed for severe pain. 05/12/16  Yes [name], MD  LORazepam (ATIVAN) 0.5 MG tablet Take 1 tablet (0.5 mg total) by mouth every 8  (eight) hours as needed for anxiety. 03/19/16  Yes [name], MD  mirabegron ER (MYRBETRIQ) 50 MG TB24 tablet Take 50 mg by mouth daily.   Yes Historical Provider, MD  Multiple Vitamin (MULITIVITAMIN WITH MINERALS) TABS Take 1 tablet by mouth daily.   Yes Historical Provider, MD  pantoprazole (PROTONIX) 40 MG tablet Take 1 tablet (40 mg total) by mouth 2 (two) times daily. 01/29/16  Yes Amy S Esterwood, PA-C  pioglitazone (ACTOS) 45 MG tablet TAKE 1 TABLET EVERY DAY 06/16/16  Yes [name], MD  prochlorperazine (COMPAZINE) 10 MG tablet Take 10 mg by mouth every 6 (six) hours as needed. Nausea/vomiting 05/30/16  Yes Historical Provider, MD  rosuvastatin (CRESTOR) 10 MG tablet Take 1 tablet (10 mg total) by mouth daily. 11/24/15  Yes [name], MD  sitaGLIPtin-metformin (JANUMET) 50-1000 MG tablet Take 1 tablet by mouth 2 (two) times daily with a meal. take 1 tablet by mouth twice a day WITH A MEAL. 06/10/16  Yes Gaynelle Arabian, MD  tamsulosin (FLOMAX) 0.4 MG CAPS capsule Take 0.4 mg by mouth daily after breakfast.  09/04/14  Yes Historical Provider, MD  traMADol (ULTRAM) 50 MG tablet Take 50 mg by mouth every 12 (twelve) hours as needed for moderate pain.  05/14/16  Yes Historical Provider, MD  vitamin A 10000 UNIT capsule Take 10,000 Units by mouth daily. 06/24/16  Yes Historical Provider, MD  zinc sulfate 220 (50 Zn) MG capsule Take 220 mg by mouth daily. 06/24/16  Yes Historical Provider, MD  glucose blood (ONE TOUCH TEST STRIPS) test strip Dispense one touch ultra, test TID and diagnosis code is E11.9 05/10/16   [name], MD  Lancets [facility] ULTRASOFT) lancets Test once per day and diagnosis code is E 11.9 12/03/15   [name], MD    Physical Exam: Filed Vitals:   06/29/16 0930 06/29/16 0945 06/29/16 1000 06/29/16 1006  BP: 109/53  132/54   Pulse: 67 75 73 62  Temp:      TempSrc:      Resp: 26 26 25 24   Height:      Weight:      SpO2: 94% 96% 96% 95%      Constitutional:  NAD, calm, comfortable Filed Vitals:   06/29/16 0930 06/29/16 0945 06/29/16 1000 06/29/16 1006  BP: 109/53  132/54   Pulse: 67 75 73 62  Temp:      TempSrc:      Resp: 26 26 25 24   Height:      Weight:      SpO2: 94% 96% 96% 95%   Eyes: PERRL, lids and conjunctivae normal ENMT: Mucous membranes are moist. Posterior pharynx clear of any exudate or lesions.Normal dentition.  Neck: normal, supple, no masses, no thyromegaly Respiratory: clear to auscultation bilaterally, no wheezing, no crackles. Normal respiratory effort. No accessory muscle use.  Cardiovascular: Regular rate and rhythm, no murmurs / rubs / gallops. No extremity edema. 2+ pedal pulses. No carotid bruits.  Abdomen: no tenderness, no masses palpated. No hepatosplenomegaly. Bowel sounds positive.  Musculoskeletal: no clubbing / cyanosis. No joint deformity upper and lower extremities. Good ROM, no contractures. Normal muscle tone.  Skin: open wound right knee lemon size, draining serous liquid, mild redness right knee Neurologic: CN 2-12 grossly intact. Sensation intact, DTR normal. Strength 5/5 in all 4.  Psychiatric: Normal judgment and insight. Alert and oriented x 3. Normal mood.    Labs on Admission: I have personally reviewed following labs and imaging studies  CBC:  Recent Labs Lab 06/29/16 0847  WBC 8.2  NEUTROABS 6.7  HGB 10.5*  HCT 31.4*  MCV 95.2  PLT 123456   Basic Metabolic Panel:  Recent Labs Lab 06/29/16 0847  NA 133*  K 4.0  CL 103  CO2 22  GLUCOSE 225*  BUN 31*  CREATININE 0.80  CALCIUM 8.4*   GFR: Estimated Creatinine Clearance: 65.3 mL/min (by C-G formula based on Cr of 0.8). Liver Function Tests:  Recent Labs Lab 06/29/16 0847  AST 37  ALT 6*  ALKPHOS 78  BILITOT 0.5  PROT 6.7  ALBUMIN 3.2*   No results for input(s): LIPASE, AMYLASE  in the last 168 hours. No results for input(s): AMMONIA in the last 168 hours. Coagulation Profile: No results for input(s): INR, PROTIME in  the last 168 hours. Cardiac Enzymes: No results for input(s): CKTOTAL, CKMB, CKMBINDEX, TROPONINI in the last 168 hours. BNP (last 3 results) No results for input(s): PROBNP in the last 8760 hours. HbA1C: No results for input(s): HGBA1C in the last 72 hours. CBG: No results for input(s): GLUCAP in the last 168 hours. Lipid Profile: No results for input(s): CHOL, HDL, LDLCALC, TRIG, CHOLHDL, LDLDIRECT in the last 72 hours. Thyroid Function Tests: No results for input(s): TSH, T4TOTAL, FREET4, T3FREE, THYROIDAB in the last 72 hours. Anemia Panel: No results for input(s): VITAMINB12, FOLATE, FERRITIN, TIBC, IRON, RETICCTPCT in the last 72 hours. Urine analysis:    Component Value Date/Time   COLORURINE YELLOW 06/29/2016 0907   APPEARANCEUR CLOUDY* 06/29/2016 0907   LABSPEC 1.026 06/29/2016 0907   PHURINE 5.5 06/29/2016 0907   GLUCOSEU 100* 06/29/2016 0907   HGBUR NEGATIVE 06/29/2016 0907   HGBUR negative 02/25/2010 0743   BILIRUBINUR NEGATIVE 06/29/2016 0907   BILIRUBINUR n 02/25/2016 1237   KETONESUR NEGATIVE 06/29/2016 0907   PROTEINUR 100* 06/29/2016 0907   PROTEINUR 1+ 02/25/2016 1237   UROBILINOGEN 0.2 02/25/2016 1237   UROBILINOGEN 0.2 08/20/2010 1651   NITRITE NEGATIVE 06/29/2016 0907   NITRITE n 02/25/2016 1237   LEUKOCYTESUR NEGATIVE 06/29/2016 0907   Sepsis Labs: !!!!!!!!!!!!!!!!!!!!!!!!!!!!!!!!!!!!!!!!!!!! @LABRCNTIP (procalcitonin:4,lacticidven:4) )No results found for this or any previous visit (from the past 240 hour(s)).   Radiological Exams on Admission: Dg Chest 2 View  06/29/2016  CLINICAL DATA:  82 year old male with a history of fever EXAM: CHEST  2 VIEW COMPARISON:  10/14/2014, 05/14/2014 FINDINGS: Cardiomediastinal silhouette unchanged with cardiomegaly. Surgical changes of prior median sternotomy and CABG, with aortic valve replacement. Low lung volumes with asymmetric elevation the right hemidiaphragm. Coarsened interstitial markings bilaterally. No  confluent airspace disease. No pleural effusion or pneumothorax. Degenerative changes of the left greater than right shoulder. Surgical changes of the left humerus incompletely imaged. Calcifications of the aorta IMPRESSION: Chronic lung changes and low lung volumes without evidence of superimposed acute cardiopulmonary disease. Surgical changes of median sternotomy, CABG, aortic valve replacement. Aortic atherosclerosis. Signed, Dulcy Fanny. Earleen Newport, DO [facility] Radiology Electronically Signed   By: Corrie Mckusick D.O.   On: 06/29/2016 09:17   Dg Knee 2 Views Right  06/29/2016  CLINICAL DATA:  Fever EXAM: RIGHT KNEE - 1-2 VIEW COMPARISON:  06/09/2016 FINDINGS: No joint effusion. There is scratch set the hardware components of a right total knee arthroplasty device with long stems identified. No periprosthetic fracture or subluxation. No radio-opaque foreign bodies or soft tissue calcifications. IMPRESSION: 1. No acute findings. 2. Previous right knee arthroplasty. Electronically Signed   By: Kerby Moors M.D.   On: 06/29/2016 09:17    EKG: none available.   Assessment/Plan Active Problems:   UTI (urinary tract infection)  1-Fevers; multifactorial UTI, Vs wound infection.  Follow blood cultures.   2-Right knee wound, infection.  Presents with increase serous drainage serous, bad odor.  IV vacomycin and Zosyn.  Dr Cay Schillings, [place] consulted.   3-Presume UTI. Presents with fever, urinary incontinence, UA with no significant WBC. Many bacteria.  Follow urine culture.  On IV zosyn.   4-Parkinson Diseases; Continue with carbidopa-levodopa. He is also on ativan PRN for  Tremors.   5-Anemia, iron deficiency;  Follow hb trend.  Continue with iron supplementation.   6-DM; Hold  oral hypoglycemic agents.  SSI.  7-CAD; continue with aspirin.  8-Frequent fall; needs PT evaluation.   DVT prophylaxis: Lovenox Code Status: Wishes to be full code..    Family Communication: wife at bedside.  Disposition Plan: to be determine.  Consults called: ED consulted Dr Sherral Hammers and Dr Migdalia Dk.  Admission status: inpatient, telemetry    Niel Hummer A MD [facility] Pager [phone]  If 7PM-7AM, please contact night-coverage [email or web address] Password [email or web address]  06/29/2016, 11:15 AM

## 2016-06-29 NOTE — Progress Notes (Signed)
Inpatient Diabetes Program Recommendations  AACE/ADA: New Consensus Statement on Inpatient Glycemic Control (2015)  Target Ranges:  Prepandial:   less than 140 mg/dL      Peak postprandial:   less than 180 mg/dL (1-2 hours)      Critically ill patients:  140 - 180 mg/dL   Lab Results  Component Value Date   GLUCAP 236* 06/29/2016   HGBA1C 7.6* 02/25/2016  Results for STAFFORD MD, Lorinda Creed (MRN [id number]) as of 06/29/2016 17:53  Ref. Range 06/29/2016 12:38 06/29/2016 17:35  Glucose-Capillary Latest Ref Range: 65-99 mg/dL 313 (H) 236 (H)    Review of Glycemic Control  Diabetes history: DM2 Outpatient Diabetes medications: Actos 45 mg QD, Janumet 50/1000 mg bid Current orders for Inpatient glycemic control: Novolog sensitive tidwc  Inpatient Diabetes Program Recommendations:    Need updated HgbA1C to assess glycemic control prior to discharge. Consider low-dose basal insulin - Lantus 10 units QHS Consider Novolog 3 units tidwc for meal coverage insulin. Add HS correction.  Will continue to follow. Thank you. Lorenda Peck, RD, LDN, CDE Inpatient Diabetes Coordinator [phone]

## 2016-06-29 NOTE — ED Notes (Signed)
MD at bedside. 

## 2016-06-29 NOTE — ED Notes (Addendum)
Per EMS, patient called out due to a fever & urinary frequency. Patient reports 1 episode of incontinence last night Patient scheduled to have surgery on leision on right knee at [facility] tomorrow. Denies N/V/D. Patient is from home.

## 2016-06-29 NOTE — Consult Note (Signed)
Reason for Consult:Wound infection right Knee. 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  HPI: He has had a history of a Dislocation of his Right Knee twice in the past 6-weeks and has had a chronic skin breakdown over his right Knee from his brace. Today he presented to the ER with a possible UTI and some drainage from his knee wound.  Past Medical History  Diagnosis Date  . Diabetes mellitus   . BPH (benign prostatic hyperplasia)   . Arthritis   . GERD (gastroesophageal reflux disease)   . Hypertension     pt denies  . Shortness of breath     uses cpap  . Peripheral vascular disease ([place]) 03-06-12    aortogram showed severe bilateral unreconstructible tibial artery disease  . Failed total knee, right ([place])   . H pylori ulcer     Gastric ulcer  . CAD (coronary artery disease)  s/p CABG     sees Dr. Loralie Champagne   . Aortic stenosis     S/p pericardial valve  . Mobitz type 1 second degree atrioventricular block   . Shingles 05-12-12  . Lumbar spinal stenosis     sees Dr. Sherwood Gambler   . Anemia     sees Dr. Julien Nordmann     Past Surgical History  Procedure Laterality Date  . Aortic valve replacement    . Foot arthrotomy Right   . Tonsillectomy    . Cataract extraction, bilateral    . Humerus fracture surgery    . Replacement total knee Bilateral   . Toe amputation      rt 2nd  . Cardiac catheterization    . Amputation  01/19/2012    Procedure: AMPUTATION DIGIT;  Surgeon: Colin Rhein, MD;  Location: [place];  Service: Orthopedics;  Laterality: Right;  right great toe amputation through MTP joint  . Rotator cuff repair Right   . Enteroscopy N/A 10/15/2014    Procedure: ENTEROSCOPY;  Surgeon: Gatha Mayer, MD;  Location: [facility];  Service: Endoscopy;  Laterality: N/A;  . Colonoscopy with propofol N/A 10/17/2014    Procedure: COLONOSCOPY WITH PROPOFOL;  Surgeon: Gatha Mayer, MD;  Location: [facility];  Service:  Endoscopy;  Laterality: N/A;  . Abdominal aortagram N/A 02/25/2012    Procedure: ABDOMINAL AORTAGRAM;  Surgeon: Elam Dutch, MD;  Location: [facility] CATH LAB;  Service: Cardiovascular;  Laterality: N/A;  . Knee closed reduction N/A 05/29/2016    Procedure: CLOSED MANIPULATION KNEE;  Surgeon: Latanya Maudlin, MD;  Location: [facility];  Service: Orthopedics;  Laterality: N/A;  . Closed reduction patellar Right 06/09/2016    Procedure: CLOSED REDUCTION PATELLAR;  Surgeon: Gaynelle Arabian, MD;  Location: [facility];  Service: Orthopedics;  Laterality: Right;    Family History  Problem Relation Age of Onset  . Diabetes type II Mother   . Diabetes type II Father   . Colon cancer Neg Hx   . Esophageal cancer Neg Hx   . Rectal cancer Neg Hx   . Stomach cancer Neg Hx   . Heart attack Neg Hx   . Stroke Neg Hx   . Hypertension Mother   . Hypertension Father   . Diabetes Father     Social History:  reports that he has never smoked. He has never used smokeless tobacco. He reports that he drinks about 4.2 oz of alcohol per week. He reports that he does not use illicit drugs.  Allergies:  Allergies  Allergen Reactions  . Ace Inhibitors Other (See Comments)    cough  . Succinylcholine Other (See Comments)    Prolonged sedation  . Morphine And Related Nausea And Vomiting    Medications: I have reviewed the patient's current medications.  Results for orders placed or performed during the hospital encounter of 06/29/16 (from the past 48 hour(s))  Culture, blood (Routine X 2)     Status: None (Preliminary result)   Collection Time: 06/29/16  8:45 AM  Result Value Ref Range   Specimen Description BLOOD RIGHT ANTECUBITAL    Special Requests BOTTLES DRAWN AEROBIC AND ANAEROBIC 5ML    Culture      NO GROWTH < 12 HOURS Performed at [facility]    Report Status PENDING   Culture, blood (Routine X 2)     Status: None (Preliminary result)   Collection Time: 06/29/16  8:47 AM  Result Value Ref Range    Specimen Description BLOOD LEFT HAND    Special Requests BOTTLES DRAWN AEROBIC AND ANAEROBIC 5CC    Culture      NO GROWTH < 12 HOURS Performed at [facility]    Report Status PENDING   Comprehensive metabolic panel     Status: Abnormal   Collection Time: 06/29/16  8:47 AM  Result Value Ref Range   Sodium 133 (L) 135 - 145 mmol/L   Potassium 4.0 3.5 - 5.1 mmol/L   Chloride 103 101 - 111 mmol/L   CO2 22 22 - 32 mmol/L   Glucose, Bld 225 (H) 65 - 99 mg/dL   BUN 31 (H) 6 - 20 mg/dL   Creatinine, Ser 0.80 0.61 - 1.24 mg/dL   Calcium 8.4 (L) 8.9 - 10.3 mg/dL   Total Protein 6.7 6.5 - 8.1 g/dL   Albumin 3.2 (L) 3.5 - 5.0 g/dL   AST 37 15 - 41 U/L   ALT 6 (L) 17 - 63 U/L   Alkaline Phosphatase 78 38 - 126 U/L   Total Bilirubin 0.5 0.3 - 1.2 mg/dL   GFR calc non Af Amer >60 >60 mL/min   GFR calc Af Amer >60 >60 mL/min    Comment: (NOTE) The eGFR has been calculated using the CKD EPI equation. This calculation has not been validated in all clinical situations. eGFR's persistently <60 mL/min signify possible Chronic Kidney Disease.    Anion gap 8 5 - 15  CBC WITH DIFFERENTIAL     Status: Abnormal   Collection Time: 06/29/16  8:47 AM  Result Value Ref Range   WBC 8.2 4.0 - 10.5 K/uL   RBC 3.30 (L) 4.22 - 5.81 MIL/uL   Hemoglobin 10.5 (L) 13.0 - 17.0 g/dL   HCT 31.4 (L) 39.0 - 52.0 %   MCV 95.2 78.0 - 100.0 fL   MCH 31.8 26.0 - 34.0 pg   MCHC 33.4 30.0 - 36.0 g/dL   RDW 14.4 11.5 - 15.5 %   Platelets 164 150 - 400 K/uL   Neutrophils Relative % 82 %   Lymphocytes Relative 6 %   Monocytes Relative 12 %   Eosinophils Relative 0 %   Basophils Relative 0 %   Neutro Abs 6.7 1.7 - 7.7 K/uL   Lymphs Abs 0.5 (L) 0.7 - 4.0 K/uL   Monocytes Absolute 1.0 0.1 - 1.0 K/uL   Eosinophils Absolute 0.0 0.0 - 0.7 K/uL   Basophils Absolute 0.0 0.0 - 0.1 K/uL   WBC Morphology DOHLE BODIES     Comment:  INCREASED BANDS (>20% BANDS)  Wound or Superficial Culture     Status: None  (Preliminary result)   Collection Time: 06/29/16  8:47 AM  Result Value Ref Range   Specimen Description WOUND RIGHT KNEE    Special Requests Normal    Gram Stain      FEW WBC PRESENT, PREDOMINANTLY PMN FEW GRAM POSITIVE COCCI IN PAIRS Performed at [facility]    Culture PENDING    Report Status PENDING   I-Stat CG4 Lactic Acid, ED  (not at  [facility])     Status: None   Collection Time: 06/29/16  8:53 AM  Result Value Ref Range   Lactic Acid, Venous 1.00 0.5 - 1.9 mmol/L  Urinalysis, Routine w reflex microscopic- may I&O cath if menses     Status: Abnormal   Collection Time: 06/29/16  9:07 AM  Result Value Ref Range   Color, Urine YELLOW YELLOW   APPearance CLOUDY (A) CLEAR   Specific Gravity, Urine 1.026 1.005 - 1.030   pH 5.5 5.0 - 8.0   Glucose, UA 100 (A) NEGATIVE mg/dL   Hgb urine dipstick NEGATIVE NEGATIVE   Bilirubin Urine NEGATIVE NEGATIVE   Ketones, ur NEGATIVE NEGATIVE mg/dL   Protein, ur 100 (A) NEGATIVE mg/dL   Nitrite NEGATIVE NEGATIVE   Leukocytes, UA NEGATIVE NEGATIVE  Urine microscopic-add on     Status: Abnormal   Collection Time: 06/29/16  9:07 AM  Result Value Ref Range   Squamous Epithelial / LPF 0-5 (A) NONE SEEN   WBC, UA 0-5 0 - 5 WBC/hpf   RBC / HPF NONE SEEN 0 - 5 RBC/hpf   Bacteria, UA MANY (A) NONE SEEN   Casts GRANULAR CAST (A) NEGATIVE  Glucose, capillary     Status: Abnormal   Collection Time: 06/29/16 12:38 PM  Result Value Ref Range   Glucose-Capillary 313 (H) 65 - 99 mg/dL    Dg Chest 2 View  06/29/2016  CLINICAL DATA:  82 year old male with a history of fever EXAM: CHEST  2 VIEW COMPARISON:  10/14/2014, 05/14/2014 FINDINGS: Cardiomediastinal silhouette unchanged with cardiomegaly. Surgical changes of prior median sternotomy and CABG, with aortic valve replacement. Low lung volumes with asymmetric elevation the right hemidiaphragm. Coarsened interstitial markings bilaterally. No confluent airspace disease. No pleural effusion or  pneumothorax. Degenerative changes of the left greater than right shoulder. Surgical changes of the left humerus incompletely imaged. Calcifications of the aorta IMPRESSION: Chronic lung changes and low lung volumes without evidence of superimposed acute cardiopulmonary disease. Surgical changes of median sternotomy, CABG, aortic valve replacement. Aortic atherosclerosis. Signed, Dulcy Fanny. Earleen Newport, DO [facility] Electronically Signed   By: Corrie Mckusick D.O.   On: 06/29/2016 09:17   Dg Knee 2 Views Right  06/29/2016  CLINICAL DATA:  Fever EXAM: RIGHT KNEE - 1-2 VIEW COMPARISON:  06/09/2016 FINDINGS: No joint effusion. There is scratch set the hardware components of a right total knee arthroplasty device with long stems identified. No periprosthetic fracture or subluxation. No radio-opaque foreign bodies or soft tissue calcifications. IMPRESSION: 1. No acute findings. 2. Previous right knee arthroplasty. Electronically Signed   By: Kerby Moors M.D.   On: 06/29/2016 09:17    Review of Systems  Constitutional: Positive for fever.  HENT: Negative.   Eyes: Negative.   Respiratory: Negative.   Cardiovascular: Negative.   Gastrointestinal: Negative.   Genitourinary: Positive for frequency.  Musculoskeletal: Positive for joint pain.  Skin:  Ulceration of front of right Knee  Neurological: Negative.   Endo/Heme/Allergies: Negative.   Psychiatric/Behavioral: Negative.    Blood pressure 128/56, pulse 75, temperature 100.6 F (38.1 C), temperature source Axillary, resp. rate 24, height 5' 7"  (1.702 m), weight 78.9 kg (173 lb 15.1 oz), SpO2 98 %. Physical Exam  Constitutional: He appears distressed.  HENT:  Head: Normocephalic.  Eyes: Pupils are equal, round, and reactive to light.  Neck: Normal range of motion.  Cardiovascular: Normal rate.   Respiratory: Effort normal.  GI: Soft.  Musculoskeletal:  Right Total Knee with  ulceration of Skin anteriorlaspect of right Knee. No active drainage.  Neurological: He is alert.  Skin:  Skin ulceration of Right anterior Knee.  Psychiatric: He has a normal mood and affect.    Assessment/Plan: Wound inspection and application of a Betadine dressing. He will also be seen by DR. Sanger.Knee Xrays are fine.Plan on evaluation for UTI and wound care. Culture taken in ER.  Candy Leverett A 06/29/2016, 3:12 PM

## 2016-06-30 ENCOUNTER — Ambulatory Visit: Payer: Medicare Other | Admitting: Cardiovascular Disease

## 2016-06-30 ENCOUNTER — Encounter (HOSPITAL_COMMUNITY): Admission: RE | Payer: Self-pay | Source: Ambulatory Visit

## 2016-06-30 ENCOUNTER — Ambulatory Visit (HOSPITAL_COMMUNITY): Admission: RE | Admit: 2016-06-30 | Payer: Medicare Other | Source: Ambulatory Visit | Admitting: Plastic Surgery

## 2016-06-30 DIAGNOSIS — E038 Other specified hypothyroidism: Secondary | ICD-10-CM

## 2016-06-30 DIAGNOSIS — L8990 Pressure ulcer of unspecified site, unspecified stage: Secondary | ICD-10-CM | POA: Diagnosis present

## 2016-06-30 DIAGNOSIS — I25810 Atherosclerosis of coronary artery bypass graft(s) without angina pectoris: Secondary | ICD-10-CM

## 2016-06-30 DIAGNOSIS — I5022 Chronic systolic (congestive) heart failure: Secondary | ICD-10-CM

## 2016-06-30 DIAGNOSIS — A419 Sepsis, unspecified organism: Secondary | ICD-10-CM

## 2016-06-30 LAB — BASIC METABOLIC PANEL
ANION GAP: 9 (ref 5–15)
BUN: 24 mg/dL — AB (ref 6–20)
CHLORIDE: 103 mmol/L (ref 101–111)
CO2: 21 mmol/L — ABNORMAL LOW (ref 22–32)
Calcium: 8.3 mg/dL — ABNORMAL LOW (ref 8.9–10.3)
Creatinine, Ser: 0.74 mg/dL (ref 0.61–1.24)
GFR calc Af Amer: >60 mL/min (ref >60)
GLUCOSE: 243 mg/dL — AB (ref 65–99)
POTASSIUM: 4.1 mmol/L (ref 3.5–5.1)
Sodium: 133 mmol/L — ABNORMAL LOW (ref 135–145)

## 2016-06-30 LAB — BLOOD CULTURE ID PANEL (REFLEXED)
Acinetobacter baumannii: NOT DETECTED
CANDIDA TROPICALIS: NOT DETECTED
Candida albicans: NOT DETECTED
Candida glabrata: NOT DETECTED
Candida krusei: NOT DETECTED
Candida parapsilosis: NOT DETECTED
Carbapenem resistance: NOT DETECTED
ENTEROBACTER CLOACAE COMPLEX: NOT DETECTED
ENTEROCOCCUS SPECIES: NOT DETECTED
Enterobacteriaceae species: NOT DETECTED
Escherichia coli: NOT DETECTED
HAEMOPHILUS INFLUENZAE: NOT DETECTED
Klebsiella oxytoca: NOT DETECTED
Klebsiella pneumoniae: NOT DETECTED
LISTERIA MONOCYTOGENES: NOT DETECTED
METHICILLIN RESISTANCE: NOT DETECTED
Neisseria meningitidis: NOT DETECTED
PROTEUS SPECIES: NOT DETECTED
Pseudomonas aeruginosa: NOT DETECTED
SERRATIA MARCESCENS: NOT DETECTED
STAPHYLOCOCCUS AUREUS BCID: NOT DETECTED
STAPHYLOCOCCUS SPECIES: NOT DETECTED
STREPTOCOCCUS PYOGENES: NOT DETECTED
Streptococcus agalactiae: NOT DETECTED
Streptococcus pneumoniae: NOT DETECTED
Streptococcus species: NOT DETECTED
VANCOMYCIN RESISTANCE: NOT DETECTED

## 2016-06-30 LAB — CBC
HEMATOCRIT: 29.2 % — AB (ref 39.0–52.0)
HEMOGLOBIN: 9.8 g/dL — AB (ref 13.0–17.0)
MCH: 32 pg (ref 26.0–34.0)
MCHC: 33.6 g/dL (ref 30.0–36.0)
MCV: 95.4 fL (ref 78.0–100.0)
Platelets: 177 10*3/uL (ref 150–400)
RBC: 3.06 MIL/uL — ABNORMAL LOW (ref 4.22–5.81)
RDW: 14.6 % (ref 11.5–15.5)
WBC: 10.2 10*3/uL (ref 4.0–10.5)

## 2016-06-30 LAB — GLUCOSE, CAPILLARY
GLUCOSE-CAPILLARY: 277 mg/dL — AB (ref 65–99)
GLUCOSE-CAPILLARY: 290 mg/dL — AB (ref 65–99)
Glucose-Capillary: 231 mg/dL — ABNORMAL HIGH (ref 65–99)
Glucose-Capillary: 330 mg/dL — ABNORMAL HIGH (ref 65–99)

## 2016-06-30 SURGERY — IRRIGATION AND DEBRIDEMENT WOUND
Anesthesia: General | Laterality: Right

## 2016-06-30 MED ORDER — ALUM & MAG HYDROXIDE-SIMETH 200-200-20 MG/5ML PO SUSP
30.0000 mL | ORAL | Status: DC | PRN
  Administered 2016-06-30 – 2016-07-03 (×2): 30 mL via ORAL
  Filled 2016-06-30 (×2): qty 30

## 2016-06-30 MED ORDER — ZOLPIDEM TARTRATE 5 MG PO TABS
5.0000 mg | ORAL_TABLET | Freq: Once | ORAL | Status: AC
  Administered 2016-06-30: 5 mg via ORAL
  Filled 2016-06-30: qty 1

## 2016-06-30 MED ORDER — ENSURE ENLIVE PO LIQD: 237.0000 mL | Freq: Three times a day (TID) | ORAL | Status: DC

## 2016-06-30 MED ORDER — GLUCERNA 1.2 CAL PO LIQD
237.0000 mL | Freq: Three times a day (TID) | ORAL | Status: DC
  Administered 2016-06-30 – 2016-07-01 (×3): 237 mL via ORAL
  Filled 2016-06-30 (×5): qty 237

## 2016-06-30 MED ORDER — SACCHAROMYCES BOULARDII 250 MG PO CAPS
250.0000 mg | ORAL_CAPSULE | Freq: Two times a day (BID) | ORAL | Status: DC
  Administered 2016-06-30 – 2016-07-06 (×13): 250 mg via ORAL
  Filled 2016-06-30 (×13): qty 1

## 2016-06-30 MED ORDER — PIOGLITAZONE HCL 45 MG PO TABS
45.0000 mg | ORAL_TABLET | Freq: Every day | ORAL | Status: DC
  Administered 2016-06-30 – 2016-07-09 (×10): 45 mg via ORAL
  Filled 2016-06-30 (×10): qty 1

## 2016-06-30 NOTE — Consult Note (Addendum)
WOC wound consult note Consult received for wound assessment and suggestions for care by Dr. Tyrell Antonio; Dr. Gladstone Lighter consulted and his note indicates that Plastics (Dr. Guadlupe Spanish) has been consulted by Dr. Ralene Bathe and that she (Dr. Marla Roe) will see/manage care. Please contact her service for continuing wound care orders.  [place] Nurse will not see at this time. [place] nursing team will not follow, but will remain available to this patient, the nursing and medical teams.  Please re-consult if needed. Thanks, Maudie Flakes, MSN, RN, [place], Arther Abbott  Pager# [phone]

## 2016-06-30 NOTE — Consult Note (Signed)
Reason for Consult: Right knee wound, S/P TKR Referring Physician: Dr. Wynelle Cleveland, IM service [facility] Inpatient consult 06/30/16  Stanley [name] is an 82 y.o. male.  HPI:  Stanley [name] is a 82 y.o. male with medical history significant of DM, PVD, CAD, S/P CABG, aortic stenosis, S/P AVR,chronic systolic CHF and Parkinson's disease who was admitted with fevers, mild confusion and urinary incontinence and increased drainage from his right knee wound. He remotely had a R TKR and had recent dislocation of the R TKR requiring closed manipulation on 05/29/16 and then closed reduction of the patella on 06/09/16. He had developed a small wound over the anterior right knee felt to be related to his bracing. Dr. Marla Roe saw the patient and felt he would benefit from debridement in the OR and placement of Acell to the knee wound and the surgery was scheduled for today, but the patient was admitted in the interim with the above problems.  He was on Keflex for coverage prior to admission.   Work up has shown GPC bacteremia and 4K colonies GNR on UCx.  Lactic acid was 1.00. WBC 8.2,  Hgb 10.5, Glucose 225, Albumin 3.2.  He is currently on Zosyn and Vancomycin. Plain radiographs do not show any peri-prosthetic fracture or lucency. The patient reports the knee is somewhat redder than it has been and is more painful today with moving. He reports that a 2D Echo is also planned and ID consultation.     Past Medical History  Diagnosis Date  . Diabetes mellitus   . BPH (benign prostatic hyperplasia)   . Arthritis   . GERD (gastroesophageal reflux disease)   . Hypertension     pt denies  . Shortness of breath     uses cpap  . Peripheral vascular disease ([place]) 03-06-12    aortogram showed severe bilateral unreconstructible tibial artery disease  . Failed total knee, right ([place])   . H pylori ulcer     Gastric ulcer  . CAD (coronary artery disease)  s/p CABG     sees Dr. Loralie Champagne   .  Aortic stenosis     S/p pericardial valve  . Mobitz type 1 second degree atrioventricular block   . Shingles 05-12-12  . Lumbar spinal stenosis     sees Dr. Sherwood Gambler   . Anemia     sees Dr. Julien Nordmann     Past Surgical History  Procedure Laterality Date  . Aortic valve replacement    . Foot arthrotomy Right   . Tonsillectomy    . Cataract extraction, bilateral    . Humerus fracture surgery    . Replacement total knee Bilateral   . Toe amputation      rt 2nd  . Cardiac catheterization    . Amputation  01/19/2012    Procedure: AMPUTATION DIGIT;  Surgeon: Colin Rhein, [name];  Location: [place];  Service: Orthopedics;  Laterality: Right;  right great toe amputation through MTP joint  . Rotator cuff repair Right   . Enteroscopy N/A 10/15/2014    Procedure: ENTEROSCOPY;  Surgeon: Gatha Mayer, [name];  Location: [facility] ENDOSCOPY;  Service: Endoscopy;  Laterality: N/A;  . Colonoscopy with propofol N/A 10/17/2014    Procedure: COLONOSCOPY WITH PROPOFOL;  Surgeon: Gatha Mayer, [name];  Location: [facility] ENDOSCOPY;  Service: Endoscopy;  Laterality: N/A;  . Abdominal aortagram N/A 02/25/2012    Procedure: ABDOMINAL AORTAGRAM;  Surgeon: Elam Dutch, [name];  Location: [facility] CATH  LAB;  Service: Cardiovascular;  Laterality: N/A;  . Knee closed reduction N/A 05/29/2016    Procedure: CLOSED MANIPULATION KNEE;  Surgeon: Latanya Maudlin, [name];  Location: [facility] ORS;  Service: Orthopedics;  Laterality: N/A;  . Closed reduction patellar Right 06/09/2016    Procedure: CLOSED REDUCTION PATELLAR;  Surgeon: Gaynelle Arabian, [name];  Location: [facility] ORS;  Service: Orthopedics;  Laterality: Right;    Family History  Problem Relation Age of Onset  . Diabetes type II Mother   . Diabetes type II Father   . Colon cancer Neg Hx   . Esophageal cancer Neg Hx   . Rectal cancer Neg Hx   . Stomach cancer Neg Hx   . Heart attack Neg Hx   . Stroke Neg Hx   . Hypertension Mother   . Hypertension Father   . Diabetes Father      Social History:  reports that he has never smoked. He has never used smokeless tobacco. He reports that he drinks about 4.2 oz of alcohol per week. He reports that he does not use illicit drugs.  Allergies:  Allergies  Allergen Reactions  . Ace Inhibitors Other (See Comments)    cough  . Succinylcholine Other (See Comments)    Prolonged sedation  . Morphine And Related Nausea And Vomiting    Medications: I have reviewed the patient's current medications.  Results for orders placed or performed during the hospital encounter of 06/29/16 (from the past 48 hour(s))  Culture, blood (Routine X 2)     Status: None (Preliminary result)   Collection Time: 06/29/16  8:45 AM  Result Value Ref Range   Specimen Description BLOOD RIGHT ANTECUBITAL    Special Requests BOTTLES DRAWN AEROBIC AND ANAEROBIC 5ML    Culture  Setup Time      GRAM POSITIVE COCCI IN CLUSTERS IN BOTH AEROBIC AND ANAEROBIC BOTTLES Organism ID to follow CRITICAL RESULT CALLED TO, READ BACK BY AND VERIFIED WITH: MLT T.MULLINS [id number] @[id number]  MLM CRITICAL RESULT CALLED TO, READ BACK BY AND VERIFIED WITH: SEAY,K. RN AT 4431 06/30/16 MULLINS,T    Culture GRAM POSITIVE COCCI    Report Status PENDING   Blood Culture ID Panel (Reflexed)     Status: None   Collection Time: 06/29/16  8:45 AM  Result Value Ref Range   Enterococcus species NOT DETECTED NOT DETECTED   Vancomycin resistance NOT DETECTED NOT DETECTED   Listeria monocytogenes NOT DETECTED NOT DETECTED   Staphylococcus species NOT DETECTED NOT DETECTED   Staphylococcus aureus NOT DETECTED NOT DETECTED   Methicillin resistance NOT DETECTED NOT DETECTED   Streptococcus species NOT DETECTED NOT DETECTED   Streptococcus agalactiae NOT DETECTED NOT DETECTED   Streptococcus pneumoniae NOT DETECTED NOT DETECTED   Streptococcus pyogenes NOT DETECTED NOT DETECTED   Acinetobacter baumannii NOT DETECTED NOT DETECTED   Enterobacteriaceae species NOT DETECTED NOT DETECTED    Enterobacter cloacae complex NOT DETECTED NOT DETECTED   Escherichia coli NOT DETECTED NOT DETECTED   Klebsiella oxytoca NOT DETECTED NOT DETECTED   Klebsiella pneumoniae NOT DETECTED NOT DETECTED   Proteus species NOT DETECTED NOT DETECTED   Serratia marcescens NOT DETECTED NOT DETECTED   Carbapenem resistance NOT DETECTED NOT DETECTED   Haemophilus influenzae NOT DETECTED NOT DETECTED   Neisseria meningitidis NOT DETECTED NOT DETECTED   Pseudomonas aeruginosa NOT DETECTED NOT DETECTED   Candida albicans NOT DETECTED NOT DETECTED   Candida glabrata NOT DETECTED NOT DETECTED   Candida krusei NOT DETECTED NOT DETECTED   Candida parapsilosis  NOT DETECTED NOT DETECTED   Candida tropicalis NOT DETECTED NOT DETECTED    Comment: Performed at [facility]  Culture, blood (Routine X 2)     Status: None (Preliminary result)   Collection Time: 06/29/16  8:47 AM  Result Value Ref Range   Specimen Description BLOOD LEFT HAND    Special Requests BOTTLES DRAWN AEROBIC AND ANAEROBIC 5CC    Culture  Setup Time      GRAM POSITIVE COCCI IN CLUSTERS IN BOTH AEROBIC AND ANAEROBIC BOTTLES CRITICAL VALUE NOTED.  VALUE IS CONSISTENT WITH PREVIOUSLY REPORTED AND CALLED VALUE.    Culture      NO GROWTH 1 DAY Performed at [facility]    Report Status PENDING   Comprehensive metabolic panel     Status: Abnormal   Collection Time: 06/29/16  8:47 AM  Result Value Ref Range   Sodium 133 (L) 135 - 145 mmol/L   Potassium 4.0 3.5 - 5.1 mmol/L   Chloride 103 101 - 111 mmol/L   CO2 22 22 - 32 mmol/L   Glucose, Bld 225 (H) 65 - 99 mg/dL   BUN 31 (H) 6 - 20 mg/dL   Creatinine, Ser 0.80 0.61 - 1.24 mg/dL   Calcium 8.4 (L) 8.9 - 10.3 mg/dL   Total Protein 6.7 6.5 - 8.1 g/dL   Albumin 3.2 (L) 3.5 - 5.0 g/dL   AST 37 15 - 41 U/L   ALT 6 (L) 17 - 63 U/L   Alkaline Phosphatase 78 38 - 126 U/L   Total Bilirubin 0.5 0.3 - 1.2 mg/dL   GFR calc non Af Amer >60 >60 mL/min   GFR calc Af Amer >60 >60  mL/min    Comment: (NOTE) The eGFR has been calculated using the CKD EPI equation. This calculation has not been validated in all clinical situations. eGFR's persistently <60 mL/min signify possible Chronic Kidney Disease.    Anion gap 8 5 - 15  CBC WITH DIFFERENTIAL     Status: Abnormal   Collection Time: 06/29/16  8:47 AM  Result Value Ref Range   WBC 8.2 4.0 - 10.5 K/uL   RBC 3.30 (L) 4.22 - 5.81 MIL/uL   Hemoglobin 10.5 (L) 13.0 - 17.0 g/dL   HCT 31.4 (L) 39.0 - 52.0 %   MCV 95.2 78.0 - 100.0 fL   MCH 31.8 26.0 - 34.0 pg   MCHC 33.4 30.0 - 36.0 g/dL   RDW 14.4 11.5 - 15.5 %   Platelets 164 150 - 400 K/uL   Neutrophils Relative % 82 %   Lymphocytes Relative 6 %   Monocytes Relative 12 %   Eosinophils Relative 0 %   Basophils Relative 0 %   Neutro Abs 6.7 1.7 - 7.7 K/uL   Lymphs Abs 0.5 (L) 0.7 - 4.0 K/uL   Monocytes Absolute 1.0 0.1 - 1.0 K/uL   Eosinophils Absolute 0.0 0.0 - 0.7 K/uL   Basophils Absolute 0.0 0.0 - 0.1 K/uL   WBC Morphology DOHLE BODIES     Comment: INCREASED BANDS (>20% BANDS)  Wound or Superficial Culture     Status: None (Preliminary result)   Collection Time: 06/29/16  8:47 AM  Result Value Ref Range   Specimen Description WOUND RIGHT KNEE    Special Requests Normal    Gram Stain      FEW WBC PRESENT, PREDOMINANTLY PMN FEW GRAM POSITIVE COCCI IN PAIRS    Culture      CULTURE REINCUBATED FOR BETTER GROWTH  Performed at [facility]    Report Status PENDING   I-Stat CG4 Lactic Acid, ED  (not at  [facility])     Status: None   Collection Time: 06/29/16  8:53 AM  Result Value Ref Range   Lactic Acid, Venous 1.00 0.5 - 1.9 mmol/L  Urinalysis, Routine w reflex microscopic- may I&O cath if menses     Status: Abnormal   Collection Time: 06/29/16  9:07 AM  Result Value Ref Range   Color, Urine YELLOW YELLOW   APPearance CLOUDY (A) CLEAR   Specific Gravity, Urine 1.026 1.005 - 1.030   pH 5.5 5.0 - 8.0   Glucose, UA 100 (A) NEGATIVE mg/dL   Hgb  urine dipstick NEGATIVE NEGATIVE   Bilirubin Urine NEGATIVE NEGATIVE   Ketones, ur NEGATIVE NEGATIVE mg/dL   Protein, ur 100 (A) NEGATIVE mg/dL   Nitrite NEGATIVE NEGATIVE   Leukocytes, UA NEGATIVE NEGATIVE  Urine C&S     Status: Abnormal (Preliminary result)   Collection Time: 06/29/16  9:07 AM  Result Value Ref Range   Specimen Description URINE, CLEAN CATCH    Special Requests Normal    Culture 40,000 COLONIES/mL GRAM NEGATIVE RODS (A)    Report Status PENDING   Urine microscopic-add on     Status: Abnormal   Collection Time: 06/29/16  9:07 AM  Result Value Ref Range   Squamous Epithelial / LPF 0-5 (A) NONE SEEN   WBC, UA 0-5 0 - 5 WBC/hpf   RBC / HPF NONE SEEN 0 - 5 RBC/hpf   Bacteria, UA MANY (A) NONE SEEN   Casts GRANULAR CAST (A) NEGATIVE  Glucose, capillary     Status: Abnormal   Collection Time: 06/29/16 12:38 PM  Result Value Ref Range   Glucose-Capillary 313 (H) 65 - 99 mg/dL  MRSA PCR Screening     Status: None   Collection Time: 06/29/16  1:27 PM  Result Value Ref Range   MRSA by PCR NEGATIVE NEGATIVE    Comment:        The GeneXpert MRSA Assay (FDA approved for NASAL specimens only), is one component of a comprehensive MRSA colonization surveillance program. It is not intended to diagnose MRSA infection nor to guide or monitor treatment for MRSA infections.   Glucose, capillary     Status: Abnormal   Collection Time: 06/29/16  5:35 PM  Result Value Ref Range   Glucose-Capillary 236 (H) 65 - 99 mg/dL  Glucose, capillary     Status: Abnormal   Collection Time: 06/29/16  8:38 PM  Result Value Ref Range   Glucose-Capillary 260 (H) 65 - 99 mg/dL  Basic metabolic panel     Status: Abnormal   Collection Time: 06/30/16  3:57 AM  Result Value Ref Range   Sodium 133 (L) 135 - 145 mmol/L   Potassium 4.1 3.5 - 5.1 mmol/L   Chloride 103 101 - 111 mmol/L   CO2 21 (L) 22 - 32 mmol/L   Glucose, Bld 243 (H) 65 - 99 mg/dL   BUN 24 (H) 6 - 20 mg/dL   Creatinine,  Ser 0.74 0.61 - 1.24 mg/dL   Calcium 8.3 (L) 8.9 - 10.3 mg/dL   GFR calc non Af Amer >60 >60 mL/min   GFR calc Af Amer >60 >60 mL/min    Comment: (NOTE) The eGFR has been calculated using the CKD EPI equation. This calculation has not been validated in all clinical situations. eGFR's persistently <60 mL/min signify possible Chronic Kidney Disease.  Anion gap 9 5 - 15  CBC     Status: Abnormal   Collection Time: 06/30/16  3:57 AM  Result Value Ref Range   WBC 10.2 4.0 - 10.5 K/uL   RBC 3.06 (L) 4.22 - 5.81 MIL/uL   Hemoglobin 9.8 (L) 13.0 - 17.0 g/dL   HCT 29.2 (L) 39.0 - 52.0 %   MCV 95.4 78.0 - 100.0 fL   MCH 32.0 26.0 - 34.0 pg   MCHC 33.6 30.0 - 36.0 g/dL   RDW 14.6 11.5 - 15.5 %   Platelets 177 150 - 400 K/uL  Glucose, capillary     Status: Abnormal   Collection Time: 06/30/16  9:04 AM  Result Value Ref Range   Glucose-Capillary 231 (H) 65 - 99 mg/dL  Glucose, capillary     Status: Abnormal   Collection Time: 06/30/16 11:52 AM  Result Value Ref Range   Glucose-Capillary 330 (H) 65 - 99 mg/dL    Dg Chest 2 View  06/29/2016  CLINICAL DATA:  82 year old male with a history of fever EXAM: CHEST  2 VIEW COMPARISON:  10/14/2014, 05/14/2014 FINDINGS: Cardiomediastinal silhouette unchanged with cardiomegaly. Surgical changes of prior median sternotomy and CABG, with aortic valve replacement. Low lung volumes with asymmetric elevation the right hemidiaphragm. Coarsened interstitial markings bilaterally. No confluent airspace disease. No pleural effusion or pneumothorax. Degenerative changes of the left greater than right shoulder. Surgical changes of the left humerus incompletely imaged. Calcifications of the aorta IMPRESSION: Chronic lung changes and low lung volumes without evidence of superimposed acute cardiopulmonary disease. Surgical changes of median sternotomy, CABG, aortic valve replacement. Aortic atherosclerosis. Signed, Dulcy Fanny. Earleen Newport, DO [facility] Radiology Electronically Signed   By: Corrie Mckusick D.O.   On: 06/29/2016 09:17   Dg Knee 2 Views Right  06/29/2016  CLINICAL DATA:  Fever EXAM: RIGHT KNEE - 1-2 VIEW COMPARISON:  06/09/2016 FINDINGS: No joint effusion. There is scratch set the hardware components of a right total knee arthroplasty device with long stems identified. No periprosthetic fracture or subluxation. No radio-opaque foreign bodies or soft tissue calcifications. IMPRESSION: 1. No acute findings. 2. Previous right knee arthroplasty. Electronically Signed   By: Kerby Moors M.D.   On: 06/29/2016 09:17    Review of Systems  Constitutional: Positive for fever, chills and malaise/fatigue.  Musculoskeletal: Positive for myalgias, joint pain and falls.  Neurological: Positive for weakness.   Blood pressure 125/41, pulse 54, temperature 97.8 F (36.6 C), temperature source Oral, resp. rate 20, height 5' 7"  (1.702 m), weight 78.9 kg (173 lb 15.1 oz), SpO2 97 %. Physical Exam  Constitutional:  Elderly male lying in bed and reports nursing just changed the dressing. He reports it is getting more painful with any movement of the right knee.   HENT:  Head: Normocephalic and atraumatic.  Cardiovascular: Normal rate.   Respiratory: Effort normal.  Musculoskeletal:  Right knee dressing just performed by nursing prior to my arrival. The nurse reports a small superficial wound about 2 x 2 cm with some peri-wound erythema. Minimal serous drainage with some odor.   Neurological: He is alert.    Assessment/Plan: Right knee wound with history of R TKR and recent dislocations- Will plan for saline wet to dry dressings for now. Agree with IV antibiotics and ID Consult. Will follow and plan for OR as medical status/schedule allows. Will check pre-albumin.  Patient reports not much appetite, but Ensure ordered and he is going to try this.  Dr. Marla Roe to see tomorrow.   Ziah Leandro,PA-C Plastic  Surgery [phone]

## 2016-06-30 NOTE — Progress Notes (Addendum)
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 He has been on keflex for the wound. He presents with confusion, fever, increased drainage and incontinence. He has been having frequent falls.   Subjective: Feels very tired. No nausea, vomiting, pain, cough, dyspnea or diarrhea.   Assessment & Plan:   Principal Problem:   Sepsis - fever 101, confusion, blood cultures growing gr + cocci in clusters - likely secondary to knee wound - ECHO, repeat cultures - ID consult - stop Zosyn - f/u sensitivities  Active Problems:  Right knee prosthesis - superficial wound infection- ortho and plastic surgery eval - PT eval    Diabetes mellitus without complication  - sugars elevated despite poor PO intake - resume Actos - SSI - diabetic diet    Obstructive sleep apnea CPAP    CAD (coronary artery disease) of artery bypass graft - Aspirin    S/P AVR    Chronic systolic CHF (congestive heart failure)    Parkinson's disease - Sinemet  BPH/ bladder issues? - on Proscar, Flomax and Myrbetriq   DVT prophylaxis: Lovenox Code Status: full code Family Communication:  Disposition Plan:  Consultants:   ID, ortho, plastic surgery Procedures:    Antimicrobials:  Anti-infectives    Start     Dose/Rate Route Frequency Ordered Stop   06/29/16 2200  vancomycin (VANCOCIN) IVPB 1000 mg/200 mL premix     1,000 mg 200 mL/hr over 60 Minutes Intravenous Every 12 hours 06/29/16 0939     06/29/16 1600  piperacillin-tazobactam (ZOSYN) IVPB 3.375 g     3.375 g 12.5 mL/hr over 240 Minutes Intravenous Every 8 hours 06/29/16 0940     06/29/16 0915  piperacillin-tazobactam (ZOSYN) IVPB 3.375 g     3.375 g 100  mL/hr over 30 Minutes Intravenous  Once 06/29/16 0911 06/29/16 0950   06/29/16 0915  vancomycin (VANCOCIN) IVPB 1000 mg/200 mL premix     1,000 mg 200 mL/hr over 60 Minutes Intravenous  Once 06/29/16 0912 06/29/16 1124       Objective: Filed Vitals:   06/29/16 1227 06/29/16 1420 06/29/16 2130 06/30/16 1302  BP:   126/57 125/41  Pulse:   61 54  Temp:  100.6 F (38.1 C) 98.3 F (36.8 C) 97.8 F (36.6 C)  TempSrc:  Axillary Oral Oral  Resp:   24 20  Height: 5\' 7"  (1.702 m)     Weight: 78.9 kg (173 lb 15.1 oz)     SpO2:   97% 97%    Intake/Output Summary (Last 24 hours) at 06/30/16 1708 Last data filed at 06/30/16 1436  Gross per 24 hour  Intake   2585 ml  Output   1600 ml  Net    985 ml   Filed Weights   06/29/16 0825 06/29/16 1227  Weight: 81.647 kg (180 lb) 78.9 kg (173 lb 15.1 oz)    Examination: General exam: Appears comfortable  HEENT: PERRLA, oral mucosa moist, no sclera icterus or thrush Respiratory system: Clear to auscultation. Respiratory effort normal. Cardiovascular system: S1 & S2 heard, RRR.  No murmurs  Gastrointestinal system: Abdomen soft, non-tender, nondistended. Normal bowel sound. No organomegaly Central nervous system: Alert and oriented. No focal neurological deficits. Extremities: No  cyanosis, clubbing or edema Skin: No rashes or ulcers Psychiatry:  Mood & affect appropriate.     Data Reviewed: I have personally reviewed following labs and imaging studies  CBC:  Recent Labs Lab 06/29/16 0847 06/30/16 0357  WBC 8.2 10.2  NEUTROABS 6.7  --   HGB 10.5* 9.8*  HCT 31.4* 29.2*  MCV 95.2 95.4  PLT 164 [id number]   Basic Metabolic Panel:  Recent Labs Lab 06/29/16 0847 06/30/16 0357  NA 133* 133*  K 4.0 4.1  CL 103 103  CO2 22 21*  GLUCOSE 225* 243*  BUN 31* 24*  CREATININE 0.80 0.74  CALCIUM 8.4* 8.3*   GFR: Estimated Creatinine Clearance: 59.7 mL/min (by C-G formula based on Cr of 0.74). Liver Function Tests:  Recent Labs Lab  06/29/16 0847  AST 37  ALT 6*  ALKPHOS 78  BILITOT 0.5  PROT 6.7  ALBUMIN 3.2*   No results for input(s): LIPASE, AMYLASE in the last 168 hours. No results for input(s): AMMONIA in the last 168 hours. Coagulation Profile: No results for input(s): INR, PROTIME in the last 168 hours. Cardiac Enzymes: No results for input(s): CKTOTAL, CKMB, CKMBINDEX, TROPONINI in the last 168 hours. BNP (last 3 results) No results for input(s): PROBNP in the last 8760 hours. HbA1C: No results for input(s): HGBA1C in the last 72 hours. CBG:  Recent Labs Lab 06/29/16 1735 06/29/16 2038 06/30/16 0904 06/30/16 1152 06/30/16 1644  GLUCAP 236* 260* 231* 330* 277*   Lipid Profile: No results for input(s): CHOL, HDL, LDLCALC, TRIG, CHOLHDL, LDLDIRECT in the last 72 hours. Thyroid Function Tests: No results for input(s): TSH, T4TOTAL, FREET4, T3FREE, THYROIDAB in the last 72 hours. Anemia Panel: No results for input(s): VITAMINB12, FOLATE, FERRITIN, TIBC, IRON, RETICCTPCT in the last 72 hours. Urine analysis:    Component Value Date/Time   COLORURINE YELLOW 06/29/2016 0907   APPEARANCEUR CLOUDY* 06/29/2016 0907   LABSPEC 1.026 06/29/2016 0907   PHURINE 5.5 06/29/2016 0907   GLUCOSEU 100* 06/29/2016 0907   HGBUR NEGATIVE 06/29/2016 0907   HGBUR negative 02/25/2010 0743   BILIRUBINUR NEGATIVE 06/29/2016 0907   BILIRUBINUR n 02/25/2016 1237   KETONESUR NEGATIVE 06/29/2016 0907   PROTEINUR 100* 06/29/2016 0907   PROTEINUR 1+ 02/25/2016 1237   UROBILINOGEN 0.2 02/25/2016 1237   UROBILINOGEN 0.2 08/20/2010 1651   NITRITE NEGATIVE 06/29/2016 0907   NITRITE n 02/25/2016 1237   LEUKOCYTESUR NEGATIVE 06/29/2016 0907   Sepsis Labs: @LABRCNTIP (procalcitonin:4,lacticidven:4) ) Recent Results (from the past 240 hour(s))  Culture, blood (Routine X 2)     Status: None (Preliminary result)   Collection Time: 06/29/16  8:45 AM  Result Value Ref Range Status   Specimen Description BLOOD RIGHT  ANTECUBITAL  Final   Special Requests BOTTLES DRAWN AEROBIC AND ANAEROBIC 5ML  Final   Culture  Setup Time   Final    GRAM POSITIVE COCCI IN CLUSTERS IN BOTH AEROBIC AND ANAEROBIC BOTTLES Organism ID to follow CRITICAL RESULT CALLED TO, READ BACK BY AND VERIFIED WITH: MLT T.MULLINS [id number] @0710  MLM CRITICAL RESULT CALLED TO, READ BACK BY AND VERIFIED WITH: SEAY,K. RN AT QN:[id number] 06/30/16 MULLINS,T    Culture GRAM POSITIVE COCCI  Final   Report Status PENDING  Incomplete  Blood Culture ID Panel (Reflexed)     Status: None   Collection Time: 06/29/16  8:45 AM  Result Value Ref Range Status   Enterococcus species NOT DETECTED NOT DETECTED Final   Vancomycin resistance NOT DETECTED NOT DETECTED Final   Listeria  monocytogenes NOT DETECTED NOT DETECTED Final   Staphylococcus species NOT DETECTED NOT DETECTED Final   Staphylococcus aureus NOT DETECTED NOT DETECTED Final   Methicillin resistance NOT DETECTED NOT DETECTED Final   Streptococcus species NOT DETECTED NOT DETECTED Final   Streptococcus agalactiae NOT DETECTED NOT DETECTED Final   Streptococcus pneumoniae NOT DETECTED NOT DETECTED Final   Streptococcus pyogenes NOT DETECTED NOT DETECTED Final   Acinetobacter baumannii NOT DETECTED NOT DETECTED Final   Enterobacteriaceae species NOT DETECTED NOT DETECTED Final   Enterobacter cloacae complex NOT DETECTED NOT DETECTED Final   Escherichia coli NOT DETECTED NOT DETECTED Final   Klebsiella oxytoca NOT DETECTED NOT DETECTED Final   Klebsiella pneumoniae NOT DETECTED NOT DETECTED Final   Proteus species NOT DETECTED NOT DETECTED Final   Serratia marcescens NOT DETECTED NOT DETECTED Final   Carbapenem resistance NOT DETECTED NOT DETECTED Final   Haemophilus influenzae NOT DETECTED NOT DETECTED Final   Neisseria meningitidis NOT DETECTED NOT DETECTED Final   Pseudomonas aeruginosa NOT DETECTED NOT DETECTED Final   Candida albicans NOT DETECTED NOT DETECTED Final   Candida glabrata NOT  DETECTED NOT DETECTED Final   Candida krusei NOT DETECTED NOT DETECTED Final   Candida parapsilosis NOT DETECTED NOT DETECTED Final   Candida tropicalis NOT DETECTED NOT DETECTED Final    Comment: Performed at [facility]  Culture, blood (Routine X 2)     Status: None (Preliminary result)   Collection Time: 06/29/16  8:47 AM  Result Value Ref Range Status   Specimen Description BLOOD LEFT HAND  Final   Special Requests BOTTLES DRAWN AEROBIC AND ANAEROBIC 5CC  Final   Culture  Setup Time   Final    GRAM POSITIVE COCCI IN CLUSTERS IN BOTH AEROBIC AND ANAEROBIC BOTTLES CRITICAL VALUE NOTED.  VALUE IS CONSISTENT WITH PREVIOUSLY REPORTED AND CALLED VALUE.    Culture   Final    NO GROWTH 1 DAY Performed at [facility]    Report Status PENDING  Incomplete  Wound or Superficial Culture     Status: None (Preliminary result)   Collection Time: 06/29/16  8:47 AM  Result Value Ref Range Status   Specimen Description WOUND RIGHT KNEE  Final   Special Requests Normal  Final   Gram Stain   Final    FEW WBC PRESENT, PREDOMINANTLY PMN FEW GRAM POSITIVE COCCI IN PAIRS    Culture   Final    CULTURE REINCUBATED FOR BETTER GROWTH Performed at [facility]    Report Status PENDING  Incomplete  Urine C&S     Status: Abnormal (Preliminary result)   Collection Time: 06/29/16  9:07 AM  Result Value Ref Range Status   Specimen Description URINE, CLEAN CATCH  Final   Special Requests Normal  Final   Culture 40,000 COLONIES/mL GRAM NEGATIVE RODS (A)  Final   Report Status PENDING  Incomplete  MRSA PCR Screening     Status: None   Collection Time: 06/29/16  1:27 PM  Result Value Ref Range Status   MRSA by PCR NEGATIVE NEGATIVE Final    Comment:        The GeneXpert MRSA Assay (FDA approved for NASAL specimens only), is one component of a comprehensive MRSA colonization surveillance program. It is not intended to diagnose MRSA infection nor to guide or monitor treatment  for MRSA infections.          Radiology Studies: Dg Chest 2 View  06/29/2016  CLINICAL DATA:  82 year old male  with a history of fever EXAM: CHEST  2 VIEW COMPARISON:  10/14/2014, 05/14/2014 FINDINGS: Cardiomediastinal silhouette unchanged with cardiomegaly. Surgical changes of prior median sternotomy and CABG, with aortic valve replacement. Low lung volumes with asymmetric elevation the right hemidiaphragm. Coarsened interstitial markings bilaterally. No confluent airspace disease. No pleural effusion or pneumothorax. Degenerative changes of the left greater than right shoulder. Surgical changes of the left humerus incompletely imaged. Calcifications of the aorta IMPRESSION: Chronic lung changes and low lung volumes without evidence of superimposed acute cardiopulmonary disease. Surgical changes of median sternotomy, CABG, aortic valve replacement. Aortic atherosclerosis. Signed, Dulcy Fanny. Earleen Newport, DO [facility] Electronically Signed   By: Corrie Mckusick D.O.   On: 06/29/2016 09:17   Dg Knee 2 Views Right  06/29/2016  CLINICAL DATA:  Fever EXAM: RIGHT KNEE - 1-2 VIEW COMPARISON:  06/09/2016 FINDINGS: No joint effusion. There is scratch set the hardware components of a right total knee arthroplasty device with long stems identified. No periprosthetic fracture or subluxation. No radio-opaque foreign bodies or soft tissue calcifications. IMPRESSION: 1. No acute findings. 2. Previous right knee arthroplasty. Electronically Signed   By: Kerby Moors M.D.   On: 06/29/2016 09:17      Scheduled Meds: . aspirin  81 mg Oral Daily  . calcium-vitamin D  1 tablet Oral BID  . carbidopa-levodopa  2 tablet Oral 4 times per day  . enoxaparin (LOVENOX) injection  40 mg Subcutaneous Q24H  . feeding supplement (GLUCERNA 1.2 CAL)  237 mL Oral TID BM  . finasteride  5 mg Oral Daily  . insulin aspart  0-9 Units Subcutaneous TID WC  . INTEGRA PLUS  1  capsule Oral q morning - 10a  . mirabegron ER  50 mg Oral Daily  . multivitamin with minerals  1 tablet Oral Daily  . pantoprazole  40 mg Oral BID  . pioglitazone  45 mg Oral Daily  . piperacillin-tazobactam (ZOSYN)  IV  3.375 g Intravenous Q8H  . rosuvastatin  10 mg Oral Daily  . saccharomyces boulardii  250 mg Oral BID  . tamsulosin  0.4 mg Oral QPC breakfast  . vancomycin  1,000 mg Intravenous Q12H  . vitamin A  10,000 Units Oral Daily  . zinc sulfate  220 mg Oral Daily   Continuous Infusions: . sodium chloride 100 mL/hr at 06/30/16 1653     LOS: 1 day    Time spent in minutes: 18    [place], MD [facility] Pager: [email or web address] Password [facility] 06/30/2016, 5:08 PM

## 2016-06-30 NOTE — Progress Notes (Signed)
CRITICAL VALUE ALERT  Critical value received: Gram Stain aerobic & anaerobic bottles showing gram + cocci in clusters  Date of notification: 06/30/16  Time of notification: [id number]  Critical value read back:Yes.    Nurse who received alert: Willene Hatchet, RN  MD notified (1st page): Rizwan  Time of first page: [phone]  MD notified (2nd page):  Time of second page:  Responding MD: Wynelle Cleveland  Time MD responded: [phone]

## 2016-06-30 NOTE — Progress Notes (Signed)
Subjective:     Patient reports pain as 1 on 0-10 scale.  Urine culture shows no growth but SUPERFICIAL wound culture shows on Gm Stain gram positive Cocci. His wound showed no signs of any purulent material last night. This has been a chronic ulceration for Six months. Awaiting Culture results of wound. Dr. Migdalia Dk is managing his wound with our assistance Thus far there has NOT been any joint involvement.Everything has been superficial.Blood Culture has been Negative so far. Blood Culture    Component Value Date/Time   SDES BLOOD LEFT HAND 06/29/2016 0847   SDES WOUND RIGHT KNEE 06/29/2016 0847   SPECREQUEST BOTTLES DRAWN AEROBIC AND ANAEROBIC 5CC 06/29/2016 0847   SPECREQUEST Normal 06/29/2016 0847   CULT  06/29/2016 0847    NO GROWTH < 12 HOURS Performed at [place] PENDING 06/29/2016 0847   REPTSTATUS PENDING 06/29/2016 0847   REPTSTATUS PENDING 06/29/2016 0847    Culture Negative so far.  Objective: Vital signs in last 24 hours: Temp:  [98.3 F (36.8 C)-101.3 F (38.5 C)] 98.3 F (36.8 C) (07/11 2130) Pulse Rate:  [59-92] 61 (07/11 2130) Resp:  [19-28] 24 (07/11 2130) BP: (109-132)/(46-63) 126/57 mmHg (07/11 2130) SpO2:  [93 %-98 %] 97 % (07/11 2130) Weight:  [78.9 kg (173 lb 15.1 oz)-81.647 kg (180 lb)] 78.9 kg (173 lb 15.1 oz) (07/11 1227)  Intake/Output from previous day: 07/11 0701 - 07/12 0700 In: [id number] [P.O.:120; I.V.:885; IV Piggyback:250] Out: 1150 [Urine:1150] Intake/Output this shift:     Recent Labs  06/29/16 0847 06/30/16 0357  HGB 10.5* 9.8*    Recent Labs  06/29/16 0847 06/30/16 0357  WBC 8.2 10.2  RBC 3.30* 3.06*  HCT 31.4* 29.2*  PLT 164 177    Recent Labs  06/29/16 0847 06/30/16 0357  NA 133* 133*  K 4.0 4.1  CL 103 103  CO2 22 21*  BUN 31* 24*  CREATININE 0.80 0.74  GLUCOSE 225* 243*  CALCIUM 8.4* 8.3*   No results for input(s): LABPT, INR in the last 72 hours.  Compartment soft  Assessment/Plan:      Up with therapy. From Ortho standpoint he may be discharged when medically cleared.  Ivannah Zody A 06/30/2016, 7:37 AM

## 2016-07-01 ENCOUNTER — Inpatient Hospital Stay (HOSPITAL_COMMUNITY): Payer: Medicare Other

## 2016-07-01 DIAGNOSIS — G4733 Obstructive sleep apnea (adult) (pediatric): Secondary | ICD-10-CM

## 2016-07-01 DIAGNOSIS — G20 Parkinson's disease: Secondary | ICD-10-CM

## 2016-07-01 DIAGNOSIS — R8299 Other abnormal findings in urine: Secondary | ICD-10-CM

## 2016-07-01 DIAGNOSIS — M00061 Staphylococcal arthritis, right knee: Secondary | ICD-10-CM

## 2016-07-01 DIAGNOSIS — A4101 Sepsis due to Methicillin susceptible Staphylococcus aureus: Secondary | ICD-10-CM

## 2016-07-01 DIAGNOSIS — E119 Type 2 diabetes mellitus without complications: Secondary | ICD-10-CM

## 2016-07-01 DIAGNOSIS — D509 Iron deficiency anemia, unspecified: Secondary | ICD-10-CM

## 2016-07-01 DIAGNOSIS — B957 Other staphylococcus as the cause of diseases classified elsewhere: Secondary | ICD-10-CM

## 2016-07-01 LAB — BASIC METABOLIC PANEL
Anion gap: 6 (ref 5–15)
BUN: 21 mg/dL — AB (ref 6–20)
CALCIUM: 8.2 mg/dL — AB (ref 8.9–10.3)
CO2: 23 mmol/L (ref 22–32)
CREATININE: 0.72 mg/dL (ref 0.61–1.24)
Chloride: 105 mmol/L (ref 101–111)
GFR calc Af Amer: >60 mL/min (ref >60)
GFR calc non Af Amer: >60 mL/min (ref >60)
GLUCOSE: 245 mg/dL — AB (ref 65–99)
Potassium: 4.4 mmol/L (ref 3.5–5.1)
Sodium: 134 mmol/L — ABNORMAL LOW (ref 135–145)

## 2016-07-01 LAB — URINE CULTURE
Culture: 40000 — AB
Special Requests: NORMAL

## 2016-07-01 LAB — PREALBUMIN: Prealbumin: 11 mg/dL — ABNORMAL LOW (ref 18–38)

## 2016-07-01 LAB — ECHOCARDIOGRAM COMPLETE
Height: 67 in
WEIGHTICAEL: 2783.09 [oz_av]

## 2016-07-01 LAB — GLUCOSE, CAPILLARY
Glucose-Capillary: 231 mg/dL — ABNORMAL HIGH (ref 65–99)
Glucose-Capillary: 316 mg/dL — ABNORMAL HIGH (ref 65–99)
Glucose-Capillary: 282 mg/dL — ABNORMAL HIGH (ref 65–99)
Glucose-Capillary: 310 mg/dL — ABNORMAL HIGH (ref 65–99)

## 2016-07-01 MED ORDER — PERFLUTREN LIPID MICROSPHERE
1.0000 mL | INTRAVENOUS | Status: AC | PRN
  Administered 2016-07-01: 2 mL via INTRAVENOUS

## 2016-07-01 MED ORDER — PERFLUTREN LIPID MICROSPHERE
INTRAVENOUS | Status: AC
  Filled 2016-07-01: qty 10

## 2016-07-01 MED ORDER — INSULIN GLARGINE 100 UNIT/ML ~~LOC~~ SOLN
5.0000 [IU] | Freq: Every day | SUBCUTANEOUS | Status: DC
  Administered 2016-07-01: 5 [IU] via SUBCUTANEOUS
  Filled 2016-07-01: qty 0.05

## 2016-07-01 MED ORDER — ALBUTEROL SULFATE (2.5 MG/3ML) 0.083% IN NEBU
2.5000 mg | INHALATION_SOLUTION | Freq: Once | RESPIRATORY_TRACT | Status: AC
  Administered 2016-07-01: 2.5 mg via RESPIRATORY_TRACT
  Filled 2016-07-01: qty 3

## 2016-07-01 MED ORDER — ZOLPIDEM TARTRATE 5 MG PO TABS
5.0000 mg | ORAL_TABLET | Freq: Every evening | ORAL | Status: DC | PRN
  Administered 2016-07-01: 5 mg via ORAL
  Filled 2016-07-01: qty 1

## 2016-07-01 MED ORDER — LINAGLIPTIN 5 MG PO TABS
5.0000 mg | ORAL_TABLET | Freq: Every day | ORAL | Status: DC
  Administered 2016-07-01 – 2016-07-09 (×9): 5 mg via ORAL
  Filled 2016-07-01 (×9): qty 1

## 2016-07-01 MED ORDER — INSULIN ASPART 100 UNIT/ML ~~LOC~~ SOLN
0.0000 [IU] | Freq: Every day | SUBCUTANEOUS | Status: DC
Start: 2016-07-01 — End: 2016-07-09
  Administered 2016-07-01: 4 [IU] via SUBCUTANEOUS
  Administered 2016-07-03 – 2016-07-04 (×2): 2 [IU] via SUBCUTANEOUS
  Administered 2016-07-05: 5 [IU] via SUBCUTANEOUS
  Administered 2016-07-06: 4 [IU] via SUBCUTANEOUS
  Administered 2016-07-07: 5 [IU] via SUBCUTANEOUS
  Administered 2016-07-08: 4 [IU] via SUBCUTANEOUS

## 2016-07-01 MED ORDER — DICLOFENAC SODIUM 75 MG PO TBEC
75.0000 mg | DELAYED_RELEASE_TABLET | Freq: Two times a day (BID) | ORAL | Status: DC
  Administered 2016-07-01 – 2016-07-09 (×15): 75 mg via ORAL
  Filled 2016-07-01 (×17): qty 1

## 2016-07-01 MED ORDER — ENSURE ENLIVE PO LIQD
237.0000 mL | Freq: Three times a day (TID) | ORAL | Status: DC
  Administered 2016-07-01 – 2016-07-09 (×19): 237 mL via ORAL

## 2016-07-01 MED ORDER — VITAMIN A 10000 UNITS PO CAPS
10000.0000 [IU] | ORAL_CAPSULE | Freq: Every day | ORAL | Status: DC
  Administered 2016-07-01 – 2016-07-08 (×7): 10000 [IU] via ORAL
  Filled 2016-07-01 (×10): qty 1

## 2016-07-01 MED ORDER — INSULIN ASPART 100 UNIT/ML ~~LOC~~ SOLN
0.0000 [IU] | Freq: Three times a day (TID) | SUBCUTANEOUS | Status: DC
  Administered 2016-07-01 – 2016-07-02 (×2): 8 [IU] via SUBCUTANEOUS
  Administered 2016-07-02: 5 [IU] via SUBCUTANEOUS

## 2016-07-01 NOTE — Progress Notes (Signed)
Inpatient Diabetes Program Recommendations  AACE/ADA: New Consensus Statement on Inpatient Glycemic Control (2015)  Target Ranges:  Prepandial:   less than 140 mg/dL      Peak postprandial:   less than 180 mg/dL (1-2 hours)      Critically ill patients:  140 - 180 mg/dL   Lab Results  Component Value Date   GLUCAP 316* 07/01/2016   HGBA1C 7.6* 02/25/2016    Review of Glycemic Control  Results for STAFFORD MD, KIMAR SALEE (MRN [id number]) as of 07/01/2016 13:28  Ref. Range 06/30/2016 11:52 06/30/2016 16:44 06/30/2016 21:51 07/01/2016 07:39 07/01/2016 12:36  Glucose-Capillary Latest Ref Range: 65-99 mg/dL 330 (H) 277 (H) 290 (H) 231 (H) 316 (H)   Would likely benefit from addition of basal insulin - BMET > 200 mg/dL x past 3 days.  Inpatient Diabetes Program Recommendations:    Consider Lantus 12 units QHS.  Will ask RN to give supplement after the meal. Blood sugars may be higher if pt had supplement within 1-2 hours of monitoring.  Continue to follow. Thank you. Lorenda Peck, RD, LDN, CDE Inpatient Diabetes Coordinator [phone]

## 2016-07-01 NOTE — Progress Notes (Signed)
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 He has been on keflex for the wound. He presents with confusion, fever, increased drainage and incontinence. He has been having frequent falls.   Subjective: Tiredness better. No new complaints.   Assessment & Plan:   Principal Problem:   Sepsis - fever 101, confusion, blood cultures growing gr + cocci in clusters- repeat cultures + 1 out of 2 sets - likely secondary to knee wound - ECHO, repeat cultures - ID consult - stop Zosyn- cont Vanc - f/u sensitivities  Active Problems:  Right knee dislocation - s/p closed reduction x 2  - superficial wound infection- ortho and plastic surgery eval - PT eval   Diabetes mellitus without complication  - sugars elevated despite poor PO intake- recently received epidural steroid - resumed Actos and Januvia - increase SSI- may need to add Lantus  - A1c 7.6 in 3/17 - diabetic diet    Obstructive sleep apnea CPAP    CAD (coronary artery disease) of artery bypass graft - Aspirin    S/P AVR    Chronic systolic CHF (congestive heart failure) - will stop IVF today and follow I and O    Parkinson's disease - Sinemet  BPH/ bladder issues? - on Proscar, Flomax and Myrbetriq   DVT prophylaxis: Lovenox Code Status: full code Family Communication:  Disposition Plan:  Consultants:   ID, ortho, plastic surgery Procedures:    Antimicrobials:  Anti-infectives    Start     Dose/Rate Route Frequency Ordered Stop   06/29/16 2200  vancomycin (VANCOCIN) IVPB 1000 mg/200 mL premix     1,000 mg 200 mL/hr over 60 Minutes Intravenous Every 12 hours 06/29/16 0939     06/29/16 1600  piperacillin-tazobactam  (ZOSYN) IVPB 3.375 g  Status:  Discontinued     3.375 g 12.5 mL/hr over 240 Minutes Intravenous Every 8 hours 06/29/16 0940 07/01/16 1335   06/29/16 0915  piperacillin-tazobactam (ZOSYN) IVPB 3.375 g     3.375 g 100 mL/hr over 30 Minutes Intravenous  Once 06/29/16 0911 06/29/16 0950   06/29/16 0915  vancomycin (VANCOCIN) IVPB 1000 mg/200 mL premix     1,000 mg 200 mL/hr over 60 Minutes Intravenous  Once 06/29/16 0912 06/29/16 1124       Objective: Filed Vitals:   06/30/16 2154 07/01/16 0451 07/01/16 1405 07/01/16 1550  BP: 101/43 127/56  114/49  Pulse: 58 95  58  Temp: 98.5 F (36.9 C) 98.4 F (36.9 C)  98.5 F (36.9 C)  TempSrc: Oral Oral  Oral  Resp: 22 20  20   Height:      Weight:      SpO2: 100% 93% 97% 95%    Intake/Output Summary (Last 24 hours) at 07/01/16 1800 Last data filed at 07/01/16 1551  Gross per 24 hour  Intake   3430 ml  Output   1425 ml  Net   2005 ml   Filed Weights   06/29/16 0825 06/29/16 1227  Weight: 81.647 kg (180 lb) 78.9 kg (173 lb 15.1 oz)    Examination: General exam: Appears comfortable  HEENT: PERRLA, oral mucosa moist, no sclera icterus or thrush Respiratory system: Clear to auscultation. Respiratory effort normal. Cardiovascular  system: S1 & S2 heard, RRR.  No murmurs  Gastrointestinal system: Abdomen soft, non-tender, nondistended. Normal bowel sound. No organomegaly Central nervous system: Alert and oriented. No focal neurological deficits. Extremities: No cyanosis, clubbing or edema Skin: ulcers on knee   Psychiatry:  Mood & affect appropriate.     Data Reviewed: I have personally reviewed following labs and imaging studies  CBC:  Recent Labs Lab 06/29/16 0847 06/30/16 0357  WBC 8.2 10.2  NEUTROABS 6.7  --   HGB 10.5* 9.8*  HCT 31.4* 29.2*  MCV 95.2 95.4  PLT 164 [id number]   Basic Metabolic Panel:  Recent Labs Lab 06/29/16 0847 06/30/16 0357 07/01/16 0445  NA 133* 133* 134*  K 4.0 4.1 4.4  CL 103 103 105  CO2  22 21* 23  GLUCOSE 225* 243* 245*  BUN 31* 24* 21*  CREATININE 0.80 0.74 0.72  CALCIUM 8.4* 8.3* 8.2*   GFR: Estimated Creatinine Clearance: 59.7 mL/min (by C-G formula based on Cr of 0.72). Liver Function Tests:  Recent Labs Lab 06/29/16 0847  AST 37  ALT 6*  ALKPHOS 78  BILITOT 0.5  PROT 6.7  ALBUMIN 3.2*   No results for input(s): LIPASE, AMYLASE in the last 168 hours. No results for input(s): AMMONIA in the last 168 hours. Coagulation Profile: No results for input(s): INR, PROTIME in the last 168 hours. Cardiac Enzymes: No results for input(s): CKTOTAL, CKMB, CKMBINDEX, TROPONINI in the last 168 hours. BNP (last 3 results) No results for input(s): PROBNP in the last 8760 hours. HbA1C: No results for input(s): HGBA1C in the last 72 hours. CBG:  Recent Labs Lab 06/30/16 1644 06/30/16 2151 07/01/16 0739 07/01/16 1236 07/01/16 1653  GLUCAP 277* 290* 231* 316* 282*   Lipid Profile: No results for input(s): CHOL, HDL, LDLCALC, TRIG, CHOLHDL, LDLDIRECT in the last 72 hours. Thyroid Function Tests: No results for input(s): TSH, T4TOTAL, FREET4, T3FREE, THYROIDAB in the last 72 hours. Anemia Panel: No results for input(s): VITAMINB12, FOLATE, FERRITIN, TIBC, IRON, RETICCTPCT in the last 72 hours. Urine analysis:    Component Value Date/Time   COLORURINE YELLOW 06/29/2016 0907   APPEARANCEUR CLOUDY* 06/29/2016 0907   LABSPEC 1.026 06/29/2016 0907   PHURINE 5.5 06/29/2016 0907   GLUCOSEU 100* 06/29/2016 0907   HGBUR NEGATIVE 06/29/2016 0907   HGBUR negative 02/25/2010 0743   BILIRUBINUR NEGATIVE 06/29/2016 0907   BILIRUBINUR n 02/25/2016 1237   KETONESUR NEGATIVE 06/29/2016 0907   PROTEINUR 100* 06/29/2016 0907   PROTEINUR 1+ 02/25/2016 1237   UROBILINOGEN 0.2 02/25/2016 1237   UROBILINOGEN 0.2 08/20/2010 1651   NITRITE NEGATIVE 06/29/2016 0907   NITRITE n 02/25/2016 1237   LEUKOCYTESUR NEGATIVE 06/29/2016 0907   Sepsis  Labs: @LABRCNTIP (procalcitonin:4,lacticidven:4) ) Recent Results (from the past 240 hour(s))  Culture, blood (Routine X 2)     Status: None (Preliminary result)   Collection Time: 06/29/16  8:45 AM  Result Value Ref Range Status   Specimen Description BLOOD RIGHT ANTECUBITAL  Final   Special Requests BOTTLES DRAWN AEROBIC AND ANAEROBIC 5ML  Final   Culture  Setup Time   Final    GRAM POSITIVE COCCI IN CLUSTERS IN BOTH AEROBIC AND ANAEROBIC BOTTLES CRITICAL RESULT CALLED TO, READ BACK BY AND VERIFIED WITH: MLT T.MULLINS [id number] @0710  MLM CRITICAL RESULT CALLED TO, READ BACK BY AND VERIFIED WITHSherian Maroon RN AT [id number] 06/30/16 MULLINS,T Performed at [facility]  Final   Report Status PENDING  Incomplete  Blood  Culture ID Panel (Reflexed)     Status: None   Collection Time: 06/29/16  8:45 AM  Result Value Ref Range Status   Enterococcus species NOT DETECTED NOT DETECTED Final   Vancomycin resistance NOT DETECTED NOT DETECTED Final   Listeria monocytogenes NOT DETECTED NOT DETECTED Final   Staphylococcus species NOT DETECTED NOT DETECTED Final   Staphylococcus aureus NOT DETECTED NOT DETECTED Final   Methicillin resistance NOT DETECTED NOT DETECTED Final   Streptococcus species NOT DETECTED NOT DETECTED Final   Streptococcus agalactiae NOT DETECTED NOT DETECTED Final   Streptococcus pneumoniae NOT DETECTED NOT DETECTED Final   Streptococcus pyogenes NOT DETECTED NOT DETECTED Final   Acinetobacter baumannii NOT DETECTED NOT DETECTED Final   Enterobacteriaceae species NOT DETECTED NOT DETECTED Final   Enterobacter cloacae complex NOT DETECTED NOT DETECTED Final   Escherichia coli NOT DETECTED NOT DETECTED Final   Klebsiella oxytoca NOT DETECTED NOT DETECTED Final   Klebsiella pneumoniae NOT DETECTED NOT DETECTED Final   Proteus species NOT DETECTED NOT DETECTED Final   Serratia marcescens NOT DETECTED NOT DETECTED Final   Carbapenem resistance NOT  DETECTED NOT DETECTED Final   Haemophilus influenzae NOT DETECTED NOT DETECTED Final   Neisseria meningitidis NOT DETECTED NOT DETECTED Final   Pseudomonas aeruginosa NOT DETECTED NOT DETECTED Final   Candida albicans NOT DETECTED NOT DETECTED Final   Candida glabrata NOT DETECTED NOT DETECTED Final   Candida krusei NOT DETECTED NOT DETECTED Final   Candida parapsilosis NOT DETECTED NOT DETECTED Final   Candida tropicalis NOT DETECTED NOT DETECTED Final    Comment: Performed at [facility]  Culture, blood (Routine X 2)     Status: None (Preliminary result)   Collection Time: 06/29/16  8:47 AM  Result Value Ref Range Status   Specimen Description BLOOD LEFT HAND  Final   Special Requests BOTTLES DRAWN AEROBIC AND ANAEROBIC 5CC  Final   Culture  Setup Time   Final    GRAM POSITIVE COCCI IN CLUSTERS IN BOTH AEROBIC AND ANAEROBIC BOTTLES CRITICAL VALUE NOTED.  VALUE IS CONSISTENT WITH PREVIOUSLY REPORTED AND CALLED VALUE. Performed at [place]  Final   Report Status PENDING  Incomplete  Wound or Superficial Culture     Status: None (Preliminary result)   Collection Time: 06/29/16  8:47 AM  Result Value Ref Range Status   Specimen Description WOUND RIGHT KNEE  Final   Special Requests Normal  Final   Gram Stain   Final    FEW WBC PRESENT, PREDOMINANTLY PMN FEW GRAM POSITIVE COCCI IN PAIRS    Culture   Final    CULTURE REINCUBATED FOR BETTER GROWTH Performed at [facility]    Report Status PENDING  Incomplete  Urine C&S     Status: Abnormal   Collection Time: 06/29/16  9:07 AM  Result Value Ref Range Status   Specimen Description URINE, CLEAN CATCH  Final   Special Requests Normal  Final   Culture 40,000 COLONIES/mL ENTEROBACTER AEROGENES (A)  Final   Report Status 07/01/2016 FINAL  Final   Organism ID, Bacteria ENTEROBACTER AEROGENES (A)  Final      Susceptibility   Enterobacter aerogenes - MIC*    CEFAZOLIN >=64  RESISTANT Resistant     CEFTRIAXONE >=64 RESISTANT Resistant     CIPROFLOXACIN <=0.25 SENSITIVE Sensitive     GENTAMICIN <=1 SENSITIVE Sensitive     IMIPENEM <=0.25 SENSITIVE Sensitive     NITROFURANTOIN  64 INTERMEDIATE Intermediate     TRIMETH/SULFA <=20 SENSITIVE Sensitive     PIP/TAZO >=128 RESISTANT Resistant     * 40,000 COLONIES/mL ENTEROBACTER AEROGENES  MRSA PCR Screening     Status: None   Collection Time: 06/29/16  1:27 PM  Result Value Ref Range Status   MRSA by PCR NEGATIVE NEGATIVE Final    Comment:        The GeneXpert MRSA Assay (FDA approved for NASAL specimens only), is one component of a comprehensive MRSA colonization surveillance program. It is not intended to diagnose MRSA infection nor to guide or monitor treatment for MRSA infections.   Culture, blood (Routine X 2) w Reflex to ID Panel     Status: None (Preliminary result)   Collection Time: 06/30/16 12:37 PM  Result Value Ref Range Status   Specimen Description BLOOD LEFT ARM  Final   Special Requests BOTTLES DRAWN AEROBIC ONLY 5CC  Final   Culture   Final    NO GROWTH 1 DAY Performed at [facility]    Report Status PENDING  Incomplete  Culture, blood (Routine X 2) w Reflex to ID Panel     Status: None (Preliminary result)   Collection Time: 06/30/16 12:41 PM  Result Value Ref Range Status   Specimen Description BLOOD LEFT HAND  Final   Special Requests IN PEDIATRIC BOTTLE 2CC  Final   Culture  Setup Time   Final    GRAM POSITIVE COCCI IN CLUSTERS AEROBIC BOTTLE ONLY CRITICAL RESULT CALLED TO, READ BACK BY AND VERIFIED WITH: T. PICKERING [facility] AT 1209 07/01/16 BY Rush Landmark Performed at [place] COCCI IN CLUSTERS  Final   Report Status PENDING  Incomplete         Radiology Studies: No results found.    Scheduled Meds: . aspirin  81 mg Oral Daily  . calcium-vitamin D  1 tablet Oral BID  . carbidopa-levodopa  2 tablet Oral 4 times per day  .  diclofenac  75 mg Oral BID AC  . enoxaparin (LOVENOX) injection  40 mg Subcutaneous Q24H  . feeding supplement (ENSURE ENLIVE)  237 mL Oral TID BM  . finasteride  5 mg Oral Daily  . insulin aspart  0-15 Units Subcutaneous TID WC  . insulin aspart  0-5 Units Subcutaneous QHS  . INTEGRA PLUS  1 capsule Oral q morning - 10a  . linagliptin  5 mg Oral Daily  . mirabegron ER  50 mg Oral Daily  . multivitamin with minerals  1 tablet Oral Daily  . pantoprazole  40 mg Oral BID  . pioglitazone  45 mg Oral Daily  . rosuvastatin  10 mg Oral Daily  . saccharomyces boulardii  250 mg Oral BID  . tamsulosin  0.4 mg Oral QPC breakfast  . vancomycin  1,000 mg Intravenous Q12H  . vitamin A  10,000 Units Oral Daily  . zinc sulfate  220 mg Oral Daily   Continuous Infusions:     LOS: 2 days    Time spent in minutes: Wrens, MD [facility] Pager: [email or web address] Password [email or web address] 07/01/2016, 6:00 PM

## 2016-07-01 NOTE — Progress Notes (Signed)
PHARMACY - PHYSICIAN COMMUNICATION CRITICAL VALUE ALERT - BLOOD CULTURE   Blood cultures from 7/12 growing GPC clusters  BCID previously run on 7/11 Blood culture with GPC clusters (nothing detected).  BCID will not be repeated on this most recent culture since same bacterial morphology  Name of physician (or Provider) Contacted: Rizwan  Changes to prescribed antibiotics required: none, on vancomycin  Doreene Eland, PharmD, BCPS.   Pager: [phone] 07/01/2016 12:27 PM

## 2016-07-01 NOTE — Evaluation (Signed)
Physical Therapy Evaluation Patient Details Name: Stanley SONI MD MRN: [id number] DOB: [birth year] Today's Date: 07/01/2016   History of Present Illness  82 y.o. male with medical history significant of DM, PVD, CAD, S/P CABG, aortic stenosis, S/P AVR,chronic systolic CHF and Parkinson's disease who was admitted with confusion, fever, draining R knee wound, falls at home; sepsis .  Pt with hx of R TKA, R TKR requiring closed manipulation on 05/29/16 and then closed reduction of the patella on 06/09/16  Clinical Impression  Pt admitted with above diagnosis. Pt currently with functional limitations due to the deficits listed below (see PT Problem List).  Pt will benefit from skilled PT to increase their independence and safety with mobility to allow discharge to the venue listed below.  Pt with difficulty mobilizing today due to knee pain however was able to be assisted from bed to recliner.  Pt chronically wears knee brace due to lack of extensor mechanism.  Pt's brace from home was not properly donned and also ill fitting near joint due to dressing and edema.  Pt educated to doff brace when resting (due to wound) and would likely be better to wear KI with mobility at this time (RN also notified).  Uncertain of family ability to assist at home in current status, so recommend ST-SNF at this time if assist is not available at home.     Follow Up Recommendations Supervision/Assistance - 24 hour;SNF    Equipment Recommendations  None recommended by PT    Recommendations for Other Services       Precautions / Restrictions Precautions Precautions: Fall Required Braces or Orthoses: Other Brace/Splint Other Brace/Splint: knee brace due to lack of extensor mechanism, also has KI in room      Mobility  Bed Mobility Overal bed mobility: Needs Assistance Bed Mobility: Supine to Sit     Supine to sit: Mod assist     General bed mobility comments: assist for trunk upright and scooting to  EOB  Transfers Overall transfer level: Needs assistance Equipment used: Rolling walker (2 wheeled) Transfers: Sit to/from [facility] Sit to Stand: +2 physical assistance;Mod assist Stand pivot transfers: +2 physical assistance;Mod assist       General transfer comment: verbal cues for technique, only able to tolerate a few steps, pt felt unable to ambulate so went from bed to recliner, increased trunk flexion posture as pt reports spinal stenosis  Ambulation/Gait                Stairs            Wheelchair Mobility    Modified Rankin (Stroke Patients Only)       Balance                                             Pertinent Vitals/Pain Pain Assessment: Faces Faces Pain Scale: Hurts whole lot Pain Location: R knee with weight bearing Pain Descriptors / Indicators: Aching Pain Intervention(s): Limited activity within patient's tolerance;Monitored during session;Repositioned (RN notified of pain)    Home Living Family/patient expects to be discharged to:: Private residence Living Arrangements: Spouse/significant other Available Help at Discharge: Family;Available 24 hours/day Type of Home: House Home Access: Level entry     Home Layout: One level Home Equipment: Walker - 4 wheels;Shower seat;Walker - 2 wheels      Prior Function Level of Independence:  Independent with assistive device(s)               Hand Dominance        Extremity/Trunk Assessment               Lower Extremity Assessment: RLE deficits/detail RLE Deficits / Details: extensive knee hx, wears brace due to lack of extensor mechanism, chronic R foot wounds and draining wound on R knee (with dressing)    Cervical / Trunk Assessment: Kyphotic;Other exceptions  Communication   Communication: No difficulties  Cognition Arousal/Alertness: Awake/alert Behavior During Therapy: WFL for tasks assessed/performed Overall Cognitive Status:  Within Functional Limits for tasks assessed                      General Comments      Exercises        Assessment/Plan    PT Assessment Patient needs continued PT services  PT Diagnosis Difficulty walking   PT Problem List Decreased strength;Decreased mobility;Decreased knowledge of use of DME;Pain  PT Treatment Interventions DME instruction;Gait training;Functional mobility training;Patient/family education;Therapeutic activities;Therapeutic exercise   PT Goals (Current goals can be found in the Care Plan section) Acute Rehab PT Goals PT Goal Formulation: With patient Time For Goal Achievement: 07/15/16 Potential to Achieve Goals: Good    Frequency Min 3X/week   Barriers to discharge        Co-evaluation               End of Session Equipment Utilized During Treatment: Gait belt Activity Tolerance: Patient limited by pain Patient left: in chair;with call bell/phone within reach;with chair alarm set Nurse Communication: Mobility status;Precautions         Time: [id number] PT Time Calculation (min) (ACUTE ONLY): 32 min   Charges:   PT Evaluation $PT Eval Moderate Complexity: 1 Procedure     PT G Codes:        Haylen Shelnutt,KATHrine E 07/01/2016, 4:03 PM Carmelia Bake, PT, DPT 07/01/2016 Pager: [phone]

## 2016-07-01 NOTE — Progress Notes (Signed)
  Echocardiogram 2D Echocardiogram with Definity has been performed.  Stanley [name] M 07/01/2016, 12:01 PM

## 2016-07-01 NOTE — Consult Note (Signed)
[name] for Infectious Disease  Date of Admission:  06/29/2016  Date of Consult:  07/01/2016  Reason for Consult: Staph bacteremia, septic arthritis Referring Physician: Wynelle Cleveland  Impression/Recommendation Septic arthritis Ortho and plastics following.   Staph bacteremia with bovine valve (per pt) TTE pending. Suspect he may need TEE.  Await further info from his Cx Will repeat BCx in AM  DM2, uncontrolled A1C 7.6% Improvement will help with wound healing.   UCx  E aerogenes 40k (R- zosyn, ceftriaxone, S- imipenem, bactrim) His UA did not have leukocyte esterase or nitrates, only 0-5 WBC.  Would suggest this is a contaminant.  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  HPI: 82 yo M with DM2, CAD, AS with pericardial (bovine per pt) valve. He also has a hx of chronic wound/skin breakdown on R knee from a brace. He was started on keflex ~ 1 month ago. He was adm on 6-10 and 6-21 for R TKR dislocation/closed reduction.   He is adm on 7-11 with fever, confusion, urinary incontinence, and d/c from knee wound.   In ED his wbc was normal, his UA showed no nitrate or leukocyte esterase.  He was started on vanco/zosyn.  His BCx from admission are growing GPC in clusters 2/2. BCx repeated on 7-12 and 1/2 are growing GPC.   He has defervesced.   Past Medical History  Diagnosis Date  . Diabetes mellitus   . BPH (benign prostatic hyperplasia)   . Arthritis   . GERD (gastroesophageal reflux disease)   . Hypertension     pt denies  . Shortness of breath     uses cpap  . Peripheral vascular disease ([place]) 03-06-12    aortogram showed severe bilateral unreconstructible tibial artery disease  . Failed total knee, right ([place])   . H pylori ulcer     Gastric ulcer  . CAD (coronary artery disease)  s/p CABG     sees Dr. Loralie Champagne   . Aortic  stenosis     S/p pericardial valve  . Mobitz type 1 second degree atrioventricular block   . Shingles 05-12-12  . Lumbar spinal stenosis     sees Dr. Sherwood Gambler   . Anemia     sees Dr. Julien Nordmann     Past Surgical History  Procedure Laterality Date  . Aortic valve replacement    . Foot arthrotomy Right   . Tonsillectomy    . Cataract extraction, bilateral    . Humerus fracture surgery    . Replacement total knee Bilateral   . Toe amputation      rt 2nd  . Cardiac catheterization    . Amputation  01/19/2012    Procedure: AMPUTATION DIGIT;  Surgeon: Colin Rhein, MD;  Location: [facility];  Service: Orthopedics;  Laterality: Right;  right great toe amputation through MTP joint  . Rotator cuff repair Right   . Enteroscopy N/A 10/15/2014    Procedure: ENTEROSCOPY;  Surgeon: Gatha Mayer, MD;  Location: [facility];  Service: Endoscopy;  Laterality: N/A;  . Colonoscopy with propofol N/A 10/17/2014    Procedure: COLONOSCOPY WITH PROPOFOL;  Surgeon: Gatha Mayer, MD;  Location: [facility];  Service: Endoscopy;  Laterality: N/A;  . Abdominal aortagram N/A 02/25/2012    Procedure: ABDOMINAL AORTAGRAM;  Surgeon: Elam Dutch, MD;  Location: [facility] CATH LAB;  Service: Cardiovascular;  Laterality: N/A;  . Knee closed reduction N/A 05/29/2016    Procedure: CLOSED MANIPULATION KNEE;  Surgeon: Latanya Maudlin, MD;  Location: [facility];  Service: Orthopedics;  Laterality: N/A;  . Closed reduction patellar Right 06/09/2016    Procedure: CLOSED REDUCTION PATELLAR;  Surgeon: Gaynelle Arabian, MD;  Location: [facility];  Service: Orthopedics;  Laterality: Right;     Allergies  Allergen Reactions  . Ace Inhibitors Other (See Comments)    cough  . Succinylcholine Other (See Comments)    Prolonged sedation  . Morphine And Related Nausea And Vomiting    Medications:  Scheduled: . aspirin  81 mg Oral Daily  . calcium-vitamin D  1 tablet Oral BID  . carbidopa-levodopa  2 tablet Oral 4 times  per day  . diclofenac  75 mg Oral BID AC  . enoxaparin (LOVENOX) injection  40 mg Subcutaneous Q24H  . feeding supplement (ENSURE ENLIVE)  237 mL Oral TID BM  . finasteride  5 mg Oral Daily  . insulin aspart  0-15 Units Subcutaneous TID WC  . insulin aspart  0-5 Units Subcutaneous QHS  . INTEGRA PLUS  1 capsule Oral q morning - 10a  . linagliptin  5 mg Oral Daily  . mirabegron ER  50 mg Oral Daily  . multivitamin with minerals  1 tablet Oral Daily  . pantoprazole  40 mg Oral BID  . pioglitazone  45 mg Oral Daily  . rosuvastatin  10 mg Oral Daily  . saccharomyces boulardii  250 mg Oral BID  . tamsulosin  0.4 mg Oral QPC breakfast  . vancomycin  1,000 mg Intravenous Q12H  . vitamin A  10,000 Units Oral Daily  . zinc sulfate  220 mg Oral Daily    Abtx:  Anti-infectives    Start     Dose/Rate Route Frequency Ordered Stop   06/29/16 2200  vancomycin (VANCOCIN) IVPB 1000 mg/200 mL premix     1,000 mg 200 mL/hr over 60 Minutes Intravenous Every 12 hours 06/29/16 0939     06/29/16 1600  piperacillin-tazobactam (ZOSYN) IVPB 3.375 g  Status:  Discontinued     3.375 g 12.5 mL/hr over 240 Minutes Intravenous Every 8 hours 06/29/16 0940 07/01/16 1335   06/29/16 0915  piperacillin-tazobactam (ZOSYN) IVPB 3.375 g     3.375 g 100 mL/hr over 30 Minutes Intravenous  Once 06/29/16 0911 06/29/16 0950   06/29/16 0915  vancomycin (VANCOCIN) IVPB 1000 mg/200 mL premix     1,000 mg 200 mL/hr over 60 Minutes Intravenous  Once 06/29/16 0912 06/29/16 1124      Total days of antibiotics: 2 zosyn/vanco          Social History:  reports that he has never smoked. He has never used smokeless tobacco. He reports that he drinks about 4.2 oz of alcohol per week. He reports that he does not use illicit drugs.  Family History  Problem Relation Age of Onset  . Diabetes type II Mother   . Diabetes type II Father   . Colon cancer Neg Hx   . Esophageal cancer Neg Hx   . Rectal cancer Neg Hx   . Stomach  cancer Neg Hx   . Heart attack Neg Hx   . Stroke Neg Hx   . Hypertension Mother   . Hypertension Father   . Diabetes Father     General ROS: has been eating well. has mild decrease in sensation in LE, normal BM, no dysuria, +frequency, +incontenance.  Please see HPI. 12 point ROS o/w (-)   Blood pressure 127/56, pulse 95, temperature 98.4 F (36.9 C), temperature source Oral, resp. rate 20, height 5' 7"  (1.702 m), weight 78.9 kg (173 lb 15.1 oz), SpO2 97 %. General appearance: alert, cooperative and no distress Eyes: negative findings: conjunctivae and sclerae normal and pupils equal, round, reactive to light and accomodation Throat: normal findings: oropharynx pink & moist without lesions or evidence of thrush Neck: no adenopathy and supple, symmetrical, trachea midline Lungs: clear to auscultation bilaterally Heart: regular rate and rhythm Abdomen: normal findings: bowel sounds normal and soft, non-tender Extremities: edema none and R knee wrapped. non-tender.  Skin: multiple wounds, multiple areas dressed.    Results for orders placed or performed during the hospital encounter of 06/29/16 (from the past 48 hour(s))  Glucose, capillary     Status: Abnormal   Collection Time: 06/29/16  5:35 PM  Result Value Ref Range   Glucose-Capillary 236 (H) 65 - 99 mg/dL  Glucose, capillary     Status: Abnormal   Collection Time: 06/29/16  8:38 PM  Result Value Ref Range   Glucose-Capillary 260 (H) 65 - 99 mg/dL  Basic metabolic panel     Status: Abnormal   Collection Time: 06/30/16  3:57 AM  Result Value Ref Range   Sodium 133 (L) 135 - 145 mmol/L   Potassium 4.1 3.5 - 5.1 mmol/L   Chloride 103 101 - 111 mmol/L   CO2 21 (L) 22 - 32 mmol/L   Glucose, Bld 243 (H) 65 - 99 mg/dL   BUN 24 (H) 6 - 20 mg/dL   Creatinine, Ser 0.74 0.61 - 1.24 mg/dL   Calcium 8.3 (L) 8.9 - 10.3 mg/dL   GFR calc non Af Amer >60 >60 mL/min   GFR calc Af Amer >60 >60 mL/min    Comment: (NOTE) The eGFR has  been calculated using the CKD EPI equation. This calculation has not been validated in all clinical situations. eGFR's persistently <60 mL/min signify possible Chronic Kidney Disease.    Anion gap 9 5 - 15  CBC     Status: Abnormal   Collection Time: 06/30/16  3:57 AM  Result Value Ref Range   WBC 10.2 4.0 - 10.5 K/uL   RBC 3.06 (L) 4.22 - 5.81 MIL/uL   Hemoglobin 9.8 (L) 13.0 - 17.0 g/dL   HCT 29.2 (L) 39.0 - 52.0 %   MCV 95.4 78.0 - 100.0 fL   MCH 32.0 26.0 - 34.0 pg   MCHC 33.6 30.0 - 36.0 g/dL   RDW 14.6 11.5 - 15.5 %   Platelets 177 150 - 400 K/uL  Glucose, capillary     Status: Abnormal   Collection Time: 06/30/16  9:04 AM  Result Value Ref Range   Glucose-Capillary 231 (H) 65 - 99 mg/dL  Glucose, capillary     Status: Abnormal   Collection Time: 06/30/16 11:52 AM  Result Value Ref Range   Glucose-Capillary 330 (H) 65 - 99 mg/dL  Culture, blood (Routine X 2) w Reflex to ID Panel     Status: None (Preliminary result)   Collection Time: 06/30/16 12:41 PM  Result Value Ref Range   Specimen Description BLOOD LEFT HAND    Special Requests IN PEDIATRIC BOTTLE 2CC    Culture  Setup Time      GRAM POSITIVE COCCI IN CLUSTERS AEROBIC BOTTLE ONLY CRITICAL RESULT CALLED TO, READ BACK BY AND VERIFIED WITH: T. [place] [facility] AT 1209 07/01/16 BY D.  VANHOOK Performed at [place] IN CLUSTERS    Report Status PENDING   Glucose, capillary     Status: Abnormal   Collection Time: 06/30/16  4:44 PM  Result Value Ref Range   Glucose-Capillary 277 (H) 65 - 99 mg/dL  Glucose, capillary     Status: Abnormal   Collection Time: 06/30/16  9:51 PM  Result Value Ref Range   Glucose-Capillary 290 (H) 65 - 99 mg/dL  Prealbumin     Status: Abnormal   Collection Time: 07/01/16  4:45 AM  Result Value Ref Range   Prealbumin 11.0 (L) 18 - 38 mg/dL    Comment: Performed at [facility] metabolic panel     Status: Abnormal   Collection Time:  07/01/16  4:45 AM  Result Value Ref Range   Sodium 134 (L) 135 - 145 mmol/L   Potassium 4.4 3.5 - 5.1 mmol/L   Chloride 105 101 - 111 mmol/L   CO2 23 22 - 32 mmol/L   Glucose, Bld 245 (H) 65 - 99 mg/dL   BUN 21 (H) 6 - 20 mg/dL   Creatinine, Ser 0.72 0.61 - 1.24 mg/dL   Calcium 8.2 (L) 8.9 - 10.3 mg/dL   GFR calc non Af Amer >60 >60 mL/min   GFR calc Af Amer >60 >60 mL/min    Comment: (NOTE) The eGFR has been calculated using the CKD EPI equation. This calculation has not been validated in all clinical situations. eGFR's persistently <60 mL/min signify possible Chronic Kidney Disease.    Anion gap 6 5 - 15  Glucose, capillary     Status: Abnormal   Collection Time: 07/01/16  7:39 AM  Result Value Ref Range   Glucose-Capillary 231 (H) 65 - 99 mg/dL  Glucose, capillary     Status: Abnormal   Collection Time: 07/01/16 12:36 PM  Result Value Ref Range   Glucose-Capillary 316 (H) 65 - 99 mg/dL      Component Value Date/Time   SDES BLOOD LEFT HAND 06/30/2016 1241   SPECREQUEST IN PEDIATRIC BOTTLE 2CC 06/30/2016 1241   CULT GRAM POSITIVE COCCI IN CLUSTERS 06/30/2016 1241   REPTSTATUS PENDING 06/30/2016 1241   No results found. Recent Results (from the past 240 hour(s))  Culture, blood (Routine X 2)     Status: None (Preliminary result)   Collection Time: 06/29/16  8:45 AM  Result Value Ref Range Status   Specimen Description BLOOD RIGHT ANTECUBITAL  Final   Special Requests BOTTLES DRAWN AEROBIC AND ANAEROBIC 5ML  Final   Culture  Setup Time   Final    GRAM POSITIVE COCCI IN CLUSTERS IN BOTH AEROBIC AND ANAEROBIC BOTTLES CRITICAL RESULT CALLED TO, READ BACK BY AND VERIFIED WITH: MLT T.MULLINS [id number] @0710  MLM CRITICAL RESULT CALLED TO, READ BACK BY AND VERIFIED WITH: SEAY,K. RN AT 1324 06/30/16 MULLINS,T Performed at [place]  Final   Report Status PENDING  Incomplete  Blood Culture ID Panel (Reflexed)     Status: None    Collection Time: 06/29/16  8:45 AM  Result Value Ref Range Status   Enterococcus species NOT DETECTED NOT DETECTED Final   Vancomycin resistance NOT DETECTED NOT DETECTED Final   Listeria monocytogenes NOT DETECTED NOT DETECTED Final   Staphylococcus species NOT DETECTED NOT DETECTED Final   Staphylococcus aureus NOT DETECTED NOT DETECTED Final   Methicillin resistance NOT DETECTED NOT DETECTED Final  Streptococcus species NOT DETECTED NOT DETECTED Final   Streptococcus agalactiae NOT DETECTED NOT DETECTED Final   Streptococcus pneumoniae NOT DETECTED NOT DETECTED Final   Streptococcus pyogenes NOT DETECTED NOT DETECTED Final   Acinetobacter baumannii NOT DETECTED NOT DETECTED Final   Enterobacteriaceae species NOT DETECTED NOT DETECTED Final   Enterobacter cloacae complex NOT DETECTED NOT DETECTED Final   Escherichia coli NOT DETECTED NOT DETECTED Final   Klebsiella oxytoca NOT DETECTED NOT DETECTED Final   Klebsiella pneumoniae NOT DETECTED NOT DETECTED Final   Proteus species NOT DETECTED NOT DETECTED Final   Serratia marcescens NOT DETECTED NOT DETECTED Final   Carbapenem resistance NOT DETECTED NOT DETECTED Final   Haemophilus influenzae NOT DETECTED NOT DETECTED Final   Neisseria meningitidis NOT DETECTED NOT DETECTED Final   Pseudomonas aeruginosa NOT DETECTED NOT DETECTED Final   Candida albicans NOT DETECTED NOT DETECTED Final   Candida glabrata NOT DETECTED NOT DETECTED Final   Candida krusei NOT DETECTED NOT DETECTED Final   Candida parapsilosis NOT DETECTED NOT DETECTED Final   Candida tropicalis NOT DETECTED NOT DETECTED Final    Comment: Performed at [facility]  Culture, blood (Routine X 2)     Status: None (Preliminary result)   Collection Time: 06/29/16  8:47 AM  Result Value Ref Range Status   Specimen Description BLOOD LEFT HAND  Final   Special Requests BOTTLES DRAWN AEROBIC AND ANAEROBIC 5CC  Final   Culture  Setup Time   Final    GRAM POSITIVE COCCI  IN CLUSTERS IN BOTH AEROBIC AND ANAEROBIC BOTTLES CRITICAL VALUE NOTED.  VALUE IS CONSISTENT WITH PREVIOUSLY REPORTED AND CALLED VALUE. Performed at [place]  Final   Report Status PENDING  Incomplete  Wound or Superficial Culture     Status: None (Preliminary result)   Collection Time: 06/29/16  8:47 AM  Result Value Ref Range Status   Specimen Description WOUND RIGHT KNEE  Final   Special Requests Normal  Final   Gram Stain   Final    FEW WBC PRESENT, PREDOMINANTLY PMN FEW GRAM POSITIVE COCCI IN PAIRS    Culture   Final    CULTURE REINCUBATED FOR BETTER GROWTH Performed at [facility]    Report Status PENDING  Incomplete  Urine C&S     Status: Abnormal   Collection Time: 06/29/16  9:07 AM  Result Value Ref Range Status   Specimen Description URINE, CLEAN CATCH  Final   Special Requests Normal  Final   Culture 40,000 COLONIES/mL ENTEROBACTER AEROGENES (A)  Final   Report Status 07/01/2016 FINAL  Final   Organism ID, Bacteria ENTEROBACTER AEROGENES (A)  Final      Susceptibility   Enterobacter aerogenes - MIC*    CEFAZOLIN >=64 RESISTANT Resistant     CEFTRIAXONE >=64 RESISTANT Resistant     CIPROFLOXACIN <=0.25 SENSITIVE Sensitive     GENTAMICIN <=1 SENSITIVE Sensitive     IMIPENEM <=0.25 SENSITIVE Sensitive     NITROFURANTOIN 64 INTERMEDIATE Intermediate     TRIMETH/SULFA <=20 SENSITIVE Sensitive     PIP/TAZO >=128 RESISTANT Resistant     * 40,000 COLONIES/mL ENTEROBACTER AEROGENES  MRSA PCR Screening     Status: None   Collection Time: 06/29/16  1:27 PM  Result Value Ref Range Status   MRSA by PCR NEGATIVE NEGATIVE Final    Comment:        The GeneXpert MRSA Assay (FDA approved for NASAL specimens only),  is one component of a comprehensive MRSA colonization surveillance program. It is not intended to diagnose MRSA infection nor to guide or monitor treatment for MRSA infections.   Culture, blood (Routine X 2) w  Reflex to ID Panel     Status: None (Preliminary result)   Collection Time: 06/30/16 12:41 PM  Result Value Ref Range Status   Specimen Description BLOOD LEFT HAND  Final   Special Requests IN PEDIATRIC BOTTLE 2CC  Final   Culture  Setup Time   Final    GRAM POSITIVE COCCI IN CLUSTERS AEROBIC BOTTLE ONLY CRITICAL RESULT CALLED TO, READ BACK BY AND VERIFIED WITH: T. PICKER[facility] AT 1209 07/01/16 BY Rush Landmark Performed at [place] COCCI IN CLUSTERS  Final   Report Status PENDING  Incomplete      07/01/2016, 2:53 PM     LOS: 2 days    Records and images were personally reviewed where available.

## 2016-07-01 NOTE — Consult Note (Signed)
WOC wound consult note Reason for Consult: Right great toe chronic wound.  Previously treated at the outpatient wound care center  Many years ago and this wound was the "hold out". Stable, unchanged for several years. Wound type: Neuropathic, chronic non-healing.  Stable. Also noted is a horny callous on the right lateral foot that Dr. [name] covers with moleskin at home and that his wife helps him to manage. This is also stable and dry. Pressure Ulcer POA: No Measurement: Right great toe area:  0.8cm x 1.6cm chronic wound with pale, smooth wound bed. No exudate, no erythema, no induration or warmth. Right lateral foot horny callous.  Dry and stable. Wound bed:As described above. Drainage (amount, consistency, odor) Scant serous from right great toe.  No odor. Periwound:intact, dry. Dressing procedure/placement/frequency: After an extended visit with Dr. [name], I will follow his suggestions for topical care and pass along those in the form of Orders for the Nursing staff. He would prefer to use triple antibiotic ointment to the chronic wound on the right toe area and has agreed to bacitracin use while in house.  The right lateral callous is typically covered with moleskin at home; he has agreed to having [place] use a dry 2x2 in house.  [facility] nursing team will not follow, but will remain available to this patient, the nursing and medical teams.  Please re-consult if needed. Thanks, Maudie Flakes, MSN, RN, [place], Arther Abbott  Pager# [phone]

## 2016-07-01 NOTE — Progress Notes (Signed)
Dressing changed to right knee as ordered at 1740- Two new wounds present since yesterday. Open wound present to outer right knee measuring 2.5 cm x 1.5 cm. Wound is reddened and bleeding. Pt also has a large fluid filled blister to base of mid-knee under patella. Fluid in blister appears to be yellow in appearance. Wound was dressing in wet to dry dressing with kerlix as ordered. MD Rizwan made aware- will pass to next shift to make Plastics aware in AM.

## 2016-07-01 NOTE — Consult Note (Signed)
   [facility] CM Inpatient Consult   07/01/2016  [name] MD 05-Oct-1925 [id number]   Spoke with Dr. [name] at bedside to discuss [facility] services. He reports he is not sure what his discharge plan will be at this point and that he thinks he may be transferred to [facility] at some point. Explained [facility] Care Management program. Dr. [name] states he would like for writer to come back at a later time if his disposition plans are for home at discharge. Left [facility] Care Management brochure at bedside. Will make inpatient RNCM aware of bedside visit.   Marthenia Rolling, MSN-Ed, RN,BSN [facility] Liaison [phone]

## 2016-07-02 ENCOUNTER — Other Ambulatory Visit: Payer: Self-pay | Admitting: Plastic Surgery

## 2016-07-02 ENCOUNTER — Encounter (HOSPITAL_COMMUNITY): Payer: Self-pay | Admitting: Plastic Surgery

## 2016-07-02 DIAGNOSIS — Z952 Presence of prosthetic heart valve: Secondary | ICD-10-CM

## 2016-07-02 DIAGNOSIS — B957 Other staphylococcus as the cause of diseases classified elsewhere: Secondary | ICD-10-CM | POA: Insufficient documentation

## 2016-07-02 DIAGNOSIS — R509 Fever, unspecified: Secondary | ICD-10-CM | POA: Insufficient documentation

## 2016-07-02 DIAGNOSIS — R7881 Bacteremia: Secondary | ICD-10-CM | POA: Insufficient documentation

## 2016-07-02 DIAGNOSIS — S81801A Unspecified open wound, right lower leg, initial encounter: Secondary | ICD-10-CM

## 2016-07-02 DIAGNOSIS — E1165 Type 2 diabetes mellitus with hyperglycemia: Secondary | ICD-10-CM

## 2016-07-02 LAB — BASIC METABOLIC PANEL
ANION GAP: 6 (ref 5–15)
BUN: 31 mg/dL — ABNORMAL HIGH (ref 6–20)
CALCIUM: 8.3 mg/dL — AB (ref 8.9–10.3)
CO2: 24 mmol/L (ref 22–32)
CREATININE: 0.78 mg/dL (ref 0.61–1.24)
Chloride: 103 mmol/L (ref 101–111)
Glucose, Bld: 276 mg/dL — ABNORMAL HIGH (ref 65–99)
Potassium: 4.5 mmol/L (ref 3.5–5.1)
SODIUM: 133 mmol/L — AB (ref 135–145)

## 2016-07-02 LAB — CULTURE, BLOOD (ROUTINE X 2)

## 2016-07-02 LAB — GLUCOSE, CAPILLARY
GLUCOSE-CAPILLARY: 242 mg/dL — AB (ref 65–99)
GLUCOSE-CAPILLARY: 300 mg/dL — AB (ref 65–99)
GLUCOSE-CAPILLARY: 323 mg/dL — AB (ref 65–99)
Glucose-Capillary: 127 mg/dL — ABNORMAL HIGH (ref 65–99)

## 2016-07-02 LAB — CBC
HCT: 26.5 % — ABNORMAL LOW (ref 39.0–52.0)
HEMOGLOBIN: 8.9 g/dL — AB (ref 13.0–17.0)
MCH: 31.8 pg (ref 26.0–34.0)
MCHC: 33.6 g/dL (ref 30.0–36.0)
MCV: 94.6 fL (ref 78.0–100.0)
PLATELETS: 178 10*3/uL (ref 150–400)
RBC: 2.8 MIL/uL — AB (ref 4.22–5.81)
RDW: 14.5 % (ref 11.5–15.5)
WBC: 9.3 10*3/uL (ref 4.0–10.5)

## 2016-07-02 MED ORDER — INSULIN GLARGINE 100 UNIT/ML ~~LOC~~ SOLN
20.0000 [IU] | Freq: Every day | SUBCUTANEOUS | Status: DC
  Administered 2016-07-03: 20 [IU] via SUBCUTANEOUS
  Filled 2016-07-02 (×2): qty 0.2

## 2016-07-02 MED ORDER — INSULIN GLARGINE 100 UNIT/ML ~~LOC~~ SOLN
10.0000 [IU] | Freq: Every day | SUBCUTANEOUS | Status: DC
  Administered 2016-07-02: 10 [IU] via SUBCUTANEOUS
  Filled 2016-07-02: qty 0.1

## 2016-07-02 MED ORDER — ZOLPIDEM TARTRATE 10 MG PO TABS
10.0000 mg | ORAL_TABLET | Freq: Every evening | ORAL | Status: DC | PRN
  Administered 2016-07-02: 10 mg via ORAL
  Filled 2016-07-02: qty 1

## 2016-07-02 MED ORDER — INSULIN ASPART 100 UNIT/ML ~~LOC~~ SOLN
16.0000 [IU] | Freq: Once | SUBCUTANEOUS | Status: AC
  Administered 2016-07-02: 16 [IU] via SUBCUTANEOUS

## 2016-07-02 NOTE — Clinical Social Work Note (Signed)
Clinical Social Work Assessment  Patient Details  Name: Stanley ZAHORIK MD MRN: [id number] Date of Birth: 04/21/1925  Date of referral:  07/02/16               Reason for consult:  Discharge Planning                Permission sought to share information with:  Facility Art therapist granted to share information::  Yes, Verbal Permission Granted  Name::        Agency::     Relationship::     Contact Information:     Housing/Transportation Living arrangements for the past 2 months:  Single Family Home Source of Information:  Patient Patient Interpreter Needed:  None Criminal Activity/Legal Involvement Pertinent to Current Situation/Hospitalization:    Significant Relationships:  Spouse Lives with:  Spouse Do you feel safe going back to the place where you live?  No Need for family participation in patient care:  Yes (Comment)  Care giving concerns:  CSW received consult to speak with pt regarding discharge plans.    Social Worker assessment / plan:  CSW spoke with pt at bedside regarding his plans for discharge. Pt wants to receive rehab before returning home and would prefer [facility]. Pt did not have many questions and stated he had friends/family members that have been to [facility] and they all had good things to say. CSW has faxed out his information to the surrounding SNF's and will continue to update pt.  Employment status:  Retired Nurse, adult PT Recommendations:  [place] / Referral to community resources:  [place]  Patient/Family's Response to care:  Pt was appreciative of CSW.  Patient/Family's Understanding of and Emotional Response to Diagnosis, Current Treatment, and Prognosis:  Pt did not have many questions regarding SNF options and knew that he would prefer [facility]. Pt is from home with spouse but does not think he can go home without rehab first. CSW will  continue to update.   Emotional Assessment Appearance:  Appears stated age Attitude/Demeanor/Rapport:    Affect (typically observed):  Accepting, Happy, Pleasant Orientation:  Oriented to Self, Oriented to Place, Oriented to  Time, Oriented to Situation Alcohol / Substance use:    Psych involvement (Current and /or in the community):  No (Comment)  Discharge Needs  Concerns to be addressed:  Discharge Planning Concerns Readmission within the last 30 days:  Yes Current discharge risk:  None Barriers to Discharge:  No Barriers Identified   [place]on Anna, LCSW 07/02/2016, 1:33 PM

## 2016-07-02 NOTE — Progress Notes (Signed)
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   Abtx:  Anti-infectives    Start     Dose/Rate Route Frequency Ordered Stop   06/29/16 2200  vancomycin (VANCOCIN) IVPB 1000 mg/200 mL premix     1,000 mg 200 mL/hr over 60 Minutes Intravenous Every 12 hours 06/29/16 0939     06/29/16 1600  piperacillin-tazobactam (ZOSYN) IVPB 3.375 g  Status:  Discontinued     3.375 g 12.5 mL/hr over 240 Minutes Intravenous Every 8 hours 06/29/16 0940 07/01/16 1335   06/29/16 0915  piperacillin-tazobactam (ZOSYN) IVPB 3.375 g     3.375 g 100 mL/hr over 30 Minutes Intravenous  Once 06/29/16 0911 06/29/16 0950   06/29/16 0915  vancomycin (VANCOCIN) IVPB 1000 mg/200 mL premix     1,000 mg 200 mL/hr over 60 Minutes Intravenous  Once 06/29/16 0912 06/29/16 1124      Medications:  Scheduled: . aspirin  81 mg Oral Daily  . calcium-vitamin D  1 tablet Oral BID  . carbidopa-levodopa  2 tablet Oral 4 times per day  . diclofenac  75 mg Oral BID AC  . enoxaparin (LOVENOX) injection  40 mg Subcutaneous Q24H  . feeding supplement (ENSURE ENLIVE)  237 mL Oral TID BM  . finasteride  5 mg Oral Daily  . insulin aspart  0-15 Units Subcutaneous TID WC  . insulin aspart  0-5 Units Subcutaneous QHS  . insulin glargine  10 Units Subcutaneous Daily  . INTEGRA PLUS  1 capsule Oral q morning - 10a  . linagliptin  5 mg Oral Daily  . mirabegron ER  50 mg Oral Daily  . multivitamin with minerals  1 tablet Oral Daily  . pantoprazole  40 mg Oral BID  . pioglitazone  45 mg Oral  Daily  . rosuvastatin  10 mg Oral Daily  . saccharomyces boulardii  250 mg Oral BID  . tamsulosin  0.4 mg Oral QPC breakfast  . vancomycin  1,000 mg Intravenous Q12H  . vitamin A  10,000 Units Oral Daily  . zinc sulfate  220 mg Oral Daily    Objective: Vital signs in last 24 hours: Temp:  [97.9 F (36.6 C)-98.6 F (37 C)] 97.9 F (36.6 C) (07/14 0504) Pulse Rate:  [50-90] 90 (07/14 0504) Resp:  [20] 20 (07/14 0504) BP: (114-139)/(49-75) 139/75 mmHg (07/14 0504) SpO2:  [95 %-97 %] 97 % (07/14 0504)   General appearance: alert, cooperative, fatigued and no distress Resp: clear to auscultation bilaterally Cardio: irregularly irregular rhythm GI: normal findings: bowel sounds normal and soft, non-tender Extremities: RLE dressed, clean  Lab Results  Recent Labs  06/30/16 0357 07/01/16 0445 07/02/16 0440  WBC 10.2  --  9.3  HGB 9.8*  --  8.9*  HCT 29.2*  --  26.5*  NA 133* 134* 133*  K 4.1 4.4 4.5  CL 103 105 103  CO2 21* 23 24  BUN 24* 21* 31*  CREATININE 0.74 0.72 0.78   Liver Panel No results for input(s): PROT, ALBUMIN, AST, ALT, ALKPHOS,  BILITOT, BILIDIR, IBILI in the last 72 hours. Sedimentation Rate No results for input(s): ESRSEDRATE in the last 72 hours. C-Reactive Protein No results for input(s): CRP in the last 72 hours.  Microbiology: Recent Results (from the past 240 hour(s))  Culture, blood (Routine X 2)     Status: Abnormal   Collection Time: 06/29/16  8:45 AM  Result Value Ref Range Status   Specimen Description BLOOD RIGHT ANTECUBITAL  Final   Special Requests BOTTLES DRAWN AEROBIC AND ANAEROBIC 5ML  Final   Culture  Setup Time   Final    GRAM POSITIVE COCCI IN CLUSTERS IN BOTH AEROBIC AND ANAEROBIC BOTTLES CRITICAL RESULT CALLED TO, READ BACK BY AND VERIFIED WITH: MLT T.MULLINS [id number] @0710  MLM CRITICAL RESULT CALLED TO, READ BACK BY AND VERIFIED WITH: SEAY,K. RN AT [id number] 06/30/16 MULLINS,T Performed at [facility]    Culture  STAPHYLOCOCCUS SPECIES (COAGULASE NEGATIVE) (A)  Final   Report Status 07/02/2016 FINAL  Final   Organism ID, Bacteria STAPHYLOCOCCUS SPECIES (COAGULASE NEGATIVE)  Final      Susceptibility   Staphylococcus species (coagulase negative) - MIC*    CIPROFLOXACIN 2 INTERMEDIATE Intermediate     ERYTHROMYCIN <=0.25 SENSITIVE Sensitive     GENTAMICIN 1 SENSITIVE Sensitive     OXACILLIN >=4 RESISTANT Resistant     TETRACYCLINE <=1 SENSITIVE Sensitive     VANCOMYCIN <=0.5 SENSITIVE Sensitive     TRIMETH/SULFA <=10 SENSITIVE Sensitive     CLINDAMYCIN <=0.25 SENSITIVE Sensitive     RIFAMPIN <=0.5 SENSITIVE Sensitive     Inducible Clindamycin NEGATIVE Sensitive     * STAPHYLOCOCCUS SPECIES (COAGULASE NEGATIVE)  Blood Culture ID Panel (Reflexed)     Status: None   Collection Time: 06/29/16  8:45 AM  Result Value Ref Range Status   Enterococcus species NOT DETECTED NOT DETECTED Final   Vancomycin resistance NOT DETECTED NOT DETECTED Final   Listeria monocytogenes NOT DETECTED NOT DETECTED Final   Staphylococcus species NOT DETECTED NOT DETECTED Final   Staphylococcus aureus NOT DETECTED NOT DETECTED Final   Methicillin resistance NOT DETECTED NOT DETECTED Final   Streptococcus species NOT DETECTED NOT DETECTED Final   Streptococcus agalactiae NOT DETECTED NOT DETECTED Final   Streptococcus pneumoniae NOT DETECTED NOT DETECTED Final   Streptococcus pyogenes NOT DETECTED NOT DETECTED Final   Acinetobacter baumannii NOT DETECTED NOT DETECTED Final   Enterobacteriaceae species NOT DETECTED NOT DETECTED Final   Enterobacter cloacae complex NOT DETECTED NOT DETECTED Final   Escherichia coli NOT DETECTED NOT DETECTED Final   Klebsiella oxytoca NOT DETECTED NOT DETECTED Final   Klebsiella pneumoniae NOT DETECTED NOT DETECTED Final   Proteus species NOT DETECTED NOT DETECTED Final   Serratia marcescens NOT DETECTED NOT DETECTED Final   Carbapenem resistance NOT DETECTED NOT DETECTED Final    Haemophilus influenzae NOT DETECTED NOT DETECTED Final   Neisseria meningitidis NOT DETECTED NOT DETECTED Final   Pseudomonas aeruginosa NOT DETECTED NOT DETECTED Final   Candida albicans NOT DETECTED NOT DETECTED Final   Candida glabrata NOT DETECTED NOT DETECTED Final   Candida krusei NOT DETECTED NOT DETECTED Final   Candida parapsilosis NOT DETECTED NOT DETECTED Final   Candida tropicalis NOT DETECTED NOT DETECTED Final    Comment: Performed at [facility]  Culture, blood (Routine X 2)     Status: Abnormal (Preliminary result)   Collection Time: 06/29/16  8:47 AM  Result Value Ref Range Status   Specimen Description BLOOD LEFT HAND  Final   Special  Requests BOTTLES DRAWN AEROBIC AND ANAEROBIC 5CC  Final   Culture  Setup Time   Final    GRAM POSITIVE COCCI IN CLUSTERS IN BOTH AEROBIC AND ANAEROBIC BOTTLES CRITICAL VALUE NOTED.  VALUE IS CONSISTENT WITH PREVIOUSLY REPORTED AND CALLED VALUE. Performed at [facility] (COAGULASE NEGATIVE) (A)  Final   Report Status PENDING  Incomplete  Wound or Superficial Culture     Status: None (Preliminary result)   Collection Time: 06/29/16  8:47 AM  Result Value Ref Range Status   Specimen Description WOUND RIGHT KNEE  Final   Special Requests Normal  Final   Gram Stain   Final    FEW WBC PRESENT, PREDOMINANTLY PMN FEW GRAM POSITIVE COCCI IN PAIRS    Culture   Final    CULTURE REINCUBATED FOR BETTER GROWTH Performed at [facility]    Report Status PENDING  Incomplete  Urine C&S     Status: Abnormal   Collection Time: 06/29/16  9:07 AM  Result Value Ref Range Status   Specimen Description URINE, CLEAN CATCH  Final   Special Requests Normal  Final   Culture 40,000 COLONIES/mL ENTEROBACTER AEROGENES (A)  Final   Report Status 07/01/2016 FINAL  Final   Organism ID, Bacteria ENTEROBACTER AEROGENES (A)  Final      Susceptibility   Enterobacter aerogenes - MIC*    CEFAZOLIN >=64  RESISTANT Resistant     CEFTRIAXONE >=64 RESISTANT Resistant     CIPROFLOXACIN <=0.25 SENSITIVE Sensitive     GENTAMICIN <=1 SENSITIVE Sensitive     IMIPENEM <=0.25 SENSITIVE Sensitive     NITROFURANTOIN 64 INTERMEDIATE Intermediate     TRIMETH/SULFA <=20 SENSITIVE Sensitive     PIP/TAZO >=128 RESISTANT Resistant     * 40,000 COLONIES/mL ENTEROBACTER AEROGENES  MRSA PCR Screening     Status: None   Collection Time: 06/29/16  1:27 PM  Result Value Ref Range Status   MRSA by PCR NEGATIVE NEGATIVE Final    Comment:        The GeneXpert MRSA Assay (FDA approved for NASAL specimens only), is one component of a comprehensive MRSA colonization surveillance program. It is not intended to diagnose MRSA infection nor to guide or monitor treatment for MRSA infections.   Culture, blood (Routine X 2) w Reflex to ID Panel     Status: None (Preliminary result)   Collection Time: 06/30/16 12:37 PM  Result Value Ref Range Status   Specimen Description BLOOD LEFT ARM  Final   Special Requests BOTTLES DRAWN AEROBIC ONLY 5CC  Final   Culture   Final    NO GROWTH 1 DAY Performed at [facility]    Report Status PENDING  Incomplete  Culture, blood (Routine X 2) w Reflex to ID Panel     Status: None (Preliminary result)   Collection Time: 06/30/16 12:41 PM  Result Value Ref Range Status   Specimen Description BLOOD LEFT HAND  Final   Special Requests IN PEDIATRIC BOTTLE 2CC  Final   Culture  Setup Time   Final    GRAM POSITIVE COCCI IN CLUSTERS AEROBIC BOTTLE ONLY CRITICAL RESULT CALLED TO, READ BACK BY AND VERIFIED WITH: T. PICKERING [facility] AT 1209 07/01/16 BY D. VANHOOK    Culture   Final    GRAM POSITIVE COCCI IN CLUSTERS CULTURE REINCUBATED FOR BETTER GROWTH Performed at [facility]    Report Status PENDING  Incomplete    Studies/Results: No results  found.   Assessment/Plan: Septic arthritis To OR on 7-17  Staph/MRSE bacteremia (7-11 and 7-12)  Repeat BCx pending  from this AM If repeat negative, can place PIC (7-17)  Prosthetic Ao valve Suggest TEE  DM2 uncontrolled FSG remain elevated.   Dr Tommy Medal available as needed over weekend.   Total days of antibiotics: 3 vanco         Bobby Rumpf Infectious Diseases (pager) [phone] [email or web address] 07/02/2016, 11:00 AM  LOS: 3 days

## 2016-07-02 NOTE — Clinical Social Work Placement (Signed)
   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  Yes   Patient/family informed of their freedom to choose among providers that offer the needed level of care, that participate in Medicare, Medicaid or managed care program needed by the patient, have an available bed and are willing to accept the patient.  Yes   Patient/family informed of [facility]'s ownership interest in [facility] and [facility], as well as of the fact that they are under no obligation to receive care at these facilities.  PASRR submitted to EDS on 07/02/16     PASRR number received on 07/02/16     Existing PASRR number confirmed on       FL2 transmitted to all facilities in geographic area requested by pt/family on 07/02/16     FL2 transmitted to all facilities within larger geographic area on       Patient informed that his/her managed care company has contracts with or will negotiate with certain facilities, including the following:            Patient/family informed of bed offers received.  Patient chooses bed at       Physician recommends and patient chooses bed at      Patient to be transferred to   on  .  Patient to be transferred to facility by       Patient family notified on   of transfer.  Name of family member notified:        PHYSICIAN       Additional Comment:    _______________________________________________ Weston Anna, LCSW 07/02/2016, 1:44 PM

## 2016-07-02 NOTE — Progress Notes (Addendum)
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 He has been on keflex for the wound. He presents with confusion, fever, increased drainage and incontinence. He has been having frequent falls.   Subjective: Appetite improving. No nausea, vomiting, diarrhea. No significant pain in knee.   Assessment & Plan:   Principal Problem:   Sepsis - fever 101, confusion, knee wound and blood cultures growing coag neg staph- repeat cultures on 7/12 also postiive 1/ 2 sets - third set of blood cultures ordered today- if negative, she will need PICC - ECHO > no vegetations noted- recommended TEE- I will contact cardiology - ID managing antibiotics - spoke with plastic surgery today- patient will go to [place] for I and D on Monday  Active Problems:  Right knee dislocation - s/p closed reduction x 2  - superficial wound infection- ortho and plastic surgery eval- see above   Diabetes mellitus without complication  - sugars elevated despite poor PO intake- recently received epidural steroid about 1 month ago - resumed Actos and Januvia - increased SSI- added Lantus - will increase Lantus- he is drinking Ensure TID between meals which he prefers over Glucerna and this is exacerbating his sugars further -will be giving 16 U Novolog now at dinner - A1c 7.6 in 3/17- no need to recheck at this admission-  - diabetic diet  Obstructive sleep apnea CPAP    CAD (coronary artery disease) of artery bypass graft - Aspirin  Anemia - Hb drifting down -  h/o Iron deficiency anemia followed by Dr Julien Nordmann - check anemia panel in AM    S/P AVR    Chronic systolic CHF (congestive heart failure) - stopped IVF  - following I and O    Parkinson's disease - Sinemet  BPH/ bladder issues? - on Proscar, Flomax and Myrbetriq   DVT prophylaxis: Lovenox Code Status: full code Family Communication:  Disposition Plan:  Consultants:   ID, ortho, plastic surgery Procedures:  2 D ECHO 07/01/16 Study Conclusions  - Left ventricle: The cavity size was normal. Wall thickness was  increased in a pattern of moderate LVH. Systolic function was  normal. The estimated ejection fraction was in the range of 50%  to 55%. Although no diagnostic regional wall motion abnormality  was identified, this possibility cannot be completely excluded on  the basis of this study. - Aortic valve: A bioprosthesis was present and functioning  normally. - Aortic root: The aortic root was mildly dilated. - Mitral valve: There was mild regurgitation. - Left atrium: The atrium was moderately dilated. - Right ventricle: The cavity size was dilated. Wall thickness was  normal. Systolic function was mildly reduced. - Right atrium: The atrium was moderately dilated. - Tricuspid valve: There was mild-moderate regurgitation. - Pulmonary arteries: Systolic pressure was moderately increased.  PA peak pressure: 53 mm Hg (S). Antimicrobials:  Anti-infectives    Start     Dose/Rate Route Frequency Ordered Stop   06/29/16 2200  vancomycin (VANCOCIN) IVPB 1000 mg/200 mL premix     1,000 mg 200 mL/hr over 60 Minutes Intravenous Every 12 hours 06/29/16 0939     06/29/16 1600  piperacillin-tazobactam (ZOSYN) IVPB 3.375 g  Status:  Discontinued  3.375 g 12.5 mL/hr over 240 Minutes Intravenous Every 8 hours 06/29/16 0940 07/01/16 1335   06/29/16 0915  piperacillin-tazobactam (ZOSYN) IVPB 3.375 g     3.375 g 100 mL/hr over 30 Minutes Intravenous  Once 06/29/16 0911 06/29/16 0950   06/29/16 0915  vancomycin (VANCOCIN) IVPB 1000 mg/200 mL premix     1,000 mg 200 mL/hr over 60 Minutes Intravenous  Once 06/29/16 0912 06/29/16 1124        Objective: Filed Vitals:   07/01/16 1550 07/01/16 2131 07/02/16 0504 07/02/16 1411  BP: 114/49 121/53 139/75 119/53  Pulse: 58 50 90 78  Temp: 98.5 F (36.9 C) 98.6 F (37 C) 97.9 F (36.6 C) 97.6 F (36.4 C)  TempSrc: Oral Oral Oral Axillary  Resp: 20 20 20 20   Height:      Weight:      SpO2: 95% 95% 97% 98%    Intake/Output Summary (Last 24 hours) at 07/02/16 1705 Last data filed at 07/02/16 1323  Gross per 24 hour  Intake    890 ml  Output    901 ml  Net    -11 ml   Filed Weights   06/29/16 0825 06/29/16 1227  Weight: 81.647 kg (180 lb) 78.9 kg (173 lb 15.1 oz)    Examination: General exam: Appears comfortable  HEENT: PERRLA, oral mucosa moist, no sclera icterus or thrush Respiratory system: Clear to auscultation. Respiratory effort normal. Cardiovascular system: S1 & S2 heard, RRR.  No murmurs  Gastrointestinal system: Abdomen soft, non-tender, nondistended. Normal bowel sound. No organomegaly Central nervous system: Alert and oriented. No focal neurological deficits. Extremities: No cyanosis, clubbing or edema Skin: ulcers on knee   Psychiatry:  Mood & affect appropriate.     Data Reviewed: I have personally reviewed following labs and imaging studies  CBC:  Recent Labs Lab 06/29/16 0847 06/30/16 0357 07/02/16 0440  WBC 8.2 10.2 9.3  NEUTROABS 6.7  --   --   HGB 10.5* 9.8* 8.9*  HCT 31.4* 29.2* 26.5*  MCV 95.2 95.4 94.6  PLT 164 177 0000000   Basic Metabolic Panel:  Recent Labs Lab 06/29/16 0847 06/30/16 0357 07/01/16 0445 07/02/16 0440  NA 133* 133* 134* 133*  K 4.0 4.1 4.4 4.5  CL 103 103 105 103  CO2 22 21* 23 24  GLUCOSE 225* 243* 245* 276*  BUN 31* 24* 21* 31*  CREATININE 0.80 0.74 0.72 0.78  CALCIUM 8.4* 8.3* 8.2* 8.3*   GFR: Estimated Creatinine Clearance: 59.7 mL/min (by C-G formula based on Cr of 0.78). Liver Function Tests:  Recent Labs Lab 06/29/16 0847  AST 37  ALT 6*  ALKPHOS 78  BILITOT 0.5  PROT 6.7   ALBUMIN 3.2*   No results for input(s): LIPASE, AMYLASE in the last 168 hours. No results for input(s): AMMONIA in the last 168 hours. Coagulation Profile: No results for input(s): INR, PROTIME in the last 168 hours. Cardiac Enzymes: No results for input(s): CKTOTAL, CKMB, CKMBINDEX, TROPONINI in the last 168 hours. BNP (last 3 results) No results for input(s): PROBNP in the last 8760 hours. HbA1C: No results for input(s): HGBA1C in the last 72 hours. CBG:  Recent Labs Lab 07/01/16 1653 07/01/16 2120 07/02/16 0749 07/02/16 1156 07/02/16 1650  GLUCAP 282* 310* 242* 300* 323*   Lipid Profile: No results for input(s): CHOL, HDL, LDLCALC, TRIG, CHOLHDL, LDLDIRECT in the last 72 hours. Thyroid Function Tests: No results for input(s): TSH, T4TOTAL, FREET4, T3FREE, THYROIDAB in the last 72 hours. Anemia  Panel: No results for input(s): VITAMINB12, FOLATE, FERRITIN, TIBC, IRON, RETICCTPCT in the last 72 hours. Urine analysis:    Component Value Date/Time   COLORURINE YELLOW 06/29/2016 0907   APPEARANCEUR CLOUDY* 06/29/2016 0907   LABSPEC 1.026 06/29/2016 0907   PHURINE 5.5 06/29/2016 0907   GLUCOSEU 100* 06/29/2016 0907   HGBUR NEGATIVE 06/29/2016 0907   HGBUR negative 02/25/2010 0743   BILIRUBINUR NEGATIVE 06/29/2016 0907   BILIRUBINUR n 02/25/2016 1237   KETONESUR NEGATIVE 06/29/2016 0907   PROTEINUR 100* 06/29/2016 0907   PROTEINUR 1+ 02/25/2016 1237   UROBILINOGEN 0.2 02/25/2016 1237   UROBILINOGEN 0.2 08/20/2010 1651   NITRITE NEGATIVE 06/29/2016 0907   NITRITE n 02/25/2016 1237   LEUKOCYTESUR NEGATIVE 06/29/2016 0907   Sepsis Labs: @LABRCNTIP (procalcitonin:4,lacticidven:4) ) Recent Results (from the past 240 hour(s))  Culture, blood (Routine X 2)     Status: Abnormal   Collection Time: 06/29/16  8:45 AM  Result Value Ref Range Status   Specimen Description BLOOD RIGHT ANTECUBITAL  Final   Special Requests BOTTLES DRAWN AEROBIC AND ANAEROBIC 5ML  Final    Culture  Setup Time   Final    GRAM POSITIVE COCCI IN CLUSTERS IN BOTH AEROBIC AND ANAEROBIC BOTTLES CRITICAL RESULT CALLED TO, READ BACK BY AND VERIFIED WITH: MLT T.MULLINS [id number] @0710  MLM CRITICAL RESULT CALLED TO, READ BACK BY AND VERIFIED WITH: SEAY,K. RN AT [id number] 06/30/16 MULLINS,T Performed at [facility]    Culture STAPHYLOCOCCUS SPECIES (COAGULASE NEGATIVE) (A)  Final   Report Status 07/02/2016 FINAL  Final   Organism ID, Bacteria STAPHYLOCOCCUS SPECIES (COAGULASE NEGATIVE)  Final      Susceptibility   Staphylococcus species (coagulase negative) - MIC*    CIPROFLOXACIN 2 INTERMEDIATE Intermediate     ERYTHROMYCIN <=0.25 SENSITIVE Sensitive     GENTAMICIN 1 SENSITIVE Sensitive     OXACILLIN >=4 RESISTANT Resistant     TETRACYCLINE <=1 SENSITIVE Sensitive     VANCOMYCIN <=0.5 SENSITIVE Sensitive     TRIMETH/SULFA <=10 SENSITIVE Sensitive     CLINDAMYCIN <=0.25 SENSITIVE Sensitive     RIFAMPIN <=0.5 SENSITIVE Sensitive     Inducible Clindamycin NEGATIVE Sensitive     * STAPHYLOCOCCUS SPECIES (COAGULASE NEGATIVE)  Blood Culture ID Panel (Reflexed)     Status: None   Collection Time: 06/29/16  8:45 AM  Result Value Ref Range Status   Enterococcus species NOT DETECTED NOT DETECTED Final   Vancomycin resistance NOT DETECTED NOT DETECTED Final   Listeria monocytogenes NOT DETECTED NOT DETECTED Final   Staphylococcus species NOT DETECTED NOT DETECTED Final   Staphylococcus aureus NOT DETECTED NOT DETECTED Final   Methicillin resistance NOT DETECTED NOT DETECTED Final   Streptococcus species NOT DETECTED NOT DETECTED Final   Streptococcus agalactiae NOT DETECTED NOT DETECTED Final   Streptococcus pneumoniae NOT DETECTED NOT DETECTED Final   Streptococcus pyogenes NOT DETECTED NOT DETECTED Final   Acinetobacter baumannii NOT DETECTED NOT DETECTED Final   Enterobacteriaceae species NOT DETECTED NOT DETECTED Final   Enterobacter cloacae complex NOT DETECTED NOT DETECTED Final    Escherichia coli NOT DETECTED NOT DETECTED Final   Klebsiella oxytoca NOT DETECTED NOT DETECTED Final   Klebsiella pneumoniae NOT DETECTED NOT DETECTED Final   Proteus species NOT DETECTED NOT DETECTED Final   Serratia marcescens NOT DETECTED NOT DETECTED Final   Carbapenem resistance NOT DETECTED NOT DETECTED Final   Haemophilus influenzae NOT DETECTED NOT DETECTED Final   Neisseria meningitidis NOT DETECTED NOT DETECTED Final   Pseudomonas aeruginosa NOT  DETECTED NOT DETECTED Final   Candida albicans NOT DETECTED NOT DETECTED Final   Candida glabrata NOT DETECTED NOT DETECTED Final   Candida krusei NOT DETECTED NOT DETECTED Final   Candida parapsilosis NOT DETECTED NOT DETECTED Final   Candida tropicalis NOT DETECTED NOT DETECTED Final    Comment: Performed at [facility]  Culture, blood (Routine X 2)     Status: Abnormal (Preliminary result)   Collection Time: 06/29/16  8:47 AM  Result Value Ref Range Status   Specimen Description BLOOD LEFT HAND  Final   Special Requests BOTTLES DRAWN AEROBIC AND ANAEROBIC 5CC  Final   Culture  Setup Time   Final    GRAM POSITIVE COCCI IN CLUSTERS IN BOTH AEROBIC AND ANAEROBIC BOTTLES CRITICAL VALUE NOTED.  VALUE IS CONSISTENT WITH PREVIOUSLY REPORTED AND CALLED VALUE. Performed at [place] (COAGULASE NEGATIVE) (A)  Final   Report Status PENDING  Incomplete  Wound or Superficial Culture     Status: None (Preliminary result)   Collection Time: 06/29/16  8:47 AM  Result Value Ref Range Status   Specimen Description WOUND RIGHT KNEE  Final   Special Requests Normal  Final   Gram Stain   Final    FEW WBC PRESENT, PREDOMINANTLY PMN FEW GRAM POSITIVE COCCI IN PAIRS    Culture   Final    MODERATE STAPHYLOCOCCUS SPECIES (COAGULASE NEGATIVE) MODERATE YEAST IDENTIFICATION TO FOLLOW Performed at [facility]    Report Status PENDING  Incomplete   Organism ID, Bacteria STAPHYLOCOCCUS  SPECIES (COAGULASE NEGATIVE)  Final      Susceptibility   Staphylococcus species (coagulase negative) - MIC*    CIPROFLOXACIN 2 INTERMEDIATE Intermediate     ERYTHROMYCIN <=0.25 SENSITIVE Sensitive     GENTAMICIN 1 SENSITIVE Sensitive     OXACILLIN >=4 RESISTANT Resistant     TETRACYCLINE <=1 SENSITIVE Sensitive     VANCOMYCIN <=0.5 SENSITIVE Sensitive     TRIMETH/SULFA <=10 SENSITIVE Sensitive     CLINDAMYCIN <=0.25 SENSITIVE Sensitive     RIFAMPIN <=0.5 SENSITIVE Sensitive     Inducible Clindamycin NEGATIVE Sensitive     * MODERATE STAPHYLOCOCCUS SPECIES (COAGULASE NEGATIVE)  Urine C&S     Status: Abnormal   Collection Time: 06/29/16  9:07 AM  Result Value Ref Range Status   Specimen Description URINE, CLEAN CATCH  Final   Special Requests Normal  Final   Culture 40,000 COLONIES/mL ENTEROBACTER AEROGENES (A)  Final   Report Status 07/01/2016 FINAL  Final   Organism ID, Bacteria ENTEROBACTER AEROGENES (A)  Final      Susceptibility   Enterobacter aerogenes - MIC*    CEFAZOLIN >=64 RESISTANT Resistant     CEFTRIAXONE >=64 RESISTANT Resistant     CIPROFLOXACIN <=0.25 SENSITIVE Sensitive     GENTAMICIN <=1 SENSITIVE Sensitive     IMIPENEM <=0.25 SENSITIVE Sensitive     NITROFURANTOIN 64 INTERMEDIATE Intermediate     TRIMETH/SULFA <=20 SENSITIVE Sensitive     PIP/TAZO >=128 RESISTANT Resistant     * 40,000 COLONIES/mL ENTEROBACTER AEROGENES  MRSA PCR Screening     Status: None   Collection Time: 06/29/16  1:27 PM  Result Value Ref Range Status   MRSA by PCR NEGATIVE NEGATIVE Final    Comment:        The GeneXpert MRSA Assay (FDA approved for NASAL specimens only), is one component of a comprehensive MRSA colonization surveillance program. It is not intended to diagnose MRSA infection  nor to guide or monitor treatment for MRSA infections.   Culture, blood (Routine X 2) w Reflex to ID Panel     Status: None (Preliminary result)   Collection Time: 06/30/16 12:37 PM  Result  Value Ref Range Status   Specimen Description BLOOD LEFT ARM  Final   Special Requests BOTTLES DRAWN AEROBIC ONLY 5CC  Final   Culture   Final    NO GROWTH 2 DAYS Performed at [facility]    Report Status PENDING  Incomplete  Culture, blood (Routine X 2) w Reflex to ID Panel     Status: None (Preliminary result)   Collection Time: 06/30/16 12:41 PM  Result Value Ref Range Status   Specimen Description BLOOD LEFT HAND  Final   Special Requests IN PEDIATRIC BOTTLE 2CC  Final   Culture  Setup Time   Final    GRAM POSITIVE COCCI IN CLUSTERS AEROBIC BOTTLE ONLY CRITICAL RESULT CALLED TO, READ BACK BY AND VERIFIED WITH: T. PICKERING [facility] AT 1209 07/01/16 BY D. VANHOOK    Culture   Final    GRAM POSITIVE COCCI IN CLUSTERS CULTURE REINCUBATED FOR BETTER GROWTH Performed at [facility]    Report Status PENDING  Incomplete         Radiology Studies: No results found.    Scheduled Meds: . aspirin  81 mg Oral Daily  . calcium-vitamin D  1 tablet Oral BID  . carbidopa-levodopa  2 tablet Oral 4 times per day  . diclofenac  75 mg Oral BID AC  . enoxaparin (LOVENOX) injection  40 mg Subcutaneous Q24H  . feeding supplement (ENSURE ENLIVE)  237 mL Oral TID BM  . finasteride  5 mg Oral Daily  . insulin aspart  0-15 Units Subcutaneous TID WC  . insulin aspart  0-5 Units Subcutaneous QHS  . insulin glargine  10 Units Subcutaneous Daily  . INTEGRA PLUS  1 capsule Oral q morning - 10a  . linagliptin  5 mg Oral Daily  . mirabegron ER  50 mg Oral Daily  . multivitamin with minerals  1 tablet Oral Daily  . pantoprazole  40 mg Oral BID  . pioglitazone  45 mg Oral Daily  . rosuvastatin  10 mg Oral Daily  . saccharomyces boulardii  250 mg Oral BID  . tamsulosin  0.4 mg Oral QPC breakfast  . vancomycin  1,000 mg Intravenous Q12H  . vitamin A  10,000 Units Oral Daily  . zinc sulfate  220 mg Oral Daily   Continuous Infusions:     LOS: 3 days    Time spent in minutes:  [place], MD [facility] Pager: [email or web address] Password [facility] 07/02/2016, 5:05 PM

## 2016-07-02 NOTE — NC FL2 (Signed)
[place] LEVEL OF CARE SCREENING TOOL     IDENTIFICATION  Patient Name: Stanley Comas MD Birthdate: 1925-01-02 Sex: male Admission Date (Current Location): 06/29/2016  [facility] and [place] Number:  Herbalist and Address:  [facility],  [place] [address] [place]      Provider Number: [id number]  Attending Physician Name and Address:  Debbe Odea, MD  Relative Name and Phone Number:       Current Level of Care: Hospital Recommended Level of Care: [place] Prior Approval Number:    Date Approved/Denied:   PASRR Number: [id number]  Discharge Plan: SNF    Current Diagnoses: Patient Active Problem List   Diagnosis Date Noted  . Pressure ulcer 06/30/2016    Class: Chronic  . Sepsis ([place]) 06/30/2016  . Dislocation closed, knee 06/10/2016  . Right knee dislocation 06/10/2016  . History of total knee arthroplasty 05/29/2016  . Parkinson's disease ([place]) 02/25/2016  . Spinal stenosis of lumbar region 07/15/2015  . Tremors of nervous system 01/29/2015  . Bradycardia 11/07/2014  . Colon polyp 10/17/2014  . Mobitz type 1 second degree atrioventricular block   . Heme + stool 10/15/2014  . Symptomatic anemia 10/14/2014  . Dyspnea on exertion 10/14/2014  . Unsteady gait 10/02/2014  . Chronic systolic CHF (congestive heart failure) ([place]) 05/12/2014  . Orthostatic lightheadedness 03/19/2013  . First degree AV block 07/25/2012  . Atherosclerosis of native arteries of the extremities with ulceration ([place]) 02/17/2012  . HYPOTENSION, UNSPECIFIED 10/08/2010  . SECOND DEGREE AV HEART BLOCK, MOBITZ I 09/04/2010  . S/P AVR 09/01/2010  . CAD, NATIVE VESSEL 06/24/2010  . Diabetes mellitus without complication ([place]) 0000000  . CAD (coronary artery disease) of artery bypass graft 02/16/2010  . Obstructive sleep apnea 07/21/2009  . Hypothyroidism 12/07/2007  . HTN (hypertension) 12/07/2007  . Iron deficiency anemia  12/07/2007  . ELEVATED PROSTATE SPECIFIC ANTIGEN 12/07/2007    Orientation RESPIRATION BLADDER Height & Weight     Self, Time, Situation, Place    Continent Weight: 173 lb 15.1 oz (78.9 kg) Height:  5\' 7"  (170.2 cm)  BEHAVIORAL SYMPTOMS/MOOD NEUROLOGICAL BOWEL NUTRITION STATUS      Incontinent Diet (Carb modified )  AMBULATORY STATUS COMMUNICATION OF NEEDS Skin   Limited Assist Verbally                         Personal Care Assistance Level of Assistance  Bathing, Dressing Bathing Assistance: Limited assistance   Dressing Assistance: Limited assistance     Functional Limitations Info             SPECIAL CARE FACTORS FREQUENCY  PT (By licensed PT), OT (By licensed OT)     PT Frequency: 5 OT Frequency: 5            Contractures      Additional Factors Info  Code Status, Allergies Code Status Info: Full Code  Allergies Info: Allergies:  Ace Inhibitors, Succinylcholine, Morphine And Related           Current Medications (07/02/2016):  This is the current hospital active medication list Current Facility-Administered Medications  Medication Dose Route Frequency Provider Last Rate Last Dose  . acetaminophen (TYLENOL) tablet 650 mg  650 mg Oral Q6H PRN Belkys A Regalado, MD   650 mg at 06/29/16 1426   Or  . acetaminophen (TYLENOL) suppository 650 mg  650 mg Rectal Q6H PRN Elmarie Shiley, MD      .  alum & mag hydroxide-simeth (MAALOX/MYLANTA) 200-200-20 MG/5ML suspension 30 mL  30 mL Oral Q4H PRN Belkys A Regalado, MD   30 mL at 06/30/16 0252  . aspirin chewable tablet 81 mg  81 mg Oral Daily Belkys A Regalado, MD   81 mg at 07/02/16 1040  . calcium-vitamin D (OSCAL WITH D) 500-200 MG-UNIT per tablet 1 tablet  1 tablet Oral BID Belkys A Regalado, MD   1 tablet at 07/02/16 1039  . carbidopa-levodopa (SINEMET IR) 25-100 MG per tablet immediate release 2 tablet  2 tablet Oral 4 times per day Minda Ditto, RPH   2 tablet at 07/02/16 1315  . diclofenac (VOLTAREN)  EC tablet 75 mg  75 mg Oral BID AC Debbe Odea, MD   75 mg at 07/02/16 0838  . enoxaparin (LOVENOX) injection 40 mg  40 mg Subcutaneous Q24H Belkys A Regalado, MD   40 mg at 07/01/16 1740  . feeding supplement (ENSURE ENLIVE) (ENSURE ENLIVE) liquid 237 mL  237 mL Oral TID BM Debbe Odea, MD   237 mL at 07/02/16 1317  . finasteride (PROSCAR) tablet 5 mg  5 mg Oral Daily Belkys A Regalado, MD   5 mg at 07/02/16 1040  . HYDROcodone-acetaminophen (NORCO) 10-325 MG per tablet 1 tablet  1 tablet Oral Q6H PRN Elmarie Shiley, MD   1 tablet at 07/02/16 0843  . insulin aspart (novoLOG) injection 0-15 Units  0-15 Units Subcutaneous TID WC Debbe Odea, MD   8 Units at 07/02/16 1316  . insulin aspart (novoLOG) injection 0-5 Units  0-5 Units Subcutaneous QHS Debbe Odea, MD   4 Units at 07/01/16 2200  . insulin glargine (LANTUS) injection 10 Units  10 Units Subcutaneous Daily Debbe Odea, MD   10 Units at 07/02/16 1041  . INTEGRA PLUS CAPS 1 capsule  1 capsule Oral q morning - 10a Belkys A Regalado, MD   1 capsule at 07/02/16 1038  . linagliptin (TRADJENTA) tablet 5 mg  5 mg Oral Daily Debbe Odea, MD   5 mg at 07/02/16 1040  . LORazepam (ATIVAN) tablet 0.5 mg  0.5 mg Oral Q8H PRN Belkys A Regalado, MD      . mirabegron ER (MYRBETRIQ) tablet 50 mg  50 mg Oral Daily Belkys A Regalado, MD   50 mg at 07/02/16 1039  . multivitamin with minerals tablet 1 tablet  1 tablet Oral Daily Belkys A Regalado, MD   1 tablet at 07/02/16 1038  . ondansetron (ZOFRAN) injection 4 mg  4 mg Intravenous Q6H PRN Gardiner Barefoot, NP   4 mg at 06/29/16 2321  . pantoprazole (PROTONIX) EC tablet 40 mg  40 mg Oral BID Belkys A Regalado, MD   40 mg at 07/02/16 1040  . pioglitazone (ACTOS) tablet 45 mg  45 mg Oral Daily Debbe Odea, MD   45 mg at 07/02/16 1039  . prochlorperazine (COMPAZINE) tablet 10 mg  10 mg Oral Q6H PRN Belkys A Regalado, MD   10 mg at 06/30/16 1753  . rosuvastatin (CRESTOR) tablet 10 mg  10 mg Oral Daily  Belkys A Regalado, MD   10 mg at 07/02/16 1042  . saccharomyces boulardii (FLORASTOR) capsule 250 mg  250 mg Oral BID Debbe Odea, MD   250 mg at 07/02/16 1040  . tamsulosin (FLOMAX) capsule 0.4 mg  0.4 mg Oral QPC breakfast Belkys A Regalado, MD   0.4 mg at 07/02/16 0838  . traMADol (ULTRAM) tablet 50 mg  50 mg Oral Q12H  PRN Elmarie Shiley, MD   50 mg at 07/01/16 0846  . vancomycin (VANCOCIN) IVPB 1000 mg/200 mL premix  1,000 mg Intravenous Q12H Donald Prose Runyon, RPH   1,000 mg at 07/02/16 1041  . vitamin A capsule 10,000 Units  10,000 Units Oral Daily Debbe Odea, MD   10,000 Units at 07/01/16 1204  . zinc sulfate capsule 220 mg  220 mg Oral Daily Belkys A Regalado, MD   220 mg at 07/02/16 1042  . zolpidem (AMBIEN) tablet 10 mg  10 mg Oral QHS PRN Debbe Odea, MD         Discharge Medications: Please see discharge summary for a list of discharge medications.  Relevant Imaging Results:  Relevant Lab Results:   Additional Information    Weston Anna, LCSW    Debbe Odea, MD

## 2016-07-02 NOTE — Care Management Important Message (Signed)
Important Message  Patient Details  Name: Stanley GARRETSON MD MRN: [id number] Date of Birth: 17-Jan-1925   Medicare Important Message Given:  Yes    Camillo Flaming 07/02/2016, 9:28 [place] Message  Patient Details  Name: Stanley DECAPRIO MD MRN: [id number] Date of Birth: 12/05/1925   Medicare Important Message Given:  Yes    Camillo Flaming 07/02/2016, 9:28 AM

## 2016-07-02 NOTE — Consult Note (Addendum)
Reason for Consult: right knee wound Referring Physician: Dr. 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  HPI: The patient is an 82 yrs old wm here for evaluation of his right knee.  He underwent a knee replacement and had two dislocations recently.  He was placed in position but now has a wound on the knee area.  This is likely a pressure wound from the brace.  He had an episode of confusion and not feeling well and was brought to [facility].  He is doing much better and eager to have his knee cleaned.  It is ~ 2 x 4 cm on the anterior aspect with soft tissue exposed.  He has a newer wound as well on the lateral aspect that is superficial but not healing well.  Past Medical History  Diagnosis Date  . Diabetes mellitus   . BPH (benign prostatic hyperplasia)   . Arthritis   . GERD (gastroesophageal reflux disease)   . Hypertension     pt denies  . Shortness of breath     uses cpap  . Peripheral vascular disease ([place]) 03-06-12    aortogram showed severe bilateral unreconstructible tibial artery disease  . Failed total knee, right ([place])   . H pylori ulcer     Gastric ulcer  . CAD (coronary artery disease)  s/p CABG     sees Dr. Loralie Champagne   . Aortic stenosis     S/p pericardial valve  . Mobitz type 1 second degree atrioventricular block   . Shingles 05-12-12  . Lumbar spinal stenosis     sees Dr. Sherwood Gambler   . Anemia     sees Dr. Julien Nordmann     Past Surgical History  Procedure Laterality Date  . Aortic valve replacement    . Foot arthrotomy Right   . Tonsillectomy    . Cataract extraction, bilateral    . Humerus fracture surgery    . Replacement total knee Bilateral   . Toe amputation      rt 2nd  . Cardiac catheterization    . Amputation  01/19/2012    Procedure: AMPUTATION DIGIT;  Surgeon: Colin Rhein, MD;  Location: [place];  Service: Orthopedics;  Laterality: Right;  right great toe amputation through MTP joint  . Rotator cuff repair Right   .  Enteroscopy N/A 10/15/2014    Procedure: ENTEROSCOPY;  Surgeon: Gatha Mayer, MD;  Location: [facility];  Service: Endoscopy;  Laterality: N/A;  . Colonoscopy with propofol N/A 10/17/2014    Procedure: COLONOSCOPY WITH PROPOFOL;  Surgeon: Gatha Mayer, MD;  Location: [facility];  Service: Endoscopy;  Laterality: N/A;  . Abdominal aortagram N/A 02/25/2012    Procedure: ABDOMINAL AORTAGRAM;  Surgeon: Elam Dutch, MD;  Location: [facility] CATH LAB;  Service: Cardiovascular;  Laterality: N/A;  . Knee closed reduction N/A 05/29/2016    Procedure: CLOSED MANIPULATION KNEE;  Surgeon: Latanya Maudlin, MD;  Location: [facility];  Service: Orthopedics;  Laterality: N/A;  . Closed reduction patellar Right 06/09/2016    Procedure: CLOSED REDUCTION PATELLAR;  Surgeon: Gaynelle Arabian, MD;  Location: [facility];  Service: Orthopedics;  Laterality: Right;    Family History  Problem Relation Age of Onset  . Diabetes type II Mother   . Diabetes type II Father   . Colon cancer Neg Hx   . Esophageal cancer Neg Hx   . Rectal cancer Neg Hx   . Stomach cancer Neg Hx   .  Heart attack Neg Hx   . Stroke Neg Hx   . Hypertension Mother   . Hypertension Father   . Diabetes Father     Social History:  reports that he has never smoked. He has never used smokeless tobacco. He reports that he drinks about 4.2 oz of alcohol per week. He reports that he does not use illicit drugs.  Allergies:  Allergies  Allergen Reactions  . Ace Inhibitors Other (See Comments)    cough  . Succinylcholine Other (See Comments)    Prolonged sedation  . Morphine And Related Nausea And Vomiting    Medications: I have reviewed the patient's current medications.  Results for orders placed or performed during the hospital encounter of 06/29/16 (from the past 48 hour(s))  Culture, blood (Routine X 2) w Reflex to ID Panel     Status: None (Preliminary result)   Collection Time: 06/30/16 12:37 PM  Result Value Ref Range   Specimen  Description BLOOD LEFT ARM    Special Requests BOTTLES DRAWN AEROBIC ONLY 5CC    Culture      NO GROWTH 1 DAY Performed at [facility]    Report Status PENDING   Culture, blood (Routine X 2) w Reflex to ID Panel     Status: None (Preliminary result)   Collection Time: 06/30/16 12:41 PM  Result Value Ref Range   Specimen Description BLOOD LEFT HAND    Special Requests IN PEDIATRIC BOTTLE 2CC    Culture  Setup Time      GRAM POSITIVE COCCI IN CLUSTERS AEROBIC BOTTLE ONLY CRITICAL RESULT CALLED TO, READ BACK BY AND VERIFIED WITH: T. PICKERING [place] AT 1209 07/01/16 BY D. VANHOOK    Culture      GRAM POSITIVE COCCI IN CLUSTERS CULTURE REINCUBATED FOR BETTER GROWTH Performed at [facility]    Report Status PENDING   Glucose, capillary     Status: Abnormal   Collection Time: 06/30/16  4:44 PM  Result Value Ref Range   Glucose-Capillary 277 (H) 65 - 99 mg/dL  Glucose, capillary     Status: Abnormal   Collection Time: 06/30/16  9:51 PM  Result Value Ref Range   Glucose-Capillary 290 (H) 65 - 99 mg/dL  Prealbumin     Status: Abnormal   Collection Time: 07/01/16  4:45 AM  Result Value Ref Range   Prealbumin 11.0 (L) 18 - 38 mg/dL    Comment: Performed at [place] metabolic panel     Status: Abnormal   Collection Time: 07/01/16  4:45 AM  Result Value Ref Range   Sodium 134 (L) 135 - 145 mmol/L   Potassium 4.4 3.5 - 5.1 mmol/L   Chloride 105 101 - 111 mmol/L   CO2 23 22 - 32 mmol/L   Glucose, Bld 245 (H) 65 - 99 mg/dL   BUN 21 (H) 6 - 20 mg/dL   Creatinine, Ser 0.72 0.61 - 1.24 mg/dL   Calcium 8.2 (L) 8.9 - 10.3 mg/dL   GFR calc non Af Amer >60 >60 mL/min   GFR calc Af Amer >60 >60 mL/min    Comment: (NOTE) The eGFR has been calculated using the CKD EPI equation. This calculation has not been validated in all clinical situations. eGFR's persistently <60 mL/min signify possible Chronic Kidney Disease.    Anion gap 6 5 - 15  Glucose, capillary      Status: Abnormal   Collection Time: 07/01/16  7:39 AM  Result Value Ref  Range   Glucose-Capillary 231 (H) 65 - 99 mg/dL  Glucose, capillary     Status: Abnormal   Collection Time: 07/01/16 12:36 PM  Result Value Ref Range   Glucose-Capillary 316 (H) 65 - 99 mg/dL  Glucose, capillary     Status: Abnormal   Collection Time: 07/01/16  4:53 PM  Result Value Ref Range   Glucose-Capillary 282 (H) 65 - 99 mg/dL  Glucose, capillary     Status: Abnormal   Collection Time: 07/01/16  9:20 PM  Result Value Ref Range   Glucose-Capillary 310 (H) 65 - 99 mg/dL  Basic metabolic panel     Status: Abnormal   Collection Time: 07/02/16  4:40 AM  Result Value Ref Range   Sodium 133 (L) 135 - 145 mmol/L   Potassium 4.5 3.5 - 5.1 mmol/L   Chloride 103 101 - 111 mmol/L   CO2 24 22 - 32 mmol/L   Glucose, Bld 276 (H) 65 - 99 mg/dL   BUN 31 (H) 6 - 20 mg/dL   Creatinine, Ser 0.78 0.61 - 1.24 mg/dL   Calcium 8.3 (L) 8.9 - 10.3 mg/dL   GFR calc non Af Amer >60 >60 mL/min   GFR calc Af Amer >60 >60 mL/min    Comment: (NOTE) The eGFR has been calculated using the CKD EPI equation. This calculation has not been validated in all clinical situations. eGFR's persistently <60 mL/min signify possible Chronic Kidney Disease.    Anion gap 6 5 - 15  CBC     Status: Abnormal   Collection Time: 07/02/16  4:40 AM  Result Value Ref Range   WBC 9.3 4.0 - 10.5 K/uL   RBC 2.80 (L) 4.22 - 5.81 MIL/uL   Hemoglobin 8.9 (L) 13.0 - 17.0 g/dL   HCT 26.5 (L) 39.0 - 52.0 %   MCV 94.6 78.0 - 100.0 fL   MCH 31.8 26.0 - 34.0 pg   MCHC 33.6 30.0 - 36.0 g/dL   RDW 14.5 11.5 - 15.5 %   Platelets 178 150 - 400 K/uL  Glucose, capillary     Status: Abnormal   Collection Time: 07/02/16  7:49 AM  Result Value Ref Range   Glucose-Capillary 242 (H) 65 - 99 mg/dL  Glucose, capillary     Status: Abnormal   Collection Time: 07/02/16 11:56 AM  Result Value Ref Range   Glucose-Capillary 300 (H) 65 - 99 mg/dL    No results  found.  Review of Systems  Constitutional: Positive for fever, chills and malaise/fatigue.  HENT: Negative.   Eyes: Negative.   Respiratory: Negative.   Cardiovascular: Negative.   Gastrointestinal: Negative.   Genitourinary: Negative.   Musculoskeletal: Negative.   Skin: Negative.   Neurological: Positive for weakness.  Psychiatric/Behavioral: Negative.    Blood pressure 139/75, pulse 90, temperature 97.9 F (36.6 C), temperature source Oral, resp. rate 20, height 5' 7"  (1.702 m), weight 78.9 kg (173 lb 15.1 oz), SpO2 97 %. Physical Exam  Constitutional: He is oriented to person, place, and time. He appears well-developed and well-nourished.  HENT:  Head: Normocephalic and atraumatic.  Eyes: EOM are normal. Pupils are equal, round, and reactive to light.  Cardiovascular: Normal rate.   Respiratory: Effort normal. No respiratory distress.  GI: Soft.  Musculoskeletal:       Legs: Neurological: He is alert and oriented to person, place, and time.  Skin: Skin is warm.  Psychiatric: He has a normal mood and affect. His behavior is normal. Judgment and thought  content normal.    Assessment/Plan: Plan for OR this coming Monday for debridement and placement of Acell and possible VAC to right knee. Prealbumin is low and recommend nutrition and dietitian.  Wallace Going 07/02/2016, 12:27 PM

## 2016-07-03 LAB — RETICULOCYTES
RBC.: 3.06 MIL/uL — ABNORMAL LOW (ref 4.22–5.81)
RETIC CT PCT: 1.4 % (ref 0.4–3.1)
Retic Count, Absolute: 42.8 10*3/uL (ref 19.0–186.0)

## 2016-07-03 LAB — CBC
HCT: 28.9 % — ABNORMAL LOW (ref 39.0–52.0)
Hemoglobin: 9.7 g/dL — ABNORMAL LOW (ref 13.0–17.0)
MCH: 31.7 pg (ref 26.0–34.0)
MCHC: 33.6 g/dL (ref 30.0–36.0)
MCV: 94.4 fL (ref 78.0–100.0)
PLATELETS: 263 10*3/uL (ref 150–400)
RBC: 3.06 MIL/uL — AB (ref 4.22–5.81)
RDW: 14.4 % (ref 11.5–15.5)
WBC: 10 10*3/uL (ref 4.0–10.5)

## 2016-07-03 LAB — CULTURE, BLOOD (ROUTINE X 2)

## 2016-07-03 LAB — AEROBIC CULTURE  (SUPERFICIAL SPECIMEN)

## 2016-07-03 LAB — BASIC METABOLIC PANEL
Anion gap: 7 (ref 5–15)
BUN: 32 mg/dL — AB (ref 6–20)
CHLORIDE: 101 mmol/L (ref 101–111)
CO2: 27 mmol/L (ref 22–32)
CREATININE: 0.86 mg/dL (ref 0.61–1.24)
Calcium: 8.8 mg/dL — ABNORMAL LOW (ref 8.9–10.3)
GFR calc Af Amer: >60 mL/min (ref >60)
GFR calc non Af Amer: >60 mL/min (ref >60)
Glucose, Bld: 240 mg/dL — ABNORMAL HIGH (ref 65–99)
POTASSIUM: 4.4 mmol/L (ref 3.5–5.1)
Sodium: 135 mmol/L (ref 135–145)

## 2016-07-03 LAB — IRON AND TIBC
IRON: 17 ug/dL — AB (ref 45–182)
Saturation Ratios: 10 % — ABNORMAL LOW (ref 17.9–39.5)
TIBC: 176 ug/dL — AB (ref 250–450)
UIBC: 159 ug/dL

## 2016-07-03 LAB — VITAMIN B12: Vitamin B-12: 1002 pg/mL — ABNORMAL HIGH (ref 180–914)

## 2016-07-03 LAB — AEROBIC CULTURE W GRAM STAIN (SUPERFICIAL SPECIMEN): Special Requests: NORMAL

## 2016-07-03 LAB — GLUCOSE, CAPILLARY
GLUCOSE-CAPILLARY: 292 mg/dL — AB (ref 65–99)
GLUCOSE-CAPILLARY: 229 mg/dL — AB (ref 65–99)
Glucose-Capillary: 234 mg/dL — ABNORMAL HIGH (ref 65–99)
Glucose-Capillary: 228 mg/dL — ABNORMAL HIGH (ref 65–99)

## 2016-07-03 LAB — FERRITIN: FERRITIN: 1910 ng/mL — AB (ref 24–336)

## 2016-07-03 LAB — VANCOMYCIN, TROUGH: Vancomycin Tr: 32 ug/mL (ref 15–20)

## 2016-07-03 LAB — FOLATE: Folate: 45.3 ng/mL (ref >5.9)

## 2016-07-03 MED ORDER — CHLORHEXIDINE GLUCONATE CLOTH 2 % EX PADS
6.0000 | MEDICATED_PAD | Freq: Once | CUTANEOUS | Status: AC
  Administered 2016-07-03: 6 via TOPICAL

## 2016-07-03 MED ORDER — FLUCONAZOLE IN SODIUM CHLORIDE 400-0.9 MG/200ML-% IV SOLN
800.0000 mg | Freq: Once | INTRAVENOUS | Status: AC
  Administered 2016-07-03: 800 mg via INTRAVENOUS
  Filled 2016-07-03: qty 400

## 2016-07-03 MED ORDER — SODIUM CHLORIDE 0.9 % IV SOLN
25.0000 mg | Freq: Once | INTRAVENOUS | Status: AC
  Administered 2016-07-03: 25 mg via INTRAVENOUS
  Filled 2016-07-03: qty 2

## 2016-07-03 MED ORDER — FLUCONAZOLE IN SODIUM CHLORIDE 400-0.9 MG/200ML-% IV SOLN: 400.0000 mg | INTRAVENOUS | Status: DC

## 2016-07-03 MED ORDER — CHLORHEXIDINE GLUCONATE CLOTH 2 % EX PADS: 6.0000 | MEDICATED_PAD | Freq: Once | CUTANEOUS | Status: DC

## 2016-07-03 MED ORDER — INSULIN ASPART 100 UNIT/ML ~~LOC~~ SOLN
0.0000 [IU] | Freq: Three times a day (TID) | SUBCUTANEOUS | Status: DC
  Administered 2016-07-03 (×2): 7 [IU] via SUBCUTANEOUS
  Administered 2016-07-04: 3 [IU] via SUBCUTANEOUS
  Administered 2016-07-04: 7 [IU] via SUBCUTANEOUS
  Administered 2016-07-04: 20 [IU] via SUBCUTANEOUS
  Administered 2016-07-05: 4 [IU] via SUBCUTANEOUS
  Administered 2016-07-05: 15 [IU] via SUBCUTANEOUS
  Administered 2016-07-06 – 2016-07-07 (×3): 3 [IU] via SUBCUTANEOUS
  Administered 2016-07-07: 20 [IU] via SUBCUTANEOUS
  Administered 2016-07-08: 7 [IU] via SUBCUTANEOUS
  Administered 2016-07-08: 3 [IU] via SUBCUTANEOUS
  Administered 2016-07-08 – 2016-07-09 (×2): 15 [IU] via SUBCUTANEOUS

## 2016-07-03 MED ORDER — SODIUM CHLORIDE 0.9 % IV SOLN
125.0000 mg | Freq: Once | INTRAVENOUS | Status: AC
  Administered 2016-07-03: 125 mg via INTRAVENOUS
  Filled 2016-07-03: qty 10

## 2016-07-03 NOTE — Progress Notes (Signed)
      INFECTIOUS DISEASE ATTENDING ADDENDUM:  I was called by Pharmacy due to culture + for Candida albicans from knee  My concern was that this represented an isolate from joint fluid  I have discussed with Dr. Migdalia Dk and she and her team did not collect any culture.   From reading Dr. Charlestine Night note he did not appear concerned that the joint which has dislocated twice in the past year was infected and had not aspirated this man's prosthetic joint   I am reassured that this patient's Candida albicans is coming only from a superficial culture and not coming from the prosthetic joint itself  HOWEVER I believe we should be having further discussion re ? The prosthetic joint is infected or not and whether or not an aspirate from the joint for   Cell count and differential most importantly and then if sufficient fluid around also culture might help [place] know if Dr. [name] needs further surgery on the prosthesis itsefl such as I and D vs polyexchange vs two staged resection arthroplasty  I will round on the patient tomorrow and touch base with Dr. Gladstone Lighter and Dr. Migdalia Dk again (Dr Migdalia Dk has planned on surgery to repair the wound caused by the brace due to his recurrent dislocations)       Rhina Brackett [place] 07/03/2016, 6:10 PM

## 2016-07-03 NOTE — Clinical Social Work Note (Signed)
Bed offers given to patient. He still prefers [place]. [facility] has offered a bed.    Liz Beach MSW, [place], [place], [id number]

## 2016-07-03 NOTE — Progress Notes (Signed)
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 He has been on keflex for the wound. He presents with confusion, fever, increased drainage and incontinence. He has been having frequent falls.   Subjective: Feels tired. No new complaints.   Assessment & Plan:   Principal Problem:   Sepsis - fever 101, confusion, knee wound and blood cultures growing coag neg staph- repeat cultures on 7/12 also postiive 1/ 2 sets - third set of blood cultures ordered today- if negative, she will need PICC - ECHO > no vegetations noted- recommended TEE- I have contacted cardiology - ID managing antibiotics - spoke with plastic surgery - patient will go to or for I and D on Monday  Active Problems:  Right knee dislocation - s/p closed reduction x 2  - superficial wound infection- ortho and plastic surgery eval- see above   Diabetes mellitus without complication  - sugars elevated despite poor PO intake- recently received epidural steroid about 1 month ago - resumed Actos and Januvia - increased SSI- added Lantus - will increase Lantus- he is drinking Ensure TID between meals which he prefers over Glucerna and this is exacerbating his sugars further -will be giving 16 U Novolog now at dinner - A1c 7.6 in 3/17- no need to recheck at this admission-  - diabetic diet  Obstructive sleep apnea CPAP    CAD (coronary artery disease) of artery bypass graft - Aspirin  Anemia -  h/o Iron deficiency anemia followed by Dr Julien Nordmann - cont oral iron    S/P AVR    Chronic systolic CHF (congestive heart failure) - stopped IVF  - following I and O    Parkinson's disease - Sinemet  BPH/ bladder issues? - on Proscar, Flomax  and Myrbetriq   DVT prophylaxis: Lovenox Code Status: full code Family Communication:  Disposition Plan:  Consultants:   ID, ortho, plastic surgery Procedures:  2 D ECHO 07/01/16 Study Conclusions  - Left ventricle: The cavity size was normal. Wall thickness was  increased in a pattern of moderate LVH. Systolic function was  normal. The estimated ejection fraction was in the range of 50%  to 55%. Although no diagnostic regional wall motion abnormality  was identified, this possibility cannot be completely excluded on  the basis of this study. - Aortic valve: A bioprosthesis was present and functioning  normally. - Aortic root: The aortic root was mildly dilated. - Mitral valve: There was mild regurgitation. - Left atrium: The atrium was moderately dilated. - Right ventricle: The cavity size was dilated. Wall thickness was  normal. Systolic function was mildly reduced. - Right atrium: The atrium was moderately dilated. - Tricuspid valve: There was mild-moderate regurgitation. - Pulmonary arteries: Systolic pressure was moderately increased.  PA peak pressure: 53 mm Hg (S). Antimicrobials:  Anti-infectives    Start     Dose/Rate Route Frequency Ordered Stop   06/29/16 2200  vancomycin (VANCOCIN) IVPB 1000 mg/200 mL premix  Status:  Discontinued     1,000 mg 200 mL/hr over 60 Minutes Intravenous Every 12 hours 06/29/16 0939 07/03/16 1218   06/29/16 1600  piperacillin-tazobactam (ZOSYN) IVPB 3.375 g  Status:  Discontinued     3.375 g 12.5 mL/hr over 240  Minutes Intravenous Every 8 hours 06/29/16 0940 07/01/16 1335   06/29/16 0915  piperacillin-tazobactam (ZOSYN) IVPB 3.375 g     3.375 g 100 mL/hr over 30 Minutes Intravenous  Once 06/29/16 0911 06/29/16 0950   06/29/16 0915  vancomycin (VANCOCIN) IVPB 1000 mg/200 mL premix     1,000 mg 200 mL/hr over 60 Minutes Intravenous  Once 06/29/16 0912 06/29/16 1124       Objective: Filed Vitals:   07/02/16 1411 07/02/16  2129 07/03/16 0417 07/03/16 1244  BP: 119/53 127/54 132/67 127/42  Pulse: 78 51 108 65  Temp: 97.6 F (36.4 C) 97.8 F (36.6 C)  98.5 F (36.9 C)  TempSrc: Axillary Oral Oral Oral  Resp: 20 20 20 20   Height:      Weight:      SpO2: 98% 97% 100% 95%    Intake/Output Summary (Last 24 hours) at 07/03/16 1433 Last data filed at 07/03/16 1144  Gross per 24 hour  Intake    200 ml  Output   2350 ml  Net  -2150 ml   Filed Weights   06/29/16 0825 06/29/16 1227  Weight: 81.647 kg (180 lb) 78.9 kg (173 lb 15.1 oz)    Examination: General exam: Appears comfortable  HEENT: PERRLA, oral mucosa moist, no sclera icterus or thrush Respiratory system: Clear to auscultation. Respiratory effort normal. Cardiovascular system: S1 & S2 heard, RRR.  No murmurs  Gastrointestinal system: Abdomen soft, non-tender, nondistended. Normal bowel sound. No organomegaly Central nervous system: Alert and oriented. No focal neurological deficits. Extremities: No cyanosis, clubbing or edema Skin: ulcers on knee   Psychiatry:  Mood & affect appropriate.     Data Reviewed: I have personally reviewed following labs and imaging studies  CBC:  Recent Labs Lab 06/29/16 0847 06/30/16 0357 07/02/16 0440 07/03/16 0423  WBC 8.2 10.2 9.3 10.0  NEUTROABS 6.7  --   --   --   HGB 10.5* 9.8* 8.9* 9.7*  HCT 31.4* 29.2* 26.5* 28.9*  MCV 95.2 95.4 94.6 94.4  PLT 164 177 178 [id number]   Basic Metabolic Panel:  Recent Labs Lab 06/29/16 0847 06/30/16 0357 07/01/16 0445 07/02/16 0440 07/03/16 0423  NA 133* 133* 134* 133* 135  K 4.0 4.1 4.4 4.5 4.4  CL 103 103 105 103 101  CO2 22 21* 23 24 27   GLUCOSE 225* 243* 245* 276* 240*  BUN 31* 24* 21* 31* 32*  CREATININE 0.80 0.74 0.72 0.78 0.86  CALCIUM 8.4* 8.3* 8.2* 8.3* 8.8*   GFR: Estimated Creatinine Clearance: 55.5 mL/min (by C-G formula based on Cr of 0.86). Liver Function Tests:  Recent Labs Lab 06/29/16 0847  AST 37  ALT 6*  ALKPHOS 78  BILITOT  0.5  PROT 6.7  ALBUMIN 3.2*   No results for input(s): LIPASE, AMYLASE in the last 168 hours. No results for input(s): AMMONIA in the last 168 hours. Coagulation Profile: No results for input(s): INR, PROTIME in the last 168 hours. Cardiac Enzymes: No results for input(s): CKTOTAL, CKMB, CKMBINDEX, TROPONINI in the last 168 hours. BNP (last 3 results) No results for input(s): PROBNP in the last 8760 hours. HbA1C: No results for input(s): HGBA1C in the last 72 hours. CBG:  Recent Labs Lab 07/02/16 1156 07/02/16 1650 07/02/16 2132 07/03/16 0805 07/03/16 1159  GLUCAP 300* 323* 127* 234* 228*   Lipid Profile: No results for input(s): CHOL, HDL, LDLCALC, TRIG, CHOLHDL, LDLDIRECT in the last 72 hours. Thyroid Function Tests: No results for input(s): TSH, T4TOTAL, FREET4,  T3FREE, THYROIDAB in the last 72 hours. Anemia Panel:  Recent Labs  07/03/16 0423  VITAMINB12 1002*  FOLATE 45.3  FERRITIN 1910*  TIBC 176*  IRON 17*  RETICCTPCT 1.4   Urine analysis:    Component Value Date/Time   COLORURINE YELLOW 06/29/2016 0907   APPEARANCEUR CLOUDY* 06/29/2016 0907   LABSPEC 1.026 06/29/2016 0907   PHURINE 5.5 06/29/2016 0907   GLUCOSEU 100* 06/29/2016 0907   HGBUR NEGATIVE 06/29/2016 0907   HGBUR negative 02/25/2010 0743   BILIRUBINUR NEGATIVE 06/29/2016 0907   BILIRUBINUR n 02/25/2016 1237   KETONESUR NEGATIVE 06/29/2016 0907   PROTEINUR 100* 06/29/2016 0907   PROTEINUR 1+ 02/25/2016 1237   UROBILINOGEN 0.2 02/25/2016 1237   UROBILINOGEN 0.2 08/20/2010 1651   NITRITE NEGATIVE 06/29/2016 0907   NITRITE n 02/25/2016 1237   LEUKOCYTESUR NEGATIVE 06/29/2016 0907   Sepsis Labs: @LABRCNTIP (procalcitonin:4,lacticidven:4) ) Recent Results (from the past 240 hour(s))  Culture, blood (Routine X 2)     Status: Abnormal   Collection Time: 06/29/16  8:45 AM  Result Value Ref Range Status   Specimen Description BLOOD RIGHT ANTECUBITAL  Final   Special Requests BOTTLES DRAWN  AEROBIC AND ANAEROBIC 5ML  Final   Culture  Setup Time   Final    GRAM POSITIVE COCCI IN CLUSTERS IN BOTH AEROBIC AND ANAEROBIC BOTTLES CRITICAL RESULT CALLED TO, READ BACK BY AND VERIFIED WITH: MLT T.MULLINS [id number] @0710  MLM CRITICAL RESULT CALLED TO, READ BACK BY AND VERIFIED WITH: SEAY,K. RN AT [id number] 06/30/16 MULLINS,T Performed at [facility]    Culture STAPHYLOCOCCUS SPECIES (COAGULASE NEGATIVE) (A)  Final   Report Status 07/02/2016 FINAL  Final   Organism ID, Bacteria STAPHYLOCOCCUS SPECIES (COAGULASE NEGATIVE)  Final      Susceptibility   Staphylococcus species (coagulase negative) - MIC*    CIPROFLOXACIN 2 INTERMEDIATE Intermediate     ERYTHROMYCIN <=0.25 SENSITIVE Sensitive     GENTAMICIN 1 SENSITIVE Sensitive     OXACILLIN >=4 RESISTANT Resistant     TETRACYCLINE <=1 SENSITIVE Sensitive     VANCOMYCIN <=0.5 SENSITIVE Sensitive     TRIMETH/SULFA <=10 SENSITIVE Sensitive     CLINDAMYCIN <=0.25 SENSITIVE Sensitive     RIFAMPIN <=0.5 SENSITIVE Sensitive     Inducible Clindamycin NEGATIVE Sensitive     * STAPHYLOCOCCUS SPECIES (COAGULASE NEGATIVE)  Blood Culture ID Panel (Reflexed)     Status: None   Collection Time: 06/29/16  8:45 AM  Result Value Ref Range Status   Enterococcus species NOT DETECTED NOT DETECTED Final   Vancomycin resistance NOT DETECTED NOT DETECTED Final   Listeria monocytogenes NOT DETECTED NOT DETECTED Final   Staphylococcus species NOT DETECTED NOT DETECTED Final   Staphylococcus aureus NOT DETECTED NOT DETECTED Final   Methicillin resistance NOT DETECTED NOT DETECTED Final   Streptococcus species NOT DETECTED NOT DETECTED Final   Streptococcus agalactiae NOT DETECTED NOT DETECTED Final   Streptococcus pneumoniae NOT DETECTED NOT DETECTED Final   Streptococcus pyogenes NOT DETECTED NOT DETECTED Final   Acinetobacter baumannii NOT DETECTED NOT DETECTED Final   Enterobacteriaceae species NOT DETECTED NOT DETECTED Final   Enterobacter cloacae  complex NOT DETECTED NOT DETECTED Final   Escherichia coli NOT DETECTED NOT DETECTED Final   Klebsiella oxytoca NOT DETECTED NOT DETECTED Final   Klebsiella pneumoniae NOT DETECTED NOT DETECTED Final   Proteus species NOT DETECTED NOT DETECTED Final   Serratia marcescens NOT DETECTED NOT DETECTED Final   Carbapenem resistance NOT DETECTED NOT DETECTED Final   Haemophilus influenzae  NOT DETECTED NOT DETECTED Final   Neisseria meningitidis NOT DETECTED NOT DETECTED Final   Pseudomonas aeruginosa NOT DETECTED NOT DETECTED Final   Candida albicans NOT DETECTED NOT DETECTED Final   Candida glabrata NOT DETECTED NOT DETECTED Final   Candida krusei NOT DETECTED NOT DETECTED Final   Candida parapsilosis NOT DETECTED NOT DETECTED Final   Candida tropicalis NOT DETECTED NOT DETECTED Final    Comment: Performed at [facility]  Culture, blood (Routine X 2)     Status: Abnormal   Collection Time: 06/29/16  8:47 AM  Result Value Ref Range Status   Specimen Description BLOOD LEFT HAND  Final   Special Requests BOTTLES DRAWN AEROBIC AND ANAEROBIC 5CC  Final   Culture  Setup Time   Final    GRAM POSITIVE COCCI IN CLUSTERS IN BOTH AEROBIC AND ANAEROBIC BOTTLES CRITICAL VALUE NOTED.  VALUE IS CONSISTENT WITH PREVIOUSLY REPORTED AND CALLED VALUE.    Culture (A)  Final    STAPHYLOCOCCUS SPECIES (COAGULASE NEGATIVE) SUSCEPTIBILITIES PERFORMED ON PREVIOUS CULTURE WITHIN THE LAST 5 DAYS. Performed at [facility]    Report Status 07/03/2016 FINAL  Final  Wound or Superficial Culture     Status: None (Preliminary result)   Collection Time: 06/29/16  8:47 AM  Result Value Ref Range Status   Specimen Description WOUND RIGHT KNEE  Final   Special Requests Normal  Final   Gram Stain   Final    FEW WBC PRESENT, PREDOMINANTLY PMN FEW GRAM POSITIVE COCCI IN PAIRS    Culture   Final    MODERATE STAPHYLOCOCCUS SPECIES (COAGULASE NEGATIVE) MODERATE YEAST IDENTIFICATION TO FOLLOW Performed at  [facility]    Report Status PENDING  Incomplete   Organism ID, Bacteria STAPHYLOCOCCUS SPECIES (COAGULASE NEGATIVE)  Final      Susceptibility   Staphylococcus species (coagulase negative) - MIC*    CIPROFLOXACIN 2 INTERMEDIATE Intermediate     ERYTHROMYCIN <=0.25 SENSITIVE Sensitive     GENTAMICIN 1 SENSITIVE Sensitive     OXACILLIN >=4 RESISTANT Resistant     TETRACYCLINE <=1 SENSITIVE Sensitive     VANCOMYCIN <=0.5 SENSITIVE Sensitive     TRIMETH/SULFA <=10 SENSITIVE Sensitive     CLINDAMYCIN <=0.25 SENSITIVE Sensitive     RIFAMPIN <=0.5 SENSITIVE Sensitive     Inducible Clindamycin NEGATIVE Sensitive     * MODERATE STAPHYLOCOCCUS SPECIES (COAGULASE NEGATIVE)  Urine C&S     Status: Abnormal   Collection Time: 06/29/16  9:07 AM  Result Value Ref Range Status   Specimen Description URINE, CLEAN CATCH  Final   Special Requests Normal  Final   Culture 40,000 COLONIES/mL ENTEROBACTER AEROGENES (A)  Final   Report Status 07/01/2016 FINAL  Final   Organism ID, Bacteria ENTEROBACTER AEROGENES (A)  Final      Susceptibility   Enterobacter aerogenes - MIC*    CEFAZOLIN >=64 RESISTANT Resistant     CEFTRIAXONE >=64 RESISTANT Resistant     CIPROFLOXACIN <=0.25 SENSITIVE Sensitive     GENTAMICIN <=1 SENSITIVE Sensitive     IMIPENEM <=0.25 SENSITIVE Sensitive     NITROFURANTOIN 64 INTERMEDIATE Intermediate     TRIMETH/SULFA <=20 SENSITIVE Sensitive     PIP/TAZO >=128 RESISTANT Resistant     * 40,000 COLONIES/mL ENTEROBACTER AEROGENES  MRSA PCR Screening     Status: None   Collection Time: 06/29/16  1:27 PM  Result Value Ref Range Status   MRSA by PCR NEGATIVE NEGATIVE Final    Comment:  The GeneXpert MRSA Assay (FDA approved for NASAL specimens only), is one component of a comprehensive MRSA colonization surveillance program. It is not intended to diagnose MRSA infection nor to guide or monitor treatment for MRSA infections.   Culture, blood (Routine X 2) w  Reflex to ID Panel     Status: None (Preliminary result)   Collection Time: 06/30/16 12:37 PM  Result Value Ref Range Status   Specimen Description BLOOD LEFT ARM  Final   Special Requests BOTTLES DRAWN AEROBIC ONLY 5CC  Final   Culture   Final    NO GROWTH 2 DAYS Performed at [facility]    Report Status PENDING  Incomplete  Culture, blood (Routine X 2) w Reflex to ID Panel     Status: Abnormal   Collection Time: 06/30/16 12:41 PM  Result Value Ref Range Status   Specimen Description BLOOD LEFT HAND  Final   Special Requests IN PEDIATRIC BOTTLE 2CC  Final   Culture  Setup Time   Final    GRAM POSITIVE COCCI IN CLUSTERS AEROBIC BOTTLE ONLY CRITICAL RESULT CALLED TO, READ BACK BY AND VERIFIED WITH: T. PICKERING RPH AT 1209 07/01/16 BY D. VANHOOK    Culture (A)  Final    STAPHYLOCOCCUS SPECIES (COAGULASE NEGATIVE) SUSCEPTIBILITIES PERFORMED ON PREVIOUS CULTURE WITHIN THE LAST 5 DAYS. Performed at [facility]    Report Status 07/03/2016 FINAL  Final         Radiology Studies: No results found.    Scheduled Meds: . aspirin  81 mg Oral Daily  . calcium-vitamin D  1 tablet Oral BID  . carbidopa-levodopa  2 tablet Oral 4 times per day  . Chlorhexidine Gluconate Cloth  6 each Topical Once  . diclofenac  75 mg Oral BID AC  . enoxaparin (LOVENOX) injection  40 mg Subcutaneous Q24H  . feeding supplement (ENSURE ENLIVE)  237 mL Oral TID BM  . finasteride  5 mg Oral Daily  . insulin aspart  0-20 Units Subcutaneous TID WC  . insulin aspart  0-5 Units Subcutaneous QHS  . insulin glargine  20 Units Subcutaneous Daily  . INTEGRA PLUS  1 capsule Oral q morning - 10a  . linagliptin  5 mg Oral Daily  . mirabegron ER  50 mg Oral Daily  . multivitamin with minerals  1 tablet Oral Daily  . pantoprazole  40 mg Oral BID  . pioglitazone  45 mg Oral Daily  . rosuvastatin  10 mg Oral Daily  . saccharomyces boulardii  250 mg Oral BID  . tamsulosin  0.4 mg Oral QPC breakfast   . vitamin A  10,000 Units Oral Daily  . zinc sulfate  220 mg Oral Daily   Continuous Infusions:     LOS: 4 days    Time spent in minutes: [place], MD [facility] Pager: [email or web address] Password [facility] 07/03/2016, 2:33 PM

## 2016-07-03 NOTE — Progress Notes (Signed)
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  Right knee contains hardware.  Pharmacy has been consulted for vancomycin and Zosyn dosing.  First doses of vanc 1g and zosyn 3.375g ordered for ED.  Today, 07/03/2016 Dose adjustments this admission: 7/15 trough at 0900 = 32 mcg/ml on Vancomycin 1g IV q12h. Note that trough drawn at 0901, results still pending at 11:45 - call placed to lab, sample not yet in process, but will be completed shortly.  Trough elevated, unfortunately the 10am Vancomycin 1gm dose given  Plan: Hold Vancomycin for now Random Vancomycin level in am Re-dose based on further levels  Height: 5\' 7"  (170.2 cm) Weight: 173 lb 15.1 oz (78.9 kg) IBW/kg (Calculated) : 66.1  Temp (24hrs), Avg:97.7 F (36.5 C), Min:97.6 F (36.4 C), Max:97.8 F (36.6 C)   Recent Labs Lab 06/29/16 0847 06/29/16 0853 06/30/16 0357 07/01/16 0445 07/02/16 0440 07/03/16 0423  WBC 8.2  --  10.2  --  9.3 10.0  CREATININE 0.80  --  0.74 0.72 0.78 0.86  LATICACIDVEN  --  1.00  --   --   --   --     Estimated Creatinine Clearance: 55.5 mL/min (by C-G formula based on Cr of 0.86).    Allergies  Allergen Reactions  . Ace Inhibitors Other (See Comments)    cough  . Succinylcholine Other (See Comments)    Prolonged sedation  . Morphine And Related Nausea And Vomiting   Antimicrobials this admission: 7/11 Vancomycin >>  7/11 Zosyn >> 7/12  Microbiology results: 7/11 BCx: 2/2 gram stain GPC/clusters, but BCID neg  resulted CoNS - Oxacillin Res 7/11 UCx: 40K enterobacter aerogenes (S to cipro, gent, imi, bactrim) 7/11 Wound cx R knee: few GPC (re-incubated) 7/11 MRSA: neg (has been pos in past) 7/12 BCx: GPC clusters 1/2 (BCID wont be run on this since same morphology as previous blood cx) 7/14 rpt BCx: sent (as long as this remains neg, OK for  PICC)   Thank you for allowing pharmacy to be a part of this patient's care.  Minda Ditto PharmD Pager [phone] 07/03/2016, 12:17 PM

## 2016-07-04 DIAGNOSIS — B379 Candidiasis, unspecified: Secondary | ICD-10-CM | POA: Insufficient documentation

## 2016-07-04 DIAGNOSIS — T826XXA Infection and inflammatory reaction due to cardiac valve prosthesis, initial encounter: Secondary | ICD-10-CM

## 2016-07-04 DIAGNOSIS — Y712 Prosthetic and other implants, materials and accessory cardiovascular devices associated with adverse incidents: Secondary | ICD-10-CM

## 2016-07-04 DIAGNOSIS — I330 Acute and subacute infective endocarditis: Secondary | ICD-10-CM

## 2016-07-04 DIAGNOSIS — R6521 Severe sepsis with septic shock: Secondary | ICD-10-CM

## 2016-07-04 DIAGNOSIS — R7881 Bacteremia: Secondary | ICD-10-CM | POA: Insufficient documentation

## 2016-07-04 DIAGNOSIS — A419 Sepsis, unspecified organism: Secondary | ICD-10-CM | POA: Insufficient documentation

## 2016-07-04 DIAGNOSIS — Z96651 Presence of right artificial knee joint: Secondary | ICD-10-CM

## 2016-07-04 DIAGNOSIS — S81801A Unspecified open wound, right lower leg, initial encounter: Secondary | ICD-10-CM | POA: Insufficient documentation

## 2016-07-04 DIAGNOSIS — L97819 Non-pressure chronic ulcer of other part of right lower leg with unspecified severity: Secondary | ICD-10-CM

## 2016-07-04 DIAGNOSIS — B957 Other staphylococcus as the cause of diseases classified elsewhere: Secondary | ICD-10-CM | POA: Insufficient documentation

## 2016-07-04 DIAGNOSIS — I38 Endocarditis, valve unspecified: Secondary | ICD-10-CM

## 2016-07-04 LAB — CBC
HEMATOCRIT: 27.2 % — AB (ref 39.0–52.0)
HEMOGLOBIN: 9.1 g/dL — AB (ref 13.0–17.0)
MCH: 32 pg (ref 26.0–34.0)
MCHC: 33.5 g/dL (ref 30.0–36.0)
MCV: 95.8 fL (ref 78.0–100.0)
Platelets: 298 10*3/uL (ref 150–400)
RBC: 2.84 MIL/uL — AB (ref 4.22–5.81)
RDW: 14.7 % (ref 11.5–15.5)
WBC: 9.4 10*3/uL (ref 4.0–10.5)

## 2016-07-04 LAB — BASIC METABOLIC PANEL
ANION GAP: 7 (ref 5–15)
BUN: 28 mg/dL — ABNORMAL HIGH (ref 6–20)
CALCIUM: 8.4 mg/dL — AB (ref 8.9–10.3)
CHLORIDE: 99 mmol/L — AB (ref 101–111)
CO2: 27 mmol/L (ref 22–32)
Creatinine, Ser: 0.82 mg/dL (ref 0.61–1.24)
GFR calc non Af Amer: >60 mL/min (ref >60)
Glucose, Bld: 226 mg/dL — ABNORMAL HIGH (ref 65–99)
Potassium: 4.5 mmol/L (ref 3.5–5.1)
SODIUM: 133 mmol/L — AB (ref 135–145)

## 2016-07-04 LAB — GLUCOSE, CAPILLARY
GLUCOSE-CAPILLARY: 366 mg/dL — AB (ref 65–99)
GLUCOSE-CAPILLARY: 212 mg/dL — AB (ref 65–99)
Glucose-Capillary: 229 mg/dL — ABNORMAL HIGH (ref 65–99)
Glucose-Capillary: 234 mg/dL — ABNORMAL HIGH (ref 65–99)

## 2016-07-04 LAB — VANCOMYCIN, TROUGH: Vancomycin Tr: 25 ug/mL (ref 15–20)

## 2016-07-04 MED ORDER — FLUCONAZOLE IN SODIUM CHLORIDE 200-0.9 MG/100ML-% IV SOLN
200.0000 mg | INTRAVENOUS | Status: DC
  Administered 2016-07-04 – 2016-07-08 (×5): 200 mg via INTRAVENOUS
  Filled 2016-07-04 (×6): qty 100

## 2016-07-04 MED ORDER — INSULIN GLARGINE 100 UNIT/ML ~~LOC~~ SOLN
26.0000 [IU] | Freq: Every day | SUBCUTANEOUS | Status: DC
  Administered 2016-07-04 – 2016-07-05 (×2): 26 [IU] via SUBCUTANEOUS
  Filled 2016-07-04 (×2): qty 0.26

## 2016-07-04 NOTE — Progress Notes (Signed)
Subjective: No new complaints   Antibiotics:  Anti-infectives    Start     Dose/Rate Route Frequency Ordered Stop   07/04/16 1600  fluconazole (DIFLUCAN) IVPB 400 mg  Status:  Discontinued     400 mg 100 mL/hr over 120 Minutes Intravenous Every 24 hours 07/03/16 1606 07/04/16 1157   07/04/16 1600  fluconazole (DIFLUCAN) IVPB 200 mg     200 mg 100 mL/hr over 60 Minutes Intravenous Every 24 hours 07/04/16 1157     07/03/16 1700  fluconazole (DIFLUCAN) IVPB 800 mg     800 mg 100 mL/hr over 240 Minutes Intravenous  Once 07/03/16 1606 07/03/16 2120   06/29/16 2200  vancomycin (VANCOCIN) IVPB 1000 mg/200 mL premix  Status:  Discontinued     1,000 mg 200 mL/hr over 60 Minutes Intravenous Every 12 hours 06/29/16 0939 07/03/16 1218   06/29/16 1600  piperacillin-tazobactam (ZOSYN) IVPB 3.375 g  Status:  Discontinued     3.375 g 12.5 mL/hr over 240 Minutes Intravenous Every 8 hours 06/29/16 0940 07/01/16 1335   06/29/16 0915  piperacillin-tazobactam (ZOSYN) IVPB 3.375 g     3.375 g 100 mL/hr over 30 Minutes Intravenous  Once 06/29/16 0911 06/29/16 0950   06/29/16 0915  vancomycin (VANCOCIN) IVPB 1000 mg/200 mL premix     1,000 mg 200 mL/hr over 60 Minutes Intravenous  Once 06/29/16 0912 06/29/16 1124      Medications: Scheduled Meds: . aspirin  81 mg Oral Daily  . calcium-vitamin D  1 tablet Oral BID  . carbidopa-levodopa  2 tablet Oral 4 times per day  . Chlorhexidine Gluconate Cloth  6 each Topical Once  . diclofenac  75 mg Oral BID AC  . enoxaparin (LOVENOX) injection  40 mg Subcutaneous Q24H  . feeding supplement (ENSURE ENLIVE)  237 mL Oral TID BM  . finasteride  5 mg Oral Daily  . fluconazole (DIFLUCAN) IV  200 mg Intravenous Q24H  . insulin aspart  0-20 Units Subcutaneous TID WC  . insulin aspart  0-5 Units Subcutaneous QHS  . insulin glargine  26 Units Subcutaneous Daily  . INTEGRA PLUS  1 capsule Oral q morning - 10a  . linagliptin  5 mg Oral Daily  .  mirabegron ER  50 mg Oral Daily  . multivitamin with minerals  1 tablet Oral Daily  . pantoprazole  40 mg Oral BID  . pioglitazone  45 mg Oral Daily  . rosuvastatin  10 mg Oral Daily  . saccharomyces boulardii  250 mg Oral BID  . tamsulosin  0.4 mg Oral QPC breakfast  . vitamin A  10,000 Units Oral Daily  . zinc sulfate  220 mg Oral Daily   Continuous Infusions:  PRN Meds:.acetaminophen **OR** acetaminophen, alum & mag hydroxide-simeth, HYDROcodone-acetaminophen, LORazepam, prochlorperazine, traMADol, zolpidem    Objective: Weight change:   Intake/Output Summary (Last 24 hours) at 07/04/16 1524 Last data filed at 07/04/16 0900  Gross per 24 hour  Intake    750 ml  Output   1100 ml  Net   -350 ml   Blood pressure 125/55, pulse 49, temperature 98 F (36.7 C), temperature source Oral, resp. rate 16, height 5\' 7"  (1.702 m), weight 173 lb 15.1 oz (78.9 kg), SpO2 90 %. Temp:  [98 F (36.7 C)-98.1 F (36.7 C)] 98 F (36.7 C) (07/16 [id number]) Pulse Rate:  [49-59] 49 (07/16 0608) Resp:  [16-18] 16 (07/16 0608) BP: (125-146)/(53-55) 125/55 mmHg (07/16 0608) SpO2:  [90 %-  97 %] 90 % (07/16 [id number])  Physical Exam: General: Alert and awake, oriented and talking to his wife on phone. He recognized me from before (he has accompanied his wife to appts to see me in the past)  HEENT: anicteric sclera, pupils reactive to light and accommodation, EOMI CVS regular rate, normal r,  no murmur rubs or gallops Chest: clear to auscultation bilaterally, no wheezing, rales or rhonchi Abdomen: soft nontender, nondistended Extremities/skin:  07/04/16: Knee with ulcers, not hot or esp tender in the jonit:        Neuro: nonfocal  CBC: CBC Latest Ref Rng 07/04/2016 07/03/2016 07/02/2016  WBC 4.0 - 10.5 K/uL 9.4 10.0 9.3  Hemoglobin 13.0 - 17.0 g/dL 9.1(L) 9.7(L) 8.9(L)  Hematocrit 39.0 - 52.0 % 27.2(L) 28.9(L) 26.5(L)  Platelets 150 - 400 K/uL 298 263 178      BMET  Recent Labs   07/03/16 0423 07/04/16 0839  NA 135 133*  K 4.4 4.5  CL 101 99*  CO2 27 27  GLUCOSE 240* 226*  BUN 32* 28*  CREATININE 0.86 0.82  CALCIUM 8.8* 8.4*     Liver Panel  No results for input(s): PROT, ALBUMIN, AST, ALT, ALKPHOS, BILITOT, BILIDIR, IBILI in the last 72 hours.     Sedimentation Rate No results for input(s): ESRSEDRATE in the last 72 hours. C-Reactive Protein No results for input(s): CRP in the last 72 hours.  Micro Results: Recent Results (from the past 720 hour(s))  Culture, blood (Routine X 2)     Status: Abnormal   Collection Time: 06/29/16  8:45 AM  Result Value Ref Range Status   Specimen Description BLOOD RIGHT ANTECUBITAL  Final   Special Requests BOTTLES DRAWN AEROBIC AND ANAEROBIC 5ML  Final   Culture  Setup Time   Final    GRAM POSITIVE COCCI IN CLUSTERS IN BOTH AEROBIC AND ANAEROBIC BOTTLES CRITICAL RESULT CALLED TO, READ BACK BY AND VERIFIED WITH: MLT T.MULLINS [id number] @0710  MLM CRITICAL RESULT CALLED TO, READ BACK BY AND VERIFIED WITH: SEAY,K. RN AT GS:[id number] 06/30/16 MULLINS,T Performed at [facility]    Culture STAPHYLOCOCCUS SPECIES (COAGULASE NEGATIVE) (A)  Final   Report Status 07/02/2016 FINAL  Final   Organism ID, Bacteria STAPHYLOCOCCUS SPECIES (COAGULASE NEGATIVE)  Final      Susceptibility   Staphylococcus species (coagulase negative) - MIC*    CIPROFLOXACIN 2 INTERMEDIATE Intermediate     ERYTHROMYCIN <=0.25 SENSITIVE Sensitive     GENTAMICIN 1 SENSITIVE Sensitive     OXACILLIN >=4 RESISTANT Resistant     TETRACYCLINE <=1 SENSITIVE Sensitive     VANCOMYCIN <=0.5 SENSITIVE Sensitive     TRIMETH/SULFA <=10 SENSITIVE Sensitive     CLINDAMYCIN <=0.25 SENSITIVE Sensitive     RIFAMPIN <=0.5 SENSITIVE Sensitive     Inducible Clindamycin NEGATIVE Sensitive     * STAPHYLOCOCCUS SPECIES (COAGULASE NEGATIVE)  Blood Culture ID Panel (Reflexed)     Status: None   Collection Time: 06/29/16  8:45 AM  Result Value Ref Range Status    Enterococcus species NOT DETECTED NOT DETECTED Final   Vancomycin resistance NOT DETECTED NOT DETECTED Final   Listeria monocytogenes NOT DETECTED NOT DETECTED Final   Staphylococcus species NOT DETECTED NOT DETECTED Final   Staphylococcus aureus NOT DETECTED NOT DETECTED Final   Methicillin resistance NOT DETECTED NOT DETECTED Final   Streptococcus species NOT DETECTED NOT DETECTED Final   Streptococcus agalactiae NOT DETECTED NOT DETECTED Final   Streptococcus pneumoniae NOT DETECTED NOT DETECTED Final  Streptococcus pyogenes NOT DETECTED NOT DETECTED Final   Acinetobacter baumannii NOT DETECTED NOT DETECTED Final   Enterobacteriaceae species NOT DETECTED NOT DETECTED Final   Enterobacter cloacae complex NOT DETECTED NOT DETECTED Final   Escherichia coli NOT DETECTED NOT DETECTED Final   Klebsiella oxytoca NOT DETECTED NOT DETECTED Final   Klebsiella pneumoniae NOT DETECTED NOT DETECTED Final   Proteus species NOT DETECTED NOT DETECTED Final   Serratia marcescens NOT DETECTED NOT DETECTED Final   Carbapenem resistance NOT DETECTED NOT DETECTED Final   Haemophilus influenzae NOT DETECTED NOT DETECTED Final   Neisseria meningitidis NOT DETECTED NOT DETECTED Final   Pseudomonas aeruginosa NOT DETECTED NOT DETECTED Final   Candida albicans NOT DETECTED NOT DETECTED Final   Candida glabrata NOT DETECTED NOT DETECTED Final   Candida krusei NOT DETECTED NOT DETECTED Final   Candida parapsilosis NOT DETECTED NOT DETECTED Final   Candida tropicalis NOT DETECTED NOT DETECTED Final    Comment: Performed at [facility]  Culture, blood (Routine X 2)     Status: Abnormal   Collection Time: 06/29/16  8:47 AM  Result Value Ref Range Status   Specimen Description BLOOD LEFT HAND  Final   Special Requests BOTTLES DRAWN AEROBIC AND ANAEROBIC 5CC  Final   Culture  Setup Time   Final    GRAM POSITIVE COCCI IN CLUSTERS IN BOTH AEROBIC AND ANAEROBIC BOTTLES CRITICAL VALUE NOTED.  VALUE IS  CONSISTENT WITH PREVIOUSLY REPORTED AND CALLED VALUE.    Culture (A)  Final    STAPHYLOCOCCUS SPECIES (COAGULASE NEGATIVE) SUSCEPTIBILITIES PERFORMED ON PREVIOUS CULTURE WITHIN THE LAST 5 DAYS. Performed at [facility]    Report Status 07/03/2016 FINAL  Final  Wound or Superficial Culture     Status: None   Collection Time: 06/29/16  8:47 AM  Result Value Ref Range Status   Specimen Description WOUND RIGHT KNEE  Final   Special Requests Normal  Final   Gram Stain   Final    FEW WBC PRESENT, PREDOMINANTLY PMN FEW GRAM POSITIVE COCCI IN PAIRS Performed at [facility]    Culture   Final    MODERATE STAPHYLOCOCCUS SPECIES (COAGULASE NEGATIVE) MODERATE CANDIDA ALBICANS    Report Status 07/03/2016 FINAL  Final   Organism ID, Bacteria STAPHYLOCOCCUS SPECIES (COAGULASE NEGATIVE)  Final      Susceptibility   Staphylococcus species (coagulase negative) - MIC*    CIPROFLOXACIN 2 INTERMEDIATE Intermediate     ERYTHROMYCIN <=0.25 SENSITIVE Sensitive     GENTAMICIN 1 SENSITIVE Sensitive     OXACILLIN >=4 RESISTANT Resistant     TETRACYCLINE <=1 SENSITIVE Sensitive     VANCOMYCIN <=0.5 SENSITIVE Sensitive     TRIMETH/SULFA <=10 SENSITIVE Sensitive     CLINDAMYCIN <=0.25 SENSITIVE Sensitive     RIFAMPIN <=0.5 SENSITIVE Sensitive     Inducible Clindamycin NEGATIVE Sensitive     * MODERATE STAPHYLOCOCCUS SPECIES (COAGULASE NEGATIVE)  Urine C&S     Status: Abnormal   Collection Time: 06/29/16  9:07 AM  Result Value Ref Range Status   Specimen Description URINE, CLEAN CATCH  Final   Special Requests Normal  Final   Culture 40,000 COLONIES/mL ENTEROBACTER AEROGENES (A)  Final   Report Status 07/01/2016 FINAL  Final   Organism ID, Bacteria ENTEROBACTER AEROGENES (A)  Final      Susceptibility   Enterobacter aerogenes - MIC*    CEFAZOLIN >=64 RESISTANT Resistant     CEFTRIAXONE >=64 RESISTANT Resistant     CIPROFLOXACIN <=0.25  SENSITIVE Sensitive     GENTAMICIN <=1  SENSITIVE Sensitive     IMIPENEM <=0.25 SENSITIVE Sensitive     NITROFURANTOIN 64 INTERMEDIATE Intermediate     TRIMETH/SULFA <=20 SENSITIVE Sensitive     PIP/TAZO >=128 RESISTANT Resistant     * 40,000 COLONIES/mL ENTEROBACTER AEROGENES  MRSA PCR Screening     Status: None   Collection Time: 06/29/16  1:27 PM  Result Value Ref Range Status   MRSA by PCR NEGATIVE NEGATIVE Final    Comment:        The GeneXpert MRSA Assay (FDA approved for NASAL specimens only), is one component of a comprehensive MRSA colonization surveillance program. It is not intended to diagnose MRSA infection nor to guide or monitor treatment for MRSA infections.   Culture, blood (Routine X 2) w Reflex to ID Panel     Status: None (Preliminary result)   Collection Time: 06/30/16 12:37 PM  Result Value Ref Range Status   Specimen Description BLOOD LEFT ARM  Final   Special Requests BOTTLES DRAWN AEROBIC ONLY 5CC  Final   Culture   Final    NO GROWTH 3 DAYS Performed at [facility]    Report Status PENDING  Incomplete  Culture, blood (Routine X 2) w Reflex to ID Panel     Status: Abnormal   Collection Time: 06/30/16 12:41 PM  Result Value Ref Range Status   Specimen Description BLOOD LEFT HAND  Final   Special Requests IN PEDIATRIC BOTTLE 2CC  Final   Culture  Setup Time   Final    GRAM POSITIVE COCCI IN CLUSTERS AEROBIC BOTTLE ONLY CRITICAL RESULT CALLED TO, READ BACK BY AND VERIFIED WITH: T. PICKERING RPH AT 1209 07/01/16 BY D. VANHOOK    Culture (A)  Final    STAPHYLOCOCCUS SPECIES (COAGULASE NEGATIVE) SUSCEPTIBILITIES PERFORMED ON PREVIOUS CULTURE WITHIN THE LAST 5 DAYS. Performed at [facility]    Report Status 07/03/2016 FINAL  Final  Culture, blood (Routine X 2) w Reflex to ID Panel     Status: None (Preliminary result)   Collection Time: 07/02/16  7:56 AM  Result Value Ref Range Status   Specimen Description BLOOD LEFT ANTECUBITAL  Final   Special Requests BOTTLES DRAWN  AEROBIC AND ANAEROBIC 10CC  Final   Culture   Final    NO GROWTH 1 DAY Performed at [facility]    Report Status PENDING  Incomplete    Studies/Results: No results found.    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 Gladstone Lighter did not think joint infected and I discussed with Orthopedics today and Merla Riches examined the patient again and does not think PJ is infected. I agree but I had anxiety when I heard about his bacteremia and initial report of fungus from joint fluid culture  #3 Wounds: Significance of the candida from a superficial wound culture is doubtful. I will reduce his fluconazole dose to 10 mg and would give him a short 7 day course. He is to go to the operating room with Dr. Migdalia Dk tomorrow.   I spent greater than 35 minutes with the patient including greater than 50% of time in face to face counsel of the patient his Coag Neg Staph bacteremia,  PV, PJ wounds and in coordination of his care.  Dr. Johnnye Sima will pick up the surface tomorrow.    LOS: 5 days   Alcide Evener 07/04/2016, 3:24 PM

## 2016-07-04 NOTE — Progress Notes (Signed)
   Subjective:   Procedure(s) (LRB): IRRIGATION AND DEBRIDEMENT WOUND OR RIGHT KNEE WOUND (Right) APPLICATION OF A-CELL OF EXTREMITY (Right)  Asked by Dr. Tommy Medal to check right leg wounds and right knee Pt c/o minimal to no pain in the knee and it has been feeling better after the past 2 days Dressing changes have been completed daily Patient reports pain as mild.  Objective:   VITALS:   Filed Vitals:   07/03/16 2200 07/04/16 0608  BP: 146/53 125/55  Pulse: 59 49  Temp: 98.1 F (36.7 C) 98 F (36.7 C)  Resp: 18 16    2  wounds to anterior right knee Proximal wound 3 cm in length with no signs of erthema Mild drainage from proximal wound but no purulence Distal wound about 3 cm in length but much more superficial Minimal drainage but no erythema or purulence Gentle rom without pain No signs of septic knee joint and no signs of effusion nv intact distally  LABS  Recent Labs  07/02/16 0440 07/03/16 0423 07/04/16 0839  HGB 8.9* 9.7* 9.1*  HCT 26.5* 28.9* 27.2*  WBC 9.3 10.0 9.4  PLT 178 263 298     Recent Labs  07/02/16 0440 07/03/16 0423 07/04/16 0839  NA 133* 135 133*  K 4.5 4.4 4.5  BUN 31* 32* 28*  CREATININE 0.78 0.86 0.82  GLUCOSE 276* 240* 226*     Assessment/Plan:   Procedure(s) (LRB): IRRIGATION AND DEBRIDEMENT WOUND OR RIGHT KNEE WOUND (Right) APPLICATION OF A-CELL OF EXTREMITY (Right) Currently there are no signs of sepsis to right knee affecting the total joint Recommend proceed with surgery from plastics Will continue to monitor his progress Dressing changed today during the visit   Merla Riches, MPAS, PA-C  07/04/2016, 12:53 PM

## 2016-07-04 NOTE — Progress Notes (Signed)
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  Right knee contains hardware.  Pharmacy has been consulted for vancomycin and Zosyn dosing.  First doses of vanc 1g and zosyn 3.375g ordered for ED.  Today, 07/04/2016 Dose adjustments this admission: 7/15 trough at 0900 = 32 mcg/ml on Vancomycin 1g IV q12h. Note that trough drawn at 0901, results still pending at 11:45 - call placed to lab, sample not yet in process, but will be completed shortly.  Trough elevated, unfortunately the 10am Vancomycin 1gm dose given 7/16: repeat Vanc level 25 mcg/ml this am  Plan: Continue to hold Vancomycin  Random Vancomycin level in am Re-dose based on further levels Fluconazole 800mg  x1 7/16, then 400mg  IV daily/ID recs for 7/11 wound cx growing Candida albicans  Height: 5\' 7"  (170.2 cm) Weight: 173 lb 15.1 oz (78.9 kg) IBW/kg (Calculated) : 66.1  Temp (24hrs), Avg:98.2 F (36.8 C), Min:98 F (36.7 C), Max:98.5 F (36.9 C)   Recent Labs Lab 06/29/16 0847 06/29/16 0853 06/30/16 0357 07/01/16 0445 07/02/16 0440 07/03/16 0423 07/03/16 0901 07/04/16 0435 07/04/16 0839  WBC 8.2  --  10.2  --  9.3 10.0  --   --  9.4  CREATININE 0.80  --  0.74 0.72 0.78 0.86  --   --  0.82  LATICACIDVEN  --  1.00  --   --   --   --   --   --   --   VANCOTROUGH  --   --   --   --   --   --  32* 25*  --     Estimated Creatinine Clearance: 58.2 mL/min (by C-G formula based on Cr of 0.82).    Allergies  Allergen Reactions  . Ace Inhibitors Other (See Comments)    cough  . Succinylcholine Other (See Comments)    Prolonged sedation  . Morphine And Related Nausea And Vomiting   Antimicrobials this admission: 7/11 Vancomycin >>  held 7/15 >> 7/11 Zosyn >>  7/12 7/15 Fluconazole >>  Microbiology results:  7/11 BCx: 2/2 gram stain GPC/clusters, but BCID neg  resulted CoNS -  Oxacillin Res 7/11 UCx: 40K enterobacter aerogenes (S to cipro, gent, imi, bactrim) 7/11 Wound cx R knee: MR-CoNS and C. albicans 7/11 MRSA: neg (has been pos in past) 7/12 BCx: GPC clusters 1/2 (BCID wont be run on this since same morphology as previous blood cx) 7/14 rpt BCx: sent (as long as this remains neg, OK for PICC)  Thank you for allowing pharmacy to be a part of this patient's care.  Minda Ditto PharmD Pager [phone] 07/04/2016, 10:37 AM

## 2016-07-04 NOTE — Progress Notes (Signed)
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 He has been on keflex for the wound. He presents with confusion, fever, increased drainage and incontinence. He has been having frequent falls.   Subjective: Feels the best he has felt in a while. No new complaints.   Assessment & Plan:   Principal Problem:   Sepsis - fever 101, confusion, knee wound and blood cultures growing coag neg staph- repeat cultures on 7/12 also postiive 1/ 2 sets - third set of blood cultures ordered today- if negative, she will need PICC - ECHO > no vegetations noted- recommended TEE- I have contacted cardiology - ID managing antibiotics - spoke with plastic surgery - patient will go to or for I and D on Monday  Active Problems:  Right knee dislocation- prosthetic knee - s/p closed reduction x 2  - superficial wound infection- ortho and plastic surgery eval- see above   Diabetes mellitus without complication  - sugars elevated despite poor PO intake- recently received epidural steroid about 1 month ago - resumed Actos and Januvia - increased SSI- added Lantus - will increase Lantus- he is drinking Ensure TID between meals which he prefers over Glucerna and this is exacerbating his sugars further -will be giving 16 U Novolog now at dinner - A1c 7.6 in 3/17- no need to recheck at this admission-  - diabetic diet  Obstructive sleep apnea CPAP    CAD (coronary artery disease) of artery bypass graft - Aspirin  Anemia -  h/o Iron deficiency anemia followed by Dr Julien Nordmann - cont oral iron    S/P AVR    Chronic systolic CHF (congestive heart failure) - stopped IVF  - following I and O    Parkinson's disease -  Sinemet  BPH/ bladder issues? - on Proscar, Flomax and Myrbetriq   DVT prophylaxis: Lovenox Code Status: full code Family Communication:  Disposition Plan:  Consultants:   ID, ortho, plastic surgery Procedures:  2 D ECHO 07/01/16 Study Conclusions  - Left ventricle: The cavity size was normal. Wall thickness was  increased in a pattern of moderate LVH. Systolic function was  normal. The estimated ejection fraction was in the range of 50%  to 55%. Although no diagnostic regional wall motion abnormality  was identified, this possibility cannot be completely excluded on  the basis of this study. - Aortic valve: A bioprosthesis was present and functioning  normally. - Aortic root: The aortic root was mildly dilated. - Mitral valve: There was mild regurgitation. - Left atrium: The atrium was moderately dilated. - Right ventricle: The cavity size was dilated. Wall thickness was  normal. Systolic function was mildly reduced. - Right atrium: The atrium was moderately dilated. - Tricuspid valve: There was mild-moderate regurgitation. - Pulmonary arteries: Systolic pressure was moderately increased.  PA peak pressure: 53 mm Hg (S). Antimicrobials:  Anti-infectives    Start     Dose/Rate Route Frequency Ordered Stop   07/04/16 1600  fluconazole (DIFLUCAN) IVPB 400 mg  Status:  Discontinued     400 mg 100 mL/hr over 120 Minutes Intravenous Every 24 hours 07/03/16 1606 07/04/16 1157   07/04/16 1600  fluconazole (DIFLUCAN) IVPB 200 mg     200 mg 100  mL/hr over 60 Minutes Intravenous Every 24 hours 07/04/16 1157     07/03/16 1700  fluconazole (DIFLUCAN) IVPB 800 mg     800 mg 100 mL/hr over 240 Minutes Intravenous  Once 07/03/16 1606 07/03/16 2120   06/29/16 2200  vancomycin (VANCOCIN) IVPB 1000 mg/200 mL premix  Status:  Discontinued     1,000 mg 200 mL/hr over 60 Minutes Intravenous Every 12 hours 06/29/16 0939 07/03/16 1218   06/29/16 1600  piperacillin-tazobactam (ZOSYN)  IVPB 3.375 g  Status:  Discontinued     3.375 g 12.5 mL/hr over 240 Minutes Intravenous Every 8 hours 06/29/16 0940 07/01/16 1335   06/29/16 0915  piperacillin-tazobactam (ZOSYN) IVPB 3.375 g     3.375 g 100 mL/hr over 30 Minutes Intravenous  Once 06/29/16 0911 06/29/16 0950   06/29/16 0915  vancomycin (VANCOCIN) IVPB 1000 mg/200 mL premix     1,000 mg 200 mL/hr over 60 Minutes Intravenous  Once 06/29/16 0912 06/29/16 1124       Objective: Filed Vitals:   07/03/16 0417 07/03/16 1244 07/03/16 2200 07/04/16 0608  BP: 132/67 127/42 146/53 125/55  Pulse: 108 65 59 49  Temp:  98.5 F (36.9 C) 98.1 F (36.7 C) 98 F (36.7 C)  TempSrc: Oral Oral Oral Oral  Resp: 20 20 18 16   Height:      Weight:      SpO2: 100% 95% 97% 90%    Intake/Output Summary (Last 24 hours) at 07/04/16 1458 Last data filed at 07/04/16 0900  Gross per 24 hour  Intake    750 ml  Output   1100 ml  Net   -350 ml   Filed Weights   06/29/16 0825 06/29/16 1227  Weight: 81.647 kg (180 lb) 78.9 kg (173 lb 15.1 oz)    Examination: General exam: Appears comfortable  HEENT: PERRLA, oral mucosa moist, no sclera icterus or thrush Respiratory system: Clear to auscultation. Respiratory effort normal. Cardiovascular system: S1 & S2 heard, RRR.  No murmurs  Gastrointestinal system: Abdomen soft, non-tender, nondistended. Normal bowel sound. No organomegaly Central nervous system: Alert and oriented. No focal neurological deficits. Extremities: No cyanosis, clubbing or edema Skin: ulcers on knee   Psychiatry:  Mood & affect appropriate.     Data Reviewed: I have personally reviewed following labs and imaging studies  CBC:  Recent Labs Lab 06/29/16 0847 06/30/16 0357 07/02/16 0440 07/03/16 0423 07/04/16 0839  WBC 8.2 10.2 9.3 10.0 9.4  NEUTROABS 6.7  --   --   --   --   HGB 10.5* 9.8* 8.9* 9.7* 9.1*  HCT 31.4* 29.2* 26.5* 28.9* 27.2*  MCV 95.2 95.4 94.6 94.4 95.8  PLT 164 177 178 263 [id number]   Basic  Metabolic Panel:  Recent Labs Lab 06/30/16 0357 07/01/16 0445 07/02/16 0440 07/03/16 0423 07/04/16 0839  NA 133* 134* 133* 135 133*  K 4.1 4.4 4.5 4.4 4.5  CL 103 105 103 101 99*  CO2 21* 23 24 27 27   GLUCOSE 243* 245* 276* 240* 226*  BUN 24* 21* 31* 32* 28*  CREATININE 0.74 0.72 0.78 0.86 0.82  CALCIUM 8.3* 8.2* 8.3* 8.8* 8.4*   GFR: Estimated Creatinine Clearance: 58.2 mL/min (by C-G formula based on Cr of 0.82). Liver Function Tests:  Recent Labs Lab 06/29/16 0847  AST 37  ALT 6*  ALKPHOS 78  BILITOT 0.5  PROT 6.7  ALBUMIN 3.2*   No results for input(s): LIPASE, AMYLASE in the last 168 hours. No results for input(s):  AMMONIA in the last 168 hours. Coagulation Profile: No results for input(s): INR, PROTIME in the last 168 hours. Cardiac Enzymes: No results for input(s): CKTOTAL, CKMB, CKMBINDEX, TROPONINI in the last 168 hours. BNP (last 3 results) No results for input(s): PROBNP in the last 8760 hours. HbA1C: No results for input(s): HGBA1C in the last 72 hours. CBG:  Recent Labs Lab 07/03/16 1159 07/03/16 1647 07/03/16 2200 07/04/16 0736 07/04/16 1157  GLUCAP 228* 292* 229* 229* 366*   Lipid Profile: No results for input(s): CHOL, HDL, LDLCALC, TRIG, CHOLHDL, LDLDIRECT in the last 72 hours. Thyroid Function Tests: No results for input(s): TSH, T4TOTAL, FREET4, T3FREE, THYROIDAB in the last 72 hours. Anemia Panel:  Recent Labs  07/03/16 0423  VITAMINB12 1002*  FOLATE 45.3  FERRITIN 1910*  TIBC 176*  IRON 17*  RETICCTPCT 1.4   Urine analysis:    Component Value Date/Time   COLORURINE YELLOW 06/29/2016 0907   APPEARANCEUR CLOUDY* 06/29/2016 0907   LABSPEC 1.026 06/29/2016 0907   PHURINE 5.5 06/29/2016 0907   GLUCOSEU 100* 06/29/2016 0907   HGBUR NEGATIVE 06/29/2016 0907   HGBUR negative 02/25/2010 0743   BILIRUBINUR NEGATIVE 06/29/2016 0907   BILIRUBINUR n 02/25/2016 1237   KETONESUR NEGATIVE 06/29/2016 0907   PROTEINUR 100*  06/29/2016 0907   PROTEINUR 1+ 02/25/2016 1237   UROBILINOGEN 0.2 02/25/2016 1237   UROBILINOGEN 0.2 08/20/2010 1651   NITRITE NEGATIVE 06/29/2016 0907   NITRITE n 02/25/2016 1237   LEUKOCYTESUR NEGATIVE 06/29/2016 0907   Sepsis Labs: @LABRCNTIP (procalcitonin:4,lacticidven:4) ) Recent Results (from the past 240 hour(s))  Culture, blood (Routine X 2)     Status: Abnormal   Collection Time: 06/29/16  8:45 AM  Result Value Ref Range Status   Specimen Description BLOOD RIGHT ANTECUBITAL  Final   Special Requests BOTTLES DRAWN AEROBIC AND ANAEROBIC 5ML  Final   Culture  Setup Time   Final    GRAM POSITIVE COCCI IN CLUSTERS IN BOTH AEROBIC AND ANAEROBIC BOTTLES CRITICAL RESULT CALLED TO, READ BACK BY AND VERIFIED WITH: MLT T.MULLINS [id number] @0710  MLM CRITICAL RESULT CALLED TO, READ BACK BY AND VERIFIED WITH: SEAY,K. RN AT [id number] 06/30/16 MULLINS,T Performed at [facility]    Culture STAPHYLOCOCCUS SPECIES (COAGULASE NEGATIVE) (A)  Final   Report Status 07/02/2016 FINAL  Final   Organism ID, Bacteria STAPHYLOCOCCUS SPECIES (COAGULASE NEGATIVE)  Final      Susceptibility   Staphylococcus species (coagulase negative) - MIC*    CIPROFLOXACIN 2 INTERMEDIATE Intermediate     ERYTHROMYCIN <=0.25 SENSITIVE Sensitive     GENTAMICIN 1 SENSITIVE Sensitive     OXACILLIN >=4 RESISTANT Resistant     TETRACYCLINE <=1 SENSITIVE Sensitive     VANCOMYCIN <=0.5 SENSITIVE Sensitive     TRIMETH/SULFA <=10 SENSITIVE Sensitive     CLINDAMYCIN <=0.25 SENSITIVE Sensitive     RIFAMPIN <=0.5 SENSITIVE Sensitive     Inducible Clindamycin NEGATIVE Sensitive     * STAPHYLOCOCCUS SPECIES (COAGULASE NEGATIVE)  Blood Culture ID Panel (Reflexed)     Status: None   Collection Time: 06/29/16  8:45 AM  Result Value Ref Range Status   Enterococcus species NOT DETECTED NOT DETECTED Final   Vancomycin resistance NOT DETECTED NOT DETECTED Final   Listeria monocytogenes NOT DETECTED NOT DETECTED Final    Staphylococcus species NOT DETECTED NOT DETECTED Final   Staphylococcus aureus NOT DETECTED NOT DETECTED Final   Methicillin resistance NOT DETECTED NOT DETECTED Final   Streptococcus species NOT DETECTED NOT DETECTED Final   Streptococcus  agalactiae NOT DETECTED NOT DETECTED Final   Streptococcus pneumoniae NOT DETECTED NOT DETECTED Final   Streptococcus pyogenes NOT DETECTED NOT DETECTED Final   Acinetobacter baumannii NOT DETECTED NOT DETECTED Final   Enterobacteriaceae species NOT DETECTED NOT DETECTED Final   Enterobacter cloacae complex NOT DETECTED NOT DETECTED Final   Escherichia coli NOT DETECTED NOT DETECTED Final   Klebsiella oxytoca NOT DETECTED NOT DETECTED Final   Klebsiella pneumoniae NOT DETECTED NOT DETECTED Final   Proteus species NOT DETECTED NOT DETECTED Final   Serratia marcescens NOT DETECTED NOT DETECTED Final   Carbapenem resistance NOT DETECTED NOT DETECTED Final   Haemophilus influenzae NOT DETECTED NOT DETECTED Final   Neisseria meningitidis NOT DETECTED NOT DETECTED Final   Pseudomonas aeruginosa NOT DETECTED NOT DETECTED Final   Candida albicans NOT DETECTED NOT DETECTED Final   Candida glabrata NOT DETECTED NOT DETECTED Final   Candida krusei NOT DETECTED NOT DETECTED Final   Candida parapsilosis NOT DETECTED NOT DETECTED Final   Candida tropicalis NOT DETECTED NOT DETECTED Final    Comment: Performed at [facility]  Culture, blood (Routine X 2)     Status: Abnormal   Collection Time: 06/29/16  8:47 AM  Result Value Ref Range Status   Specimen Description BLOOD LEFT HAND  Final   Special Requests BOTTLES DRAWN AEROBIC AND ANAEROBIC 5CC  Final   Culture  Setup Time   Final    GRAM POSITIVE COCCI IN CLUSTERS IN BOTH AEROBIC AND ANAEROBIC BOTTLES CRITICAL VALUE NOTED.  VALUE IS CONSISTENT WITH PREVIOUSLY REPORTED AND CALLED VALUE.    Culture (A)  Final    STAPHYLOCOCCUS SPECIES (COAGULASE NEGATIVE) SUSCEPTIBILITIES PERFORMED ON PREVIOUS CULTURE  WITHIN THE LAST 5 DAYS. Performed at [facility]    Report Status 07/03/2016 FINAL  Final  Wound or Superficial Culture     Status: None   Collection Time: 06/29/16  8:47 AM  Result Value Ref Range Status   Specimen Description WOUND RIGHT KNEE  Final   Special Requests Normal  Final   Gram Stain   Final    FEW WBC PRESENT, PREDOMINANTLY PMN FEW GRAM POSITIVE COCCI IN PAIRS Performed at [facility]    Culture   Final    MODERATE STAPHYLOCOCCUS SPECIES (COAGULASE NEGATIVE) MODERATE CANDIDA ALBICANS    Report Status 07/03/2016 FINAL  Final   Organism ID, Bacteria STAPHYLOCOCCUS SPECIES (COAGULASE NEGATIVE)  Final      Susceptibility   Staphylococcus species (coagulase negative) - MIC*    CIPROFLOXACIN 2 INTERMEDIATE Intermediate     ERYTHROMYCIN <=0.25 SENSITIVE Sensitive     GENTAMICIN 1 SENSITIVE Sensitive     OXACILLIN >=4 RESISTANT Resistant     TETRACYCLINE <=1 SENSITIVE Sensitive     VANCOMYCIN <=0.5 SENSITIVE Sensitive     TRIMETH/SULFA <=10 SENSITIVE Sensitive     CLINDAMYCIN <=0.25 SENSITIVE Sensitive     RIFAMPIN <=0.5 SENSITIVE Sensitive     Inducible Clindamycin NEGATIVE Sensitive     * MODERATE STAPHYLOCOCCUS SPECIES (COAGULASE NEGATIVE)  Urine C&S     Status: Abnormal   Collection Time: 06/29/16  9:07 AM  Result Value Ref Range Status   Specimen Description URINE, CLEAN CATCH  Final   Special Requests Normal  Final   Culture 40,000 COLONIES/mL ENTEROBACTER AEROGENES (A)  Final   Report Status 07/01/2016 FINAL  Final   Organism ID, Bacteria ENTEROBACTER AEROGENES (A)  Final      Susceptibility   Enterobacter aerogenes - MIC*    CEFAZOLIN >=  64 RESISTANT Resistant     CEFTRIAXONE >=64 RESISTANT Resistant     CIPROFLOXACIN <=0.25 SENSITIVE Sensitive     GENTAMICIN <=1 SENSITIVE Sensitive     IMIPENEM <=0.25 SENSITIVE Sensitive     NITROFURANTOIN 64 INTERMEDIATE Intermediate     TRIMETH/SULFA <=20 SENSITIVE Sensitive     PIP/TAZO >=128  RESISTANT Resistant     * 40,000 COLONIES/mL ENTEROBACTER AEROGENES  MRSA PCR Screening     Status: None   Collection Time: 06/29/16  1:27 PM  Result Value Ref Range Status   MRSA by PCR NEGATIVE NEGATIVE Final    Comment:        The GeneXpert MRSA Assay (FDA approved for NASAL specimens only), is one component of a comprehensive MRSA colonization surveillance program. It is not intended to diagnose MRSA infection nor to guide or monitor treatment for MRSA infections.   Culture, blood (Routine X 2) w Reflex to ID Panel     Status: None (Preliminary result)   Collection Time: 06/30/16 12:37 PM  Result Value Ref Range Status   Specimen Description BLOOD LEFT ARM  Final   Special Requests BOTTLES DRAWN AEROBIC ONLY 5CC  Final   Culture   Final    NO GROWTH 3 DAYS Performed at [facility]    Report Status PENDING  Incomplete  Culture, blood (Routine X 2) w Reflex to ID Panel     Status: Abnormal   Collection Time: 06/30/16 12:41 PM  Result Value Ref Range Status   Specimen Description BLOOD LEFT HAND  Final   Special Requests IN PEDIATRIC BOTTLE 2CC  Final   Culture  Setup Time   Final    GRAM POSITIVE COCCI IN CLUSTERS AEROBIC BOTTLE ONLY CRITICAL RESULT CALLED TO, READ BACK BY AND VERIFIED WITH: T. PICKERING RPH AT 1209 07/01/16 BY D. VANHOOK    Culture (A)  Final    STAPHYLOCOCCUS SPECIES (COAGULASE NEGATIVE) SUSCEPTIBILITIES PERFORMED ON PREVIOUS CULTURE WITHIN THE LAST 5 DAYS. Performed at [facility]    Report Status 07/03/2016 FINAL  Final  Culture, blood (Routine X 2) w Reflex to ID Panel     Status: None (Preliminary result)   Collection Time: 07/02/16  7:56 AM  Result Value Ref Range Status   Specimen Description BLOOD LEFT ANTECUBITAL  Final   Special Requests BOTTLES DRAWN AEROBIC AND ANAEROBIC 10CC  Final   Culture   Final    NO GROWTH 1 DAY Performed at [facility]    Report Status PENDING  Incomplete         Radiology  Studies: No results found.    Scheduled Meds: . aspirin  81 mg Oral Daily  . calcium-vitamin D  1 tablet Oral BID  . carbidopa-levodopa  2 tablet Oral 4 times per day  . Chlorhexidine Gluconate Cloth  6 each Topical Once  . diclofenac  75 mg Oral BID AC  . enoxaparin (LOVENOX) injection  40 mg Subcutaneous Q24H  . feeding supplement (ENSURE ENLIVE)  237 mL Oral TID BM  . finasteride  5 mg Oral Daily  . fluconazole (DIFLUCAN) IV  200 mg Intravenous Q24H  . insulin aspart  0-20 Units Subcutaneous TID WC  . insulin aspart  0-5 Units Subcutaneous QHS  . insulin glargine  26 Units Subcutaneous Daily  . INTEGRA PLUS  1 capsule Oral q morning - 10a  . linagliptin  5 mg Oral Daily  . mirabegron ER  50 mg Oral Daily  . multivitamin with  minerals  1 tablet Oral Daily  . pantoprazole  40 mg Oral BID  . pioglitazone  45 mg Oral Daily  . rosuvastatin  10 mg Oral Daily  . saccharomyces boulardii  250 mg Oral BID  . tamsulosin  0.4 mg Oral QPC breakfast  . vitamin A  10,000 Units Oral Daily  . zinc sulfate  220 mg Oral Daily   Continuous Infusions:     LOS: 5 days    Time spent in minutes: [place], MD [facility] Pager: [email or web address] Password [email or web address] 07/04/2016, 2:58 PM

## 2016-07-05 ENCOUNTER — Inpatient Hospital Stay (HOSPITAL_COMMUNITY): Payer: Medicare Other | Admitting: Certified Registered Nurse Anesthetist

## 2016-07-05 ENCOUNTER — Encounter (HOSPITAL_COMMUNITY)
Admission: EM | Disposition: A | Discharge status: Skilled Nursing Facility | Payer: Self-pay | Source: Home / Self Care | Attending: Internal Medicine

## 2016-07-05 ENCOUNTER — Encounter (HOSPITAL_COMMUNITY): Payer: Self-pay | Admitting: Plastic Surgery

## 2016-07-05 DIAGNOSIS — B9562 Methicillin resistant Staphylococcus aureus infection as the cause of diseases classified elsewhere: Secondary | ICD-10-CM

## 2016-07-05 HISTORY — PX: APPLICATION OF A-CELL OF EXTREMITY: SHX6303

## 2016-07-05 HISTORY — PX: INCISION AND DRAINAGE OF WOUND: SHX1803

## 2016-07-05 LAB — GLUCOSE, CAPILLARY
GLUCOSE-CAPILLARY: 208 mg/dL — AB (ref 65–99)
GLUCOSE-CAPILLARY: 241 mg/dL — AB (ref 65–99)
Glucose-Capillary: 199 mg/dL — ABNORMAL HIGH (ref 65–99)
Glucose-Capillary: 306 mg/dL — ABNORMAL HIGH (ref 65–99)

## 2016-07-05 LAB — CULTURE, BLOOD (ROUTINE X 2): Culture: NO GROWTH

## 2016-07-05 LAB — VANCOMYCIN, TROUGH: Vancomycin Tr: 15 ug/mL (ref 15–20)

## 2016-07-05 SURGERY — IRRIGATION AND DEBRIDEMENT WOUND
Anesthesia: General | Laterality: Right

## 2016-07-05 MED ORDER — EPHEDRINE SULFATE 50 MG/ML IJ SOLN
INTRAMUSCULAR | Status: DC | PRN
  Administered 2016-07-05: 5 mg via INTRAVENOUS

## 2016-07-05 MED ORDER — 0.9 % SODIUM CHLORIDE (POUR BTL) OPTIME
TOPICAL | Status: DC | PRN
  Administered 2016-07-05: 1000 mL

## 2016-07-05 MED ORDER — LIDOCAINE HCL (CARDIAC) 20 MG/ML IV SOLN
INTRAVENOUS | Status: DC | PRN
  Administered 2016-07-05: 40 mg via INTRAVENOUS

## 2016-07-05 MED ORDER — FENTANYL CITRATE (PF) 100 MCG/2ML IJ SOLN
INTRAMUSCULAR | Status: AC
  Filled 2016-07-05: qty 2

## 2016-07-05 MED ORDER — SODIUM CHLORIDE 0.9 % IR SOLN
Status: DC | PRN
  Administered 2016-07-05: 500 mL

## 2016-07-05 MED ORDER — SODIUM CHLORIDE 0.9 % IV SOLN
INTRAVENOUS | Status: DC
  Administered 2016-07-06 (×2): via INTRAVENOUS

## 2016-07-05 MED ORDER — ONDANSETRON HCL 4 MG/2ML IJ SOLN: 4.0000 mg | Freq: Once | INTRAMUSCULAR | Status: DC | PRN

## 2016-07-05 MED ORDER — HYDROMORPHONE HCL 1 MG/ML IJ SOLN
INTRAMUSCULAR | Status: AC
  Filled 2016-07-05: qty 1

## 2016-07-05 MED ORDER — LACTATED RINGERS IV SOLN: INTRAVENOUS | Status: DC

## 2016-07-05 MED ORDER — ONDANSETRON HCL 4 MG/2ML IJ SOLN
INTRAMUSCULAR | Status: DC | PRN
  Administered 2016-07-05: 4 mg via INTRAVENOUS

## 2016-07-05 MED ORDER — FENTANYL CITRATE (PF) 100 MCG/2ML IJ SOLN
INTRAMUSCULAR | Status: DC | PRN
  Administered 2016-07-05: 50 ug via INTRAVENOUS

## 2016-07-05 MED ORDER — LIDOCAINE HCL (CARDIAC) 20 MG/ML IV SOLN
INTRAVENOUS | Status: AC
  Filled 2016-07-05: qty 5

## 2016-07-05 MED ORDER — MEPERIDINE HCL 50 MG/ML IJ SOLN: 6.2500 mg | INTRAMUSCULAR | Status: DC | PRN

## 2016-07-05 MED ORDER — HYDROMORPHONE HCL 1 MG/ML IJ SOLN
0.2500 mg | INTRAMUSCULAR | Status: DC | PRN
  Administered 2016-07-05 (×2): 0.25 mg via INTRAVENOUS
  Administered 2016-07-05: 0.5 mg via INTRAVENOUS

## 2016-07-05 MED ORDER — SODIUM CHLORIDE 0.9 % IJ SOLN
INTRAMUSCULAR | Status: AC
  Filled 2016-07-05: qty 10

## 2016-07-05 MED ORDER — ONDANSETRON HCL 4 MG/2ML IJ SOLN
INTRAMUSCULAR | Status: AC
  Filled 2016-07-05: qty 2

## 2016-07-05 MED ORDER — STERILE WATER FOR IRRIGATION IR SOLN
Status: DC | PRN
  Administered 2016-07-05: 1000 mL

## 2016-07-05 MED ORDER — LACTATED RINGERS IV SOLN
INTRAVENOUS | Status: DC | PRN
  Administered 2016-07-05: 07:00:00 via INTRAVENOUS

## 2016-07-05 MED ORDER — FENTANYL CITRATE (PF) 100 MCG/2ML IJ SOLN
25.0000 ug | INTRAMUSCULAR | Status: DC | PRN
  Administered 2016-07-05 (×2): 25 ug via INTRAVENOUS
  Administered 2016-07-05: 50 ug via INTRAVENOUS

## 2016-07-05 MED ORDER — DEXAMETHASONE SODIUM PHOSPHATE 10 MG/ML IJ SOLN
INTRAMUSCULAR | Status: AC
  Filled 2016-07-05: qty 1

## 2016-07-05 MED ORDER — SODIUM CHLORIDE 0.9 % IR SOLN
Status: AC
  Filled 2016-07-05: qty 1

## 2016-07-05 MED ORDER — EPHEDRINE SULFATE 50 MG/ML IJ SOLN
INTRAMUSCULAR | Status: AC
  Filled 2016-07-05: qty 1

## 2016-07-05 MED ORDER — FENTANYL CITRATE (PF) 100 MCG/2ML IJ SOLN
INTRAMUSCULAR | Status: AC
  Filled 2016-07-05: qty 4

## 2016-07-05 MED ORDER — PROPOFOL 10 MG/ML IV BOLUS
INTRAVENOUS | Status: DC | PRN
  Administered 2016-07-05: 80 mg via INTRAVENOUS

## 2016-07-05 MED ORDER — PROPOFOL 10 MG/ML IV BOLUS
INTRAVENOUS | Status: AC
  Filled 2016-07-05: qty 20

## 2016-07-05 MED ORDER — INSULIN GLARGINE 100 UNIT/ML ~~LOC~~ SOLN
25.0000 [IU] | Freq: Every day | SUBCUTANEOUS | Status: DC
  Administered 2016-07-06 – 2016-07-09 (×4): 25 [IU] via SUBCUTANEOUS
  Filled 2016-07-05 (×4): qty 0.25

## 2016-07-05 MED ORDER — INSULIN GLARGINE 100 UNIT/ML ~~LOC~~ SOLN: 30.0000 [IU] | Freq: Every day | SUBCUTANEOUS | Status: DC

## 2016-07-05 MED ORDER — VANCOMYCIN HCL IN DEXTROSE 1-5 GM/200ML-% IV SOLN
1000.0000 mg | INTRAVENOUS | Status: DC
  Administered 2016-07-05: 1000 mg via INTRAVENOUS
  Filled 2016-07-05: qty 200

## 2016-07-05 SURGICAL SUPPLY — 40 items
BANDAGE ACE 4X5 VEL STRL LF (GAUZE/BANDAGES/DRESSINGS) ×1 IMPLANT
BANDAGE ACE 6X5 VEL STRL LF (GAUZE/BANDAGES/DRESSINGS) ×2 IMPLANT
BLADE HEX COATED 2.75 (ELECTRODE) ×3 IMPLANT
BNDG COHESIVE 6X5 TAN STRL LF (GAUZE/BANDAGES/DRESSINGS) IMPLANT
BNDG GAUZE ELAST 4 BULKY (GAUZE/BANDAGES/DRESSINGS) ×4 IMPLANT
CONT SPECI 4OZ STER CLIK (MISCELLANEOUS) ×3 IMPLANT
COVER SURGICAL LIGHT HANDLE (MISCELLANEOUS) ×3 IMPLANT
DECANTER SPIKE VIAL GLASS SM (MISCELLANEOUS) IMPLANT
DRAPE EXTREMITY T 121X128X90 (DRAPE) ×2 IMPLANT
DRAPE INCISE IOBAN 66X45 STRL (DRAPES) ×3 IMPLANT
DRAPE LAPAROTOMY T 102X78X121 (DRAPES) ×1 IMPLANT
DRAPE UTILITY XL STRL (DRAPES) ×1 IMPLANT
DRESSING DUODERM 4X4 STERILE (GAUZE/BANDAGES/DRESSINGS) ×2 IMPLANT
DRSG ADAPTIC 3X8 NADH LF (GAUZE/BANDAGES/DRESSINGS) ×1 IMPLANT
DRSG EMULSION OIL 3X16 NADH (GAUZE/BANDAGES/DRESSINGS) ×2 IMPLANT
DRSG PAD ABDOMINAL 8X10 ST (GAUZE/BANDAGES/DRESSINGS) ×3 IMPLANT
ELECT REM PT RETURN 9FT ADLT (ELECTROSURGICAL) ×3
ELECTRODE REM PT RTRN 9FT ADLT (ELECTROSURGICAL) ×1 IMPLANT
GAUZE SPONGE 4X4 12PLY STRL (GAUZE/BANDAGES/DRESSINGS) ×1 IMPLANT
GOWN STRL REUS W/TWL LRG LVL3 (GOWN DISPOSABLE) ×8 IMPLANT
HANDPIECE INTERPULSE COAX TIP (DISPOSABLE)
IV SOD CHL 0.9% 1000ML (IV SOLUTION) IMPLANT
KIT BASIN OR (CUSTOM PROCEDURE TRAY) ×3 IMPLANT
MATRIX SURGICAL PSMX 7X10CM (Tissue) ×2 IMPLANT
MICROMATRIX 1000MG (Tissue) ×3 IMPLANT
NEEDLE HYPO 22GX1.5 SAFETY (NEEDLE) IMPLANT
NS IRRIG 1000ML POUR BTL (IV SOLUTION) ×1 IMPLANT
PACK GENERAL/GYN (CUSTOM PROCEDURE TRAY) ×3 IMPLANT
PAD CAST 4YDX4 CTTN HI CHSV (CAST SUPPLIES) ×1 IMPLANT
PADDING CAST COTTON 4X4 STRL (CAST SUPPLIES)
SET HNDPC FAN SPRY TIP SCT (DISPOSABLE) IMPLANT
SOLUTION PARTIC MCRMTRX 1000MG (Tissue) IMPLANT
STAPLER VISISTAT 35W (STAPLE) ×1 IMPLANT
STOCKINETTE 8 INCH (MISCELLANEOUS) ×2 IMPLANT
SUT SILK 4 0 PS 2 (SUTURE) ×2 IMPLANT
SUT VIC AB 5-0 PS2 18 (SUTURE) ×4 IMPLANT
SWAB CULTURE ESWAB REG 1ML (MISCELLANEOUS) ×2 IMPLANT
SYR BULB IRRIGATION 50ML (SYRINGE) ×2 IMPLANT
SYR CONTROL 10ML LL (SYRINGE) IMPLANT
TOWEL OR 17X26 10 PK STRL BLUE (TOWEL DISPOSABLE) ×3 IMPLANT

## 2016-07-05 NOTE — Progress Notes (Signed)
Received request from IM for TEE. Scheduled for Tues 7/18 at 3pm. One of our APPs will be by to discuss r/b with patient. Dayna Dunn PA-C

## 2016-07-05 NOTE — Anesthesia Procedure Notes (Signed)
Procedure Name: MAC Date/Time: 07/05/2016 7:27 AM Performed by: West Pugh Pre-anesthesia Checklist: Patient identified, Emergency Drugs available, Suction available, Patient being monitored and Timeout performed Patient Re-evaluated:Patient Re-evaluated prior to inductionOxygen Delivery Method: Circle system utilized Preoxygenation: Pre-oxygenation with 100% oxygen Intubation Type: IV induction Ventilation: Mask ventilation without difficulty LMA: LMA inserted LMA Size: 4.0 Number of attempts: 1 Tube secured with: Tape Dental Injury: Teeth and Oropharynx as per pre-operative assessment

## 2016-07-05 NOTE — Progress Notes (Signed)
Pre-op CHG bath given at midnight- no place to chart on MAR. Will repeat CHG bath at 0600 per protocol. Hortencia Conradi RN

## 2016-07-05 NOTE — Anesthesia Postprocedure Evaluation (Signed)
Anesthesia Post Note  Patient: [name] MD  Procedure(s) Performed: Procedure(s) (LRB): IRRIGATION AND DEBRIDEMENT WOUND RIGHT KNEE  (Right) APPLICATION OF A-CELL OF EXTREMITY (Right)  Patient location during evaluation: PACU Anesthesia Type: General Level of consciousness: awake and alert Pain management: pain level controlled Vital Signs Assessment: post-procedure vital signs reviewed and stable Respiratory status: spontaneous breathing, nonlabored ventilation, respiratory function stable and patient connected to nasal cannula oxygen Cardiovascular status: blood pressure returned to baseline and stable Postop Assessment: no signs of nausea or vomiting Anesthetic complications: no    Last Vitals:  Filed Vitals:   07/05/16 0945 07/05/16 0954  BP:  143/66  Pulse: 51 59  Temp:  36.4 C  Resp: 18 27    Last Pain:  Filed Vitals:   07/05/16 1227  PainSc: 6                  Jahnavi Muratore DAVID

## 2016-07-05 NOTE — Progress Notes (Signed)
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  Will cont to follow and see as schedule permits.     [name]  PTA [facility]  Acute  Rehab Pager      [phone]

## 2016-07-05 NOTE — Op Note (Signed)
Operative Note   DATE OF OPERATION: 07/05/2016  LOCATION: [facility]  SURGICAL DIVISION: Plastic Surgery  PREOPERATIVE DIAGNOSES:  Right knee wound s 3  POSTOPERATIVE DIAGNOSES:  same  PROCEDURE:   1. Excisional debridement of distal right leg wound including skin and bone (3 x 5 cm). 2. Preparation of right knee wounds [proximal 1.5 x 3 cm, lateral (1 x 1.5 cm)] for placement of Acell (7 x 10 cm sheet and 1 gm powder) and VAC. 3. Culture of right proximal and distal wound.  SURGEON: Susana Gripp Sanger Marijane Trower, DO  ASSISTANT: Shawn Rayburn, PA  ANESTHESIA:  General.   COMPLICATIONS: None.   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  He underwent a knee rep0lacement and then had two episodes of knee dislocation.  A wound developed as a result of the brace and the fall. MRN: [id number]  CONSENT:  Informed consent was obtained directly from the patient. Risks, benefits and alternatives were fully discussed. Specific risks including but not limited to bleeding, infection, hematoma, seroma, scarring, pain, infection, contracture, asymmetry, wound healing problems, and need for further surgery were all discussed. The patient did have an ample opportunity to have questions answered to satisfaction.   DESCRIPTION OF PROCEDURE:  The patient was taken to the operating room. SCD was placed on the left leg.  Antibiotics were given. The patient's operative site was prepped and draped in a sterile fashion. A time out was performed and all information was confirmed to be correct.  General anesthesia was administered.  The wounds were irrigated with normal saline.  The #10 blade was used to debride the skin and soft tissue of the distal wound 3 x 5 cm.  There was bone exposed at the base of the wound which was dark brown in color.  The ronguer and osteotome were used to remove  the outer piece of bone and send for cultures (gram stain, C/S).  The proximal wound 1.5 x 3 cm had the prosthetic exposed. Swabs were obtain within this space for micro.   The #10 blade was used to debride the edges which had some blood flow.  The lateral wound 1 x 1.5 cm was debrided with the #10 blade.  All the wounds were then irrigated with antibiotic solution. Hemostasis was achieved where necessary using electrocautery. The Acell was utilized between the wounds (6x 8 cm and 1 gm of powder).  The 5-0 Vicryl was used to secure the sheet in place.  Adaptic was applied over the area and secured with the 4-0 Silk.   The VAC was applied and an excellent seal obtained.  The patient tolerated the procedure well.  There were no complications. The patient was allowed to wake from anesthesia, extubated and taken to the recovery room in satisfactory condition.

## 2016-07-05 NOTE — Progress Notes (Signed)
Arrived to discuss placement of PICC with patient. Patient requests placement in AM. Currently with 2 patent PIV's. Procedure at 3 PM. Placement deferred to AM per patient request. Primary RN aware.

## 2016-07-05 NOTE — Progress Notes (Signed)
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  Pharmacy has been consulted for Vancomycin dosing for bacteremia and r/o prosthetic valve endocarditis.  Plan:  Vancomycin 1g IV q48h.  Re-check Vanc trough at steady state.  Follow up renal fxn, culture results, and clinical course.  Follow up TEE results (Add RIF, GENT if +PVE)  Height: 5\' 7"  (170.2 cm) Weight: 173 lb 15.1 oz (78.9 kg) IBW/kg (Calculated) : 66.1  Temp (24hrs), Avg:98.3 F (36.8 C), Min:98 F (36.7 C), Max:98.5 F (36.9 C)   Recent Labs Lab 06/29/16 0847 06/29/16 0853 06/30/16 0357 07/01/16 0445 07/02/16 0440 07/03/16 0423  07/04/16 0435 07/04/16 0839 07/05/16 0509  WBC 8.2  --  10.2  --  9.3 10.0  --   --  9.4  --   CREATININE 0.80  --  0.74 0.72 0.78 0.86  --   --  0.82  --   LATICACIDVEN  --  1.00  --   --   --   --   --   --   --   --   VANCOTROUGH  --   --   --   --   --   --   < > 25*  --  15  < > = values in this interval not displayed.  Estimated Creatinine Clearance: 58.2 mL/min (by C-G formula based on Cr of 0.82).    Allergies  Allergen Reactions  . Ace Inhibitors Other (See Comments)    cough  . Succinylcholine Other (See Comments)    Prolonged sedation  . Morphine And Related Nausea And Vomiting    Antimicrobials this admission: 7/11 Vancomycin >>  7/11 Zosyn >> 7/12 7/15 Fluconazole >>  Dose adjustments this admission: 7/15 0900 Vanc trough = 32 mcg/ml on Vancomycin 1g IV q12h. Note that trough drawn at 0901, results still pending at 11:45 - call placed to lab, sample not yet in process, but will be completed shortly. Trough elevated, unfortunately the 10am Vancomycin 1gm dose given 7/16: Rand Vanc level = 25 mcg/ml 7/17 Rand  Vanc level = 15 (last dose ~ 42 hours prior)    Microbiology results: 7/11 BCx: 2/2 gram stain GPC/clusters, but BCID neg  resulted CoNS - Oxacillin Res 7/11 UCx: 40K enterobacter aerogenes (S to cipro, gent, imi, bactrim) 7/11 Wound cx R knee: MR-CoNS and C. albicans 7/11 MRSA: neg (has been pos in past) 7/12 BCx: GPC clusters 1/2 (BCID wont be run on this since same morphology as previous blood cx) 7/14 rpt BCx: NGTD   Thank you for allowing pharmacy to be a part of this patient's care.  Gretta Arab PharmD, BCPS Pager [phone] 07/05/2016 6:56 AM

## 2016-07-05 NOTE — Interval H&P Note (Signed)
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 After consideration of risks, benefits and other options for treatment, the patient has consented to  Procedure(s): IRRIGATION AND DEBRIDEMENT WOUND OR RIGHT KNEE WOUND (Right) APPLICATION OF A-CELL OF EXTREMITY (Right) as a surgical intervention .  The patient's history has been reviewed, patient examined, no change in status, stable for surgery.  I have reviewed the patient's chart and labs.  Questions were answered to the patient's satisfaction.     Stanley [name]

## 2016-07-05 NOTE — Progress Notes (Signed)
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 He has been on keflex for the wound. He presents with confusion, fever, increased drainage and incontinence. He has been having frequent falls.   Subjective: Feeling good. No complaints today.   Assessment & Plan:   Principal Problem:   Sepsis - fever 101, confusion, knee wound and blood cultures growing coag neg staph- repeat cultures on 7/12 also postiive 1/ 2 sets - has bioprosthetic AVR- ECHO > no vegetations noted- recommended TEE- I have contacted cardiology- plan TEE for tomorrow - ID managing antibiotics - I and D done today per plastic surgery  - cultures sent -- third set of blood cultures negative-  PICC ordered today  Active Problems:  Right knee dislocation- prosthetic knee - s/p closed reduction x 2  - superficial wound infection- ortho and plastic surgery eval- see above   Diabetes mellitus without complication  - sugars elevated despite poor PO intake- recently received epidural steroid about 1 month ago - resumed Actos and Januvia - increased SSI- added Lantus - will increase Lantus again today - he is drinking Ensure TID between meals which he prefers over Glucerna and this is exacerbating his sugars further -will be giving 16 U Novolog now at dinner - A1c 7.6 in 3/17- no need to recheck at this admission-  - diabetic diet  Obstructive sleep apnea CPAP    CAD (coronary artery disease) of artery bypass graft - Aspirin  Anemia -  h/o Iron deficiency anemia followed by Dr Julien Nordmann - cont oral iron- given IV Iron on 7/16    S/P AVR    Chronic systolic CHF (congestive heart failure) - stopped IVF  - following I and O   Parkinson's disease - Sinemet  BPH/ bladder issues? - on Proscar, Flomax and Myrbetriq   DVT prophylaxis: Lovenox Code Status: full code Family Communication:  Disposition Plan:  Consultants:   ID, ortho, plastic surgery Procedures:  2 D ECHO 07/01/16 Study Conclusions  - Left ventricle: The cavity size was normal. Wall thickness was  increased in a pattern of moderate LVH. Systolic function was  normal. The estimated ejection fraction was in the range of 50%  to 55%. Although no diagnostic regional wall motion abnormality  was identified, this possibility cannot be completely excluded on  the basis of this study. - Aortic valve: A bioprosthesis was present and functioning  normally. - Aortic root: The aortic root was mildly dilated. - Mitral valve: There was mild regurgitation. - Left atrium: The atrium was moderately dilated. - Right ventricle: The cavity size was dilated. Wall thickness was  normal. Systolic function was mildly reduced. - Right atrium: The atrium was moderately dilated. - Tricuspid valve: There was mild-moderate regurgitation. - Pulmonary arteries: Systolic pressure was moderately increased.  PA peak pressure: 53 mm Hg (S). Antimicrobials:  Anti-infectives    Start     Dose/Rate Route Frequency Ordered Stop   07/05/16 0813  polymyxin B 500,000 Units, bacitracin 50,000 Units in sodium chloride irrigation 0.9 % 500 mL irrigation  Status:  Discontinued       As needed 07/05/16 0822 07/05/16 0824   07/05/16 0800  vancomycin (VANCOCIN) IVPB 1000 mg/200 mL premix  1,000 mg 200 mL/hr over 60 Minutes Intravenous Every 48 hours 07/05/16 0733     07/04/16 1600  fluconazole (DIFLUCAN) IVPB 400 mg  Status:  Discontinued     400 mg 100 mL/hr over 120 Minutes Intravenous Every 24 hours 07/03/16 1606 07/04/16 1157   07/04/16 1600  fluconazole (DIFLUCAN) IVPB 200 mg     200 mg 100 mL/hr over 60 Minutes Intravenous Every 24 hours 07/04/16 1157      07/03/16 1700  fluconazole (DIFLUCAN) IVPB 800 mg     800 mg 100 mL/hr over 240 Minutes Intravenous  Once 07/03/16 1606 07/03/16 2120   06/29/16 2200  vancomycin (VANCOCIN) IVPB 1000 mg/200 mL premix  Status:  Discontinued     1,000 mg 200 mL/hr over 60 Minutes Intravenous Every 12 hours 06/29/16 0939 07/03/16 1218   06/29/16 1600  piperacillin-tazobactam (ZOSYN) IVPB 3.375 g  Status:  Discontinued     3.375 g 12.5 mL/hr over 240 Minutes Intravenous Every 8 hours 06/29/16 0940 07/01/16 1335   06/29/16 0915  piperacillin-tazobactam (ZOSYN) IVPB 3.375 g     3.375 g 100 mL/hr over 30 Minutes Intravenous  Once 06/29/16 0911 06/29/16 0950   06/29/16 0915  vancomycin (VANCOCIN) IVPB 1000 mg/200 mL premix     1,000 mg 200 mL/hr over 60 Minutes Intravenous  Once 06/29/16 0912 06/29/16 1124       Objective: Filed Vitals:   07/05/16 0944 07/05/16 0945 07/05/16 0954 07/05/16 1501  BP: 141/57  143/66 110/60  Pulse:  51 59 55  Temp: 96.7 F (35.9 C)  97.5 F (36.4 C) 97.5 F (36.4 C)  TempSrc:    Oral  Resp:  18 27 18   Height:      Weight:      SpO2:  100% 95% 95%    Intake/Output Summary (Last 24 hours) at 07/05/16 1506 Last data filed at 07/05/16 1431  Gross per 24 hour  Intake   1120 ml  Output   1300 ml  Net   -180 ml   Filed Weights   06/29/16 0825 06/29/16 1227  Weight: 81.647 kg (180 lb) 78.9 kg (173 lb 15.1 oz)    Examination: General exam: Appears comfortable  HEENT: PERRLA, oral mucosa moist, no sclera icterus or thrush Respiratory system: Clear to auscultation. Respiratory effort normal. Cardiovascular system: S1 & S2 heard, RRR.  No murmurs  Gastrointestinal system: Abdomen soft, non-tender, nondistended. Normal bowel sound. No organomegaly Central nervous system: Alert and oriented. No focal neurological deficits. Extremities: No cyanosis, clubbing or edema Skin: knee in dression Psychiatry:  Mood & affect appropriate.     Data Reviewed: I have personally  reviewed following labs and imaging studies  CBC:  Recent Labs Lab 06/29/16 0847 06/30/16 0357 07/02/16 0440 07/03/16 0423 07/04/16 0839  WBC 8.2 10.2 9.3 10.0 9.4  NEUTROABS 6.7  --   --   --   --   HGB 10.5* 9.8* 8.9* 9.7* 9.1*  HCT 31.4* 29.2* 26.5* 28.9* 27.2*  MCV 95.2 95.4 94.6 94.4 95.8  PLT 164 177 178 263 [id number]   Basic Metabolic Panel:  Recent Labs Lab 06/30/16 0357 07/01/16 0445 07/02/16 0440 07/03/16 0423 07/04/16 0839  NA 133* 134* 133* 135 133*  K 4.1 4.4 4.5 4.4 4.5  CL 103 105 103 101 99*  CO2 21* 23 24 27 27   GLUCOSE 243* 245* 276* 240* 226*  BUN 24* 21* 31* 32* 28*  CREATININE 0.74 0.72 0.78 0.86 0.82  CALCIUM 8.3* 8.2* 8.3*  8.8* 8.4*   GFR: Estimated Creatinine Clearance: 58.2 mL/min (by C-G formula based on Cr of 0.82). Liver Function Tests:  Recent Labs Lab 06/29/16 0847  AST 37  ALT 6*  ALKPHOS 78  BILITOT 0.5  PROT 6.7  ALBUMIN 3.2*   No results for input(s): LIPASE, AMYLASE in the last 168 hours. No results for input(s): AMMONIA in the last 168 hours. Coagulation Profile: No results for input(s): INR, PROTIME in the last 168 hours. Cardiac Enzymes: No results for input(s): CKTOTAL, CKMB, CKMBINDEX, TROPONINI in the last 168 hours. BNP (last 3 results) No results for input(s): PROBNP in the last 8760 hours. HbA1C: No results for input(s): HGBA1C in the last 72 hours. CBG:  Recent Labs Lab 07/04/16 1157 07/04/16 1717 07/04/16 2257 07/05/16 0846 07/05/16 1151  GLUCAP 366* 234* 212* 208* 199*   Lipid Profile: No results for input(s): CHOL, HDL, LDLCALC, TRIG, CHOLHDL, LDLDIRECT in the last 72 hours. Thyroid Function Tests: No results for input(s): TSH, T4TOTAL, FREET4, T3FREE, THYROIDAB in the last 72 hours. Anemia Panel:  Recent Labs  07/03/16 0423  VITAMINB12 1002*  FOLATE 45.3  FERRITIN 1910*  TIBC 176*  IRON 17*  RETICCTPCT 1.4   Urine analysis:    Component Value Date/Time   COLORURINE YELLOW 06/29/2016  0907   APPEARANCEUR CLOUDY* 06/29/2016 0907   LABSPEC 1.026 06/29/2016 0907   PHURINE 5.5 06/29/2016 0907   GLUCOSEU 100* 06/29/2016 0907   HGBUR NEGATIVE 06/29/2016 0907   HGBUR negative 02/25/2010 0743   BILIRUBINUR NEGATIVE 06/29/2016 0907   BILIRUBINUR n 02/25/2016 1237   KETONESUR NEGATIVE 06/29/2016 0907   PROTEINUR 100* 06/29/2016 0907   PROTEINUR 1+ 02/25/2016 1237   UROBILINOGEN 0.2 02/25/2016 1237   UROBILINOGEN 0.2 08/20/2010 1651   NITRITE NEGATIVE 06/29/2016 0907   NITRITE n 02/25/2016 1237   LEUKOCYTESUR NEGATIVE 06/29/2016 0907   Sepsis Labs: @LABRCNTIP (procalcitonin:4,lacticidven:4) ) Recent Results (from the past 240 hour(s))  Culture, blood (Routine X 2)     Status: Abnormal   Collection Time: 06/29/16  8:45 AM  Result Value Ref Range Status   Specimen Description BLOOD RIGHT ANTECUBITAL  Final   Special Requests BOTTLES DRAWN AEROBIC AND ANAEROBIC 5ML  Final   Culture  Setup Time   Final    GRAM POSITIVE COCCI IN CLUSTERS IN BOTH AEROBIC AND ANAEROBIC BOTTLES CRITICAL RESULT CALLED TO, READ BACK BY AND VERIFIED WITH: MLT T.MULLINS [id number] @0710  MLM CRITICAL RESULT CALLED TO, READ BACK BY AND VERIFIED WITH: SEAY,K. RN AT [id number] 06/30/16 MULLINS,T Performed at [facility]    Culture STAPHYLOCOCCUS SPECIES (COAGULASE NEGATIVE) (A)  Final   Report Status 07/02/2016 FINAL  Final   Organism ID, Bacteria STAPHYLOCOCCUS SPECIES (COAGULASE NEGATIVE)  Final      Susceptibility   Staphylococcus species (coagulase negative) - MIC*    CIPROFLOXACIN 2 INTERMEDIATE Intermediate     ERYTHROMYCIN <=0.25 SENSITIVE Sensitive     GENTAMICIN 1 SENSITIVE Sensitive     OXACILLIN >=4 RESISTANT Resistant     TETRACYCLINE <=1 SENSITIVE Sensitive     VANCOMYCIN <=0.5 SENSITIVE Sensitive     TRIMETH/SULFA <=10 SENSITIVE Sensitive     CLINDAMYCIN <=0.25 SENSITIVE Sensitive     RIFAMPIN <=0.5 SENSITIVE Sensitive     Inducible Clindamycin NEGATIVE Sensitive     *  STAPHYLOCOCCUS SPECIES (COAGULASE NEGATIVE)  Blood Culture ID Panel (Reflexed)     Status: None   Collection Time: 06/29/16  8:45 AM  Result Value Ref Range Status   Enterococcus species  NOT DETECTED NOT DETECTED Final   Vancomycin resistance NOT DETECTED NOT DETECTED Final   Listeria monocytogenes NOT DETECTED NOT DETECTED Final   Staphylococcus species NOT DETECTED NOT DETECTED Final   Staphylococcus aureus NOT DETECTED NOT DETECTED Final   Methicillin resistance NOT DETECTED NOT DETECTED Final   Streptococcus species NOT DETECTED NOT DETECTED Final   Streptococcus agalactiae NOT DETECTED NOT DETECTED Final   Streptococcus pneumoniae NOT DETECTED NOT DETECTED Final   Streptococcus pyogenes NOT DETECTED NOT DETECTED Final   Acinetobacter baumannii NOT DETECTED NOT DETECTED Final   Enterobacteriaceae species NOT DETECTED NOT DETECTED Final   Enterobacter cloacae complex NOT DETECTED NOT DETECTED Final   Escherichia coli NOT DETECTED NOT DETECTED Final   Klebsiella oxytoca NOT DETECTED NOT DETECTED Final   Klebsiella pneumoniae NOT DETECTED NOT DETECTED Final   Proteus species NOT DETECTED NOT DETECTED Final   Serratia marcescens NOT DETECTED NOT DETECTED Final   Carbapenem resistance NOT DETECTED NOT DETECTED Final   Haemophilus influenzae NOT DETECTED NOT DETECTED Final   Neisseria meningitidis NOT DETECTED NOT DETECTED Final   Pseudomonas aeruginosa NOT DETECTED NOT DETECTED Final   Candida albicans NOT DETECTED NOT DETECTED Final   Candida glabrata NOT DETECTED NOT DETECTED Final   Candida krusei NOT DETECTED NOT DETECTED Final   Candida parapsilosis NOT DETECTED NOT DETECTED Final   Candida tropicalis NOT DETECTED NOT DETECTED Final    Comment: Performed at [facility]  Culture, blood (Routine X 2)     Status: Abnormal   Collection Time: 06/29/16  8:47 AM  Result Value Ref Range Status   Specimen Description BLOOD LEFT HAND  Final   Special Requests BOTTLES DRAWN  AEROBIC AND ANAEROBIC 5CC  Final   Culture  Setup Time   Final    GRAM POSITIVE COCCI IN CLUSTERS IN BOTH AEROBIC AND ANAEROBIC BOTTLES CRITICAL VALUE NOTED.  VALUE IS CONSISTENT WITH PREVIOUSLY REPORTED AND CALLED VALUE.    Culture (A)  Final    STAPHYLOCOCCUS SPECIES (COAGULASE NEGATIVE) SUSCEPTIBILITIES PERFORMED ON PREVIOUS CULTURE WITHIN THE LAST 5 DAYS. Performed at [facility]    Report Status 07/03/2016 FINAL  Final  Wound or Superficial Culture     Status: None   Collection Time: 06/29/16  8:47 AM  Result Value Ref Range Status   Specimen Description WOUND RIGHT KNEE  Final   Special Requests Normal  Final   Gram Stain   Final    FEW WBC PRESENT, PREDOMINANTLY PMN FEW GRAM POSITIVE COCCI IN PAIRS Performed at [facility]    Culture   Final    MODERATE STAPHYLOCOCCUS SPECIES (COAGULASE NEGATIVE) MODERATE CANDIDA ALBICANS    Report Status 07/03/2016 FINAL  Final   Organism ID, Bacteria STAPHYLOCOCCUS SPECIES (COAGULASE NEGATIVE)  Final      Susceptibility   Staphylococcus species (coagulase negative) - MIC*    CIPROFLOXACIN 2 INTERMEDIATE Intermediate     ERYTHROMYCIN <=0.25 SENSITIVE Sensitive     GENTAMICIN 1 SENSITIVE Sensitive     OXACILLIN >=4 RESISTANT Resistant     TETRACYCLINE <=1 SENSITIVE Sensitive     VANCOMYCIN <=0.5 SENSITIVE Sensitive     TRIMETH/SULFA <=10 SENSITIVE Sensitive     CLINDAMYCIN <=0.25 SENSITIVE Sensitive     RIFAMPIN <=0.5 SENSITIVE Sensitive     Inducible Clindamycin NEGATIVE Sensitive     * MODERATE STAPHYLOCOCCUS SPECIES (COAGULASE NEGATIVE)  Urine C&S     Status: Abnormal   Collection Time: 06/29/16  9:07 AM  Result Value Ref  Range Status   Specimen Description URINE, CLEAN CATCH  Final   Special Requests Normal  Final   Culture 40,000 COLONIES/mL ENTEROBACTER AEROGENES (A)  Final   Report Status 07/01/2016 FINAL  Final   Organism ID, Bacteria ENTEROBACTER AEROGENES (A)  Final      Susceptibility   Enterobacter  aerogenes - MIC*    CEFAZOLIN >=64 RESISTANT Resistant     CEFTRIAXONE >=64 RESISTANT Resistant     CIPROFLOXACIN <=0.25 SENSITIVE Sensitive     GENTAMICIN <=1 SENSITIVE Sensitive     IMIPENEM <=0.25 SENSITIVE Sensitive     NITROFURANTOIN 64 INTERMEDIATE Intermediate     TRIMETH/SULFA <=20 SENSITIVE Sensitive     PIP/TAZO >=128 RESISTANT Resistant     * 40,000 COLONIES/mL ENTEROBACTER AEROGENES  MRSA PCR Screening     Status: None   Collection Time: 06/29/16  1:27 PM  Result Value Ref Range Status   MRSA by PCR NEGATIVE NEGATIVE Final    Comment:        The GeneXpert MRSA Assay (FDA approved for NASAL specimens only), is one component of a comprehensive MRSA colonization surveillance program. It is not intended to diagnose MRSA infection nor to guide or monitor treatment for MRSA infections.   Culture, blood (Routine X 2) w Reflex to ID Panel     Status: None   Collection Time: 06/30/16 12:37 PM  Result Value Ref Range Status   Specimen Description BLOOD LEFT ARM  Final   Special Requests BOTTLES DRAWN AEROBIC ONLY 5CC  Final   Culture   Final    NO GROWTH 5 DAYS Performed at [facility]    Report Status 07/05/2016 FINAL  Final  Culture, blood (Routine X 2) w Reflex to ID Panel     Status: Abnormal   Collection Time: 06/30/16 12:41 PM  Result Value Ref Range Status   Specimen Description BLOOD LEFT HAND  Final   Special Requests IN PEDIATRIC BOTTLE 2CC  Final   Culture  Setup Time   Final    GRAM POSITIVE COCCI IN CLUSTERS AEROBIC BOTTLE ONLY CRITICAL RESULT CALLED TO, READ BACK BY AND VERIFIED WITH: T. PICKERING RPH AT 1209 07/01/16 BY D. VANHOOK    Culture (A)  Final    STAPHYLOCOCCUS SPECIES (COAGULASE NEGATIVE) SUSCEPTIBILITIES PERFORMED ON PREVIOUS CULTURE WITHIN THE LAST 5 DAYS. Performed at [facility]    Report Status 07/03/2016 FINAL  Final  Culture, blood (Routine X 2) w Reflex to ID Panel     Status: None (Preliminary result)   Collection  Time: 07/02/16  7:56 AM  Result Value Ref Range Status   Specimen Description BLOOD LEFT ANTECUBITAL  Final   Special Requests BOTTLES DRAWN AEROBIC AND ANAEROBIC 10CC  Final   Culture   Final    NO GROWTH 3 DAYS Performed at [facility]    Report Status PENDING  Incomplete  Aerobic/Anaerobic Culture (surgical/deep wound)     Status: None (Preliminary result)   Collection Time: 07/05/16  8:04 AM  Result Value Ref Range Status   Specimen Description SYNOVIAL SWAB OF RT KNEE JOINT  Final   Special Requests NONE  Final   Gram Stain   Final    ABUNDANT WBC PRESENT,BOTH PMN AND MONONUCLEAR MODERATE GRAM POSITIVE COCCI IN CLUSTERS    Culture PENDING  Incomplete   Report Status PENDING  Incomplete  Aerobic/Anaerobic Culture (surgical/deep wound)     Status: None (Preliminary result)   Collection Time: 07/05/16  8:10 AM  Result Value Ref Range Status   Specimen Description BONE EXPOSE TIBIA  Final   Special Requests NONE  Final   Gram Stain   Final    MODERATE WBC PRESENT,BOTH PMN AND MONONUCLEAR NO ORGANISMS SEEN    Culture PENDING  Incomplete   Report Status PENDING  Incomplete         Radiology Studies: No results found.    Scheduled Meds: . aspirin  81 mg Oral Daily  . calcium-vitamin D  1 tablet Oral BID  . carbidopa-levodopa  2 tablet Oral 4 times per day  . diclofenac  75 mg Oral BID AC  . enoxaparin (LOVENOX) injection  40 mg Subcutaneous Q24H  . feeding supplement (ENSURE ENLIVE)  237 mL Oral TID BM  . fentaNYL      . finasteride  5 mg Oral Daily  . fluconazole (DIFLUCAN) IV  200 mg Intravenous Q24H  . HYDROmorphone      . insulin aspart  0-20 Units Subcutaneous TID WC  . insulin aspart  0-5 Units Subcutaneous QHS  . [START ON 07/06/2016] insulin glargine  30 Units Subcutaneous Daily  . INTEGRA PLUS  1 capsule Oral q morning - 10a  . linagliptin  5 mg Oral Daily  . mirabegron ER  50 mg Oral Daily  . multivitamin with minerals  1 tablet Oral Daily  .  pantoprazole  40 mg Oral BID  . pioglitazone  45 mg Oral Daily  . rosuvastatin  10 mg Oral Daily  . saccharomyces boulardii  250 mg Oral BID  . tamsulosin  0.4 mg Oral QPC breakfast  . vancomycin  1,000 mg Intravenous Q48H  . vitamin A  10,000 Units Oral Daily  . zinc sulfate  220 mg Oral Daily   Continuous Infusions:     LOS: 6 days    Time spent in minutes: [place], MD [facility] Pager: [email or web address] Password [email or web address] 07/05/2016, 3:06 PM

## 2016-07-05 NOTE — H&P (View-Only) (Signed)
Reason for Consult: right knee wound Referring Physician: Dr. 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  HPI: The patient is an 82 yrs old wm here for evaluation of his right knee.  He underwent a knee replacement and had two dislocations recently.  He was placed in position but now has a wound on the knee area.  This is likely a pressure wound from the brace.  He had an episode of confusion and not feeling well and was brought to [facility].  He is doing much better and eager to have his knee cleaned.  It is ~ 2 x 4 cm on the anterior aspect with soft tissue exposed.  He has a newer wound as well on the lateral aspect that is superficial but not healing well.  Past Medical History  Diagnosis Date  . Diabetes mellitus   . BPH (benign prostatic hyperplasia)   . Arthritis   . GERD (gastroesophageal reflux disease)   . Hypertension     pt denies  . Shortness of breath     uses cpap  . Peripheral vascular disease ([place]) 03-06-12    aortogram showed severe bilateral unreconstructible tibial artery disease  . Failed total knee, right ([place])   . H pylori ulcer     Gastric ulcer  . CAD (coronary artery disease)  s/p CABG     sees Dr. Loralie Champagne   . Aortic stenosis     S/p pericardial valve  . Mobitz type 1 second degree atrioventricular block   . Shingles 05-12-12  . Lumbar spinal stenosis     sees Dr. Sherwood Gambler   . Anemia     sees Dr. Julien Nordmann     Past Surgical History  Procedure Laterality Date  . Aortic valve replacement    . Foot arthrotomy Right   . Tonsillectomy    . Cataract extraction, bilateral    . Humerus fracture surgery    . Replacement total knee Bilateral   . Toe amputation      rt 2nd  . Cardiac catheterization    . Amputation  01/19/2012    Procedure: AMPUTATION DIGIT;  Surgeon: Colin Rhein, MD;  Location: [place];  Service: Orthopedics;  Laterality: Right;  right great toe amputation through MTP joint  . Rotator cuff repair Right   .  Enteroscopy N/A 10/15/2014    Procedure: ENTEROSCOPY;  Surgeon: Gatha Mayer, MD;  Location: [facility];  Service: Endoscopy;  Laterality: N/A;  . Colonoscopy with propofol N/A 10/17/2014    Procedure: COLONOSCOPY WITH PROPOFOL;  Surgeon: Gatha Mayer, MD;  Location: [facility];  Service: Endoscopy;  Laterality: N/A;  . Abdominal aortagram N/A 02/25/2012    Procedure: ABDOMINAL AORTAGRAM;  Surgeon: Elam Dutch, MD;  Location: [facility] CATH LAB;  Service: Cardiovascular;  Laterality: N/A;  . Knee closed reduction N/A 05/29/2016    Procedure: CLOSED MANIPULATION KNEE;  Surgeon: Latanya Maudlin, MD;  Location: [facility];  Service: Orthopedics;  Laterality: N/A;  . Closed reduction patellar Right 06/09/2016    Procedure: CLOSED REDUCTION PATELLAR;  Surgeon: Gaynelle Arabian, MD;  Location: [facility];  Service: Orthopedics;  Laterality: Right;    Family History  Problem Relation Age of Onset  . Diabetes type II Mother   . Diabetes type II Father   . Colon cancer Neg Hx   . Esophageal cancer Neg Hx   . Rectal cancer Neg Hx   . Stomach cancer Neg Hx   .  Heart attack Neg Hx   . Stroke Neg Hx   . Hypertension Mother   . Hypertension Father   . Diabetes Father     Social History:  reports that he has never smoked. He has never used smokeless tobacco. He reports that he drinks about 4.2 oz of alcohol per week. He reports that he does not use illicit drugs.  Allergies:  Allergies  Allergen Reactions  . Ace Inhibitors Other (See Comments)    cough  . Succinylcholine Other (See Comments)    Prolonged sedation  . Morphine And Related Nausea And Vomiting    Medications: I have reviewed the patient's current medications.  Results for orders placed or performed during the hospital encounter of 06/29/16 (from the past 48 hour(s))  Culture, blood (Routine X 2) w Reflex to ID Panel     Status: None (Preliminary result)   Collection Time: 06/30/16 12:37 PM  Result Value Ref Range   Specimen  Description BLOOD LEFT ARM    Special Requests BOTTLES DRAWN AEROBIC ONLY 5CC    Culture      NO GROWTH 1 DAY Performed at [facility]    Report Status PENDING   Culture, blood (Routine X 2) w Reflex to ID Panel     Status: None (Preliminary result)   Collection Time: 06/30/16 12:41 PM  Result Value Ref Range   Specimen Description BLOOD LEFT HAND    Special Requests IN PEDIATRIC BOTTLE 2CC    Culture  Setup Time      GRAM POSITIVE COCCI IN CLUSTERS AEROBIC BOTTLE ONLY CRITICAL RESULT CALLED TO, READ BACK BY AND VERIFIED WITH: T. PICKERING [place] AT 1209 07/01/16 BY D. VANHOOK    Culture      GRAM POSITIVE COCCI IN CLUSTERS CULTURE REINCUBATED FOR BETTER GROWTH Performed at [facility]    Report Status PENDING   Glucose, capillary     Status: Abnormal   Collection Time: 06/30/16  4:44 PM  Result Value Ref Range   Glucose-Capillary 277 (H) 65 - 99 mg/dL  Glucose, capillary     Status: Abnormal   Collection Time: 06/30/16  9:51 PM  Result Value Ref Range   Glucose-Capillary 290 (H) 65 - 99 mg/dL  Prealbumin     Status: Abnormal   Collection Time: 07/01/16  4:45 AM  Result Value Ref Range   Prealbumin 11.0 (L) 18 - 38 mg/dL    Comment: Performed at [place] metabolic panel     Status: Abnormal   Collection Time: 07/01/16  4:45 AM  Result Value Ref Range   Sodium 134 (L) 135 - 145 mmol/L   Potassium 4.4 3.5 - 5.1 mmol/L   Chloride 105 101 - 111 mmol/L   CO2 23 22 - 32 mmol/L   Glucose, Bld 245 (H) 65 - 99 mg/dL   BUN 21 (H) 6 - 20 mg/dL   Creatinine, Ser 0.72 0.61 - 1.24 mg/dL   Calcium 8.2 (L) 8.9 - 10.3 mg/dL   GFR calc non Af Amer >60 >60 mL/min   GFR calc Af Amer >60 >60 mL/min    Comment: (NOTE) The eGFR has been calculated using the CKD EPI equation. This calculation has not been validated in all clinical situations. eGFR's persistently <60 mL/min signify possible Chronic Kidney Disease.    Anion gap 6 5 - 15  Glucose, capillary      Status: Abnormal   Collection Time: 07/01/16  7:39 AM  Result Value Ref  Range   Glucose-Capillary 231 (H) 65 - 99 mg/dL  Glucose, capillary     Status: Abnormal   Collection Time: 07/01/16 12:36 PM  Result Value Ref Range   Glucose-Capillary 316 (H) 65 - 99 mg/dL  Glucose, capillary     Status: Abnormal   Collection Time: 07/01/16  4:53 PM  Result Value Ref Range   Glucose-Capillary 282 (H) 65 - 99 mg/dL  Glucose, capillary     Status: Abnormal   Collection Time: 07/01/16  9:20 PM  Result Value Ref Range   Glucose-Capillary 310 (H) 65 - 99 mg/dL  Basic metabolic panel     Status: Abnormal   Collection Time: 07/02/16  4:40 AM  Result Value Ref Range   Sodium 133 (L) 135 - 145 mmol/L   Potassium 4.5 3.5 - 5.1 mmol/L   Chloride 103 101 - 111 mmol/L   CO2 24 22 - 32 mmol/L   Glucose, Bld 276 (H) 65 - 99 mg/dL   BUN 31 (H) 6 - 20 mg/dL   Creatinine, Ser 0.78 0.61 - 1.24 mg/dL   Calcium 8.3 (L) 8.9 - 10.3 mg/dL   GFR calc non Af Amer >60 >60 mL/min   GFR calc Af Amer >60 >60 mL/min    Comment: (NOTE) The eGFR has been calculated using the CKD EPI equation. This calculation has not been validated in all clinical situations. eGFR's persistently <60 mL/min signify possible Chronic Kidney Disease.    Anion gap 6 5 - 15  CBC     Status: Abnormal   Collection Time: 07/02/16  4:40 AM  Result Value Ref Range   WBC 9.3 4.0 - 10.5 K/uL   RBC 2.80 (L) 4.22 - 5.81 MIL/uL   Hemoglobin 8.9 (L) 13.0 - 17.0 g/dL   HCT 26.5 (L) 39.0 - 52.0 %   MCV 94.6 78.0 - 100.0 fL   MCH 31.8 26.0 - 34.0 pg   MCHC 33.6 30.0 - 36.0 g/dL   RDW 14.5 11.5 - 15.5 %   Platelets 178 150 - 400 K/uL  Glucose, capillary     Status: Abnormal   Collection Time: 07/02/16  7:49 AM  Result Value Ref Range   Glucose-Capillary 242 (H) 65 - 99 mg/dL  Glucose, capillary     Status: Abnormal   Collection Time: 07/02/16 11:56 AM  Result Value Ref Range   Glucose-Capillary 300 (H) 65 - 99 mg/dL    No results  found.  Review of Systems  Constitutional: Positive for fever, chills and malaise/fatigue.  HENT: Negative.   Eyes: Negative.   Respiratory: Negative.   Cardiovascular: Negative.   Gastrointestinal: Negative.   Genitourinary: Negative.   Musculoskeletal: Negative.   Skin: Negative.   Neurological: Positive for weakness.  Psychiatric/Behavioral: Negative.    Blood pressure 139/75, pulse 90, temperature 97.9 F (36.6 C), temperature source Oral, resp. rate 20, height 5' 7"  (1.702 m), weight 78.9 kg (173 lb 15.1 oz), SpO2 97 %. Physical Exam  Constitutional: He is oriented to person, place, and time. He appears well-developed and well-nourished.  HENT:  Head: Normocephalic and atraumatic.  Eyes: EOM are normal. Pupils are equal, round, and reactive to light.  Cardiovascular: Normal rate.   Respiratory: Effort normal. No respiratory distress.  GI: Soft.  Musculoskeletal:       Legs: Neurological: He is alert and oriented to person, place, and time.  Skin: Skin is warm.  Psychiatric: He has a normal mood and affect. His behavior is normal. Judgment and thought  content normal.    Assessment/Plan: Plan for OR this coming Monday for debridement and placement of Acell and possible VAC to right knee. Prealbumin is low and recommend nutrition and dietitian.  Wallace Going 07/02/2016, 12:27 PM

## 2016-07-05 NOTE — Anesthesia Preprocedure Evaluation (Addendum)
Anesthesia Evaluation  Patient identified by MRN, date of birth, ID band Patient awake    Reviewed: Allergy & Precautions, NPO status , Patient's Chart, lab work & pertinent test results  History of Anesthesia Complications (+) PSEUDOCHOLINESTERASE DEFICIENCY and history of anesthetic complications  Airway Mallampati: I  TM Distance: >3 FB Neck ROM: Full    Dental   Pulmonary sleep apnea ,    Pulmonary exam normal        Cardiovascular hypertension, Pt. on medications + CAD  Normal cardiovascular exam+ Valvular Problems/Murmurs AS   2 D ECHO 07/01/16 Study Conclusions  - Left ventricle: The cavity size was normal. Wall thickness was  increased in a pattern of moderate LVH. Systolic function was  normal. The estimated ejection fraction was in the range of 50%  to 55%. Although no diagnostic regional wall motion abnormality  was identified, this possibility cannot be completely excluded on  the basis of this study. - Aortic valve: A bioprosthesis was present and functioning  normally. - Aortic root: The aortic root was mildly dilated. - Mitral valve: There was mild regurgitation. - Left atrium: The atrium was moderately dilated. - Right ventricle: The cavity size was dilated. Wall thickness was  normal. Systolic function was mildly reduced. - Right atrium: The atrium was moderately dilated. - Tricuspid valve: There was mild-moderate regurgitation. - Pulmonary arteries: Systolic pressure was moderately increased.  PA peak pressure: 53 mm Hg (S).   Neuro/Psych    GI/Hepatic   Endo/Other  diabetes, Type 2, Oral Hypoglycemic Agents  Renal/GU      Musculoskeletal   Abdominal   Peds  Hematology   Anesthesia Other Findings   Reproductive/Obstetrics                           Anesthesia Physical Anesthesia Plan  ASA: III  Anesthesia Plan: General   Post-op Pain Management:     Induction: Intravenous  Airway Management Planned: LMA  Additional Equipment:   Intra-op Plan:   Post-operative Plan: Extubation in OR  Informed Consent: I have reviewed the patients History and Physical, chart, labs and discussed the procedure including the risks, benefits and alternatives for the proposed anesthesia with the patient or authorized representative who has indicated his/her understanding and acceptance.     Plan Discussed with: CRNA and Surgeon  Anesthesia Plan Comments:         Anesthesia Quick Evaluation

## 2016-07-05 NOTE — Progress Notes (Signed)
Transport set up with [facility] to go to [facility] for TEE on 07/06/16 at 3 p.m.

## 2016-07-05 NOTE — Transfer of Care (Signed)
Immediate Anesthesia Transfer of Care Note  Patient: Stanley Comas MD  Procedure(s) Performed: Procedure(s): IRRIGATION AND DEBRIDEMENT WOUND RIGHT KNEE  (Right) APPLICATION OF A-CELL OF EXTREMITY (Right)  Patient Location: PACU  Anesthesia Type:General  Level of Consciousness:  sedated, patient cooperative and responds to stimulation  Airway & Oxygen Therapy:Patient Spontanous Breathing and Patient connected to face mask oxgen  Post-op Assessment:  Report given to PACU RN and Post -op Vital signs reviewed and stable  Post vital signs:  Reviewed and stable  Last Vitals:  Filed Vitals:   07/04/16 1530 07/04/16 2253  BP: 132/50 147/49  Pulse: 127 42  Temp: 36.9 C 36.7 C  Resp: 18     Complications: No apparent anesthesia complications

## 2016-07-05 NOTE — Progress Notes (Signed)
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 with  Principal Problem:   Sepsis ([place]) Active Problems:   Hypothyroidism   Diabetes mellitus without complication ([place])   HTN (hypertension)   Iron deficiency anemia   Obstructive sleep apnea   CAD (coronary artery disease) of artery bypass graft   S/P AVR   Chronic systolic CHF (congestive heart failure) (HCC)   Parkinson's disease (HCC)   Pressure ulcer   Pyrexia   Coag negative Staphylococcus bacteremia   Leg wound, right   Prosthetic valve endocarditis (HCC)   Coagulase negative Staphylococcus bacteremia   Septic shock (HCC)   Candida infection  Subjective: Without complaints  Abtx:  Anti-infectives    Start     Dose/Rate Route Frequency Ordered Stop   07/05/16 0813  polymyxin B 500,000 Units, bacitracin 50,000 Units in sodium chloride irrigation 0.9 % 500 mL irrigation  Status:  Discontinued       As needed 07/05/16 0822 07/05/16 0824   07/05/16 0800  vancomycin (VANCOCIN) IVPB 1000 mg/200 mL premix     1,000 mg 200 mL/hr over 60 Minutes Intravenous Every 48 hours 07/05/16 0733     07/04/16 1600  fluconazole (DIFLUCAN) IVPB 400 mg  Status:  Discontinued     400 mg 100 mL/hr over 120 Minutes Intravenous Every 24 hours 07/03/16 1606 07/04/16 1157   07/04/16 1600  fluconazole (DIFLUCAN) IVPB 200 mg     200 mg 100 mL/hr over 60 Minutes Intravenous Every 24 hours 07/04/16 1157     07/03/16 1700  fluconazole (DIFLUCAN) IVPB 800 mg     800 mg 100 mL/hr over 240 Minutes Intravenous  Once 07/03/16 1606 07/03/16 2120   06/29/16 2200  vancomycin (VANCOCIN) IVPB 1000 mg/200 mL premix  Status:  Discontinued     1,000 mg 200 mL/hr over 60 Minutes Intravenous Every 12 hours 06/29/16 0939 07/03/16 1218   06/29/16 1600  piperacillin-tazobactam (ZOSYN) IVPB 3.375 g  Status:  Discontinued     3.375 g 12.5 mL/hr over 240 Minutes Intravenous Every 8 hours 06/29/16 0940 07/01/16 1335   06/29/16 0915   piperacillin-tazobactam (ZOSYN) IVPB 3.375 g     3.375 g 100 mL/hr over 30 Minutes Intravenous  Once 06/29/16 0911 06/29/16 0950   06/29/16 0915  vancomycin (VANCOCIN) IVPB 1000 mg/200 mL premix     1,000 mg 200 mL/hr over 60 Minutes Intravenous  Once 06/29/16 0912 06/29/16 1124      Medications:  Scheduled: . aspirin  81 mg Oral Daily  . calcium-vitamin D  1 tablet Oral BID  . carbidopa-levodopa  2 tablet Oral 4 times per day  . diclofenac  75 mg Oral BID AC  . enoxaparin (LOVENOX) injection  40 mg Subcutaneous Q24H  . feeding supplement (ENSURE ENLIVE)  237 mL Oral TID BM  . fentaNYL      . finasteride  5 mg Oral Daily  . fluconazole (DIFLUCAN) IV  200 mg Intravenous Q24H  . HYDROmorphone      . insulin aspart  0-20 Units Subcutaneous TID WC  . insulin aspart  0-5 Units Subcutaneous QHS  . [START ON 07/06/2016] insulin glargine  25 Units Subcutaneous Daily  . INTEGRA PLUS  1 capsule Oral q morning - 10a  . linagliptin  5 mg Oral Daily  . mirabegron ER  50 mg Oral Daily  . multivitamin with minerals  1 tablet Oral Daily  . pantoprazole  40 mg Oral BID  .  pioglitazone  45 mg Oral Daily  . rosuvastatin  10 mg Oral Daily  . saccharomyces boulardii  250 mg Oral BID  . tamsulosin  0.4 mg Oral QPC breakfast  . vancomycin  1,000 mg Intravenous Q48H  . vitamin A  10,000 Units Oral Daily  . zinc sulfate  220 mg Oral Daily    Objective: Vital signs in last 24 hours: Temp:  [96.7 F (35.9 C)-98 F (36.7 C)] 97.5 F (36.4 C) (07/17 1501) Pulse Rate:  [29-74] 55 (07/17 1501) Resp:  [15-27] 18 (07/17 1501) BP: (110-150)/(49-72) 110/60 mmHg (07/17 1501) SpO2:  [95 %-100 %] 95 % (07/17 1501)   General appearance: alert, cooperative, no distress and pale Resp: clear to auscultation bilaterally Cardio: regular rate and rhythm GI: normal findings: bowel sounds normal and soft, non-tender Extremities: RLE wrapped, VAC in place  Lab Results  Recent Labs  07/03/16 0423  07/04/16 0839  WBC 10.0 9.4  HGB 9.7* 9.1*  HCT 28.9* 27.2*  NA 135 133*  K 4.4 4.5  CL 101 99*  CO2 27 27  BUN 32* 28*  CREATININE 0.86 0.82   Liver Panel No results for input(s): PROT, ALBUMIN, AST, ALT, ALKPHOS, BILITOT, BILIDIR, IBILI in the last 72 hours. Sedimentation Rate No results for input(s): ESRSEDRATE in the last 72 hours. C-Reactive Protein No results for input(s): CRP in the last 72 hours.  Microbiology: Recent Results (from the past 240 hour(s))  Culture, blood (Routine X 2)     Status: Abnormal   Collection Time: 06/29/16  8:45 AM  Result Value Ref Range Status   Specimen Description BLOOD RIGHT ANTECUBITAL  Final   Special Requests BOTTLES DRAWN AEROBIC AND ANAEROBIC 5ML  Final   Culture  Setup Time   Final    GRAM POSITIVE COCCI IN CLUSTERS IN BOTH AEROBIC AND ANAEROBIC BOTTLES CRITICAL RESULT CALLED TO, READ BACK BY AND VERIFIED WITH: MLT T.MULLINS [id number] @0710  MLM CRITICAL RESULT CALLED TO, READ BACK BY AND VERIFIED WITH: SEAY,K. RN AT [phone] 06/30/16 MULLINS,T Performed at [facility]    Culture STAPHYLOCOCCUS SPECIES (COAGULASE NEGATIVE) (A)  Final   Report Status 07/02/2016 FINAL  Final   Organism ID, Bacteria STAPHYLOCOCCUS SPECIES (COAGULASE NEGATIVE)  Final      Susceptibility   Staphylococcus species (coagulase negative) - MIC*    CIPROFLOXACIN 2 INTERMEDIATE Intermediate     ERYTHROMYCIN <=0.25 SENSITIVE Sensitive     GENTAMICIN 1 SENSITIVE Sensitive     OXACILLIN >=4 RESISTANT Resistant     TETRACYCLINE <=1 SENSITIVE Sensitive     VANCOMYCIN <=0.5 SENSITIVE Sensitive     TRIMETH/SULFA <=10 SENSITIVE Sensitive     CLINDAMYCIN <=0.25 SENSITIVE Sensitive     RIFAMPIN <=0.5 SENSITIVE Sensitive     Inducible Clindamycin NEGATIVE Sensitive     * STAPHYLOCOCCUS SPECIES (COAGULASE NEGATIVE)  Blood Culture ID Panel (Reflexed)     Status: None   Collection Time: 06/29/16  8:45 AM  Result Value Ref Range Status   Enterococcus species NOT  DETECTED NOT DETECTED Final   Vancomycin resistance NOT DETECTED NOT DETECTED Final   Listeria monocytogenes NOT DETECTED NOT DETECTED Final   Staphylococcus species NOT DETECTED NOT DETECTED Final   Staphylococcus aureus NOT DETECTED NOT DETECTED Final   Methicillin resistance NOT DETECTED NOT DETECTED Final   Streptococcus species NOT DETECTED NOT DETECTED Final   Streptococcus agalactiae NOT DETECTED NOT DETECTED Final   Streptococcus pneumoniae NOT DETECTED NOT DETECTED Final   Streptococcus pyogenes NOT DETECTED  NOT DETECTED Final   Acinetobacter baumannii NOT DETECTED NOT DETECTED Final   Enterobacteriaceae species NOT DETECTED NOT DETECTED Final   Enterobacter cloacae complex NOT DETECTED NOT DETECTED Final   Escherichia coli NOT DETECTED NOT DETECTED Final   Klebsiella oxytoca NOT DETECTED NOT DETECTED Final   Klebsiella pneumoniae NOT DETECTED NOT DETECTED Final   Proteus species NOT DETECTED NOT DETECTED Final   Serratia marcescens NOT DETECTED NOT DETECTED Final   Carbapenem resistance NOT DETECTED NOT DETECTED Final   Haemophilus influenzae NOT DETECTED NOT DETECTED Final   Neisseria meningitidis NOT DETECTED NOT DETECTED Final   Pseudomonas aeruginosa NOT DETECTED NOT DETECTED Final   Candida albicans NOT DETECTED NOT DETECTED Final   Candida glabrata NOT DETECTED NOT DETECTED Final   Candida krusei NOT DETECTED NOT DETECTED Final   Candida parapsilosis NOT DETECTED NOT DETECTED Final   Candida tropicalis NOT DETECTED NOT DETECTED Final    Comment: Performed at [facility]  Culture, blood (Routine X 2)     Status: Abnormal   Collection Time: 06/29/16  8:47 AM  Result Value Ref Range Status   Specimen Description BLOOD LEFT HAND  Final   Special Requests BOTTLES DRAWN AEROBIC AND ANAEROBIC 5CC  Final   Culture  Setup Time   Final    GRAM POSITIVE COCCI IN CLUSTERS IN BOTH AEROBIC AND ANAEROBIC BOTTLES CRITICAL VALUE NOTED.  VALUE IS CONSISTENT WITH PREVIOUSLY  REPORTED AND CALLED VALUE.    Culture (A)  Final    STAPHYLOCOCCUS SPECIES (COAGULASE NEGATIVE) SUSCEPTIBILITIES PERFORMED ON PREVIOUS CULTURE WITHIN THE LAST 5 DAYS. Performed at [facility]    Report Status 07/03/2016 FINAL  Final  Wound or Superficial Culture     Status: None   Collection Time: 06/29/16  8:47 AM  Result Value Ref Range Status   Specimen Description WOUND RIGHT KNEE  Final   Special Requests Normal  Final   Gram Stain   Final    FEW WBC PRESENT, PREDOMINANTLY PMN FEW GRAM POSITIVE COCCI IN PAIRS Performed at [facility]    Culture   Final    MODERATE STAPHYLOCOCCUS SPECIES (COAGULASE NEGATIVE) MODERATE CANDIDA ALBICANS    Report Status 07/03/2016 FINAL  Final   Organism ID, Bacteria STAPHYLOCOCCUS SPECIES (COAGULASE NEGATIVE)  Final      Susceptibility   Staphylococcus species (coagulase negative) - MIC*    CIPROFLOXACIN 2 INTERMEDIATE Intermediate     ERYTHROMYCIN <=0.25 SENSITIVE Sensitive     GENTAMICIN 1 SENSITIVE Sensitive     OXACILLIN >=4 RESISTANT Resistant     TETRACYCLINE <=1 SENSITIVE Sensitive     VANCOMYCIN <=0.5 SENSITIVE Sensitive     TRIMETH/SULFA <=10 SENSITIVE Sensitive     CLINDAMYCIN <=0.25 SENSITIVE Sensitive     RIFAMPIN <=0.5 SENSITIVE Sensitive     Inducible Clindamycin NEGATIVE Sensitive     * MODERATE STAPHYLOCOCCUS SPECIES (COAGULASE NEGATIVE)  Urine C&S     Status: Abnormal   Collection Time: 06/29/16  9:07 AM  Result Value Ref Range Status   Specimen Description URINE, CLEAN CATCH  Final   Special Requests Normal  Final   Culture 40,000 COLONIES/mL ENTEROBACTER AEROGENES (A)  Final   Report Status 07/01/2016 FINAL  Final   Organism ID, Bacteria ENTEROBACTER AEROGENES (A)  Final      Susceptibility   Enterobacter aerogenes - MIC*    CEFAZOLIN >=64 RESISTANT Resistant     CEFTRIAXONE >=64 RESISTANT Resistant     CIPROFLOXACIN <=0.25 SENSITIVE Sensitive  GENTAMICIN <=1 SENSITIVE Sensitive     IMIPENEM  <=0.25 SENSITIVE Sensitive     NITROFURANTOIN 64 INTERMEDIATE Intermediate     TRIMETH/SULFA <=20 SENSITIVE Sensitive     PIP/TAZO >=128 RESISTANT Resistant     * 40,000 COLONIES/mL ENTEROBACTER AEROGENES  MRSA PCR Screening     Status: None   Collection Time: 06/29/16  1:27 PM  Result Value Ref Range Status   MRSA by PCR NEGATIVE NEGATIVE Final    Comment:        The GeneXpert MRSA Assay (FDA approved for NASAL specimens only), is one component of a comprehensive MRSA colonization surveillance program. It is not intended to diagnose MRSA infection nor to guide or monitor treatment for MRSA infections.   Culture, blood (Routine X 2) w Reflex to ID Panel     Status: None   Collection Time: 06/30/16 12:37 PM  Result Value Ref Range Status   Specimen Description BLOOD LEFT ARM  Final   Special Requests BOTTLES DRAWN AEROBIC ONLY 5CC  Final   Culture   Final    NO GROWTH 5 DAYS Performed at [facility]    Report Status 07/05/2016 FINAL  Final  Culture, blood (Routine X 2) w Reflex to ID Panel     Status: Abnormal   Collection Time: 06/30/16 12:41 PM  Result Value Ref Range Status   Specimen Description BLOOD LEFT HAND  Final   Special Requests IN PEDIATRIC BOTTLE 2CC  Final   Culture  Setup Time   Final    GRAM POSITIVE COCCI IN CLUSTERS AEROBIC BOTTLE ONLY CRITICAL RESULT CALLED TO, READ BACK BY AND VERIFIED WITH: T. PICKERING RPH AT 1209 07/01/16 BY D. VANHOOK    Culture (A)  Final    STAPHYLOCOCCUS SPECIES (COAGULASE NEGATIVE) SUSCEPTIBILITIES PERFORMED ON PREVIOUS CULTURE WITHIN THE LAST 5 DAYS. Performed at [facility]    Report Status 07/03/2016 FINAL  Final  Culture, blood (Routine X 2) w Reflex to ID Panel     Status: None (Preliminary result)   Collection Time: 07/02/16  7:56 AM  Result Value Ref Range Status   Specimen Description BLOOD LEFT ANTECUBITAL  Final   Special Requests BOTTLES DRAWN AEROBIC AND ANAEROBIC 10CC  Final   Culture   Final     NO GROWTH 3 DAYS Performed at [facility]    Report Status PENDING  Incomplete  Aerobic/Anaerobic Culture (surgical/deep wound)     Status: None (Preliminary result)   Collection Time: 07/05/16  8:04 AM  Result Value Ref Range Status   Specimen Description SYNOVIAL SWAB OF RT KNEE JOINT  Final   Special Requests NONE  Final   Gram Stain   Final    ABUNDANT WBC PRESENT,BOTH PMN AND MONONUCLEAR MODERATE GRAM POSITIVE COCCI IN CLUSTERS    Culture PENDING  Incomplete   Report Status PENDING  Incomplete  Aerobic/Anaerobic Culture (surgical/deep wound)     Status: None (Preliminary result)   Collection Time: 07/05/16  8:10 AM  Result Value Ref Range Status   Specimen Description BONE EXPOSE TIBIA  Final   Special Requests NONE  Final   Gram Stain   Final    MODERATE WBC PRESENT,BOTH PMN AND MONONUCLEAR NO ORGANISMS SEEN    Culture PENDING  Incomplete   Report Status PENDING  Incomplete    Studies/Results: No results found.   Assessment/Plan: Septic arthritis Debrided this AM. Wound Cx has grown MRSA and Candida albicans VAC in place Presence of yeast seems a  poor prognostic for his knee   Staph/MRSE bacteremia (7-11 and 7-12)  Repeat BCx 7-14 ngtd Repeat BCx 7-17 pending  Prosthetic Ao valve TEE in AM  DM2 uncontrolled FSG remain elevated.    Total days of antibiotics: 6 vanco, 3 flunconazole         Bobby Rumpf Infectious Diseases (pager) [phone] [email or web address] 07/05/2016, 4:26 PM  LOS: 6 days

## 2016-07-05 NOTE — Progress Notes (Signed)
  Patient is scheduled for TEE 07/06/16 1500 with Dr. Debara Pickett for bacteremia. I explained purpose as well as procedure details including potential risk. All questions and concerns were addressed. Patient gave verbal consent. RN to obtain written consent.   Lyda Jester, PA-C 07/05/2016

## 2016-07-05 NOTE — Brief Op Note (Signed)
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  PRE-OPERATIVE DIAGNOSIS:  RIGHT KNEE WOUND  POST-OPERATIVE DIAGNOSIS:  RIGHT KNEE WOUND  PROCEDURE:  Procedure(s): IRRIGATION AND DEBRIDEMENT WOUND RIGHT KNEE  (Right) APPLICATION OF A-CELL OF EXTREMITY (Right)  SURGEON:  Surgeon(s) and Role:    * Claire S Dillingham, DO - Primary  PHYSICIAN ASSISTANT:   ASSISTANTS: none   ANESTHESIA:   general  EBL:  Total I/O In: 700 [I.V.:700] Out: -   BLOOD ADMINISTERED:none  DRAINS: none   LOCAL MEDICATIONS USED:  NONE  SPECIMEN:  Source of Specimen:  right knee joint and right leg wound- micro  DISPOSITION OF SPECIMEN:  micro and path  COUNTS:  YES  TOURNIQUET:  * No tourniquets in log *  DICTATION: .Dragon Dictation  PLAN OF CARE: Admit to inpatient   PATIENT DISPOSITION:  PACU - hemodynamically stable.   Delay start of Pharmacological VTE agent (>24hrs) due to surgical blood loss or risk of bleeding: no

## 2016-07-06 ENCOUNTER — Inpatient Hospital Stay (HOSPITAL_COMMUNITY): Payer: Medicare Other

## 2016-07-06 ENCOUNTER — Encounter (HOSPITAL_COMMUNITY): Payer: Self-pay | Admitting: *Deleted

## 2016-07-06 ENCOUNTER — Encounter (HOSPITAL_COMMUNITY)
Admission: EM | Disposition: A | Discharge status: Skilled Nursing Facility | Payer: Self-pay | Source: Home / Self Care | Attending: Internal Medicine

## 2016-07-06 ENCOUNTER — Ambulatory Visit (HOSPITAL_COMMUNITY): Admission: RE | Admit: 2016-07-06 | Payer: Medicare Other | Source: Ambulatory Visit | Admitting: Internal Medicine

## 2016-07-06 DIAGNOSIS — I340 Nonrheumatic mitral (valve) insufficiency: Secondary | ICD-10-CM

## 2016-07-06 HISTORY — PX: TEE WITHOUT CARDIOVERSION: SHX5443

## 2016-07-06 LAB — CREATININE, SERUM
Creatinine, Ser: 0.89 mg/dL (ref 0.61–1.24)
GFR calc non Af Amer: >60 mL/min (ref >60)

## 2016-07-06 LAB — GLUCOSE, CAPILLARY
GLUCOSE-CAPILLARY: 131 mg/dL — AB (ref 65–99)
GLUCOSE-CAPILLARY: 320 mg/dL — AB (ref 65–99)
Glucose-Capillary: 126 mg/dL — ABNORMAL HIGH (ref 65–99)
Glucose-Capillary: 128 mg/dL — ABNORMAL HIGH (ref 65–99)

## 2016-07-06 SURGERY — ECHOCARDIOGRAM, TRANSESOPHAGEAL
Anesthesia: Moderate Sedation

## 2016-07-06 MED ORDER — FENTANYL CITRATE (PF) 100 MCG/2ML IJ SOLN
INTRAMUSCULAR | Status: DC | PRN
  Administered 2016-07-06 (×2): 25 ug via INTRAVENOUS

## 2016-07-06 MED ORDER — DEXTROSE-NACL 5-0.9 % IV SOLN
INTRAVENOUS | Status: DC
  Administered 2016-07-06: 12:00:00 via INTRAVENOUS

## 2016-07-06 MED ORDER — POLYETHYLENE GLYCOL 3350 17 G PO PACK
17.0000 g | PACK | Freq: Every day | ORAL | Status: DC | PRN
  Administered 2016-07-07 – 2016-07-08 (×2): 17 g via ORAL
  Filled 2016-07-06: qty 1

## 2016-07-06 MED ORDER — DIPHENHYDRAMINE HCL 50 MG/ML IJ SOLN
INTRAMUSCULAR | Status: AC
  Filled 2016-07-06: qty 1

## 2016-07-06 MED ORDER — MIDAZOLAM HCL 5 MG/ML IJ SOLN
INTRAMUSCULAR | Status: AC
  Filled 2016-07-06: qty 2

## 2016-07-06 MED ORDER — FENTANYL CITRATE (PF) 100 MCG/2ML IJ SOLN
INTRAMUSCULAR | Status: AC
  Filled 2016-07-06: qty 2

## 2016-07-06 MED ORDER — MIDAZOLAM HCL 10 MG/2ML IJ SOLN
INTRAMUSCULAR | Status: DC | PRN
  Administered 2016-07-06: 1 mg via INTRAVENOUS
  Administered 2016-07-06: 2 mg via INTRAVENOUS
  Administered 2016-07-06: 1 mg via INTRAVENOUS

## 2016-07-06 MED ORDER — LIDOCAINE VISCOUS 2 % MT SOLN
OROMUCOSAL | Status: DC | PRN
  Administered 2016-07-06: 10 mL via OROMUCOSAL

## 2016-07-06 MED ORDER — SODIUM CHLORIDE 0.9% FLUSH
10.0000 mL | INTRAVENOUS | Status: DC | PRN
  Administered 2016-07-09: 20 mL
  Administered 2016-07-09: 10 mL
  Filled 2016-07-06 (×2): qty 40

## 2016-07-06 MED ORDER — SODIUM CHLORIDE 0.9 % IV SOLN: INTRAVENOUS | Status: DC

## 2016-07-06 MED ORDER — LIDOCAINE VISCOUS 2 % MT SOLN
OROMUCOSAL | Status: AC
  Filled 2016-07-06: qty 15

## 2016-07-06 NOTE — Progress Notes (Signed)
*  PRELIMINARY RESULTS* Echocardiogram Echocardiogram Transesophageal has been performed.  Leavy Cella 07/06/2016, 4:46 PM

## 2016-07-06 NOTE — CV Procedure (Signed)
TRANSESOPHAGEAL ECHOCARDIOGRAM (TEE) NOTE  INDICATIONS: bioprosthetic valve infective endocarditis  PROCEDURE:   Informed consent was obtained prior to the procedure. The risks, benefits and alternatives for the procedure were discussed and the patient comprehended these risks.  Risks include, but are not limited to, cough, sore throat, vomiting, nausea, somnolence, esophageal and stomach trauma or perforation, bleeding, low blood pressure, aspiration, pneumonia, infection, trauma to the teeth and death.    After a procedural time-out, the patient was given 4 mg versed and 50 mcg fentanyl for moderate sedation.  The patient's heart rate, blood pressure, and oxygen saturation are monitored continuously during the procedure.The oropharynx was anesthetized 10 cc of topical 1% viscous lidocaine and 2 cetacaine sprays.  The transesophageal probe was inserted in the esophagus and stomach without difficulty and multiple views were obtained.  The patient was kept under observation until the patient left the procedure room.  The period of conscious sedation is 19 minutes, of which I was present face-to-face 100% of this time. The patient left the procedure room in stable condition.   Agitated microbubble saline contrast was not administered.  COMPLICATIONS:    There were no immediate complications.  Findings:  1. LEFT VENTRICLE: The left ventricular wall thickness is mildly increased.  The left ventricular cavity is normal in size. Wall motion is normal.  LVEF is 50-55%.  2. RIGHT VENTRICLE:  The right ventricle is normal in structure and function without any thrombus or masses.    3. LEFT ATRIUM:  The left atrium is dilated in size without any thrombus or masses.  There is not spontaneous echo contrast ("smoke") in the left atrium consistent with a low flow state.  4. LEFT ATRIAL APPENDAGE:  The left atrial appendage is free of any thrombus or masses. The appendage has single lobes with a  broccoli shape. Pulse doppler indicates moderate flow in the appendage.  5. ATRIAL SEPTUM:  The atrial septum appears intact and is free of thrombus and/or masses.  There is no evidence for interatrial shunting by color doppler.  6. RIGHT ATRIUM:  The right atrium is normal in size and function without any thrombus or masses.  7. MITRAL VALVE:  The mitral valve is thickened with Moderate regurgitation. ERO was 0.7 cm.  There were no vegetations or stenosis.  8. AORTIC VALVE:  The aortic valve is bioprosthetic with normal leaflet motion. There is no regurgitation.  There were no vegetations or stenosis  9. TRICUSPID VALVE:  The tricuspid valve is normal in structure and function with Moderate regurgitation. The RVSP is 45 mmHg + RAP. There were no vegetations or stenosis  10.  PULMONIC VALVE:  The pulmonic valve is normal in structure and function with no regurgitation.  There were no vegetations or stenosis.   11. AORTIC ARCH, ASCENDING AND DESCENDING AORTA:  There was grade 2 Ron Parker et. Al, 1992) atherosclerosis of the ascending aorta, aortic arch, or proximal descending aorta.  12. PULMONARY VEINS: Anomalous pulmonary venous return was not noted.  13. PERICARDIUM: The pericardium appeared normal and non-thickened.  There is no pericardial effusion.  IMPRESSION:   1. No evidence for prosthetic valve endocarditits 2. No LAA thrombus 3. Negative for PFO by color doppler 4. Moderate MR, TR 5. Low normal LV systolic function  RECOMMENDATIONS:    1.  Continue current antibiotics for bacteremia. No evidence for prosthetic valve endocarditis.  Time Spent Directly with the Patient:  30 minutes   Stanley Casino, MD, [facility] Attending Cardiologist  [facility]  07/06/2016, 3:26 PM

## 2016-07-06 NOTE — H&P (Signed)
     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 The various methods of treatment have been discussed with the patient and family. After consideration of risks, benefits and other options for treatment, the patient has consented to the procedure.  The patients' outpatient history has been reviewed, patient examined, and no change in status from most recent office note within the past 30 days. I have reviewed the patients' chart and labs and will proceed as planned. Questions were answered to the patient's satisfaction.   Pixie Casino, MD, [facility] Attending Cardiologist [place] 07/06/2016, 2:51 PM

## 2016-07-06 NOTE — Progress Notes (Signed)
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 He has been on keflex for the wound. He presents with confusion, fever, increased drainage and incontinence. He has been having frequent falls.   Subjective: No significant knee pain. No new complaints.   Assessment & Plan:   Principal Problem:   Sepsis - fever 101, confusion, knee wound and blood cultures growing coag neg staph- repeat cultures on 7/12 also postiive 1/ 2 sets - has bioprosthetic AVR- ECHO > no vegetations noted- recommended TEE which was done today and does not show endocarditis - ID managing antibiotics - I and D done of knee per plastic surgery  - cultures sent -- third set of blood cultures negative-  PICC ordered    Active Problems:  Right knee dislocation- prosthetic knee - s/p closed reduction x 2  - superficial wound infection- ortho and plastic surgery eval- see above   Diabetes mellitus without complication  - sugars elevated despite poor PO intake- recently received epidural steroid about 1 month ago - resumed Actos and Januvia - increased SSI- added Lantus -  - he is drinking Ensure TID between meals which he prefers over Glucerna and this is exacerbating his sugars further -will be giving 16 U Novolog now at dinner - A1c 7.6 in 3/17- no need to recheck at this admission-  - diabetic diet  Obstructive sleep apnea CPAP    CAD (coronary artery disease) of artery bypass graft - Aspirin  Anemia -  h/o Iron deficiency anemia followed by Dr Julien Nordmann - cont oral iron- given IV Iron on 7/16    S/P AVR    Chronic systolic CHF (congestive heart failure) - stopped IVF  - following I and O    Parkinson's disease -  Sinemet  BPH/ bladder issues? - on Proscar, Flomax and Myrbetriq   DVT prophylaxis: Lovenox Code Status: full code Family Communication:  Disposition Plan: SNF vs LTAC in 2-3 days Consultants:   ID, ortho, plastic surgery Procedures:  2 D ECHO 07/01/16 Study Conclusions  - Left ventricle: The cavity size was normal. Wall thickness was  increased in a pattern of moderate LVH. Systolic function was  normal. The estimated ejection fraction was in the range of 50%  to 55%. Although no diagnostic regional wall motion abnormality  was identified, this possibility cannot be completely excluded on  the basis of this study. - Aortic valve: A bioprosthesis was present and functioning  normally. - Aortic root: The aortic root was mildly dilated. - Mitral valve: There was mild regurgitation. - Left atrium: The atrium was moderately dilated. - Right ventricle: The cavity size was dilated. Wall thickness was  normal. Systolic function was mildly reduced. - Right atrium: The atrium was moderately dilated. - Tricuspid valve: There was mild-moderate regurgitation. - Pulmonary arteries: Systolic pressure was moderately increased.  PA peak pressure: 53 mm Hg (S). Antimicrobials:  Anti-infectives    Start     Dose/Rate Route Frequency Ordered Stop   07/05/16 0813  polymyxin B 500,000 Units, bacitracin 50,000 Units in sodium chloride irrigation 0.9 % 500 mL irrigation  Status:  Discontinued       As needed 07/05/16 0822 07/05/16 0824   07/05/16 0800  vancomycin (  VANCOCIN) IVPB 1000 mg/200 mL premix     1,000 mg 200 mL/hr over 60 Minutes Intravenous Every 48 hours 07/05/16 0733     07/04/16 1600  fluconazole (DIFLUCAN) IVPB 400 mg  Status:  Discontinued     400 mg 100 mL/hr over 120 Minutes Intravenous Every 24 hours 07/03/16 1606 07/04/16 1157   07/04/16 1600  fluconazole (DIFLUCAN) IVPB 200 mg     200 mg 100 mL/hr over 60 Minutes Intravenous Every 24 hours 07/04/16 1157      07/03/16 1700  fluconazole (DIFLUCAN) IVPB 800 mg     800 mg 100 mL/hr over 240 Minutes Intravenous  Once 07/03/16 1606 07/03/16 2120   06/29/16 2200  vancomycin (VANCOCIN) IVPB 1000 mg/200 mL premix  Status:  Discontinued     1,000 mg 200 mL/hr over 60 Minutes Intravenous Every 12 hours 06/29/16 0939 07/03/16 1218   06/29/16 1600  piperacillin-tazobactam (ZOSYN) IVPB 3.375 g  Status:  Discontinued     3.375 g 12.5 mL/hr over 240 Minutes Intravenous Every 8 hours 06/29/16 0940 07/01/16 1335   06/29/16 0915  piperacillin-tazobactam (ZOSYN) IVPB 3.375 g     3.375 g 100 mL/hr over 30 Minutes Intravenous  Once 06/29/16 0911 06/29/16 0950   06/29/16 0915  vancomycin (VANCOCIN) IVPB 1000 mg/200 mL premix     1,000 mg 200 mL/hr over 60 Minutes Intravenous  Once 06/29/16 0912 06/29/16 1124       Objective: Filed Vitals:   07/06/16 1525 07/06/16 1534 07/06/16 1540 07/06/16 1641  BP: 123/82 123/41 123/41 129/54  Pulse: 56 50 54 56  Temp:    97.9 F (36.6 C)  TempSrc:    Oral  Resp: 21 17 15 16   Height:      Weight:      SpO2: 100% 94% 93% 97%    Intake/Output Summary (Last 24 hours) at 07/06/16 1818 Last data filed at 07/06/16 1045  Gross per 24 hour  Intake    240 ml  Output   1750 ml  Net  -1510 ml   Filed Weights   06/29/16 0825 06/29/16 1227  Weight: 81.647 kg (180 lb) 78.9 kg (173 lb 15.1 oz)    Examination: General exam: Appears comfortable  HEENT: PERRLA, oral mucosa moist, no sclera icterus or thrush Respiratory system: Clear to auscultation. Respiratory effort normal. Cardiovascular system: S1 & S2 heard, RRR.  No murmurs  Gastrointestinal system: Abdomen soft, non-tender, nondistended. Normal bowel sound. No organomegaly Central nervous system: Alert and oriented. No focal neurological deficits. Extremities: No cyanosis, clubbing or edema- right knee in dressing Skin: knee in dression Psychiatry:  Mood & affect appropriate.     Data Reviewed: I have  personally reviewed following labs and imaging studies  CBC:  Recent Labs Lab 06/30/16 0357 07/02/16 0440 07/03/16 0423 07/04/16 0839  WBC 10.2 9.3 10.0 9.4  HGB 9.8* 8.9* 9.7* 9.1*  HCT 29.2* 26.5* 28.9* 27.2*  MCV 95.4 94.6 94.4 95.8  PLT 177 178 263 [id number]   Basic Metabolic Panel:  Recent Labs Lab 06/30/16 0357 07/01/16 0445 07/02/16 0440 07/03/16 0423 07/04/16 0839 07/06/16 0431  NA 133* 134* 133* 135 133*  --   K 4.1 4.4 4.5 4.4 4.5  --   CL 103 105 103 101 99*  --   CO2 21* 23 24 27 27   --   GLUCOSE 243* 245* 276* 240* 226*  --   BUN 24* 21* 31* 32* 28*  --   CREATININE 0.74 0.72 0.78 0.86  0.82 0.89  CALCIUM 8.3* 8.2* 8.3* 8.8* 8.4*  --    GFR: Estimated Creatinine Clearance: 53.6 mL/min (by C-G formula based on Cr of 0.89). Liver Function Tests: No results for input(s): AST, ALT, ALKPHOS, BILITOT, PROT, ALBUMIN in the last 168 hours. No results for input(s): LIPASE, AMYLASE in the last 168 hours. No results for input(s): AMMONIA in the last 168 hours. Coagulation Profile: No results for input(s): INR, PROTIME in the last 168 hours. Cardiac Enzymes: No results for input(s): CKTOTAL, CKMB, CKMBINDEX, TROPONINI in the last 168 hours. BNP (last 3 results) No results for input(s): PROBNP in the last 8760 hours. HbA1C: No results for input(s): HGBA1C in the last 72 hours. CBG:  Recent Labs Lab 07/05/16 1643 07/05/16 2052 07/06/16 0743 07/06/16 1210 07/06/16 1659  GLUCAP 306* 241* 126* 131* 128*   Lipid Profile: No results for input(s): CHOL, HDL, LDLCALC, TRIG, CHOLHDL, LDLDIRECT in the last 72 hours. Thyroid Function Tests: No results for input(s): TSH, T4TOTAL, FREET4, T3FREE, THYROIDAB in the last 72 hours. Anemia Panel: No results for input(s): VITAMINB12, FOLATE, FERRITIN, TIBC, IRON, RETICCTPCT in the last 72 hours. Urine analysis:    Component Value Date/Time   COLORURINE YELLOW 06/29/2016 0907   APPEARANCEUR CLOUDY* 06/29/2016 0907    LABSPEC 1.026 06/29/2016 0907   PHURINE 5.5 06/29/2016 0907   GLUCOSEU 100* 06/29/2016 0907   HGBUR NEGATIVE 06/29/2016 0907   HGBUR negative 02/25/2010 0743   BILIRUBINUR NEGATIVE 06/29/2016 0907   BILIRUBINUR n 02/25/2016 1237   KETONESUR NEGATIVE 06/29/2016 0907   PROTEINUR 100* 06/29/2016 0907   PROTEINUR 1+ 02/25/2016 1237   UROBILINOGEN 0.2 02/25/2016 1237   UROBILINOGEN 0.2 08/20/2010 1651   NITRITE NEGATIVE 06/29/2016 0907   NITRITE n 02/25/2016 1237   LEUKOCYTESUR NEGATIVE 06/29/2016 0907   Sepsis Labs: @LABRCNTIP (procalcitonin:4,lacticidven:4) ) Recent Results (from the past 240 hour(s))  Culture, blood (Routine X 2)     Status: Abnormal   Collection Time: 06/29/16  8:45 AM  Result Value Ref Range Status   Specimen Description BLOOD RIGHT ANTECUBITAL  Final   Special Requests BOTTLES DRAWN AEROBIC AND ANAEROBIC 5ML  Final   Culture  Setup Time   Final    GRAM POSITIVE COCCI IN CLUSTERS IN BOTH AEROBIC AND ANAEROBIC BOTTLES CRITICAL RESULT CALLED TO, READ BACK BY AND VERIFIED WITH: MLT T.MULLINS [id number] @0710  MLM CRITICAL RESULT CALLED TO, READ BACK BY AND VERIFIED WITH: SEAY,K. RN AT [id number] 06/30/16 MULLINS,T Performed at [facility]    Culture STAPHYLOCOCCUS SPECIES (COAGULASE NEGATIVE) (A)  Final   Report Status 07/02/2016 FINAL  Final   Organism ID, Bacteria STAPHYLOCOCCUS SPECIES (COAGULASE NEGATIVE)  Final      Susceptibility   Staphylococcus species (coagulase negative) - MIC*    CIPROFLOXACIN 2 INTERMEDIATE Intermediate     ERYTHROMYCIN <=0.25 SENSITIVE Sensitive     GENTAMICIN 1 SENSITIVE Sensitive     OXACILLIN >=4 RESISTANT Resistant     TETRACYCLINE <=1 SENSITIVE Sensitive     VANCOMYCIN <=0.5 SENSITIVE Sensitive     TRIMETH/SULFA <=10 SENSITIVE Sensitive     CLINDAMYCIN <=0.25 SENSITIVE Sensitive     RIFAMPIN <=0.5 SENSITIVE Sensitive     Inducible Clindamycin NEGATIVE Sensitive     * STAPHYLOCOCCUS SPECIES (COAGULASE NEGATIVE)  Blood Culture  ID Panel (Reflexed)     Status: None   Collection Time: 06/29/16  8:45 AM  Result Value Ref Range Status   Enterococcus species NOT DETECTED NOT DETECTED Final   Vancomycin resistance NOT DETECTED NOT  DETECTED Final   Listeria monocytogenes NOT DETECTED NOT DETECTED Final   Staphylococcus species NOT DETECTED NOT DETECTED Final   Staphylococcus aureus NOT DETECTED NOT DETECTED Final   Methicillin resistance NOT DETECTED NOT DETECTED Final   Streptococcus species NOT DETECTED NOT DETECTED Final   Streptococcus agalactiae NOT DETECTED NOT DETECTED Final   Streptococcus pneumoniae NOT DETECTED NOT DETECTED Final   Streptococcus pyogenes NOT DETECTED NOT DETECTED Final   Acinetobacter baumannii NOT DETECTED NOT DETECTED Final   Enterobacteriaceae species NOT DETECTED NOT DETECTED Final   Enterobacter cloacae complex NOT DETECTED NOT DETECTED Final   Escherichia coli NOT DETECTED NOT DETECTED Final   Klebsiella oxytoca NOT DETECTED NOT DETECTED Final   Klebsiella pneumoniae NOT DETECTED NOT DETECTED Final   Proteus species NOT DETECTED NOT DETECTED Final   Serratia marcescens NOT DETECTED NOT DETECTED Final   Carbapenem resistance NOT DETECTED NOT DETECTED Final   Haemophilus influenzae NOT DETECTED NOT DETECTED Final   Neisseria meningitidis NOT DETECTED NOT DETECTED Final   Pseudomonas aeruginosa NOT DETECTED NOT DETECTED Final   Candida albicans NOT DETECTED NOT DETECTED Final   Candida glabrata NOT DETECTED NOT DETECTED Final   Candida krusei NOT DETECTED NOT DETECTED Final   Candida parapsilosis NOT DETECTED NOT DETECTED Final   Candida tropicalis NOT DETECTED NOT DETECTED Final    Comment: Performed at [facility]  Culture, blood (Routine X 2)     Status: Abnormal   Collection Time: 06/29/16  8:47 AM  Result Value Ref Range Status   Specimen Description BLOOD LEFT HAND  Final   Special Requests BOTTLES DRAWN AEROBIC AND ANAEROBIC 5CC  Final   Culture  Setup Time   Final     GRAM POSITIVE COCCI IN CLUSTERS IN BOTH AEROBIC AND ANAEROBIC BOTTLES CRITICAL VALUE NOTED.  VALUE IS CONSISTENT WITH PREVIOUSLY REPORTED AND CALLED VALUE.    Culture (A)  Final    STAPHYLOCOCCUS SPECIES (COAGULASE NEGATIVE) SUSCEPTIBILITIES PERFORMED ON PREVIOUS CULTURE WITHIN THE LAST 5 DAYS. Performed at [facility]    Report Status 07/03/2016 FINAL  Final  Wound or Superficial Culture     Status: None   Collection Time: 06/29/16  8:47 AM  Result Value Ref Range Status   Specimen Description WOUND RIGHT KNEE  Final   Special Requests Normal  Final   Gram Stain   Final    FEW WBC PRESENT, PREDOMINANTLY PMN FEW GRAM POSITIVE COCCI IN PAIRS Performed at [facility]    Culture   Final    MODERATE STAPHYLOCOCCUS SPECIES (COAGULASE NEGATIVE) MODERATE CANDIDA ALBICANS    Report Status 07/03/2016 FINAL  Final   Organism ID, Bacteria STAPHYLOCOCCUS SPECIES (COAGULASE NEGATIVE)  Final      Susceptibility   Staphylococcus species (coagulase negative) - MIC*    CIPROFLOXACIN 2 INTERMEDIATE Intermediate     ERYTHROMYCIN <=0.25 SENSITIVE Sensitive     GENTAMICIN 1 SENSITIVE Sensitive     OXACILLIN >=4 RESISTANT Resistant     TETRACYCLINE <=1 SENSITIVE Sensitive     VANCOMYCIN <=0.5 SENSITIVE Sensitive     TRIMETH/SULFA <=10 SENSITIVE Sensitive     CLINDAMYCIN <=0.25 SENSITIVE Sensitive     RIFAMPIN <=0.5 SENSITIVE Sensitive     Inducible Clindamycin NEGATIVE Sensitive     * MODERATE STAPHYLOCOCCUS SPECIES (COAGULASE NEGATIVE)  Urine C&S     Status: Abnormal   Collection Time: 06/29/16  9:07 AM  Result Value Ref Range Status   Specimen Description URINE, CLEAN CATCH  Final  Special Requests Normal  Final   Culture 40,000 COLONIES/mL ENTEROBACTER AEROGENES (A)  Final   Report Status 07/01/2016 FINAL  Final   Organism ID, Bacteria ENTEROBACTER AEROGENES (A)  Final      Susceptibility   Enterobacter aerogenes - MIC*    CEFAZOLIN >=64 RESISTANT Resistant      CEFTRIAXONE >=64 RESISTANT Resistant     CIPROFLOXACIN <=0.25 SENSITIVE Sensitive     GENTAMICIN <=1 SENSITIVE Sensitive     IMIPENEM <=0.25 SENSITIVE Sensitive     NITROFURANTOIN 64 INTERMEDIATE Intermediate     TRIMETH/SULFA <=20 SENSITIVE Sensitive     PIP/TAZO >=128 RESISTANT Resistant     * 40,000 COLONIES/mL ENTEROBACTER AEROGENES  MRSA PCR Screening     Status: None   Collection Time: 06/29/16  1:27 PM  Result Value Ref Range Status   MRSA by PCR NEGATIVE NEGATIVE Final    Comment:        The GeneXpert MRSA Assay (FDA approved for NASAL specimens only), is one component of a comprehensive MRSA colonization surveillance program. It is not intended to diagnose MRSA infection nor to guide or monitor treatment for MRSA infections.   Culture, blood (Routine X 2) w Reflex to ID Panel     Status: None   Collection Time: 06/30/16 12:37 PM  Result Value Ref Range Status   Specimen Description BLOOD LEFT ARM  Final   Special Requests BOTTLES DRAWN AEROBIC ONLY 5CC  Final   Culture   Final    NO GROWTH 5 DAYS Performed at [facility]    Report Status 07/05/2016 FINAL  Final  Culture, blood (Routine X 2) w Reflex to ID Panel     Status: Abnormal   Collection Time: 06/30/16 12:41 PM  Result Value Ref Range Status   Specimen Description BLOOD LEFT HAND  Final   Special Requests IN PEDIATRIC BOTTLE 2CC  Final   Culture  Setup Time   Final    GRAM POSITIVE COCCI IN CLUSTERS AEROBIC BOTTLE ONLY CRITICAL RESULT CALLED TO, READ BACK BY AND VERIFIED WITH: T. PICKERING RPH AT 1209 07/01/16 BY D. VANHOOK    Culture (A)  Final    STAPHYLOCOCCUS SPECIES (COAGULASE NEGATIVE) SUSCEPTIBILITIES PERFORMED ON PREVIOUS CULTURE WITHIN THE LAST 5 DAYS. Performed at [facility]    Report Status 07/03/2016 FINAL  Final  Culture, blood (Routine X 2) w Reflex to ID Panel     Status: None (Preliminary result)   Collection Time: 07/02/16  7:56 AM  Result Value Ref Range Status    Specimen Description BLOOD LEFT ANTECUBITAL  Final   Special Requests BOTTLES DRAWN AEROBIC AND ANAEROBIC 10CC  Final   Culture   Final    NO GROWTH 4 DAYS Performed at [facility]    Report Status PENDING  Incomplete  Aerobic/Anaerobic Culture (surgical/deep wound)     Status: None (Preliminary result)   Collection Time: 07/05/16  8:04 AM  Result Value Ref Range Status   Specimen Description SYNOVIAL SWAB OF RT KNEE JOINT  Final   Special Requests NONE  Final   Gram Stain   Final    ABUNDANT WBC PRESENT,BOTH PMN AND MONONUCLEAR MODERATE GRAM POSITIVE COCCI IN CLUSTERS    Culture   Final    CULTURE REINCUBATED FOR BETTER GROWTH Performed at [facility]    Report Status PENDING  Incomplete  Aerobic/Anaerobic Culture (surgical/deep wound)     Status: None (Preliminary result)   Collection Time: 07/05/16  8:10 AM  Result Value Ref Range Status   Specimen Description BONE EXPOSE TIBIA  Final   Special Requests NONE  Final   Gram Stain   Final    MODERATE WBC PRESENT,BOTH PMN AND MONONUCLEAR NO ORGANISMS SEEN    Culture   Final    NO GROWTH 1 DAY Performed at [facility]    Report Status PENDING  Incomplete         Radiology Studies: No results found.    Scheduled Meds: . aspirin  81 mg Oral Daily  . calcium-vitamin D  1 tablet Oral BID  . carbidopa-levodopa  2 tablet Oral 4 times per day  . diclofenac  75 mg Oral BID AC  . enoxaparin (LOVENOX) injection  40 mg Subcutaneous Q24H  . feeding supplement (ENSURE ENLIVE)  237 mL Oral TID BM  . finasteride  5 mg Oral Daily  . fluconazole (DIFLUCAN) IV  200 mg Intravenous Q24H  . insulin aspart  0-20 Units Subcutaneous TID WC  . insulin aspart  0-5 Units Subcutaneous QHS  . insulin glargine  25 Units Subcutaneous Daily  . INTEGRA PLUS  1 capsule Oral q morning - 10a  . linagliptin  5 mg Oral Daily  . mirabegron ER  50 mg Oral Daily  . multivitamin with minerals  1 tablet Oral Daily  .  pantoprazole  40 mg Oral BID  . pioglitazone  45 mg Oral Daily  . rosuvastatin  10 mg Oral Daily  . tamsulosin  0.4 mg Oral QPC breakfast  . vancomycin  1,000 mg Intravenous Q48H  . vitamin A  10,000 Units Oral Daily  . zinc sulfate  220 mg Oral Daily   Continuous Infusions:     LOS: 7 days    Time spent in minutes: [place], MD [facility] Pager: [email or web address] Password [email or web address] 07/06/2016, 6:18 PM

## 2016-07-06 NOTE — Care Management Important Message (Signed)
Important Message  Patient Details  Name: Stanley LAMIA MD MRN: [id number] Date of Birth: [birth year]   Medicare Important Message Given:  Yes    Camillo Flaming 07/06/2016, 10:09 AMImportant Message  Patient Details  Name: Stanley HOLTHAUS MD MRN: [id number] Date of Birth: 10-25-1925   Medicare Important Message Given:  Yes    Camillo Flaming 07/06/2016, 10:09 AM

## 2016-07-06 NOTE — Progress Notes (Signed)
Physical Therapy Treatment Patient Details Name: Stanley KINKADE MD MRN: [id number] DOB: March 13, 1925 Today's Date: 07/06/2016    History of Present Illness 82 y.o. male with medical history significant of DM, PVD, CAD, S/P CABG, aortic stenosis, S/P AVR,chronic systolic CHF and Parkinson's disease who was admitted with confusion, fever, draining R knee wound, falls at home; sepsis .  Pt with hx of R TKA, R TKR requiring closed manipulation on 05/29/16 and then closed reduction of the patella on 06/09/16. Pt s/p R knee I & D with wound vac placement 07/05/16.    PT Comments    Pt s/p I & D and wound vac placement yesterday.  He was agreeable to stand and do side steps at EOB, but not ambulate forward.  Discussed next session that he is moving well enough to work on gait.  Educated on therex in supine to prevent strength loss.  Follow Up Recommendations  Supervision/Assistance - 24 hour;SNF     Equipment Recommendations  None recommended by PT    Recommendations for Other Services       Precautions / Restrictions Precautions Precautions: Fall Required Braces or Orthoses: Knee Immobilizer - Right Knee Immobilizer - Right: On except when in CPM;Other (comment) (or with PT) Restrictions Weight Bearing Restrictions: No    Mobility  Bed Mobility Overal bed mobility: Needs Assistance Bed Mobility: Supine to Sit     Supine to sit: Mod assist     General bed mobility comments: A for getting trunk upright.  Can scoot to EOB, but needs cueing and increased time  Transfers Overall transfer level: Needs assistance Equipment used: Rolling walker (2 wheeled) Transfers: Sit to/from Stand Sit to Stand: Min assist;+2 physical assistance         General transfer comment: cues for hand placement  Ambulation/Gait Ambulation/Gait assistance: Min assist;+2 safety/equipment Ambulation Distance (Feet): 10 Feet (sidestepping) Assistive device: Rolling walker (2 wheeled) Gait  Pattern/deviations: Step-to pattern;Antalgic     General Gait Details: Side stepping in front of bed to L and R.  Cues for WB through UE when R LE WB. Pt did not want to attempt forward gait.   Stairs            Wheelchair Mobility    Modified Rankin (Stroke Patients Only)       Balance                                    Cognition Arousal/Alertness: Awake/alert Behavior During Therapy: WFL for tasks assessed/performed Overall Cognitive Status: Within Functional Limits for tasks assessed                      Exercises General Exercises - Lower Extremity Ankle Circles/Pumps: Both;10 reps Quad Sets: Strengthening;Right;10 reps;Supine Gluteal Sets: Strengthening;10 reps;Supine Heel Slides: AAROM;Other (comment) (in limited ROm to avoid pain and any stress) Hip ABduction/ADduction: AAROM;Right;10 reps;Supine    General Comments        Pertinent Vitals/Pain Pain Assessment: Faces Faces Pain Scale: Hurts little more Pain Location: R knee Pain Descriptors / Indicators: Grimacing Pain Intervention(s): Limited activity within patient's tolerance;Monitored during session;Repositioned    Home Living                      Prior Function            PT Goals (current goals can now be found in the care plan  section) Acute Rehab PT Goals PT Goal Formulation: With patient Time For Goal Achievement: 07/15/16 Potential to Achieve Goals: Good Progress towards PT goals: Progressing toward goals    Frequency  Min 3X/week    PT Plan Current plan remains appropriate    Co-evaluation             End of Session Equipment Utilized During Treatment: Gait belt Activity Tolerance: Patient tolerated treatment well Patient left: in bed;with call bell/phone within reach;with bed alarm set;Other (comment) (Pt awaiting transport to go to [facility] for a test)     Time: 1107-1130 PT Time Calculation (min) (ACUTE ONLY): 23 min  Charges:  $Gait  Training: 8-22 mins $Therapeutic Exercise: 8-22 mins                    G Codes:      Zong Mcquarrie LUBECK 07/06/2016, 11:57 AM

## 2016-07-06 NOTE — Progress Notes (Signed)
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 with  Principal Problem:   Sepsis ([place]) Active Problems:   Hypothyroidism   Diabetes mellitus without complication ([place])   HTN (hypertension)   Iron deficiency anemia   Obstructive sleep apnea   CAD (coronary artery disease) of artery bypass graft   S/P AVR   Chronic systolic CHF (congestive heart failure) (HCC)   Parkinson's disease (HCC)   Pressure ulcer   Pyrexia   Coag negative Staphylococcus bacteremia   Leg wound, right   Prosthetic valve endocarditis (HCC)   Coagulase negative Staphylococcus bacteremia   Septic shock (HCC)   Candida infection  Subjective: Without complaints, has been up out of bed with PT  Abtx:  Anti-infectives    Start     Dose/Rate Route Frequency Ordered Stop   07/05/16 0813  polymyxin B 500,000 Units, bacitracin 50,000 Units in sodium chloride irrigation 0.9 % 500 mL irrigation  Status:  Discontinued       As needed 07/05/16 0822 07/05/16 0824   07/05/16 0800  vancomycin (VANCOCIN) IVPB 1000 mg/200 mL premix     1,000 mg 200 mL/hr over 60 Minutes Intravenous Every 48 hours 07/05/16 0733     07/04/16 1600  fluconazole (DIFLUCAN) IVPB 400 mg  Status:  Discontinued     400 mg 100 mL/hr over 120 Minutes Intravenous Every 24 hours 07/03/16 1606 07/04/16 1157   07/04/16 1600  fluconazole (DIFLUCAN) IVPB 200 mg     200 mg 100 mL/hr over 60 Minutes Intravenous Every 24 hours 07/04/16 1157     07/03/16 1700  fluconazole (DIFLUCAN) IVPB 800 mg     800 mg 100 mL/hr over 240 Minutes Intravenous  Once 07/03/16 1606 07/03/16 2120   06/29/16 2200  vancomycin (VANCOCIN) IVPB 1000 mg/200 mL premix  Status:  Discontinued     1,000 mg 200 mL/hr over 60 Minutes Intravenous Every 12 hours 06/29/16 0939 07/03/16 1218   06/29/16 1600  piperacillin-tazobactam (ZOSYN) IVPB 3.375 g  Status:  Discontinued     3.375 g 12.5 mL/hr over 240 Minutes Intravenous Every 8 hours 06/29/16 0940  07/01/16 1335   06/29/16 0915  piperacillin-tazobactam (ZOSYN) IVPB 3.375 g     3.375 g 100 mL/hr over 30 Minutes Intravenous  Once 06/29/16 0911 06/29/16 0950   06/29/16 0915  vancomycin (VANCOCIN) IVPB 1000 mg/200 mL premix     1,000 mg 200 mL/hr over 60 Minutes Intravenous  Once 06/29/16 0912 06/29/16 1124      Medications:  Scheduled: . aspirin  81 mg Oral Daily  . calcium-vitamin D  1 tablet Oral BID  . carbidopa-levodopa  2 tablet Oral 4 times per day  . diclofenac  75 mg Oral BID AC  . enoxaparin (LOVENOX) injection  40 mg Subcutaneous Q24H  . feeding supplement (ENSURE ENLIVE)  237 mL Oral TID BM  . finasteride  5 mg Oral Daily  . fluconazole (DIFLUCAN) IV  200 mg Intravenous Q24H  . insulin aspart  0-20 Units Subcutaneous TID WC  . insulin aspart  0-5 Units Subcutaneous QHS  . insulin glargine  25 Units Subcutaneous Daily  . INTEGRA PLUS  1 capsule Oral q morning - 10a  . linagliptin  5 mg Oral Daily  . mirabegron ER  50 mg Oral Daily  . multivitamin with minerals  1 tablet Oral Daily  . pantoprazole  40 mg Oral BID  . pioglitazone  45 mg Oral Daily  .  rosuvastatin  10 mg Oral Daily  . tamsulosin  0.4 mg Oral QPC breakfast  . vancomycin  1,000 mg Intravenous Q48H  . vitamin A  10,000 Units Oral Daily  . zinc sulfate  220 mg Oral Daily    Objective: Vital signs in last 24 hours: Temp:  [97.5 F (36.4 C)-98.2 F (36.8 C)] 98.2 F (36.8 C) (07/18 0459) Pulse Rate:  [55-59] 55 (07/18 0459) Resp:  [18] 18 (07/18 0459) BP: (110-120)/(40-61) 114/40 mmHg (07/18 0459) SpO2:  [95 %-100 %] 95 % (07/18 0459)   General appearance: alert, cooperative and no distress Resp: clear to auscultation bilaterally Cardio: regular rate and rhythm GI: normal findings: bowel sounds normal and soft, non-tender Extremities: RLE wrapped, VAC  Lab Results  Recent Labs  07/04/16 0839 07/06/16 0431  WBC 9.4  --   HGB 9.1*  --   HCT 27.2*  --   NA 133*  --   K 4.5  --   CL  99*  --   CO2 27  --   BUN 28*  --   CREATININE 0.82 0.89   Liver Panel No results for input(s): PROT, ALBUMIN, AST, ALT, ALKPHOS, BILITOT, BILIDIR, IBILI in the last 72 hours. Sedimentation Rate No results for input(s): ESRSEDRATE in the last 72 hours. C-Reactive Protein No results for input(s): CRP in the last 72 hours.  Microbiology: Recent Results (from the past 240 hour(s))  Culture, blood (Routine X 2)     Status: Abnormal   Collection Time: 06/29/16  8:45 AM  Result Value Ref Range Status   Specimen Description BLOOD RIGHT ANTECUBITAL  Final   Special Requests BOTTLES DRAWN AEROBIC AND ANAEROBIC 5ML  Final   Culture  Setup Time   Final    GRAM POSITIVE COCCI IN CLUSTERS IN BOTH AEROBIC AND ANAEROBIC BOTTLES CRITICAL RESULT CALLED TO, READ BACK BY AND VERIFIED WITH: MLT T.MULLINS [id number] @0710  MLM CRITICAL RESULT CALLED TO, READ BACK BY AND VERIFIED WITH: SEAY,K. RN AT QN:[id number] 06/30/16 MULLINS,T Performed at [facility]    Culture STAPHYLOCOCCUS SPECIES (COAGULASE NEGATIVE) (A)  Final   Report Status 07/02/2016 FINAL  Final   Organism ID, Bacteria STAPHYLOCOCCUS SPECIES (COAGULASE NEGATIVE)  Final      Susceptibility   Staphylococcus species (coagulase negative) - MIC*    CIPROFLOXACIN 2 INTERMEDIATE Intermediate     ERYTHROMYCIN <=0.25 SENSITIVE Sensitive     GENTAMICIN 1 SENSITIVE Sensitive     OXACILLIN >=4 RESISTANT Resistant     TETRACYCLINE <=1 SENSITIVE Sensitive     VANCOMYCIN <=0.5 SENSITIVE Sensitive     TRIMETH/SULFA <=10 SENSITIVE Sensitive     CLINDAMYCIN <=0.25 SENSITIVE Sensitive     RIFAMPIN <=0.5 SENSITIVE Sensitive     Inducible Clindamycin NEGATIVE Sensitive     * STAPHYLOCOCCUS SPECIES (COAGULASE NEGATIVE)  Blood Culture ID Panel (Reflexed)     Status: None   Collection Time: 06/29/16  8:45 AM  Result Value Ref Range Status   Enterococcus species NOT DETECTED NOT DETECTED Final   Vancomycin resistance NOT DETECTED NOT DETECTED Final    Listeria monocytogenes NOT DETECTED NOT DETECTED Final   Staphylococcus species NOT DETECTED NOT DETECTED Final   Staphylococcus aureus NOT DETECTED NOT DETECTED Final   Methicillin resistance NOT DETECTED NOT DETECTED Final   Streptococcus species NOT DETECTED NOT DETECTED Final   Streptococcus agalactiae NOT DETECTED NOT DETECTED Final   Streptococcus pneumoniae NOT DETECTED NOT DETECTED Final   Streptococcus pyogenes NOT DETECTED NOT DETECTED Final  Acinetobacter baumannii NOT DETECTED NOT DETECTED Final   Enterobacteriaceae species NOT DETECTED NOT DETECTED Final   Enterobacter cloacae complex NOT DETECTED NOT DETECTED Final   Escherichia coli NOT DETECTED NOT DETECTED Final   Klebsiella oxytoca NOT DETECTED NOT DETECTED Final   Klebsiella pneumoniae NOT DETECTED NOT DETECTED Final   Proteus species NOT DETECTED NOT DETECTED Final   Serratia marcescens NOT DETECTED NOT DETECTED Final   Carbapenem resistance NOT DETECTED NOT DETECTED Final   Haemophilus influenzae NOT DETECTED NOT DETECTED Final   Neisseria meningitidis NOT DETECTED NOT DETECTED Final   Pseudomonas aeruginosa NOT DETECTED NOT DETECTED Final   Candida albicans NOT DETECTED NOT DETECTED Final   Candida glabrata NOT DETECTED NOT DETECTED Final   Candida krusei NOT DETECTED NOT DETECTED Final   Candida parapsilosis NOT DETECTED NOT DETECTED Final   Candida tropicalis NOT DETECTED NOT DETECTED Final    Comment: Performed at [facility]  Culture, blood (Routine X 2)     Status: Abnormal   Collection Time: 06/29/16  8:47 AM  Result Value Ref Range Status   Specimen Description BLOOD LEFT HAND  Final   Special Requests BOTTLES DRAWN AEROBIC AND ANAEROBIC 5CC  Final   Culture  Setup Time   Final    GRAM POSITIVE COCCI IN CLUSTERS IN BOTH AEROBIC AND ANAEROBIC BOTTLES CRITICAL VALUE NOTED.  VALUE IS CONSISTENT WITH PREVIOUSLY REPORTED AND CALLED VALUE.    Culture (A)  Final    STAPHYLOCOCCUS SPECIES (COAGULASE  NEGATIVE) SUSCEPTIBILITIES PERFORMED ON PREVIOUS CULTURE WITHIN THE LAST 5 DAYS. Performed at [facility]    Report Status 07/03/2016 FINAL  Final  Wound or Superficial Culture     Status: None   Collection Time: 06/29/16  8:47 AM  Result Value Ref Range Status   Specimen Description WOUND RIGHT KNEE  Final   Special Requests Normal  Final   Gram Stain   Final    FEW WBC PRESENT, PREDOMINANTLY PMN FEW GRAM POSITIVE COCCI IN PAIRS Performed at [facility]    Culture   Final    MODERATE STAPHYLOCOCCUS SPECIES (COAGULASE NEGATIVE) MODERATE CANDIDA ALBICANS    Report Status 07/03/2016 FINAL  Final   Organism ID, Bacteria STAPHYLOCOCCUS SPECIES (COAGULASE NEGATIVE)  Final      Susceptibility   Staphylococcus species (coagulase negative) - MIC*    CIPROFLOXACIN 2 INTERMEDIATE Intermediate     ERYTHROMYCIN <=0.25 SENSITIVE Sensitive     GENTAMICIN 1 SENSITIVE Sensitive     OXACILLIN >=4 RESISTANT Resistant     TETRACYCLINE <=1 SENSITIVE Sensitive     VANCOMYCIN <=0.5 SENSITIVE Sensitive     TRIMETH/SULFA <=10 SENSITIVE Sensitive     CLINDAMYCIN <=0.25 SENSITIVE Sensitive     RIFAMPIN <=0.5 SENSITIVE Sensitive     Inducible Clindamycin NEGATIVE Sensitive     * MODERATE STAPHYLOCOCCUS SPECIES (COAGULASE NEGATIVE)  Urine C&S     Status: Abnormal   Collection Time: 06/29/16  9:07 AM  Result Value Ref Range Status   Specimen Description URINE, CLEAN CATCH  Final   Special Requests Normal  Final   Culture 40,000 COLONIES/mL ENTEROBACTER AEROGENES (A)  Final   Report Status 07/01/2016 FINAL  Final   Organism ID, Bacteria ENTEROBACTER AEROGENES (A)  Final      Susceptibility   Enterobacter aerogenes - MIC*    CEFAZOLIN >=64 RESISTANT Resistant     CEFTRIAXONE >=64 RESISTANT Resistant     CIPROFLOXACIN <=0.25 SENSITIVE Sensitive     GENTAMICIN <=1 SENSITIVE  Sensitive     IMIPENEM <=0.25 SENSITIVE Sensitive     NITROFURANTOIN 64 INTERMEDIATE Intermediate      TRIMETH/SULFA <=20 SENSITIVE Sensitive     PIP/TAZO >=128 RESISTANT Resistant     * 40,000 COLONIES/mL ENTEROBACTER AEROGENES  MRSA PCR Screening     Status: None   Collection Time: 06/29/16  1:27 PM  Result Value Ref Range Status   MRSA by PCR NEGATIVE NEGATIVE Final    Comment:        The GeneXpert MRSA Assay (FDA approved for NASAL specimens only), is one component of a comprehensive MRSA colonization surveillance program. It is not intended to diagnose MRSA infection nor to guide or monitor treatment for MRSA infections.   Culture, blood (Routine X 2) w Reflex to ID Panel     Status: None   Collection Time: 06/30/16 12:37 PM  Result Value Ref Range Status   Specimen Description BLOOD LEFT ARM  Final   Special Requests BOTTLES DRAWN AEROBIC ONLY 5CC  Final   Culture   Final    NO GROWTH 5 DAYS Performed at [facility]    Report Status 07/05/2016 FINAL  Final  Culture, blood (Routine X 2) w Reflex to ID Panel     Status: Abnormal   Collection Time: 06/30/16 12:41 PM  Result Value Ref Range Status   Specimen Description BLOOD LEFT HAND  Final   Special Requests IN PEDIATRIC BOTTLE 2CC  Final   Culture  Setup Time   Final    GRAM POSITIVE COCCI IN CLUSTERS AEROBIC BOTTLE ONLY CRITICAL RESULT CALLED TO, READ BACK BY AND VERIFIED WITH: T. PICKERING RPH AT 1209 07/01/16 BY D. VANHOOK    Culture (A)  Final    STAPHYLOCOCCUS SPECIES (COAGULASE NEGATIVE) SUSCEPTIBILITIES PERFORMED ON PREVIOUS CULTURE WITHIN THE LAST 5 DAYS. Performed at [facility]    Report Status 07/03/2016 FINAL  Final  Culture, blood (Routine X 2) w Reflex to ID Panel     Status: None (Preliminary result)   Collection Time: 07/02/16  7:56 AM  Result Value Ref Range Status   Specimen Description BLOOD LEFT ANTECUBITAL  Final   Special Requests BOTTLES DRAWN AEROBIC AND ANAEROBIC 10CC  Final   Culture   Final    NO GROWTH 3 DAYS Performed at [facility]    Report Status PENDING   Incomplete  Aerobic/Anaerobic Culture (surgical/deep wound)     Status: None (Preliminary result)   Collection Time: 07/05/16  8:04 AM  Result Value Ref Range Status   Specimen Description SYNOVIAL SWAB OF RT KNEE JOINT  Final   Special Requests NONE  Final   Gram Stain   Final    ABUNDANT WBC PRESENT,BOTH PMN AND MONONUCLEAR MODERATE GRAM POSITIVE COCCI IN CLUSTERS    Culture PENDING  Incomplete   Report Status PENDING  Incomplete  Aerobic/Anaerobic Culture (surgical/deep wound)     Status: None (Preliminary result)   Collection Time: 07/05/16  8:10 AM  Result Value Ref Range Status   Specimen Description BONE EXPOSE TIBIA  Final   Special Requests NONE  Final   Gram Stain   Final    MODERATE WBC PRESENT,BOTH PMN AND MONONUCLEAR NO ORGANISMS SEEN    Culture PENDING  Incomplete   Report Status PENDING  Incomplete    Studies/Results: No results found.   Assessment/Plan: Septic arthritis Debrided this AM. Wound Cx has grown MRSA and Candida albicans VAC in place Presence of yeast seems a poor prognostic for  his knee  Staph/MRSE bacteremia (7-11 and 7-12)  Repeat BCx 7-14 ngtd Repeat BCx 7-17 1/2 GPC I am very concerned about his persistently positive BCx Await his TEE.  He may need further eval of his knee.   Prosthetic Ao valve TEE today  DM2 uncontrolled FSG remain elevated.   Total days of antibiotics: 7 vanco, 4 flunconazole         Bobby Rumpf Infectious Diseases (pager) [phone] [email or web address] 07/06/2016, 11:23 AM  LOS: 7 days

## 2016-07-06 NOTE — Progress Notes (Signed)
Peripherally Inserted Central Catheter/Midline Placement  The IV Nurse has discussed with the patient and/or persons authorized to consent for the patient, the purpose of this procedure and the potential benefits and risks involved with this procedure.  The benefits include less needle sticks, lab draws from the catheter and patient may be discharged home with the catheter.  Risks include, but not limited to, infection, bleeding, blood clot (thrombus formation), and puncture of an artery; nerve damage and irregular heat beat.  Alternatives to this procedure were also discussed.  PICC/Midline Placement Documentation        Stanley [name] 07/06/2016, 9:44 AM

## 2016-07-06 NOTE — Consult Note (Signed)
   [facility] CM Inpatient Consult   07/06/2016  [name] MD 09-30-1925 [id number]   [facility] Care Management follow up. Chart reviewed. Noted discharge plan is for SNF.  There are no identifiable [facility] Care Management needs at this time. If post hospital needs change, please place a [place] Management consult. For questions please contact:  Marthenia Rolling, MSN-Ed, RN,BSN [facility] Liaison [phone], [place], RN,BSN [facility] Liaison [phone]

## 2016-07-07 ENCOUNTER — Encounter (HOSPITAL_COMMUNITY): Payer: Self-pay | Admitting: Internal Medicine

## 2016-07-07 DIAGNOSIS — I10 Essential (primary) hypertension: Secondary | ICD-10-CM

## 2016-07-07 DIAGNOSIS — R7881 Bacteremia: Secondary | ICD-10-CM

## 2016-07-07 DIAGNOSIS — Z954 Presence of other heart-valve replacement: Secondary | ICD-10-CM

## 2016-07-07 DIAGNOSIS — A419 Sepsis, unspecified organism: Principal | ICD-10-CM

## 2016-07-07 DIAGNOSIS — R6521 Severe sepsis with septic shock: Secondary | ICD-10-CM

## 2016-07-07 DIAGNOSIS — N390 Urinary tract infection, site not specified: Secondary | ICD-10-CM

## 2016-07-07 DIAGNOSIS — S81801D Unspecified open wound, right lower leg, subsequent encounter: Secondary | ICD-10-CM

## 2016-07-07 LAB — CBC
HEMATOCRIT: 27 % — AB (ref 39.0–52.0)
HEMOGLOBIN: 8.8 g/dL — AB (ref 13.0–17.0)
MCH: 31.4 pg (ref 26.0–34.0)
MCHC: 32.6 g/dL (ref 30.0–36.0)
MCV: 96.4 fL (ref 78.0–100.0)
Platelets: 385 10*3/uL (ref 150–400)
RBC: 2.8 MIL/uL — AB (ref 4.22–5.81)
RDW: 15 % (ref 11.5–15.5)
WBC: 12.6 10*3/uL — AB (ref 4.0–10.5)

## 2016-07-07 LAB — BASIC METABOLIC PANEL
ANION GAP: 7 (ref 5–15)
BUN: 29 mg/dL — ABNORMAL HIGH (ref 6–20)
CO2: 27 mmol/L (ref 22–32)
Calcium: 8.2 mg/dL — ABNORMAL LOW (ref 8.9–10.3)
Chloride: 102 mmol/L (ref 101–111)
Creatinine, Ser: 0.85 mg/dL (ref 0.61–1.24)
GLUCOSE: 146 mg/dL — AB (ref 65–99)
POTASSIUM: 4.6 mmol/L (ref 3.5–5.1)
Sodium: 136 mmol/L (ref 135–145)

## 2016-07-07 LAB — GLUCOSE, CAPILLARY
GLUCOSE-CAPILLARY: 133 mg/dL — AB (ref 65–99)
GLUCOSE-CAPILLARY: 382 mg/dL — AB (ref 65–99)
GLUCOSE-CAPILLARY: 127 mg/dL — AB (ref 65–99)
GLUCOSE-CAPILLARY: 372 mg/dL — AB (ref 65–99)

## 2016-07-07 LAB — CULTURE, BLOOD (ROUTINE X 2): Culture: NO GROWTH

## 2016-07-07 LAB — VANCOMYCIN, TROUGH: Vancomycin Tr: 10 ug/mL — ABNORMAL LOW (ref 15–20)

## 2016-07-07 MED ORDER — VANCOMYCIN HCL IN DEXTROSE 1-5 GM/200ML-% IV SOLN
1000.0000 mg | INTRAVENOUS | Status: DC
  Administered 2016-07-07 – 2016-07-08 (×2): 1000 mg via INTRAVENOUS
  Filled 2016-07-07 (×2): qty 200

## 2016-07-07 MED ORDER — RIFAMPIN 600 MG IV SOLR
300.0000 mg | Freq: Two times a day (BID) | INTRAVENOUS | Status: DC
  Administered 2016-07-07 – 2016-07-08 (×2): 300 mg via INTRAVENOUS
  Filled 2016-07-07 (×2): qty 300

## 2016-07-07 NOTE — Progress Notes (Signed)
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 He has been on keflex for the wound. He presents with confusion, fever, increased drainage and incontinence. He has been having frequent falls.   Subjective: No significant knee pain. No new complaints.   Assessment & Plan:   Principal Problem:   Sepsis - confusion, knee wound and blood cultures growing coag neg staph- repeat cultures on 7/12 also postiive 1/ 2 sets - has bioprosthetic AVR- ECHO > no vegetations noted- recommended TEE which was done and does not show endocarditis - ID managing antibiotics - I and D done of knee per plastic surgery  - cultures sent - third set of blood cultures negative-  PICC ordered    Active Problems:  Right knee dislocation- prosthetic knee - s/p closed reduction x 2  - superficial wound infection- ortho and plastic surgery eval- see above   Diabetes mellitus without complication  - sugars elevated despite poor PO intake- recently received epidural steroid about 1 month ago - resumed Actos and Januvia - increased SSI- added Lantus - he is drinking Ensure TID between meals which he prefers over Glucerna and this is exacerbating his sugars further -will be giving 16 U Novolog now at dinner - A1c 7.6 in 3/17- no need to recheck at this admission-  - diabetic diet  Obstructive sleep apnea CPAP    CAD (coronary artery disease) of artery bypass graft - Aspirin  Anemia - h/o Iron deficiency anemia followed by Dr Julien Nordmann - cont oral iron- given IV Iron on 7/16    S/P AVR    Chronic systolic CHF (congestive heart failure) - stopped IVF  - following I and O    Parkinson's disease - Sinemet  BPH/ bladder  issues - on Proscar, Flomax and Myrbetriq   DVT prophylaxis: Lovenox Code Status: full code Family Communication:  Disposition Plan: SNF vs LTAC in 2-3 days Consultants:   ID, ortho, plastic surgery Procedures:  2 D ECHO 07/01/16 Study Conclusions  - Left ventricle: The cavity size was normal. Wall thickness was  increased in a pattern of moderate LVH. Systolic function was  normal. The estimated ejection fraction was in the range of 50%  to 55%. Although no diagnostic regional wall motion abnormality  was identified, this possibility cannot be completely excluded on  the basis of this study. - Aortic valve: A bioprosthesis was present and functioning  normally. - Aortic root: The aortic root was mildly dilated. - Mitral valve: There was mild regurgitation. - Left atrium: The atrium was moderately dilated. - Right ventricle: The cavity size was dilated. Wall thickness was  normal. Systolic function was mildly reduced. - Right atrium: The atrium was moderately dilated. - Tricuspid valve: There was mild-moderate regurgitation. - Pulmonary arteries: Systolic pressure was moderately increased.  PA peak pressure: 53 mm Hg (S). Antimicrobials:  Anti-infectives    Start     Dose/Rate Route Frequency Ordered Stop   07/07/16 0900  vancomycin (VANCOCIN) IVPB 1000 mg/200 mL premix     1,000 mg 200 mL/hr over 60 Minutes Intravenous Every 36 hours 07/07/16 0821     07/05/16 0813  polymyxin B 500,000 Units, bacitracin 50,000 Units in sodium chloride irrigation 0.9 %  500 mL irrigation  Status:  Discontinued       As needed 07/05/16 0822 07/05/16 0824   07/05/16 0800  vancomycin (VANCOCIN) IVPB 1000 mg/200 mL premix  Status:  Discontinued     1,000 mg 200 mL/hr over 60 Minutes Intravenous Every 48 hours 07/05/16 0733 07/07/16 0821   07/04/16 1600  fluconazole (DIFLUCAN) IVPB 400 mg  Status:  Discontinued     400 mg 100 mL/hr over 120 Minutes Intravenous Every 24 hours 07/03/16  1606 07/04/16 1157   07/04/16 1600  fluconazole (DIFLUCAN) IVPB 200 mg     200 mg 100 mL/hr over 60 Minutes Intravenous Every 24 hours 07/04/16 1157     07/03/16 1700  fluconazole (DIFLUCAN) IVPB 800 mg     800 mg 100 mL/hr over 240 Minutes Intravenous  Once 07/03/16 1606 07/03/16 2120   06/29/16 2200  vancomycin (VANCOCIN) IVPB 1000 mg/200 mL premix  Status:  Discontinued     1,000 mg 200 mL/hr over 60 Minutes Intravenous Every 12 hours 06/29/16 0939 07/03/16 1218   06/29/16 1600  piperacillin-tazobactam (ZOSYN) IVPB 3.375 g  Status:  Discontinued     3.375 g 12.5 mL/hr over 240 Minutes Intravenous Every 8 hours 06/29/16 0940 07/01/16 1335   06/29/16 0915  piperacillin-tazobactam (ZOSYN) IVPB 3.375 g     3.375 g 100 mL/hr over 30 Minutes Intravenous  Once 06/29/16 0911 06/29/16 0950   06/29/16 0915  vancomycin (VANCOCIN) IVPB 1000 mg/200 mL premix     1,000 mg 200 mL/hr over 60 Minutes Intravenous  Once 06/29/16 0912 06/29/16 1124       Objective: Filed Vitals:   07/06/16 1641 07/06/16 2133 07/07/16 0515 07/07/16 1500  BP: 129/54 127/54 126/46 134/55  Pulse: 56 64 61 61  Temp: 97.9 F (36.6 C) 97.9 F (36.6 C) 98.8 F (37.1 C) 98.7 F (37.1 C)  TempSrc: Oral Axillary Oral Oral  Resp: 16 15 15 18   Height:      Weight:      SpO2: 97% 98% 94% 95%    Intake/Output Summary (Last 24 hours) at 07/07/16 1729 Last data filed at 07/07/16 1500  Gross per 24 hour  Intake    240 ml  Output   1300 ml  Net  -1060 ml   Filed Weights   06/29/16 0825 06/29/16 1227  Weight: 81.647 kg (180 lb) 78.9 kg (173 lb 15.1 oz)    Examination: General exam: Appears comfortable, awake and alert.  HEENT: PERRLA, oral mucosa moist, no sclera icterus or thrush Respiratory system: Clear to auscultation. Respiratory effort normal. Cardiovascular system: S1 & S2 heard, RRR.  No murmurs  Gastrointestinal system: Abdomen soft, non-tender, nondistended. Normal bowel sound. No organomegaly Central  nervous system: Alert and oriented. No focal neurological deficits. Extremities: No cyanosis, clubbing or edema- right knee in dressing Skin: knee in dression Psychiatry:  Mood & affect appropriate.   Data Reviewed: I have personally reviewed following labs and imaging studies  CBC:  Recent Labs Lab 07/02/16 0440 07/03/16 0423 07/04/16 0839 07/07/16 0645  WBC 9.3 10.0 9.4 12.6*  HGB 8.9* 9.7* 9.1* 8.8*  HCT 26.5* 28.9* 27.2* 27.0*  MCV 94.6 94.4 95.8 96.4  PLT 178 263 298 0000000   Basic Metabolic Panel:  Recent Labs Lab 07/01/16 0445 07/02/16 0440 07/03/16 0423 07/04/16 0839 07/06/16 0431 07/07/16 0645  NA 134* 133* 135 133*  --  136  K 4.4 4.5 4.4 4.5  --  4.6  CL 105 103 101 99*  --  102  CO2 23 24 27 27   --  27  GLUCOSE 245* 276* 240* 226*  --  146*  BUN 21* 31* 32* 28*  --  29*  CREATININE 0.72 0.78 0.86 0.82 0.89 0.85  CALCIUM 8.2* 8.3* 8.8* 8.4*  --  8.2*   GFR: Estimated Creatinine Clearance: 56.2 mL/min (by C-G formula based on Cr of 0.85). Liver Function Tests: No results for input(s): AST, ALT, ALKPHOS, BILITOT, PROT, ALBUMIN in the last 168 hours. No results for input(s): LIPASE, AMYLASE in the last 168 hours. No results for input(s): AMMONIA in the last 168 hours. Coagulation Profile: No results for input(s): INR, PROTIME in the last 168 hours. Cardiac Enzymes: No results for input(s): CKTOTAL, CKMB, CKMBINDEX, TROPONINI in the last 168 hours. BNP (last 3 results) No results for input(s): PROBNP in the last 8760 hours. HbA1C: No results for input(s): HGBA1C in the last 72 hours. CBG:  Recent Labs Lab 07/06/16 1659 07/06/16 2135 07/07/16 0732 07/07/16 1201 07/07/16 1711  GLUCAP 128* 320* 133* 382* 127*   Lipid Profile: No results for input(s): CHOL, HDL, LDLCALC, TRIG, CHOLHDL, LDLDIRECT in the last 72 hours. Thyroid Function Tests: No results for input(s): TSH, T4TOTAL, FREET4, T3FREE, THYROIDAB in the last 72 hours. Anemia Panel: No  results for input(s): VITAMINB12, FOLATE, FERRITIN, TIBC, IRON, RETICCTPCT in the last 72 hours. Urine analysis:    Component Value Date/Time   COLORURINE YELLOW 06/29/2016 0907   APPEARANCEUR CLOUDY* 06/29/2016 0907   LABSPEC 1.026 06/29/2016 0907   PHURINE 5.5 06/29/2016 0907   GLUCOSEU 100* 06/29/2016 0907   HGBUR NEGATIVE 06/29/2016 0907   HGBUR negative 02/25/2010 0743   BILIRUBINUR NEGATIVE 06/29/2016 0907   BILIRUBINUR n 02/25/2016 1237   KETONESUR NEGATIVE 06/29/2016 0907   PROTEINUR 100* 06/29/2016 0907   PROTEINUR 1+ 02/25/2016 1237   UROBILINOGEN 0.2 02/25/2016 1237   UROBILINOGEN 0.2 08/20/2010 1651   NITRITE NEGATIVE 06/29/2016 0907   NITRITE n 02/25/2016 1237   LEUKOCYTESUR NEGATIVE 06/29/2016 0907   Sepsis Labs: @LABRCNTIP (procalcitonin:4,lacticidven:4) ) Recent Results (from the past 240 hour(s))  Culture, blood (Routine X 2)     Status: Abnormal   Collection Time: 06/29/16  8:45 AM  Result Value Ref Range Status   Specimen Description BLOOD RIGHT ANTECUBITAL  Final   Special Requests BOTTLES DRAWN AEROBIC AND ANAEROBIC 5ML  Final   Culture  Setup Time   Final    GRAM POSITIVE COCCI IN CLUSTERS IN BOTH AEROBIC AND ANAEROBIC BOTTLES CRITICAL RESULT CALLED TO, READ BACK BY AND VERIFIED WITH: MLT T.MULLINS [id number] @0710  MLM CRITICAL RESULT CALLED TO, READ BACK BY AND VERIFIED WITH: SEAY,K. RN AT [id number] 06/30/16 MULLINS,T Performed at [facility]    Culture STAPHYLOCOCCUS SPECIES (COAGULASE NEGATIVE) (A)  Final   Report Status 07/02/2016 FINAL  Final   Organism ID, Bacteria STAPHYLOCOCCUS SPECIES (COAGULASE NEGATIVE)  Final      Susceptibility   Staphylococcus species (coagulase negative) - MIC*    CIPROFLOXACIN 2 INTERMEDIATE Intermediate     ERYTHROMYCIN <=0.25 SENSITIVE Sensitive     GENTAMICIN 1 SENSITIVE Sensitive     OXACILLIN >=4 RESISTANT Resistant     TETRACYCLINE <=1 SENSITIVE Sensitive     VANCOMYCIN <=0.5 SENSITIVE Sensitive      TRIMETH/SULFA <=10 SENSITIVE Sensitive     CLINDAMYCIN <=0.25 SENSITIVE Sensitive     RIFAMPIN <=0.5 SENSITIVE Sensitive     Inducible Clindamycin NEGATIVE Sensitive     * STAPHYLOCOCCUS SPECIES (COAGULASE NEGATIVE)  Blood Culture ID Panel (  Reflexed)     Status: None   Collection Time: 06/29/16  8:45 AM  Result Value Ref Range Status   Enterococcus species NOT DETECTED NOT DETECTED Final   Vancomycin resistance NOT DETECTED NOT DETECTED Final   Listeria monocytogenes NOT DETECTED NOT DETECTED Final   Staphylococcus species NOT DETECTED NOT DETECTED Final   Staphylococcus aureus NOT DETECTED NOT DETECTED Final   Methicillin resistance NOT DETECTED NOT DETECTED Final   Streptococcus species NOT DETECTED NOT DETECTED Final   Streptococcus agalactiae NOT DETECTED NOT DETECTED Final   Streptococcus pneumoniae NOT DETECTED NOT DETECTED Final   Streptococcus pyogenes NOT DETECTED NOT DETECTED Final   Acinetobacter baumannii NOT DETECTED NOT DETECTED Final   Enterobacteriaceae species NOT DETECTED NOT DETECTED Final   Enterobacter cloacae complex NOT DETECTED NOT DETECTED Final   Escherichia coli NOT DETECTED NOT DETECTED Final   Klebsiella oxytoca NOT DETECTED NOT DETECTED Final   Klebsiella pneumoniae NOT DETECTED NOT DETECTED Final   Proteus species NOT DETECTED NOT DETECTED Final   Serratia marcescens NOT DETECTED NOT DETECTED Final   Carbapenem resistance NOT DETECTED NOT DETECTED Final   Haemophilus influenzae NOT DETECTED NOT DETECTED Final   Neisseria meningitidis NOT DETECTED NOT DETECTED Final   Pseudomonas aeruginosa NOT DETECTED NOT DETECTED Final   Candida albicans NOT DETECTED NOT DETECTED Final   Candida glabrata NOT DETECTED NOT DETECTED Final   Candida krusei NOT DETECTED NOT DETECTED Final   Candida parapsilosis NOT DETECTED NOT DETECTED Final   Candida tropicalis NOT DETECTED NOT DETECTED Final    Comment: Performed at [facility]  Culture, blood (Routine X 2)      Status: Abnormal   Collection Time: 06/29/16  8:47 AM  Result Value Ref Range Status   Specimen Description BLOOD LEFT HAND  Final   Special Requests BOTTLES DRAWN AEROBIC AND ANAEROBIC 5CC  Final   Culture  Setup Time   Final    GRAM POSITIVE COCCI IN CLUSTERS IN BOTH AEROBIC AND ANAEROBIC BOTTLES CRITICAL VALUE NOTED.  VALUE IS CONSISTENT WITH PREVIOUSLY REPORTED AND CALLED VALUE.    Culture (A)  Final    STAPHYLOCOCCUS SPECIES (COAGULASE NEGATIVE) SUSCEPTIBILITIES PERFORMED ON PREVIOUS CULTURE WITHIN THE LAST 5 DAYS. Performed at [facility]    Report Status 07/03/2016 FINAL  Final  Wound or Superficial Culture     Status: None   Collection Time: 06/29/16  8:47 AM  Result Value Ref Range Status   Specimen Description WOUND RIGHT KNEE  Final   Special Requests Normal  Final   Gram Stain   Final    FEW WBC PRESENT, PREDOMINANTLY PMN FEW GRAM POSITIVE COCCI IN PAIRS Performed at [facility]    Culture   Final    MODERATE STAPHYLOCOCCUS SPECIES (COAGULASE NEGATIVE) MODERATE CANDIDA ALBICANS    Report Status 07/03/2016 FINAL  Final   Organism ID, Bacteria STAPHYLOCOCCUS SPECIES (COAGULASE NEGATIVE)  Final      Susceptibility   Staphylococcus species (coagulase negative) - MIC*    CIPROFLOXACIN 2 INTERMEDIATE Intermediate     ERYTHROMYCIN <=0.25 SENSITIVE Sensitive     GENTAMICIN 1 SENSITIVE Sensitive     OXACILLIN >=4 RESISTANT Resistant     TETRACYCLINE <=1 SENSITIVE Sensitive     VANCOMYCIN <=0.5 SENSITIVE Sensitive     TRIMETH/SULFA <=10 SENSITIVE Sensitive     CLINDAMYCIN <=0.25 SENSITIVE Sensitive     RIFAMPIN <=0.5 SENSITIVE Sensitive     Inducible Clindamycin NEGATIVE Sensitive     * MODERATE  STAPHYLOCOCCUS SPECIES (COAGULASE NEGATIVE)  Urine C&S     Status: Abnormal   Collection Time: 06/29/16  9:07 AM  Result Value Ref Range Status   Specimen Description URINE, CLEAN CATCH  Final   Special Requests Normal  Final   Culture 40,000 COLONIES/mL  ENTEROBACTER AEROGENES (A)  Final   Report Status 07/01/2016 FINAL  Final   Organism ID, Bacteria ENTEROBACTER AEROGENES (A)  Final      Susceptibility   Enterobacter aerogenes - MIC*    CEFAZOLIN >=64 RESISTANT Resistant     CEFTRIAXONE >=64 RESISTANT Resistant     CIPROFLOXACIN <=0.25 SENSITIVE Sensitive     GENTAMICIN <=1 SENSITIVE Sensitive     IMIPENEM <=0.25 SENSITIVE Sensitive     NITROFURANTOIN 64 INTERMEDIATE Intermediate     TRIMETH/SULFA <=20 SENSITIVE Sensitive     PIP/TAZO >=128 RESISTANT Resistant     * 40,000 COLONIES/mL ENTEROBACTER AEROGENES  MRSA PCR Screening     Status: None   Collection Time: 06/29/16  1:27 PM  Result Value Ref Range Status   MRSA by PCR NEGATIVE NEGATIVE Final    Comment:        The GeneXpert MRSA Assay (FDA approved for NASAL specimens only), is one component of a comprehensive MRSA colonization surveillance program. It is not intended to diagnose MRSA infection nor to guide or monitor treatment for MRSA infections.   Culture, blood (Routine X 2) w Reflex to ID Panel     Status: None   Collection Time: 06/30/16 12:37 PM  Result Value Ref Range Status   Specimen Description BLOOD LEFT ARM  Final   Special Requests BOTTLES DRAWN AEROBIC ONLY 5CC  Final   Culture   Final    NO GROWTH 5 DAYS Performed at [facility]    Report Status 07/05/2016 FINAL  Final  Culture, blood (Routine X 2) w Reflex to ID Panel     Status: Abnormal   Collection Time: 06/30/16 12:41 PM  Result Value Ref Range Status   Specimen Description BLOOD LEFT HAND  Final   Special Requests IN PEDIATRIC BOTTLE 2CC  Final   Culture  Setup Time   Final    GRAM POSITIVE COCCI IN CLUSTERS AEROBIC BOTTLE ONLY CRITICAL RESULT CALLED TO, READ BACK BY AND VERIFIED WITH: T. PICKERING RPH AT 1209 07/01/16 BY D. VANHOOK    Culture (A)  Final    STAPHYLOCOCCUS SPECIES (COAGULASE NEGATIVE) SUSCEPTIBILITIES PERFORMED ON PREVIOUS CULTURE WITHIN THE LAST 5 DAYS. Performed  at [facility]    Report Status 07/03/2016 FINAL  Final  Culture, blood (Routine X 2) w Reflex to ID Panel     Status: None   Collection Time: 07/02/16  7:56 AM  Result Value Ref Range Status   Specimen Description BLOOD LEFT ANTECUBITAL  Final   Special Requests BOTTLES DRAWN AEROBIC AND ANAEROBIC 10CC  Final   Culture   Final    NO GROWTH 5 DAYS Performed at [facility]    Report Status 07/07/2016 FINAL  Final  Aerobic/Anaerobic Culture (surgical/deep wound)     Status: None (Preliminary result)   Collection Time: 07/05/16  8:04 AM  Result Value Ref Range Status   Specimen Description SYNOVIAL SWAB OF RT KNEE JOINT  Final   Special Requests NONE  Final   Gram Stain   Final    ABUNDANT WBC PRESENT,BOTH PMN AND MONONUCLEAR MODERATE GRAM POSITIVE COCCI IN CLUSTERS    Culture   Final    MODERATE STAPHYLOCOCCUS  SPECIES (COAGULASE NEGATIVE)   Report Status PENDING  Incomplete  Aerobic/Anaerobic Culture (surgical/deep wound)     Status: None (Preliminary result)   Collection Time: 07/05/16  8:10 AM  Result Value Ref Range Status   Specimen Description BONE EXPOSE TIBIA  Final   Special Requests NONE  Final   Gram Stain   Final    MODERATE WBC PRESENT,BOTH PMN AND MONONUCLEAR NO ORGANISMS SEEN    Culture   Final    RARE STAPHYLOCOCCUS SPECIES (COAGULASE NEGATIVE) NO ANAEROBES ISOLATED; CULTURE IN PROGRESS FOR 5 DAYS    Report Status PENDING  Incomplete         Radiology Studies: No results found.    Scheduled Meds: . aspirin  81 mg Oral Daily  . calcium-vitamin D  1 tablet Oral BID  . carbidopa-levodopa  2 tablet Oral 4 times per day  . diclofenac  75 mg Oral BID AC  . enoxaparin (LOVENOX) injection  40 mg Subcutaneous Q24H  . feeding supplement (ENSURE ENLIVE)  237 mL Oral TID BM  . finasteride  5 mg Oral Daily  . fluconazole (DIFLUCAN) IV  200 mg Intravenous Q24H  . insulin aspart  0-20 Units Subcutaneous TID WC  . insulin aspart  0-5 Units  Subcutaneous QHS  . insulin glargine  25 Units Subcutaneous Daily  . INTEGRA PLUS  1 capsule Oral q morning - 10a  . linagliptin  5 mg Oral Daily  . mirabegron ER  50 mg Oral Daily  . multivitamin with minerals  1 tablet Oral Daily  . pantoprazole  40 mg Oral BID  . pioglitazone  45 mg Oral Daily  . rosuvastatin  10 mg Oral Daily  . tamsulosin  0.4 mg Oral QPC breakfast  . vancomycin  1,000 mg Intravenous Q36H  . vitamin A  10,000 Units Oral Daily  . zinc sulfate  220 mg Oral Daily   Continuous Infusions:     LOS: 8 days   Time spent in minutes: 35  Velvet Bathe, MD [facility] Pager: [email or web address] Password [email or web address] 07/07/2016, 5:29 PM

## 2016-07-07 NOTE — Progress Notes (Signed)
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  Pharmacy has been consulted for Vancomycin dosing for bacteremia and r/o prosthetic valve endocarditis.  Plan:  Change vancomycin to 1000 mg IV q36h  Re-check Vanc trough at steady state.  Follow up renal fxn, culture results, and clinical course.   Height: 5\' 7"  (170.2 cm) Weight: 173 lb 15.1 oz (78.9 kg) IBW/kg (Calculated) : 66.1  Temp (24hrs), Avg:98.2 F (36.8 C), Min:97.9 F (36.6 C), Max:98.8 F (37.1 C)   Recent Labs Lab 07/02/16 0440 07/03/16 0423  07/04/16 0839 07/05/16 0509 07/06/16 0431 07/07/16 0645  WBC 9.3 10.0  --  9.4  --   --  12.6*  CREATININE 0.78 0.86  --  0.82  --  0.89 0.85  VANCOTROUGH  --   --   < >  --  15  --  10*  < > = values in this interval not displayed.  Estimated Creatinine Clearance: 56.2 mL/min (by C-G formula based on Cr of 0.85).    Allergies  Allergen Reactions  . Ace Inhibitors Other (See Comments)    cough  . Succinylcholine Other (See Comments)    Prolonged sedation  . Morphine And Related Nausea And Vomiting    Antimicrobials this admission: 7/11 Vancomycin >>  7/11 Zosyn >> 7/12 7/15 Fluconazole >>  Dose adjustments this admission: 7/15 0900 Vanc trough = 32 mcg/ml on Vancomycin 1g IV q12h. Note that trough drawn at 0901, results still pending at 11:45 - call placed to lab, sample not yet in process, but will be completed shortly. Trough elevated, unfortunately the 10am Vancomycin 1gm dose given 7/16: Rand Vanc level = 25 mcg/ml 7/17 Rand Vanc level = 15 (last dose ~ 42 hours prior)  7/19 0700 Vanc level = 10   Microbiology results: 7/11 BCx: 2/2 gram stain GPC/clusters, but BCID neg  resulted CoNS - Oxacillin  Res 7/11 UCx: 40K enterobacter aerogenes (S to cipro, gent, imi, bactrim) (Colonization per ID) 7/11 Wound cx R knee: MR-CoNS and C. albicans 7/11 MRSA: neg (has been pos in past) 7/12 BCx: GPC clusters 1/2 (BCID wont be run on this since same morphology as previous blood cx) 7/14 rpt BCx: NGTD (as long as this remains neg, OK for PICC) 7/17 Tibia Bone: NGTD 7/17 Synovial Rt Knee joint: moderate gram positive cocci in clusters,reincubated   Thank you for allowing pharmacy to be a part of this patient's care.  Royetta Asal, PharmD, BCPS Pager [phone] 07/07/2016 10:43 AM

## 2016-07-07 NOTE — Progress Notes (Signed)
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 with  Principal Problem:   Sepsis ([place]) Active Problems:   Hypothyroidism   Diabetes mellitus without complication ([place])   HTN (hypertension)   Iron deficiency anemia   Obstructive sleep apnea   CAD (coronary artery disease) of artery bypass graft   S/P AVR   Chronic systolic CHF (congestive heart failure) (HCC)   Parkinson's disease (HCC)   Pressure ulcer   Pyrexia   Coag negative Staphylococcus bacteremia   Leg wound, right   Prosthetic valve endocarditis (HCC)   Coagulase negative Staphylococcus bacteremia   Septic shock (HCC)   Candida infection  Subjective: Without complaints  Abtx:  Anti-infectives    Start     Dose/Rate Route Frequency Ordered Stop   07/07/16 0900  vancomycin (VANCOCIN) IVPB 1000 mg/200 mL premix     1,000 mg 200 mL/hr over 60 Minutes Intravenous Every 36 hours 07/07/16 0821     07/05/16 0813  polymyxin B 500,000 Units, bacitracin 50,000 Units in sodium chloride irrigation 0.9 % 500 mL irrigation  Status:  Discontinued       As needed 07/05/16 0822 07/05/16 0824   07/05/16 0800  vancomycin (VANCOCIN) IVPB 1000 mg/200 mL premix  Status:  Discontinued     1,000 mg 200 mL/hr over 60 Minutes Intravenous Every 48 hours 07/05/16 0733 07/07/16 0821   07/04/16 1600  fluconazole (DIFLUCAN) IVPB 400 mg  Status:  Discontinued     400 mg 100 mL/hr over 120 Minutes Intravenous Every 24 hours 07/03/16 1606 07/04/16 1157   07/04/16 1600  fluconazole (DIFLUCAN) IVPB 200 mg     200 mg 100 mL/hr over 60 Minutes Intravenous Every 24 hours 07/04/16 1157     07/03/16 1700  fluconazole (DIFLUCAN) IVPB 800 mg     800 mg 100 mL/hr over 240 Minutes Intravenous  Once 07/03/16 1606 07/03/16 2120   06/29/16 2200  vancomycin (VANCOCIN) IVPB 1000 mg/200 mL premix  Status:  Discontinued     1,000 mg 200 mL/hr over 60 Minutes Intravenous Every 12 hours 06/29/16 0939 07/03/16 1218   06/29/16 1600   piperacillin-tazobactam (ZOSYN) IVPB 3.375 g  Status:  Discontinued     3.375 g 12.5 mL/hr over 240 Minutes Intravenous Every 8 hours 06/29/16 0940 07/01/16 1335   06/29/16 0915  piperacillin-tazobactam (ZOSYN) IVPB 3.375 g     3.375 g 100 mL/hr over 30 Minutes Intravenous  Once 06/29/16 0911 06/29/16 0950   06/29/16 0915  vancomycin (VANCOCIN) IVPB 1000 mg/200 mL premix     1,000 mg 200 mL/hr over 60 Minutes Intravenous  Once 06/29/16 0912 06/29/16 1124      Medications:  Scheduled: . aspirin  81 mg Oral Daily  . calcium-vitamin D  1 tablet Oral BID  . carbidopa-levodopa  2 tablet Oral 4 times per day  . diclofenac  75 mg Oral BID AC  . enoxaparin (LOVENOX) injection  40 mg Subcutaneous Q24H  . feeding supplement (ENSURE ENLIVE)  237 mL Oral TID BM  . finasteride  5 mg Oral Daily  . fluconazole (DIFLUCAN) IV  200 mg Intravenous Q24H  . insulin aspart  0-20 Units Subcutaneous TID WC  . insulin aspart  0-5 Units Subcutaneous QHS  . insulin glargine  25 Units Subcutaneous Daily  . INTEGRA PLUS  1 capsule Oral q morning - 10a  . linagliptin  5 mg Oral Daily  . mirabegron ER  50 mg Oral Daily  .  multivitamin with minerals  1 tablet Oral Daily  . pantoprazole  40 mg Oral BID  . pioglitazone  45 mg Oral Daily  . rosuvastatin  10 mg Oral Daily  . tamsulosin  0.4 mg Oral QPC breakfast  . vancomycin  1,000 mg Intravenous Q36H  . vitamin A  10,000 Units Oral Daily  . zinc sulfate  220 mg Oral Daily    Objective: Vital signs in last 24 hours: Temp:  [97.9 F (36.6 C)-98.8 F (37.1 C)] 98.7 F (37.1 C) (07/19 1500) Pulse Rate:  [61-64] 61 (07/19 1500) Resp:  [15-18] 18 (07/19 1500) BP: (126-134)/(46-55) 134/55 mmHg (07/19 1500) SpO2:  [94 %-98 %] 95 % (07/19 1500)   General appearance: alert, cooperative and no distress Resp: clear to auscultation bilaterally Cardio: regular rate and rhythm GI: normal findings: bowel sounds normal and soft, non-tender Extremities: RUE PIC  is clean. RLE dressed, vac in.   Lab Results  Recent Labs  07/06/16 0431 07/07/16 0645  WBC  --  12.6*  HGB  --  8.8*  HCT  --  27.0*  NA  --  136  K  --  4.6  CL  --  102  CO2  --  27  BUN  --  29*  CREATININE 0.89 0.85   Liver Panel No results for input(s): PROT, ALBUMIN, AST, ALT, ALKPHOS, BILITOT, BILIDIR, IBILI in the last 72 hours. Sedimentation Rate No results for input(s): ESRSEDRATE in the last 72 hours. C-Reactive Protein No results for input(s): CRP in the last 72 hours.  Microbiology: Recent Results (from the past 240 hour(s))  Culture, blood (Routine X 2)     Status: Abnormal   Collection Time: 06/29/16  8:45 AM  Result Value Ref Range Status   Specimen Description BLOOD RIGHT ANTECUBITAL  Final   Special Requests BOTTLES DRAWN AEROBIC AND ANAEROBIC 5ML  Final   Culture  Setup Time   Final    GRAM POSITIVE COCCI IN CLUSTERS IN BOTH AEROBIC AND ANAEROBIC BOTTLES CRITICAL RESULT CALLED TO, READ BACK BY AND VERIFIED WITH: MLT T.MULLINS [id number] @0710  MLM CRITICAL RESULT CALLED TO, READ BACK BY AND VERIFIED WITH: SEAY,K. RN AT QN:[id number] 06/30/16 MULLINS,T Performed at [facility]    Culture STAPHYLOCOCCUS SPECIES (COAGULASE NEGATIVE) (A)  Final   Report Status 07/02/2016 FINAL  Final   Organism ID, Bacteria STAPHYLOCOCCUS SPECIES (COAGULASE NEGATIVE)  Final      Susceptibility   Staphylococcus species (coagulase negative) - MIC*    CIPROFLOXACIN 2 INTERMEDIATE Intermediate     ERYTHROMYCIN <=0.25 SENSITIVE Sensitive     GENTAMICIN 1 SENSITIVE Sensitive     OXACILLIN >=4 RESISTANT Resistant     TETRACYCLINE <=1 SENSITIVE Sensitive     VANCOMYCIN <=0.5 SENSITIVE Sensitive     TRIMETH/SULFA <=10 SENSITIVE Sensitive     CLINDAMYCIN <=0.25 SENSITIVE Sensitive     RIFAMPIN <=0.5 SENSITIVE Sensitive     Inducible Clindamycin NEGATIVE Sensitive     * STAPHYLOCOCCUS SPECIES (COAGULASE NEGATIVE)  Blood Culture ID Panel (Reflexed)     Status: None   Collection  Time: 06/29/16  8:45 AM  Result Value Ref Range Status   Enterococcus species NOT DETECTED NOT DETECTED Final   Vancomycin resistance NOT DETECTED NOT DETECTED Final   Listeria monocytogenes NOT DETECTED NOT DETECTED Final   Staphylococcus species NOT DETECTED NOT DETECTED Final   Staphylococcus aureus NOT DETECTED NOT DETECTED Final   Methicillin resistance NOT DETECTED NOT DETECTED Final   Streptococcus species NOT  DETECTED NOT DETECTED Final   Streptococcus agalactiae NOT DETECTED NOT DETECTED Final   Streptococcus pneumoniae NOT DETECTED NOT DETECTED Final   Streptococcus pyogenes NOT DETECTED NOT DETECTED Final   Acinetobacter baumannii NOT DETECTED NOT DETECTED Final   Enterobacteriaceae species NOT DETECTED NOT DETECTED Final   Enterobacter cloacae complex NOT DETECTED NOT DETECTED Final   Escherichia coli NOT DETECTED NOT DETECTED Final   Klebsiella oxytoca NOT DETECTED NOT DETECTED Final   Klebsiella pneumoniae NOT DETECTED NOT DETECTED Final   Proteus species NOT DETECTED NOT DETECTED Final   Serratia marcescens NOT DETECTED NOT DETECTED Final   Carbapenem resistance NOT DETECTED NOT DETECTED Final   Haemophilus influenzae NOT DETECTED NOT DETECTED Final   Neisseria meningitidis NOT DETECTED NOT DETECTED Final   Pseudomonas aeruginosa NOT DETECTED NOT DETECTED Final   Candida albicans NOT DETECTED NOT DETECTED Final   Candida glabrata NOT DETECTED NOT DETECTED Final   Candida krusei NOT DETECTED NOT DETECTED Final   Candida parapsilosis NOT DETECTED NOT DETECTED Final   Candida tropicalis NOT DETECTED NOT DETECTED Final    Comment: Performed at [facility]  Culture, blood (Routine X 2)     Status: Abnormal   Collection Time: 06/29/16  8:47 AM  Result Value Ref Range Status   Specimen Description BLOOD LEFT HAND  Final   Special Requests BOTTLES DRAWN AEROBIC AND ANAEROBIC 5CC  Final   Culture  Setup Time   Final    GRAM POSITIVE COCCI IN CLUSTERS IN BOTH AEROBIC  AND ANAEROBIC BOTTLES CRITICAL VALUE NOTED.  VALUE IS CONSISTENT WITH PREVIOUSLY REPORTED AND CALLED VALUE.    Culture (A)  Final    STAPHYLOCOCCUS SPECIES (COAGULASE NEGATIVE) SUSCEPTIBILITIES PERFORMED ON PREVIOUS CULTURE WITHIN THE LAST 5 DAYS. Performed at [facility]    Report Status 07/03/2016 FINAL  Final  Wound or Superficial Culture     Status: None   Collection Time: 06/29/16  8:47 AM  Result Value Ref Range Status   Specimen Description WOUND RIGHT KNEE  Final   Special Requests Normal  Final   Gram Stain   Final    FEW WBC PRESENT, PREDOMINANTLY PMN FEW GRAM POSITIVE COCCI IN PAIRS Performed at [facility]    Culture   Final    MODERATE STAPHYLOCOCCUS SPECIES (COAGULASE NEGATIVE) MODERATE CANDIDA ALBICANS    Report Status 07/03/2016 FINAL  Final   Organism ID, Bacteria STAPHYLOCOCCUS SPECIES (COAGULASE NEGATIVE)  Final      Susceptibility   Staphylococcus species (coagulase negative) - MIC*    CIPROFLOXACIN 2 INTERMEDIATE Intermediate     ERYTHROMYCIN <=0.25 SENSITIVE Sensitive     GENTAMICIN 1 SENSITIVE Sensitive     OXACILLIN >=4 RESISTANT Resistant     TETRACYCLINE <=1 SENSITIVE Sensitive     VANCOMYCIN <=0.5 SENSITIVE Sensitive     TRIMETH/SULFA <=10 SENSITIVE Sensitive     CLINDAMYCIN <=0.25 SENSITIVE Sensitive     RIFAMPIN <=0.5 SENSITIVE Sensitive     Inducible Clindamycin NEGATIVE Sensitive     * MODERATE STAPHYLOCOCCUS SPECIES (COAGULASE NEGATIVE)  Urine C&S     Status: Abnormal   Collection Time: 06/29/16  9:07 AM  Result Value Ref Range Status   Specimen Description URINE, CLEAN CATCH  Final   Special Requests Normal  Final   Culture 40,000 COLONIES/mL ENTEROBACTER AEROGENES (A)  Final   Report Status 07/01/2016 FINAL  Final   Organism ID, Bacteria ENTEROBACTER AEROGENES (A)  Final      Susceptibility   Enterobacter  aerogenes - MIC*    CEFAZOLIN >=64 RESISTANT Resistant     CEFTRIAXONE >=64 RESISTANT Resistant     CIPROFLOXACIN  <=0.25 SENSITIVE Sensitive     GENTAMICIN <=1 SENSITIVE Sensitive     IMIPENEM <=0.25 SENSITIVE Sensitive     NITROFURANTOIN 64 INTERMEDIATE Intermediate     TRIMETH/SULFA <=20 SENSITIVE Sensitive     PIP/TAZO >=128 RESISTANT Resistant     * 40,000 COLONIES/mL ENTEROBACTER AEROGENES  MRSA PCR Screening     Status: None   Collection Time: 06/29/16  1:27 PM  Result Value Ref Range Status   MRSA by PCR NEGATIVE NEGATIVE Final    Comment:        The GeneXpert MRSA Assay (FDA approved for NASAL specimens only), is one component of a comprehensive MRSA colonization surveillance program. It is not intended to diagnose MRSA infection nor to guide or monitor treatment for MRSA infections.   Culture, blood (Routine X 2) w Reflex to ID Panel     Status: None   Collection Time: 06/30/16 12:37 PM  Result Value Ref Range Status   Specimen Description BLOOD LEFT ARM  Final   Special Requests BOTTLES DRAWN AEROBIC ONLY 5CC  Final   Culture   Final    NO GROWTH 5 DAYS Performed at [facility]    Report Status 07/05/2016 FINAL  Final  Culture, blood (Routine X 2) w Reflex to ID Panel     Status: Abnormal   Collection Time: 06/30/16 12:41 PM  Result Value Ref Range Status   Specimen Description BLOOD LEFT HAND  Final   Special Requests IN PEDIATRIC BOTTLE 2CC  Final   Culture  Setup Time   Final    GRAM POSITIVE COCCI IN CLUSTERS AEROBIC BOTTLE ONLY CRITICAL RESULT CALLED TO, READ BACK BY AND VERIFIED WITH: T. PICKERING RPH AT 1209 07/01/16 BY D. VANHOOK    Culture (A)  Final    STAPHYLOCOCCUS SPECIES (COAGULASE NEGATIVE) SUSCEPTIBILITIES PERFORMED ON PREVIOUS CULTURE WITHIN THE LAST 5 DAYS. Performed at [facility]    Report Status 07/03/2016 FINAL  Final  Culture, blood (Routine X 2) w Reflex to ID Panel     Status: None   Collection Time: 07/02/16  7:56 AM  Result Value Ref Range Status   Specimen Description BLOOD LEFT ANTECUBITAL  Final   Special Requests BOTTLES  DRAWN AEROBIC AND ANAEROBIC 10CC  Final   Culture   Final    NO GROWTH 5 DAYS Performed at [facility]    Report Status 07/07/2016 FINAL  Final  Aerobic/Anaerobic Culture (surgical/deep wound)     Status: None (Preliminary result)   Collection Time: 07/05/16  8:04 AM  Result Value Ref Range Status   Specimen Description SYNOVIAL SWAB OF RT KNEE JOINT  Final   Special Requests NONE  Final   Gram Stain   Final    ABUNDANT WBC PRESENT,BOTH PMN AND MONONUCLEAR MODERATE GRAM POSITIVE COCCI IN CLUSTERS    Culture   Final    MODERATE STAPHYLOCOCCUS SPECIES (COAGULASE NEGATIVE)   Report Status PENDING  Incomplete  Aerobic/Anaerobic Culture (surgical/deep wound)     Status: None (Preliminary result)   Collection Time: 07/05/16  8:10 AM  Result Value Ref Range Status   Specimen Description BONE EXPOSE TIBIA  Final   Special Requests NONE  Final   Gram Stain   Final    MODERATE WBC PRESENT,BOTH PMN AND MONONUCLEAR NO ORGANISMS SEEN    Culture   Final  RARE STAPHYLOCOCCUS SPECIES (COAGULASE NEGATIVE) NO ANAEROBES ISOLATED; CULTURE IN PROGRESS FOR 5 DAYS    Report Status PENDING  Incomplete    Studies/Results: No results found.   Assessment/Plan: Septic arthritis Debrided 7-18 Wound Cx has grown MRSA and Candida albicans VAC in place Presence of yeast seems a poor prognostic for his knee  Staph/MRSE bacteremia (7-11 and 7-12)  Repeat BCx 7-14 ngtd Repeat BCx 7-17 2/2 CNS I am very concerned about his persistently positive BCx TEE (-) He may need further eval of his knee.  Will add rifampin Pull PIC  Prosthetic Ao valve TEE (-)  DM2 uncontrolled FSG remain elevated.   Total days of antibiotics: 8 vanco, 5 flunconazole         Bobby Rumpf Infectious Diseases (pager) [phone] [email or web address] 07/07/2016, 5:44 PM  LOS: 8 days

## 2016-07-07 NOTE — Progress Notes (Signed)
Notified by primary RN to d/c right SL PICC,placed 07-06-16. Patient has poor veins,very fragile skin with multiple skin tears. Unable to find suitable peripheral vein. Notified [employer] fast streamer. She will notify MD for further orders.

## 2016-07-08 DIAGNOSIS — Z794 Long term (current) use of insulin: Secondary | ICD-10-CM

## 2016-07-08 DIAGNOSIS — E118 Type 2 diabetes mellitus with unspecified complications: Secondary | ICD-10-CM

## 2016-07-08 DIAGNOSIS — R509 Fever, unspecified: Secondary | ICD-10-CM

## 2016-07-08 LAB — GLUCOSE, CAPILLARY
GLUCOSE-CAPILLARY: 127 mg/dL — AB (ref 65–99)
GLUCOSE-CAPILLARY: 331 mg/dL — AB (ref 65–99)
GLUCOSE-CAPILLARY: 313 mg/dL — AB (ref 65–99)
Glucose-Capillary: 217 mg/dL — ABNORMAL HIGH (ref 65–99)

## 2016-07-08 MED ORDER — RIFAMPIN 300 MG PO CAPS
300.0000 mg | ORAL_CAPSULE | Freq: Every day | ORAL | Status: DC
  Administered 2016-07-09: 300 mg via ORAL
  Filled 2016-07-08: qty 1

## 2016-07-08 NOTE — Progress Notes (Signed)
Physical Therapy Treatment Patient Details Name: CULLEY PLANO MD MRN: [id number] DOB: [birth year] Today's Date: 07/08/2016    History of Present Illness 82 y.o. male with medical history significant of DM, PVD, CAD, S/P CABG, aortic stenosis, S/P AVR,chronic systolic CHF and Parkinson's disease who was admitted with confusion, fever, draining R knee wound, falls at home; sepsis .  Pt with hx of R TKA, R TKR requiring closed manipulation on 05/29/16 and then closed reduction of the patella on 06/09/16. Pt s/p R knee I & D with wound vac placement 07/05/16.    PT Comments    The patient reports not feeling good about not being able to ambulate today. He reports having a brace that is more secure and that he needs to wear his shoes due to  LLD. Encouraged  him to have them brought in. Continue PT while in acute care.  Follow Up Recommendations  Supervision/Assistance - 24 hour;SNF     Equipment Recommendations  None recommended by PT    Recommendations for Other Services       Precautions / Restrictions Precautions Precautions: Fall Required Braces or Orthoses: Knee Immobilizer - Right Knee Immobilizer - Right: On when out of bed or walking Other Brace/Splint: knee brace due to lack of extensor mechanism, also has KI in room    Mobility  Bed Mobility   Bed Mobility: Supine to Sit     Supine to sit: Mod assist     General bed mobility comments: A for getting trunk upright.  Can scoot to EOB, but needs cueing and increased time  Transfers Overall transfer level: Needs assistance   Transfers: Sit to/from Stand Sit to Stand: Mod assist;+2 safety/equipment;From elevated surface Stand pivot transfers: +2 physical assistance;Mod assist;+2 safety/equipment       General transfer comment: cues for hand placement, dexcreased weight  tolerated on the right leg so only made a few steps to the recliner.  Ambulation/Gait                 Stairs            Wheelchair  Mobility    Modified Rankin (Stroke Patients Only)       Balance                                    Cognition Arousal/Alertness: Awake/alert                          Exercises      General Comments        Pertinent Vitals/Pain Faces Pain Scale: Hurts little more Pain Location: R knee Pain Descriptors / Indicators: Discomfort Pain Intervention(s): Limited activity within patient's tolerance;Patient requesting pain meds-RN notified    Home Living                      Prior Function            PT Goals (current goals can now be found in the care plan section) Progress towards PT goals: Progressing toward goals    Frequency  Min 3X/week    PT Plan Current plan remains appropriate    Co-evaluation             End of Session Equipment Utilized During Treatment: Gait belt Activity Tolerance: Patient limited by pain;Patient limited by fatigue Patient left: in chair;with call bell/phone  within reach;with chair alarm set     Time: [phone] PT Time Calculation (min) (ACUTE ONLY): 18 min  Charges:  $Gait Training: 8-22 mins                    G Codes:      Claretha Cooper 07/08/2016, 1:32 PM Tresa Endo PT [phone]

## 2016-07-08 NOTE — Progress Notes (Signed)
2 Days Post-Op  Subjective: The patient is up in chair with family visiting.  Overall is feeling better.  No complaints.  VAC in place with good seal.  Objective: Vital signs in last 24 hours: Temp:  [97.8 F (36.6 C)-98.7 F (37.1 C)] 97.8 F (36.6 C) (07/20 0558) Pulse Rate:  [54-61] 54 (07/20 0558) Resp:  [18-20] 18 (07/20 0558) BP: (134-151)/(47-63) 151/63 mmHg (07/20 0558) SpO2:  [95 %-100 %] 100 % (07/20 0558) Last BM Date: 07/05/16  Intake/Output from previous day: 07/19 0701 - 07/20 0700 In: 700 [P.O.:600; IV Piggyback:100] Out: 1775 [Urine:1775] Intake/Output this shift:    General appearance: alert, cooperative and no distress Incision/Wound:  Lab Results:   Recent Labs  07/07/16 0645  WBC 12.6*  HGB 8.8*  HCT 27.0*  PLT 385   BMET  Recent Labs  07/06/16 0431 07/07/16 0645  NA  --  136  K  --  4.6  CL  --  102  CO2  --  27  GLUCOSE  --  146*  BUN  --  29*  CREATININE 0.89 0.85  CALCIUM  --  8.2*   PT/INR No results for input(s): LABPROT, INR in the last 72 hours. ABG No results for input(s): PHART, HCO3 in the last 72 hours.  Invalid input(s): PCO2, PO2  Studies/Results: No results found.  Anti-infectives: Anti-infectives    Start     Dose/Rate Route Frequency Ordered Stop   07/09/16 1000  rifampin (RIFADIN) capsule 300 mg     300 mg Oral Daily 07/08/16 1136     07/07/16 2200  rifampin (RIFADIN) 300 mg in sodium chloride 0.9 % 100 mL IVPB  Status:  Discontinued     300 mg 200 mL/hr over 30 Minutes Intravenous Every 12 hours 07/07/16 1757 07/08/16 1136   07/07/16 0900  vancomycin (VANCOCIN) IVPB 1000 mg/200 mL premix     1,000 mg 200 mL/hr over 60 Minutes Intravenous Every 36 hours 07/07/16 0821     07/05/16 0813  polymyxin B 500,000 Units, bacitracin 50,000 Units in sodium chloride irrigation 0.9 % 500 mL irrigation  Status:  Discontinued       As needed 07/05/16 0822 07/05/16 0824   07/05/16 0800  vancomycin (VANCOCIN) IVPB 1000  mg/200 mL premix  Status:  Discontinued     1,000 mg 200 mL/hr over 60 Minutes Intravenous Every 48 hours 07/05/16 0733 07/07/16 0821   07/04/16 1600  fluconazole (DIFLUCAN) IVPB 400 mg  Status:  Discontinued     400 mg 100 mL/hr over 120 Minutes Intravenous Every 24 hours 07/03/16 1606 07/04/16 1157   07/04/16 1600  fluconazole (DIFLUCAN) IVPB 200 mg     200 mg 100 mL/hr over 60 Minutes Intravenous Every 24 hours 07/04/16 1157     07/03/16 1700  fluconazole (DIFLUCAN) IVPB 800 mg     800 mg 100 mL/hr over 240 Minutes Intravenous  Once 07/03/16 1606 07/03/16 2120   06/29/16 2200  vancomycin (VANCOCIN) IVPB 1000 mg/200 mL premix  Status:  Discontinued     1,000 mg 200 mL/hr over 60 Minutes Intravenous Every 12 hours 06/29/16 0939 07/03/16 1218   06/29/16 1600  piperacillin-tazobactam (ZOSYN) IVPB 3.375 g  Status:  Discontinued     3.375 g 12.5 mL/hr over 240 Minutes Intravenous Every 8 hours 06/29/16 0940 07/01/16 1335   06/29/16 0915  piperacillin-tazobactam (ZOSYN) IVPB 3.375 g     3.375 g 100 mL/hr over 30 Minutes Intravenous  Once 06/29/16 0911 06/29/16 0950  06/29/16 0915  vancomycin (VANCOCIN) IVPB 1000 mg/200 mL premix     1,000 mg 200 mL/hr over 60 Minutes Intravenous  Once 06/29/16 0912 06/29/16 1124      Assessment/Plan: s/p Procedure(s): TRANSESOPHAGEAL ECHOCARDIOGRAM (TEE) (N/A) Will change the [facility] tomorrow.  Reviewed cultures and spoke with Dr. Charlestine Night PA earlier today.  LOS: 9 days    Wallace Going 07/08/2016

## 2016-07-08 NOTE — Progress Notes (Signed)
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 He has been on keflex for the wound. He presents with confusion, fever, increased drainage and incontinence. He has been having frequent falls.   Subjective: No significant knee pain. No new complaints.   Assessment & Plan:   Principal Problem:   Sepsis - confusion, knee wound and blood cultures growing coag neg staph- repeat cultures on 7/12 also postiive 1/ 2 sets - has bioprosthetic AVR- ECHO > no vegetations noted- recommended TEE which was done and does not show endocarditis - ID managing antibiotics - I and D done of knee per plastic surgery  - cultures sent again today - third set of blood cultures negative-  PICC ordered   - Will reassess WBC levels. Most likely DC next a.m. on new oral rifampin dose  Active Problems:  Right knee dislocation- prosthetic knee - s/p closed reduction x 2  - superficial wound infection- ortho and plastic surgery eval- see above   Diabetes mellitus without complication  - sugars elevated despite poor PO intake- recently received epidural steroid about 1 month ago - resumed Actos and Januvia - increased SSI- added Lantus - he is drinking Ensure TID between meals which he prefers over Glucerna and this is exacerbating his sugars further -will be giving 16 U Novolog now at dinner - A1c 7.6 in 3/17- no need to recheck at this admission-  - diabetic diet  Obstructive sleep apnea CPAP    CAD (coronary artery disease) of artery bypass graft - Aspirin  Anemia - h/o Iron deficiency anemia followed by Dr Julien Nordmann - cont oral iron- given IV Iron on 7/16    S/P AVR    Chronic systolic CHF (congestive heart failure) -  stopped IVF  - following I and O    Parkinson's disease - Sinemet  BPH/ bladder issues - on Proscar, Flomax and Myrbetriq   DVT prophylaxis: Lovenox Code Status: full code Family Communication:  Disposition Plan: SNF vs LTAC in 2-3 days Consultants:   ID, ortho, plastic surgery Procedures:  2 D ECHO 07/01/16 Study Conclusions  - Left ventricle: The cavity size was normal. Wall thickness was  increased in a pattern of moderate LVH. Systolic function was  normal. The estimated ejection fraction was in the range of 50%  to 55%. Although no diagnostic regional wall motion abnormality  was identified, this possibility cannot be completely excluded on  the basis of this study. - Aortic valve: A bioprosthesis was present and functioning  normally. - Aortic root: The aortic root was mildly dilated. - Mitral valve: There was mild regurgitation. - Left atrium: The atrium was moderately dilated. - Right ventricle: The cavity size was dilated. Wall thickness was  normal. Systolic function was mildly reduced. - Right atrium: The atrium was moderately dilated. - Tricuspid valve: There was mild-moderate regurgitation. - Pulmonary arteries: Systolic pressure was moderately increased.  PA peak pressure: 53 mm Hg (S). Antimicrobials:  Anti-infectives    Start     Dose/Rate Route Frequency Ordered Stop   07/09/16 1000  rifampin (RIFADIN) capsule 300 mg     300 mg Oral Daily 07/08/16 1136     07/07/16 2200  rifampin (RIFADIN) 300 mg in  sodium chloride 0.9 % 100 mL IVPB  Status:  Discontinued     300 mg 200 mL/hr over 30 Minutes Intravenous Every 12 hours 07/07/16 1757 07/08/16 1136   07/07/16 0900  vancomycin (VANCOCIN) IVPB 1000 mg/200 mL premix     1,000 mg 200 mL/hr over 60 Minutes Intravenous Every 36 hours 07/07/16 0821     07/05/16 0813  polymyxin B 500,000 Units, bacitracin 50,000 Units in sodium chloride irrigation 0.9 % 500 mL irrigation  Status:  Discontinued       As  needed 07/05/16 0822 07/05/16 0824   07/05/16 0800  vancomycin (VANCOCIN) IVPB 1000 mg/200 mL premix  Status:  Discontinued     1,000 mg 200 mL/hr over 60 Minutes Intravenous Every 48 hours 07/05/16 0733 07/07/16 0821   07/04/16 1600  fluconazole (DIFLUCAN) IVPB 400 mg  Status:  Discontinued     400 mg 100 mL/hr over 120 Minutes Intravenous Every 24 hours 07/03/16 1606 07/04/16 1157   07/04/16 1600  fluconazole (DIFLUCAN) IVPB 200 mg     200 mg 100 mL/hr over 60 Minutes Intravenous Every 24 hours 07/04/16 1157     07/03/16 1700  fluconazole (DIFLUCAN) IVPB 800 mg     800 mg 100 mL/hr over 240 Minutes Intravenous  Once 07/03/16 1606 07/03/16 2120   06/29/16 2200  vancomycin (VANCOCIN) IVPB 1000 mg/200 mL premix  Status:  Discontinued     1,000 mg 200 mL/hr over 60 Minutes Intravenous Every 12 hours 06/29/16 0939 07/03/16 1218   06/29/16 1600  piperacillin-tazobactam (ZOSYN) IVPB 3.375 g  Status:  Discontinued     3.375 g 12.5 mL/hr over 240 Minutes Intravenous Every 8 hours 06/29/16 0940 07/01/16 1335   06/29/16 0915  piperacillin-tazobactam (ZOSYN) IVPB 3.375 g     3.375 g 100 mL/hr over 30 Minutes Intravenous  Once 06/29/16 0911 06/29/16 0950   06/29/16 0915  vancomycin (VANCOCIN) IVPB 1000 mg/200 mL premix     1,000 mg 200 mL/hr over 60 Minutes Intravenous  Once 06/29/16 0912 06/29/16 1124       Objective: Filed Vitals:   07/07/16 1500 07/07/16 2119 07/08/16 0558 07/08/16 1435  BP: 134/55 135/47 151/63 127/51  Pulse: 61 61 54 74  Temp: 98.7 F (37.1 C) 97.9 F (36.6 C) 97.8 F (36.6 C) 98.2 F (36.8 C)  TempSrc: Oral Oral Oral Oral  Resp: 18 20 18    Height:      Weight:      SpO2: 95% 97% 100%     Intake/Output Summary (Last 24 hours) at 07/08/16 1713 Last data filed at 07/08/16 0700  Gross per 24 hour  Intake    460 ml  Output   1175 ml  Net   -715 ml   Filed Weights   06/29/16 0825 06/29/16 1227  Weight: 81.647 kg (180 lb) 78.9 kg (173 lb 15.1 oz)     Examination: General exam: Appears comfortable, awake and alert. In no acute distress HEENT: PERRLA, oral mucosa moist, no sclera icterus or thrush Respiratory system: Clear to auscultation. Respiratory effort normal. Cardiovascular system: S1 & S2 heard, RRR.  No murmurs  Gastrointestinal system: Abdomen soft, non-tender, nondistended. Normal bowel sound. No organomegaly Central nervous system: Alert and oriented. No focal neurological deficits. Extremities: No cyanosis, clubbing or edema- right knee in dressing Skin: knee in dression Psychiatry:  Mood & affect appropriate.   Data Reviewed: I have personally reviewed following labs and imaging studies  CBC:  Recent Labs Lab 07/02/16 0440 07/03/16  0423 07/04/16 0839 07/07/16 0645  WBC 9.3 10.0 9.4 12.6*  HGB 8.9* 9.7* 9.1* 8.8*  HCT 26.5* 28.9* 27.2* 27.0*  MCV 94.6 94.4 95.8 96.4  PLT 178 263 298 0000000   Basic Metabolic Panel:  Recent Labs Lab 07/02/16 0440 07/03/16 0423 07/04/16 0839 07/06/16 0431 07/07/16 0645  NA 133* 135 133*  --  136  K 4.5 4.4 4.5  --  4.6  CL 103 101 99*  --  102  CO2 24 27 27   --  27  GLUCOSE 276* 240* 226*  --  146*  BUN 31* 32* 28*  --  29*  CREATININE 0.78 0.86 0.82 0.89 0.85  CALCIUM 8.3* 8.8* 8.4*  --  8.2*   GFR: Estimated Creatinine Clearance: 56.2 mL/min (by C-G formula based on Cr of 0.85). Liver Function Tests: No results for input(s): AST, ALT, ALKPHOS, BILITOT, PROT, ALBUMIN in the last 168 hours. No results for input(s): LIPASE, AMYLASE in the last 168 hours. No results for input(s): AMMONIA in the last 168 hours. Coagulation Profile: No results for input(s): INR, PROTIME in the last 168 hours. Cardiac Enzymes: No results for input(s): CKTOTAL, CKMB, CKMBINDEX, TROPONINI in the last 168 hours. BNP (last 3 results) No results for input(s): PROBNP in the last 8760 hours. HbA1C: No results for input(s): HGBA1C in the last 72 hours. CBG:  Recent Labs Lab  07/07/16 1201 07/07/16 1711 07/07/16 2126 07/08/16 0731 07/08/16 1202  GLUCAP 382* 127* 372* 127* 331*   Lipid Profile: No results for input(s): CHOL, HDL, LDLCALC, TRIG, CHOLHDL, LDLDIRECT in the last 72 hours. Thyroid Function Tests: No results for input(s): TSH, T4TOTAL, FREET4, T3FREE, THYROIDAB in the last 72 hours. Anemia Panel: No results for input(s): VITAMINB12, FOLATE, FERRITIN, TIBC, IRON, RETICCTPCT in the last 72 hours. Urine analysis:    Component Value Date/Time   COLORURINE YELLOW 06/29/2016 0907   APPEARANCEUR CLOUDY* 06/29/2016 0907   LABSPEC 1.026 06/29/2016 0907   PHURINE 5.5 06/29/2016 0907   GLUCOSEU 100* 06/29/2016 0907   HGBUR NEGATIVE 06/29/2016 0907   HGBUR negative 02/25/2010 0743   BILIRUBINUR NEGATIVE 06/29/2016 0907   BILIRUBINUR n 02/25/2016 1237   KETONESUR NEGATIVE 06/29/2016 0907   PROTEINUR 100* 06/29/2016 0907   PROTEINUR 1+ 02/25/2016 1237   UROBILINOGEN 0.2 02/25/2016 1237   UROBILINOGEN 0.2 08/20/2010 1651   NITRITE NEGATIVE 06/29/2016 0907   NITRITE n 02/25/2016 1237   LEUKOCYTESUR NEGATIVE 06/29/2016 0907   Sepsis Labs: @LABRCNTIP (procalcitonin:4,lacticidven:4) ) Recent Results (from the past 240 hour(s))  Culture, blood (Routine X 2)     Status: Abnormal   Collection Time: 06/29/16  8:45 AM  Result Value Ref Range Status   Specimen Description BLOOD RIGHT ANTECUBITAL  Final   Special Requests BOTTLES DRAWN AEROBIC AND ANAEROBIC 5ML  Final   Culture  Setup Time   Final    GRAM POSITIVE COCCI IN CLUSTERS IN BOTH AEROBIC AND ANAEROBIC BOTTLES CRITICAL RESULT CALLED TO, READ BACK BY AND VERIFIED WITH: MLT T.MULLINS [id number] @0710  MLM CRITICAL RESULT CALLED TO, READ BACK BY AND VERIFIED WITH: SEAY,K. RN AT [id number] 06/30/16 MULLINS,T Performed at [facility]    Culture STAPHYLOCOCCUS SPECIES (COAGULASE NEGATIVE) (A)  Final   Report Status 07/02/2016 FINAL  Final   Organism ID, Bacteria STAPHYLOCOCCUS SPECIES (COAGULASE  NEGATIVE)  Final      Susceptibility   Staphylococcus species (coagulase negative) - MIC*    CIPROFLOXACIN 2 INTERMEDIATE Intermediate     ERYTHROMYCIN <=0.25 SENSITIVE Sensitive  GENTAMICIN 1 SENSITIVE Sensitive     OXACILLIN >=4 RESISTANT Resistant     TETRACYCLINE <=1 SENSITIVE Sensitive     VANCOMYCIN <=0.5 SENSITIVE Sensitive     TRIMETH/SULFA <=10 SENSITIVE Sensitive     CLINDAMYCIN <=0.25 SENSITIVE Sensitive     RIFAMPIN <=0.5 SENSITIVE Sensitive     Inducible Clindamycin NEGATIVE Sensitive     * STAPHYLOCOCCUS SPECIES (COAGULASE NEGATIVE)  Blood Culture ID Panel (Reflexed)     Status: None   Collection Time: 06/29/16  8:45 AM  Result Value Ref Range Status   Enterococcus species NOT DETECTED NOT DETECTED Final   Vancomycin resistance NOT DETECTED NOT DETECTED Final   Listeria monocytogenes NOT DETECTED NOT DETECTED Final   Staphylococcus species NOT DETECTED NOT DETECTED Final   Staphylococcus aureus NOT DETECTED NOT DETECTED Final   Methicillin resistance NOT DETECTED NOT DETECTED Final   Streptococcus species NOT DETECTED NOT DETECTED Final   Streptococcus agalactiae NOT DETECTED NOT DETECTED Final   Streptococcus pneumoniae NOT DETECTED NOT DETECTED Final   Streptococcus pyogenes NOT DETECTED NOT DETECTED Final   Acinetobacter baumannii NOT DETECTED NOT DETECTED Final   Enterobacteriaceae species NOT DETECTED NOT DETECTED Final   Enterobacter cloacae complex NOT DETECTED NOT DETECTED Final   Escherichia coli NOT DETECTED NOT DETECTED Final   Klebsiella oxytoca NOT DETECTED NOT DETECTED Final   Klebsiella pneumoniae NOT DETECTED NOT DETECTED Final   Proteus species NOT DETECTED NOT DETECTED Final   Serratia marcescens NOT DETECTED NOT DETECTED Final   Carbapenem resistance NOT DETECTED NOT DETECTED Final   Haemophilus influenzae NOT DETECTED NOT DETECTED Final   Neisseria meningitidis NOT DETECTED NOT DETECTED Final   Pseudomonas aeruginosa NOT DETECTED NOT DETECTED  Final   Candida albicans NOT DETECTED NOT DETECTED Final   Candida glabrata NOT DETECTED NOT DETECTED Final   Candida krusei NOT DETECTED NOT DETECTED Final   Candida parapsilosis NOT DETECTED NOT DETECTED Final   Candida tropicalis NOT DETECTED NOT DETECTED Final    Comment: Performed at [facility]  Culture, blood (Routine X 2)     Status: Abnormal   Collection Time: 06/29/16  8:47 AM  Result Value Ref Range Status   Specimen Description BLOOD LEFT HAND  Final   Special Requests BOTTLES DRAWN AEROBIC AND ANAEROBIC 5CC  Final   Culture  Setup Time   Final    GRAM POSITIVE COCCI IN CLUSTERS IN BOTH AEROBIC AND ANAEROBIC BOTTLES CRITICAL VALUE NOTED.  VALUE IS CONSISTENT WITH PREVIOUSLY REPORTED AND CALLED VALUE.    Culture (A)  Final    STAPHYLOCOCCUS SPECIES (COAGULASE NEGATIVE) SUSCEPTIBILITIES PERFORMED ON PREVIOUS CULTURE WITHIN THE LAST 5 DAYS. Performed at [facility]    Report Status 07/03/2016 FINAL  Final  Wound or Superficial Culture     Status: None   Collection Time: 06/29/16  8:47 AM  Result Value Ref Range Status   Specimen Description WOUND RIGHT KNEE  Final   Special Requests Normal  Final   Gram Stain   Final    FEW WBC PRESENT, PREDOMINANTLY PMN FEW GRAM POSITIVE COCCI IN PAIRS Performed at [facility]    Culture   Final    MODERATE STAPHYLOCOCCUS SPECIES (COAGULASE NEGATIVE) MODERATE CANDIDA ALBICANS    Report Status 07/03/2016 FINAL  Final   Organism ID, Bacteria STAPHYLOCOCCUS SPECIES (COAGULASE NEGATIVE)  Final      Susceptibility   Staphylococcus species (coagulase negative) - MIC*    CIPROFLOXACIN 2 INTERMEDIATE Intermediate  ERYTHROMYCIN <=0.25 SENSITIVE Sensitive     GENTAMICIN 1 SENSITIVE Sensitive     OXACILLIN >=4 RESISTANT Resistant     TETRACYCLINE <=1 SENSITIVE Sensitive     VANCOMYCIN <=0.5 SENSITIVE Sensitive     TRIMETH/SULFA <=10 SENSITIVE Sensitive     CLINDAMYCIN <=0.25 SENSITIVE Sensitive     RIFAMPIN  <=0.5 SENSITIVE Sensitive     Inducible Clindamycin NEGATIVE Sensitive     * MODERATE STAPHYLOCOCCUS SPECIES (COAGULASE NEGATIVE)  Urine C&S     Status: Abnormal   Collection Time: 06/29/16  9:07 AM  Result Value Ref Range Status   Specimen Description URINE, CLEAN CATCH  Final   Special Requests Normal  Final   Culture 40,000 COLONIES/mL ENTEROBACTER AEROGENES (A)  Final   Report Status 07/01/2016 FINAL  Final   Organism ID, Bacteria ENTEROBACTER AEROGENES (A)  Final      Susceptibility   Enterobacter aerogenes - MIC*    CEFAZOLIN >=64 RESISTANT Resistant     CEFTRIAXONE >=64 RESISTANT Resistant     CIPROFLOXACIN <=0.25 SENSITIVE Sensitive     GENTAMICIN <=1 SENSITIVE Sensitive     IMIPENEM <=0.25 SENSITIVE Sensitive     NITROFURANTOIN 64 INTERMEDIATE Intermediate     TRIMETH/SULFA <=20 SENSITIVE Sensitive     PIP/TAZO >=128 RESISTANT Resistant     * 40,000 COLONIES/mL ENTEROBACTER AEROGENES  MRSA PCR Screening     Status: None   Collection Time: 06/29/16  1:27 PM  Result Value Ref Range Status   MRSA by PCR NEGATIVE NEGATIVE Final    Comment:        The GeneXpert MRSA Assay (FDA approved for NASAL specimens only), is one component of a comprehensive MRSA colonization surveillance program. It is not intended to diagnose MRSA infection nor to guide or monitor treatment for MRSA infections.   Culture, blood (Routine X 2) w Reflex to ID Panel     Status: None   Collection Time: 06/30/16 12:37 PM  Result Value Ref Range Status   Specimen Description BLOOD LEFT ARM  Final   Special Requests BOTTLES DRAWN AEROBIC ONLY 5CC  Final   Culture   Final    NO GROWTH 5 DAYS Performed at [facility]    Report Status 07/05/2016 FINAL  Final  Culture, blood (Routine X 2) w Reflex to ID Panel     Status: Abnormal   Collection Time: 06/30/16 12:41 PM  Result Value Ref Range Status   Specimen Description BLOOD LEFT HAND  Final   Special Requests IN PEDIATRIC BOTTLE 2CC  Final    Culture  Setup Time   Final    GRAM POSITIVE COCCI IN CLUSTERS AEROBIC BOTTLE ONLY CRITICAL RESULT CALLED TO, READ BACK BY AND VERIFIED WITH: T. PICKERING RPH AT 1209 07/01/16 BY D. VANHOOK    Culture (A)  Final    STAPHYLOCOCCUS SPECIES (COAGULASE NEGATIVE) SUSCEPTIBILITIES PERFORMED ON PREVIOUS CULTURE WITHIN THE LAST 5 DAYS. Performed at [facility]    Report Status 07/03/2016 FINAL  Final  Culture, blood (Routine X 2) w Reflex to ID Panel     Status: None   Collection Time: 07/02/16  7:56 AM  Result Value Ref Range Status   Specimen Description BLOOD LEFT ANTECUBITAL  Final   Special Requests BOTTLES DRAWN AEROBIC AND ANAEROBIC 10CC  Final   Culture   Final    NO GROWTH 5 DAYS Performed at [facility]    Report Status 07/07/2016 FINAL  Final  Aerobic/Anaerobic Culture (surgical/deep wound)  Status: None (Preliminary result)   Collection Time: 07/05/16  8:04 AM  Result Value Ref Range Status   Specimen Description SYNOVIAL SWAB OF RT KNEE JOINT  Final   Special Requests NONE  Final   Gram Stain   Final    ABUNDANT WBC PRESENT,BOTH PMN AND MONONUCLEAR MODERATE GRAM POSITIVE COCCI IN CLUSTERS    Culture   Final    MODERATE STAPHYLOCOCCUS SPECIES (COAGULASE NEGATIVE) NO ANAEROBES ISOLATED; CULTURE IN PROGRESS FOR 5 DAYS    Report Status PENDING  Incomplete   Organism ID, Bacteria STAPHYLOCOCCUS SPECIES (COAGULASE NEGATIVE)  Final      Susceptibility   Staphylococcus species (coagulase negative) - MIC*    CIPROFLOXACIN 2 INTERMEDIATE Intermediate     ERYTHROMYCIN <=0.25 SENSITIVE Sensitive     GENTAMICIN 1 SENSITIVE Sensitive     OXACILLIN >=4 RESISTANT Resistant     TETRACYCLINE <=1 SENSITIVE Sensitive     VANCOMYCIN <=0.5 SENSITIVE Sensitive     TRIMETH/SULFA <=10 SENSITIVE Sensitive     CLINDAMYCIN <=0.25 SENSITIVE Sensitive     RIFAMPIN <=0.5 SENSITIVE Sensitive     Inducible Clindamycin NEGATIVE Sensitive     * MODERATE STAPHYLOCOCCUS SPECIES  (COAGULASE NEGATIVE)  Aerobic/Anaerobic Culture (surgical/deep wound)     Status: None (Preliminary result)   Collection Time: 07/05/16  8:10 AM  Result Value Ref Range Status   Specimen Description BONE EXPOSE TIBIA  Final   Special Requests NONE  Final   Gram Stain   Final    MODERATE WBC PRESENT,BOTH PMN AND MONONUCLEAR NO ORGANISMS SEEN    Culture   Final    RARE STAPHYLOCOCCUS SPECIES (COAGULASE NEGATIVE) NO ANAEROBES ISOLATED; CULTURE IN PROGRESS FOR 5 DAYS SUSCEPTIBILITIES PERFORMED ON PREVIOUS CULTURE WITHIN THE LAST 5 DAYS. Performed at [facility]    Report Status PENDING  Incomplete         Radiology Studies: No results found.    Scheduled Meds: . aspirin  81 mg Oral Daily  . calcium-vitamin D  1 tablet Oral BID  . carbidopa-levodopa  2 tablet Oral 4 times per day  . diclofenac  75 mg Oral BID AC  . enoxaparin (LOVENOX) injection  40 mg Subcutaneous Q24H  . feeding supplement (ENSURE ENLIVE)  237 mL Oral TID BM  . finasteride  5 mg Oral Daily  . fluconazole (DIFLUCAN) IV  200 mg Intravenous Q24H  . insulin aspart  0-20 Units Subcutaneous TID WC  . insulin aspart  0-5 Units Subcutaneous QHS  . insulin glargine  25 Units Subcutaneous Daily  . INTEGRA PLUS  1 capsule Oral q morning - 10a  . linagliptin  5 mg Oral Daily  . mirabegron ER  50 mg Oral Daily  . multivitamin with minerals  1 tablet Oral Daily  . pantoprazole  40 mg Oral BID  . pioglitazone  45 mg Oral Daily  . [START ON 07/09/2016] rifampin  300 mg Oral Daily  . rosuvastatin  10 mg Oral Daily  . tamsulosin  0.4 mg Oral QPC breakfast  . vancomycin  1,000 mg Intravenous Q36H  . vitamin A  10,000 Units Oral Daily  . zinc sulfate  220 mg Oral Daily   Continuous Infusions:     LOS: 9 days   Time spent in minutes: 35  Velvet Bathe, MD [facility] Pager: [email or web address] Password [email or web address] 07/08/2016, 5:13 PM

## 2016-07-08 NOTE — Progress Notes (Signed)
CSW is following pt for discharge needs. Pt is still wanting to go to [facility] once stable for discharge. Please consult CSW for any discharge planning.   Kingsley Spittle, [place] Clinical Social Worker [phone]

## 2016-07-08 NOTE — Progress Notes (Signed)
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 with  Principal Problem:   Sepsis ([place]) Active Problems:   Hypothyroidism   Diabetes mellitus without complication ([place])   HTN (hypertension)   Iron deficiency anemia   Obstructive sleep apnea   CAD (coronary artery disease) of artery bypass graft   S/P AVR   Chronic systolic CHF (congestive heart failure) (HCC)   Parkinson's disease (HCC)   Pressure ulcer   Pyrexia   Coag negative Staphylococcus bacteremia   Leg wound, right   Prosthetic valve endocarditis (HCC)   Coagulase negative Staphylococcus bacteremia   Septic shock (HCC)   Candida infection  Subjective: Without problems  Abtx:  Anti-infectives    Start     Dose/Rate Route Frequency Ordered Stop   07/07/16 2200  rifampin (RIFADIN) 300 mg in sodium chloride 0.9 % 100 mL IVPB     300 mg 200 mL/hr over 30 Minutes Intravenous Every 12 hours 07/07/16 1757     07/07/16 0900  vancomycin (VANCOCIN) IVPB 1000 mg/200 mL premix     1,000 mg 200 mL/hr over 60 Minutes Intravenous Every 36 hours 07/07/16 0821     07/05/16 0813  polymyxin B 500,000 Units, bacitracin 50,000 Units in sodium chloride irrigation 0.9 % 500 mL irrigation  Status:  Discontinued       As needed 07/05/16 0822 07/05/16 0824   07/05/16 0800  vancomycin (VANCOCIN) IVPB 1000 mg/200 mL premix  Status:  Discontinued     1,000 mg 200 mL/hr over 60 Minutes Intravenous Every 48 hours 07/05/16 0733 07/07/16 0821   07/04/16 1600  fluconazole (DIFLUCAN) IVPB 400 mg  Status:  Discontinued     400 mg 100 mL/hr over 120 Minutes Intravenous Every 24 hours 07/03/16 1606 07/04/16 1157   07/04/16 1600  fluconazole (DIFLUCAN) IVPB 200 mg     200 mg 100 mL/hr over 60 Minutes Intravenous Every 24 hours 07/04/16 1157     07/03/16 1700  fluconazole (DIFLUCAN) IVPB 800 mg     800 mg 100 mL/hr over 240 Minutes Intravenous  Once 07/03/16 1606 07/03/16 2120   06/29/16 2200  vancomycin (VANCOCIN)  IVPB 1000 mg/200 mL premix  Status:  Discontinued     1,000 mg 200 mL/hr over 60 Minutes Intravenous Every 12 hours 06/29/16 0939 07/03/16 1218   06/29/16 1600  piperacillin-tazobactam (ZOSYN) IVPB 3.375 g  Status:  Discontinued     3.375 g 12.5 mL/hr over 240 Minutes Intravenous Every 8 hours 06/29/16 0940 07/01/16 1335   06/29/16 0915  piperacillin-tazobactam (ZOSYN) IVPB 3.375 g     3.375 g 100 mL/hr over 30 Minutes Intravenous  Once 06/29/16 0911 06/29/16 0950   06/29/16 0915  vancomycin (VANCOCIN) IVPB 1000 mg/200 mL premix     1,000 mg 200 mL/hr over 60 Minutes Intravenous  Once 06/29/16 0912 06/29/16 1124      Medications:  Scheduled: . aspirin  81 mg Oral Daily  . calcium-vitamin D  1 tablet Oral BID  . carbidopa-levodopa  2 tablet Oral 4 times per day  . diclofenac  75 mg Oral BID AC  . enoxaparin (LOVENOX) injection  40 mg Subcutaneous Q24H  . feeding supplement (ENSURE ENLIVE)  237 mL Oral TID BM  . finasteride  5 mg Oral Daily  . fluconazole (DIFLUCAN) IV  200 mg Intravenous Q24H  . insulin aspart  0-20 Units Subcutaneous TID WC  . insulin aspart  0-5 Units Subcutaneous QHS  . insulin  glargine  25 Units Subcutaneous Daily  . INTEGRA PLUS  1 capsule Oral q morning - 10a  . linagliptin  5 mg Oral Daily  . mirabegron ER  50 mg Oral Daily  . multivitamin with minerals  1 tablet Oral Daily  . pantoprazole  40 mg Oral BID  . pioglitazone  45 mg Oral Daily  . rifampin (RIFADIN) IVPB  300 mg Intravenous Q12H  . rosuvastatin  10 mg Oral Daily  . tamsulosin  0.4 mg Oral QPC breakfast  . vancomycin  1,000 mg Intravenous Q36H  . vitamin A  10,000 Units Oral Daily  . zinc sulfate  220 mg Oral Daily    Objective: Vital signs in last 24 hours: Temp:  [97.8 F (36.6 C)-98.7 F (37.1 C)] 97.8 F (36.6 C) (07/20 0558) Pulse Rate:  [54-61] 54 (07/20 0558) Resp:  [18-20] 18 (07/20 0558) BP: (134-151)/(47-63) 151/63 mmHg (07/20 0558) SpO2:  [95 %-100 %] 100 % (07/20  0558)   General appearance: alert, cooperative and no distress Resp: clear to auscultation bilaterally Cardio: regular rate and rhythm GI: normal findings: bowel sounds normal and soft, non-tender Extremities: multiple echymosis. RLE wrapped.   Lab Results  Recent Labs  07/06/16 0431 07/07/16 0645  WBC  --  12.6*  HGB  --  8.8*  HCT  --  27.0*  NA  --  136  K  --  4.6  CL  --  102  CO2  --  27  BUN  --  29*  CREATININE 0.89 0.85   Liver Panel No results for input(s): PROT, ALBUMIN, AST, ALT, ALKPHOS, BILITOT, BILIDIR, IBILI in the last 72 hours. Sedimentation Rate No results for input(s): ESRSEDRATE in the last 72 hours. C-Reactive Protein No results for input(s): CRP in the last 72 hours.  Microbiology: Recent Results (from the past 240 hour(s))  Culture, blood (Routine X 2)     Status: Abnormal   Collection Time: 06/29/16  8:45 AM  Result Value Ref Range Status   Specimen Description BLOOD RIGHT ANTECUBITAL  Final   Special Requests BOTTLES DRAWN AEROBIC AND ANAEROBIC 5ML  Final   Culture  Setup Time   Final    GRAM POSITIVE COCCI IN CLUSTERS IN BOTH AEROBIC AND ANAEROBIC BOTTLES CRITICAL RESULT CALLED TO, READ BACK BY AND VERIFIED WITH: MLT T.MULLINS [id number] @0710  MLM CRITICAL RESULT CALLED TO, READ BACK BY AND VERIFIED WITH: SEAY,K. RN AT [phone] 06/30/16 MULLINS,T Performed at [facility]    Culture STAPHYLOCOCCUS SPECIES (COAGULASE NEGATIVE) (A)  Final   Report Status 07/02/2016 FINAL  Final   Organism ID, Bacteria STAPHYLOCOCCUS SPECIES (COAGULASE NEGATIVE)  Final      Susceptibility   Staphylococcus species (coagulase negative) - MIC*    CIPROFLOXACIN 2 INTERMEDIATE Intermediate     ERYTHROMYCIN <=0.25 SENSITIVE Sensitive     GENTAMICIN 1 SENSITIVE Sensitive     OXACILLIN >=4 RESISTANT Resistant     TETRACYCLINE <=1 SENSITIVE Sensitive     VANCOMYCIN <=0.5 SENSITIVE Sensitive     TRIMETH/SULFA <=10 SENSITIVE Sensitive     CLINDAMYCIN <=0.25  SENSITIVE Sensitive     RIFAMPIN <=0.5 SENSITIVE Sensitive     Inducible Clindamycin NEGATIVE Sensitive     * STAPHYLOCOCCUS SPECIES (COAGULASE NEGATIVE)  Blood Culture ID Panel (Reflexed)     Status: None   Collection Time: 06/29/16  8:45 AM  Result Value Ref Range Status   Enterococcus species NOT DETECTED NOT DETECTED Final   Vancomycin resistance NOT DETECTED NOT  DETECTED Final   Listeria monocytogenes NOT DETECTED NOT DETECTED Final   Staphylococcus species NOT DETECTED NOT DETECTED Final   Staphylococcus aureus NOT DETECTED NOT DETECTED Final   Methicillin resistance NOT DETECTED NOT DETECTED Final   Streptococcus species NOT DETECTED NOT DETECTED Final   Streptococcus agalactiae NOT DETECTED NOT DETECTED Final   Streptococcus pneumoniae NOT DETECTED NOT DETECTED Final   Streptococcus pyogenes NOT DETECTED NOT DETECTED Final   Acinetobacter baumannii NOT DETECTED NOT DETECTED Final   Enterobacteriaceae species NOT DETECTED NOT DETECTED Final   Enterobacter cloacae complex NOT DETECTED NOT DETECTED Final   Escherichia coli NOT DETECTED NOT DETECTED Final   Klebsiella oxytoca NOT DETECTED NOT DETECTED Final   Klebsiella pneumoniae NOT DETECTED NOT DETECTED Final   Proteus species NOT DETECTED NOT DETECTED Final   Serratia marcescens NOT DETECTED NOT DETECTED Final   Carbapenem resistance NOT DETECTED NOT DETECTED Final   Haemophilus influenzae NOT DETECTED NOT DETECTED Final   Neisseria meningitidis NOT DETECTED NOT DETECTED Final   Pseudomonas aeruginosa NOT DETECTED NOT DETECTED Final   Candida albicans NOT DETECTED NOT DETECTED Final   Candida glabrata NOT DETECTED NOT DETECTED Final   Candida krusei NOT DETECTED NOT DETECTED Final   Candida parapsilosis NOT DETECTED NOT DETECTED Final   Candida tropicalis NOT DETECTED NOT DETECTED Final    Comment: Performed at [facility]  Culture, blood (Routine X 2)     Status: Abnormal   Collection Time: 06/29/16  8:47 AM   Result Value Ref Range Status   Specimen Description BLOOD LEFT HAND  Final   Special Requests BOTTLES DRAWN AEROBIC AND ANAEROBIC 5CC  Final   Culture  Setup Time   Final    GRAM POSITIVE COCCI IN CLUSTERS IN BOTH AEROBIC AND ANAEROBIC BOTTLES CRITICAL VALUE NOTED.  VALUE IS CONSISTENT WITH PREVIOUSLY REPORTED AND CALLED VALUE.    Culture (A)  Final    STAPHYLOCOCCUS SPECIES (COAGULASE NEGATIVE) SUSCEPTIBILITIES PERFORMED ON PREVIOUS CULTURE WITHIN THE LAST 5 DAYS. Performed at [facility]    Report Status 07/03/2016 FINAL  Final  Wound or Superficial Culture     Status: None   Collection Time: 06/29/16  8:47 AM  Result Value Ref Range Status   Specimen Description WOUND RIGHT KNEE  Final   Special Requests Normal  Final   Gram Stain   Final    FEW WBC PRESENT, PREDOMINANTLY PMN FEW GRAM POSITIVE COCCI IN PAIRS Performed at [facility]    Culture   Final    MODERATE STAPHYLOCOCCUS SPECIES (COAGULASE NEGATIVE) MODERATE CANDIDA ALBICANS    Report Status 07/03/2016 FINAL  Final   Organism ID, Bacteria STAPHYLOCOCCUS SPECIES (COAGULASE NEGATIVE)  Final      Susceptibility   Staphylococcus species (coagulase negative) - MIC*    CIPROFLOXACIN 2 INTERMEDIATE Intermediate     ERYTHROMYCIN <=0.25 SENSITIVE Sensitive     GENTAMICIN 1 SENSITIVE Sensitive     OXACILLIN >=4 RESISTANT Resistant     TETRACYCLINE <=1 SENSITIVE Sensitive     VANCOMYCIN <=0.5 SENSITIVE Sensitive     TRIMETH/SULFA <=10 SENSITIVE Sensitive     CLINDAMYCIN <=0.25 SENSITIVE Sensitive     RIFAMPIN <=0.5 SENSITIVE Sensitive     Inducible Clindamycin NEGATIVE Sensitive     * MODERATE STAPHYLOCOCCUS SPECIES (COAGULASE NEGATIVE)  Urine C&S     Status: Abnormal   Collection Time: 06/29/16  9:07 AM  Result Value Ref Range Status   Specimen Description URINE, CLEAN CATCH  Final  Special Requests Normal  Final   Culture 40,000 COLONIES/mL ENTEROBACTER AEROGENES (A)  Final   Report Status  07/01/2016 FINAL  Final   Organism ID, Bacteria ENTEROBACTER AEROGENES (A)  Final      Susceptibility   Enterobacter aerogenes - MIC*    CEFAZOLIN >=64 RESISTANT Resistant     CEFTRIAXONE >=64 RESISTANT Resistant     CIPROFLOXACIN <=0.25 SENSITIVE Sensitive     GENTAMICIN <=1 SENSITIVE Sensitive     IMIPENEM <=0.25 SENSITIVE Sensitive     NITROFURANTOIN 64 INTERMEDIATE Intermediate     TRIMETH/SULFA <=20 SENSITIVE Sensitive     PIP/TAZO >=128 RESISTANT Resistant     * 40,000 COLONIES/mL ENTEROBACTER AEROGENES  MRSA PCR Screening     Status: None   Collection Time: 06/29/16  1:27 PM  Result Value Ref Range Status   MRSA by PCR NEGATIVE NEGATIVE Final    Comment:        The GeneXpert MRSA Assay (FDA approved for NASAL specimens only), is one component of a comprehensive MRSA colonization surveillance program. It is not intended to diagnose MRSA infection nor to guide or monitor treatment for MRSA infections.   Culture, blood (Routine X 2) w Reflex to ID Panel     Status: None   Collection Time: 06/30/16 12:37 PM  Result Value Ref Range Status   Specimen Description BLOOD LEFT ARM  Final   Special Requests BOTTLES DRAWN AEROBIC ONLY 5CC  Final   Culture   Final    NO GROWTH 5 DAYS Performed at [facility]    Report Status 07/05/2016 FINAL  Final  Culture, blood (Routine X 2) w Reflex to ID Panel     Status: Abnormal   Collection Time: 06/30/16 12:41 PM  Result Value Ref Range Status   Specimen Description BLOOD LEFT HAND  Final   Special Requests IN PEDIATRIC BOTTLE 2CC  Final   Culture  Setup Time   Final    GRAM POSITIVE COCCI IN CLUSTERS AEROBIC BOTTLE ONLY CRITICAL RESULT CALLED TO, READ BACK BY AND VERIFIED WITH: T. PICKERING RPH AT 1209 07/01/16 BY D. VANHOOK    Culture (A)  Final    STAPHYLOCOCCUS SPECIES (COAGULASE NEGATIVE) SUSCEPTIBILITIES PERFORMED ON PREVIOUS CULTURE WITHIN THE LAST 5 DAYS. Performed at [facility]    Report Status  07/03/2016 FINAL  Final  Culture, blood (Routine X 2) w Reflex to ID Panel     Status: None   Collection Time: 07/02/16  7:56 AM  Result Value Ref Range Status   Specimen Description BLOOD LEFT ANTECUBITAL  Final   Special Requests BOTTLES DRAWN AEROBIC AND ANAEROBIC 10CC  Final   Culture   Final    NO GROWTH 5 DAYS Performed at [facility]    Report Status 07/07/2016 FINAL  Final  Aerobic/Anaerobic Culture (surgical/deep wound)     Status: None (Preliminary result)   Collection Time: 07/05/16  8:04 AM  Result Value Ref Range Status   Specimen Description SYNOVIAL SWAB OF RT KNEE JOINT  Final   Special Requests NONE  Final   Gram Stain   Final    ABUNDANT WBC PRESENT,BOTH PMN AND MONONUCLEAR MODERATE GRAM POSITIVE COCCI IN CLUSTERS    Culture   Final    MODERATE STAPHYLOCOCCUS SPECIES (COAGULASE NEGATIVE) NO ANAEROBES ISOLATED; CULTURE IN PROGRESS FOR 5 DAYS    Report Status PENDING  Incomplete   Organism ID, Bacteria STAPHYLOCOCCUS SPECIES (COAGULASE NEGATIVE)  Final      Susceptibility  Staphylococcus species (coagulase negative) - MIC*    CIPROFLOXACIN 2 INTERMEDIATE Intermediate     ERYTHROMYCIN <=0.25 SENSITIVE Sensitive     GENTAMICIN 1 SENSITIVE Sensitive     OXACILLIN >=4 RESISTANT Resistant     TETRACYCLINE <=1 SENSITIVE Sensitive     VANCOMYCIN <=0.5 SENSITIVE Sensitive     TRIMETH/SULFA <=10 SENSITIVE Sensitive     CLINDAMYCIN <=0.25 SENSITIVE Sensitive     RIFAMPIN <=0.5 SENSITIVE Sensitive     Inducible Clindamycin NEGATIVE Sensitive     * MODERATE STAPHYLOCOCCUS SPECIES (COAGULASE NEGATIVE)  Aerobic/Anaerobic Culture (surgical/deep wound)     Status: None (Preliminary result)   Collection Time: 07/05/16  8:10 AM  Result Value Ref Range Status   Specimen Description BONE EXPOSE TIBIA  Final   Special Requests NONE  Final   Gram Stain   Final    MODERATE WBC PRESENT,BOTH PMN AND MONONUCLEAR NO ORGANISMS SEEN    Culture   Final    RARE  STAPHYLOCOCCUS SPECIES (COAGULASE NEGATIVE) NO ANAEROBES ISOLATED; CULTURE IN PROGRESS FOR 5 DAYS SUSCEPTIBILITIES PERFORMED ON PREVIOUS CULTURE WITHIN THE LAST 5 DAYS. Performed at [facility]    Report Status PENDING  Incomplete    Studies/Results: No results found.   Assessment/Plan: Septic arthritis of prosthetic joint Debrided 7-18 Wound Cx has grown MRSA and Candida albicans VAC in place Presence of yeast seems a poor prognostic for his knee His repeat Cx 7-17 showed CNS/MRSE  Staph/MRSE bacteremia (7-11 and 7-12)  Repeat BCx 7-14 ngtd Prev TEE (-) Will decrease his rifampin dose, change to PO Unable to pull PIC due to lack of other access, in retrospect given his negative BCx on7-14 and his 7-17 Cx being wound Cx, this is not needed to be pulled.  Will repeat his BCx today  Prosthetic Ao valve TEE (-)  DM2 uncontrolled FSG remain elevated.   Total days of antibiotics: 9 vanco, 6 fluconazole, 1 rifampin         Bobby Rumpf Infectious Diseases (pager) [phone] [email or web address] 07/08/2016, 11:22 AM  LOS: 9 days

## 2016-07-09 LAB — CBC
HCT: 26.9 % — ABNORMAL LOW (ref 39.0–52.0)
Hemoglobin: 8.6 g/dL — ABNORMAL LOW (ref 13.0–17.0)
MCH: 31.3 pg (ref 26.0–34.0)
MCHC: 32 g/dL (ref 30.0–36.0)
MCV: 97.8 fL (ref 78.0–100.0)
Platelets: 387 K/uL (ref 150–400)
RBC: 2.75 MIL/uL — ABNORMAL LOW (ref 4.22–5.81)
RDW: 15.1 % (ref 11.5–15.5)
WBC: 10.8 K/uL — ABNORMAL HIGH (ref 4.0–10.5)

## 2016-07-09 LAB — GLUCOSE, CAPILLARY
GLUCOSE-CAPILLARY: 164 mg/dL — AB (ref 65–99)
Glucose-Capillary: 86 mg/dL (ref 65–99)
Glucose-Capillary: 344 mg/dL — ABNORMAL HIGH (ref 65–99)

## 2016-07-09 MED ORDER — VANCOMYCIN HCL IN DEXTROSE 1-5 GM/200ML-% IV SOLN: 1000.0000 mg | INTRAVENOUS | Status: AC

## 2016-07-09 MED ORDER — HEPARIN SOD (PORK) LOCK FLUSH 100 UNIT/ML IV SOLN
250.0000 [IU] | INTRAVENOUS | Status: AC | PRN
  Administered 2016-07-09: 250 [IU]

## 2016-07-09 MED ORDER — FLUCONAZOLE IN SODIUM CHLORIDE 200-0.9 MG/100ML-% IV SOLN: 200.0000 mg | INTRAVENOUS | Status: AC

## 2016-07-09 MED ORDER — LINAGLIPTIN 5 MG PO TABS: 5.0000 mg | ORAL_TABLET | Freq: Every day | ORAL | Status: AC

## 2016-07-09 MED ORDER — HYDROCODONE-ACETAMINOPHEN 10-325 MG PO TABS: 1.0000 | ORAL_TABLET | Freq: Four times a day (QID) | ORAL | Status: AC | PRN

## 2016-07-09 MED ORDER — RIFAMPIN 300 MG PO CAPS: 300.0000 mg | ORAL_CAPSULE | Freq: Every day | ORAL | Status: AC

## 2016-07-09 MED ORDER — INSULIN GLARGINE 100 UNIT/ML ~~LOC~~ SOLN
25.0000 [IU] | Freq: Every day | SUBCUTANEOUS | Status: AC
Start: 2016-07-09 — End: ?

## 2016-07-09 NOTE — Discharge Summary (Signed)
Physician Discharge Summary  Stanley Comas MD [id number] DOB: 1925/09/23 DOA: 06/29/2016  PCP: [name], MD  Admit date: 06/29/2016 Discharge date: 07/09/2016  Time spent: > 35 minutes  Recommendations for Outpatient Follow-up:  1. Please see d/c instructions below 2. Monitor Vanc levels and adjust accordingly 3. Call in weekly lab results to infectious desease specialist 4. Ensure f/u with ID and ortho   Discharge Diagnoses:  Principal Problem:   Sepsis ([place]) Active Problems:   Hypothyroidism   Diabetes mellitus without complication ([place])   HTN (hypertension)   Iron deficiency anemia   Obstructive sleep apnea   CAD (coronary artery disease) of artery bypass graft   S/P AVR   Chronic systolic CHF (congestive heart failure) (HCC)   Parkinson's disease (HCC)   Pressure ulcer   Pyrexia   Coag negative Staphylococcus bacteremia   Leg wound, right   Prosthetic valve endocarditis ([place])   Coagulase negative Staphylococcus bacteremia   Septic shock ([place])   Candida infection   Discharge Condition: stable  Diet recommendation: heart healthy  Filed Weights   06/29/16 0825 06/29/16 1227  Weight: 81.647 kg (180 lb) 78.9 kg (173 lb 15.1 oz)    History of present illness:  82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 He has been on keflex for the wound. He presented with confusion, fever, increased drainage and incontinence  Hospital Course:  Principal Problem:  Sepsis - confusion, knee wound and blood cultures growing coag neg staph- repeat cultures on 7/12 also postiive 1/ 2 sets - has bioprosthetic AVR- ECHO > no vegetations noted- recommended TEE which was done and does not show endocarditis - ID managing antibiotics - I and D done of knee per plastic surgery - cultures sent again  - third set of blood  cultures negative- PICC inserted  - d/c to SNF  Active Problems:  Right knee dislocation- prosthetic knee - s/p closed reduction x 2  - superficial wound infection- ortho and plastic surgery eval- see above  Diabetes mellitus without complication  - sugars elevated despite poor PO intake- recently received epidural steroid about 1 month ago - resumed Actos and Januvia - Will continue Lantus  Obstructive sleep apnea CPAP   CAD (coronary artery disease) of artery bypass graft - Aspirin  Anemia - h/o Iron deficiency anemia followed by Dr Julien Nordmann - cont oral iron- given IV Iron on 7/16   S/P AVR   Chronic systolic CHF (congestive heart failure) - stopped IVF - following I and O   Parkinson's disease - Sinemet  BPH/ bladder issues - on Proscar, Flomax and Myrbetriq  Procedures: 2 D ECHO 07/01/16 Study Conclusions  - Left ventricle: The cavity size was normal. Wall thickness was  increased in a pattern of moderate LVH. Systolic function was  normal. The estimated ejection fraction was in the range of 50%  to 55%. Although no diagnostic regional wall motion abnormality  was identified, this possibility cannot be completely excluded on  the basis of this study. - Aortic valve: A bioprosthesis was present and functioning  normally. - Aortic root: The aortic root was mildly dilated. - Mitral valve: There was mild regurgitation. - Left atrium: The atrium was moderately dilated. - Right ventricle: The cavity size was dilated. Wall thickness was  normal. Systolic function was mildly reduced. - Right atrium: The atrium was moderately dilated. -  Tricuspid valve: There was mild-moderate regurgitation. - Pulmonary arteries: Systolic pressure was moderately increased.  PA peak pressure: 53 mm Hg (S).  Consultations:  Plastic surgery  Ortho: Dr. Sharion Dove  ID: Dr. Johnnye Sima  Discharge Exam: Filed Vitals:   07/09/16 0623 07/09/16 1311  BP: 143/61 127/59   Pulse: 59 68  Temp: 97.5 F (36.4 C) 98.2 F (36.8 C)  Resp: 18 19    General: Pt in nad, alert and awake Cardiovascular: rrr, no rubs Respiratory: no increased wob, no wheezes  Discharge Instructions   Discharge Instructions    Call MD for:  redness, tenderness, or signs of infection (pain, swelling, redness, odor or green/yellow discharge around incision site)    Complete by:  As directed      Call MD for:  temperature >100.4    Complete by:  As directed      Diet - low sodium heart healthy    Complete by:  As directed      Discharge instructions    Complete by:  As directed   Please follow up with the infectious disease specialist in 2-3 weeks. Also follow up with your orthopaedic surgeon in the next 1 week. Have pharmacy monitor Vancomycin levels per protocol. Obtain weekly labs given prolonged antibiotic use.     Increase activity slowly    Complete by:  As directed           Current Discharge Medication List    START taking these medications   Details  fluconazole (DIFLUCAN) 200-0.9 MG/100ML-% IVPB Inject 100 mLs (200 mg total) into the vein daily. Qty: 100 mL, Refills: 0    insulin glargine (LANTUS) 100 UNIT/ML injection Inject 0.25 mLs (25 Units total) into the skin daily. Qty: 10 mL, Refills: 0    linagliptin (TRADJENTA) 5 MG TABS tablet Take 1 tablet (5 mg total) by mouth daily. Qty: 30 tablet, Refills: 0    rifampin (RIFADIN) 300 MG capsule Take 1 capsule (300 mg total) by mouth daily. Qty: 45 capsule, Refills: 0    vancomycin (VANCOCIN) 1-5 GM/200ML-% SOLN Inject 200 mLs (1,000 mg total) into the vein every 36 (thirty-six) hours. Qty: 4000 mL, Refills: 0      CONTINUE these medications which have CHANGED   Details  HYDROcodone-acetaminophen (NORCO) 10-325 MG tablet Take 1 tablet by mouth every 6 (six) hours as needed for severe pain. Qty: 120 tablet, Refills: 0      CONTINUE these medications which have NOT CHANGED   Details  aspirin 81 MG  tablet Take 81 mg by mouth daily.    Calcium Carbonate-Vitamin D (CALCIUM-VITAMIN D) 500-200 MG-UNIT per tablet Take 1 tablet by mouth 2 (two) times daily.     carbidopa-levodopa (SINEMET) 25-100 MG tablet Take 1-2 tablets by mouth See admin instructions. 2 in the AM, 1 in the afternoon, 2 in the evening before meals Qty: 450 tablet, Refills: 3    chlorhexidine (PERIDEX) 0.12 % solution Use as directed 15 mLs in the mouth or throat 2 (two) times daily as needed (sore mouth).  Refills: 0   Associated Diagnoses: Iron deficiency anemia    FeFum-FePoly-FA-B Cmp-C-Biot (INTEGRA PLUS) CAPS TAKE 1 CAPSULE EVERY MORNING Qty: 90 capsule, Refills: 3    finasteride (PROSCAR) 5 MG tablet Take 5 mg by mouth daily. Refills: 1   Associated Diagnoses: Iron deficiency anemia    mirabegron ER (MYRBETRIQ) 50 MG TB24 tablet Take 50 mg by mouth daily.    Multiple Vitamin (MULITIVITAMIN WITH MINERALS) TABS Take  1 tablet by mouth daily.    pantoprazole (PROTONIX) 40 MG tablet Take 1 tablet (40 mg total) by mouth 2 (two) times daily. Qty: 60 tablet, Refills: 11    pioglitazone (ACTOS) 45 MG tablet TAKE 1 TABLET EVERY DAY Qty: 90 tablet, Refills: 0    prochlorperazine (COMPAZINE) 10 MG tablet Take 10 mg by mouth every 6 (six) hours as needed. Nausea/vomiting Refills: 0    rosuvastatin (CRESTOR) 10 MG tablet Take 1 tablet (10 mg total) by mouth daily. Qty: 90 tablet, Refills: 3    tamsulosin (FLOMAX) 0.4 MG CAPS capsule Take 0.4 mg by mouth daily after breakfast.    Associated Diagnoses: Iron deficiency anemia    vitamin A 10000 UNIT capsule Take 10,000 Units by mouth daily.    zinc sulfate 220 (50 Zn) MG capsule Take 220 mg by mouth daily.    glucose blood (ONE TOUCH TEST STRIPS) test strip Dispense one touch ultra, test TID and diagnosis code is E11.9 Qty: 100 each, Refills: 11    Lancets (ONETOUCH ULTRASOFT) lancets Test once per day and diagnosis code is E 11.9 Qty: 100 each, Refills: 1       STOP taking these medications     amoxicillin (AMOXIL) 500 MG capsule      cephALEXin (KEFLEX) 500 MG capsule      diclofenac (VOLTAREN) 75 MG EC tablet      diphenoxylate-atropine (LOMOTIL) 2.5-0.025 MG tablet      LORazepam (ATIVAN) 0.5 MG tablet      sitaGLIPtin-metformin (JANUMET) 50-1000 MG tablet      traMADol (ULTRAM) 50 MG tablet        Allergies  Allergen Reactions  . Ace Inhibitors Other (See Comments)    cough  . Succinylcholine Other (See Comments)    Prolonged sedation  . Morphine And Related Nausea And Vomiting   Follow-up Information    Follow up with [facility] .   Specialty:  Skilled Nursing Facility   Contact information:   [facility] [phone]       The results of significant diagnostics from this hospitalization (including imaging, microbiology, ancillary and laboratory) are listed below for reference.    Significant Diagnostic Studies: Dg Chest 2 View  06/29/2016  CLINICAL DATA:  82 year old male with a history of fever EXAM: CHEST  2 VIEW COMPARISON:  10/14/2014, 05/14/2014 FINDINGS: Cardiomediastinal silhouette unchanged with cardiomegaly. Surgical changes of prior median sternotomy and CABG, with aortic valve replacement. Low lung volumes with asymmetric elevation the right hemidiaphragm. Coarsened interstitial markings bilaterally. No confluent airspace disease. No pleural effusion or pneumothorax. Degenerative changes of the left greater than right shoulder. Surgical changes of the left humerus incompletely imaged. Calcifications of the aorta IMPRESSION: Chronic lung changes and low lung volumes without evidence of superimposed acute cardiopulmonary disease. Surgical changes of median sternotomy, CABG, aortic valve replacement. Aortic atherosclerosis. Signed, Dulcy Fanny. Earleen Newport, DO [facility] Electronically Signed   By: Corrie Mckusick D.O.    On: 06/29/2016 09:17   Dg Knee 2 Views Right  06/29/2016  CLINICAL DATA:  Fever EXAM: RIGHT KNEE - 1-2 VIEW COMPARISON:  06/09/2016 FINDINGS: No joint effusion. There is scratch set the hardware components of a right total knee arthroplasty device with long stems identified. No periprosthetic fracture or subluxation. No radio-opaque foreign bodies or soft tissue calcifications. IMPRESSION: 1. No acute findings. 2. Previous right knee arthroplasty. Electronically Signed   By: Queen Slough.D.  On: 06/29/2016 09:17   Dg Knee Complete 4 Views Right  06/09/2016  CLINICAL DATA:  Initial encounter for Pt had his right knee dislocated last Saturday and tonight he fell again tonight at home and thinks he may have dislocated it again. Hx multiple surgeries EXAM: RIGHT KNEE - COMPLETE 4+ VIEW COMPARISON:  6/10/ 17 FINDINGS: Long-stem right knee arthroplasty. Vascular calcifications. Posterior dislocation of the tibial component relative to the femoral component. No knee joint effusion. No complicating fracture. IMPRESSION: Posterior dislocation of the tibial component of a total knee arthroplasty. Electronically Signed   By: Abigail Miyamoto M.D.   On: 06/09/2016 21:52    Microbiology: Recent Results (from the past 240 hour(s))  Culture, blood (Routine X 2) w Reflex to ID Panel     Status: None   Collection Time: 06/30/16 12:37 PM  Result Value Ref Range Status   Specimen Description BLOOD LEFT ARM  Final   Special Requests BOTTLES DRAWN AEROBIC ONLY 5CC  Final   Culture   Final    NO GROWTH 5 DAYS Performed at [facility]    Report Status 07/05/2016 FINAL  Final  Culture, blood (Routine X 2) w Reflex to ID Panel     Status: Abnormal   Collection Time: 06/30/16 12:41 PM  Result Value Ref Range Status   Specimen Description BLOOD LEFT HAND  Final   Special Requests IN PEDIATRIC BOTTLE 2CC  Final   Culture  Setup Time   Final    GRAM POSITIVE COCCI IN CLUSTERS AEROBIC BOTTLE ONLY CRITICAL  RESULT CALLED TO, READ BACK BY AND VERIFIED WITH: T. PICKERING RPH AT 1209 07/01/16 BY D. VANHOOK    Culture (A)  Final    STAPHYLOCOCCUS SPECIES (COAGULASE NEGATIVE) SUSCEPTIBILITIES PERFORMED ON PREVIOUS CULTURE WITHIN THE LAST 5 DAYS. Performed at [facility]    Report Status 07/03/2016 FINAL  Final  Culture, blood (Routine X 2) w Reflex to ID Panel     Status: None   Collection Time: 07/02/16  7:56 AM  Result Value Ref Range Status   Specimen Description BLOOD LEFT ANTECUBITAL  Final   Special Requests BOTTLES DRAWN AEROBIC AND ANAEROBIC 10CC  Final   Culture   Final    NO GROWTH 5 DAYS Performed at [facility]    Report Status 07/07/2016 FINAL  Final  Aerobic/Anaerobic Culture (surgical/deep wound)     Status: None (Preliminary result)   Collection Time: 07/05/16  8:04 AM  Result Value Ref Range Status   Specimen Description SYNOVIAL SWAB OF RT KNEE JOINT  Final   Special Requests NONE  Final   Gram Stain   Final    ABUNDANT WBC PRESENT,BOTH PMN AND MONONUCLEAR MODERATE GRAM POSITIVE COCCI IN CLUSTERS    Culture   Final    MODERATE STAPHYLOCOCCUS SPECIES (COAGULASE NEGATIVE) NO ANAEROBES ISOLATED; CULTURE IN PROGRESS FOR 5 DAYS    Report Status PENDING  Incomplete   Organism ID, Bacteria STAPHYLOCOCCUS SPECIES (COAGULASE NEGATIVE)  Final      Susceptibility   Staphylococcus species (coagulase negative) - MIC*    CIPROFLOXACIN 2 INTERMEDIATE Intermediate     ERYTHROMYCIN <=0.25 SENSITIVE Sensitive     GENTAMICIN 1 SENSITIVE Sensitive     OXACILLIN >=4 RESISTANT Resistant     TETRACYCLINE <=1 SENSITIVE Sensitive     VANCOMYCIN <=0.5 SENSITIVE Sensitive     TRIMETH/SULFA <=10 SENSITIVE Sensitive     CLINDAMYCIN <=0.25 SENSITIVE Sensitive     RIFAMPIN <=0.5 SENSITIVE Sensitive  Inducible Clindamycin NEGATIVE Sensitive     * MODERATE STAPHYLOCOCCUS SPECIES (COAGULASE NEGATIVE)  Aerobic/Anaerobic Culture (surgical/deep wound)     Status: None  (Preliminary result)   Collection Time: 07/05/16  8:10 AM  Result Value Ref Range Status   Specimen Description BONE EXPOSE TIBIA  Final   Special Requests NONE  Final   Gram Stain   Final    MODERATE WBC PRESENT,BOTH PMN AND MONONUCLEAR NO ORGANISMS SEEN    Culture   Final    RARE STAPHYLOCOCCUS SPECIES (COAGULASE NEGATIVE) NO ANAEROBES ISOLATED; CULTURE IN PROGRESS FOR 5 DAYS SUSCEPTIBILITIES PERFORMED ON PREVIOUS CULTURE WITHIN THE LAST 5 DAYS. Performed at [facility]    Report Status PENDING  Incomplete  Culture, blood (Routine X 2) w Reflex to ID Panel     Status: None (Preliminary result)   Collection Time: 07/08/16 11:55 AM  Result Value Ref Range Status   Specimen Description BLOOD LEFT ARM  Final   Special Requests BOTTLES DRAWN AEROBIC AND ANAEROBIC 10CC  Final   Culture   Final    NO GROWTH < 24 HOURS Performed at [facility]    Report Status PENDING  Incomplete     Labs: Basic Metabolic Panel:  Recent Labs Lab 07/03/16 0423 07/04/16 0839 07/06/16 0431 07/07/16 0645  NA 135 133*  --  136  K 4.4 4.5  --  4.6  CL 101 99*  --  102  CO2 27 27  --  27  GLUCOSE 240* 226*  --  146*  BUN 32* 28*  --  29*  CREATININE 0.86 0.82 0.89 0.85  CALCIUM 8.8* 8.4*  --  8.2*   Liver Function Tests: No results for input(s): AST, ALT, ALKPHOS, BILITOT, PROT, ALBUMIN in the last 168 hours. No results for input(s): LIPASE, AMYLASE in the last 168 hours. No results for input(s): AMMONIA in the last 168 hours. CBC:  Recent Labs Lab 07/03/16 0423 07/04/16 0839 07/07/16 0645 07/09/16 0055  WBC 10.0 9.4 12.6* 10.8*  HGB 9.7* 9.1* 8.8* 8.6*  HCT 28.9* 27.2* 27.0* 26.9*  MCV 94.4 95.8 96.4 97.8  PLT 263 298 385 387   Cardiac Enzymes: No results for input(s): CKTOTAL, CKMB, CKMBINDEX, TROPONINI in the last 168 hours. BNP: BNP (last 3 results) No results for input(s): BNP in the last 8760 hours.  ProBNP (last 3 results) No results for input(s):  PROBNP in the last 8760 hours.  CBG:  Recent Labs Lab 07/08/16 1202 07/08/16 1750 07/08/16 2155 07/09/16 0823 07/09/16 1223  GLUCAP 331* 217* 313* 86 344*   Signed:  Velvet Bathe MD.  [facility] 07/09/2016, 1:48 PM

## 2016-07-09 NOTE — Progress Notes (Signed)
Physical Therapy Treatment Patient Details Name: Stanley ALDER MD MRN: [id number] DOB: Sep 13, 1925 Today's Date: 07/09/2016    History of Present Illness 82 y.o. male with medical history significant of DM, PVD, CAD, S/P CABG, aortic stenosis, S/P AVR,chronic systolic CHF and Parkinson's disease who was admitted with confusion, fever, draining R knee wound, falls at home; sepsis .  Pt with hx of R TKA, R TKR requiring closed manipulation on 05/29/16 and then closed reduction of the patella on 06/09/16. Pt s/p R knee I & D with wound vac placement 07/05/16.    PT Comments    Assisted pt out od recliner + 2 assist to amb 4 feet and then assisted back to bed.    Follow Up Recommendations  SNF     Equipment Recommendations       Recommendations for Other Services       Precautions / Restrictions Precautions Precautions: Fall Required Braces or Orthoses: Knee Immobilizer - Right Knee Immobilizer - Right: On when out of bed or walking Other Brace/Splint: knee brace due to lack of extensor mechanism, also has KI in room Restrictions Weight Bearing Restrictions: No    Mobility  Bed Mobility Overal bed mobility: Needs Assistance Bed Mobility: Supine to Sit     Supine to sit: Mod assist;Max assist  sit to supine MAX assist   General bed mobility comments: Assisted back to bed  Transfers Overall transfer level: Needs assistance Equipment used: None Transfers: Sit to/from Stand Sit to Stand: Mod assist;+2 safety/equipment;From elevated surface;Max assist         General transfer comment: cues for hand placement and assisted off elevated bed   Ambulation/Gait Ambulation/Gait assistance: Mod assist;+2 physical assistance Ambulation Distance (Feet): 4 Feet Assistive device: Bilateral platform walker (EVA walker ) Gait Pattern/deviations: Step-to pattern;Trunk flexed Gait velocity: decreased   General Gait Details: used B platform EVA walker for increased support,  amb a  limited distance from recliner to bed   Stairs            Wheelchair Mobility    Modified Rankin (Stroke Patients Only)       Balance                                    Cognition Arousal/Alertness: Awake/alert Behavior During Therapy: WFL for tasks assessed/performed Overall Cognitive Status: Within Functional Limits for tasks assessed                      Exercises      General Comments        Pertinent Vitals/Pain Pain Assessment: 0-10 Pain Score: 3  Pain Location: R knee Pain Descriptors / Indicators: Grimacing Pain Intervention(s): Monitored during session;Repositioned    Home Living                      Prior Function            PT Goals (current goals can now be found in the care plan section) Progress towards PT goals: Progressing toward goals    Frequency  Min 3X/week    PT Plan Current plan remains appropriate    Co-evaluation             End of Session Equipment Utilized During Treatment: Gait belt Activity Tolerance: Patient limited by fatigue Patient left: in chair;with call bell/phone within reach     Time: [phone]  PT Time Calculation (min) (ACUTE ONLY): 17 min  Charges:  $Gait Training: 8-22 mins                    G Codes:      [name]  PTA [facility]  Acute  Rehab Pager      [phone]

## 2016-07-09 NOTE — Progress Notes (Signed)
Patient is set to discharge to [facility] today. Patient & spouse, Collie Siad, aware. Discharge packet given to RN, Hinton Dyer. [facility] called for transport.   Kingsley Spittle, [place] Clinical Social Worker [phone]

## 2016-07-09 NOTE — Clinical Social Work Placement (Signed)
   CLINICAL SOCIAL WORK PLACEMENT  NOTE  Date:  07/09/2016  Patient Details  Name: RAMIRO COLE MD MRN: [id number] Date of Birth: Apr 30, 1925  Clinical Social Work is seeking post-discharge placement for this patient at the [place] level of care (*CSW will initial, date and re-position this form in  chart as items are completed):  Yes   Patient/family provided with [facility] Work Department's list of facilities offering this level of care within the geographic area requested by the patient (or if unable, by the patient's family).  Yes   Patient/family informed of their freedom to choose among providers that offer the needed level of care, that participate in Medicare, Medicaid or managed care program needed by the patient, have an available bed and are willing to accept the patient.  Yes   Patient/family informed of [facility]'s ownership interest in [facility] and [facility], as well as of the fact that they are under no obligation to receive care at these facilities.  PASRR submitted to EDS on 07/02/16     PASRR number received on 07/02/16     Existing PASRR number confirmed on       FL2 transmitted to all facilities in geographic area requested by pt/family on 07/02/16     FL2 transmitted to all facilities within larger geographic area on       Patient informed that his/her managed care company has contracts with or will negotiate with certain facilities, including the following:        Yes   Patient/family informed of bed offers received.  Patient chooses bed at [facility]     Physician recommends and patient chooses bed at      Patient to be transferred to [facility] on 07/09/16.  Patient to be transferred to facility by [facility]     Patient family notified on 07/09/16 of transfer.  Name of family member notified:  Spouse     PHYSICIAN       Additional Comment:     _______________________________________________ Weston Anna, LCSW 07/09/2016, 12:47 PM

## 2016-07-09 NOTE — Progress Notes (Signed)
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 with  Principal Problem:   Sepsis ([place]) Active Problems:   Hypothyroidism   Diabetes mellitus without complication ([place])   HTN (hypertension)   Iron deficiency anemia   Obstructive sleep apnea   CAD (coronary artery disease) of artery bypass graft   S/P AVR   Chronic systolic CHF (congestive heart failure) (HCC)   Parkinson's disease (HCC)   Pressure ulcer   Pyrexia   Coag negative Staphylococcus bacteremia   Leg wound, right   Prosthetic valve endocarditis (HCC)   Coagulase negative Staphylococcus bacteremia   Septic shock (HCC)   Candida infection  Subjective: Resting quietly  Abtx:  Anti-infectives    Start     Dose/Rate Route Frequency Ordered Stop   07/09/16 1000  rifampin (RIFADIN) capsule 300 mg     300 mg Oral Daily 07/08/16 1136     07/09/16 0000  fluconazole (DIFLUCAN) 200-0.9 MG/100ML-% IVPB    Comments:  Patient will need 6 wks of therapy please dispense quantity sufficient.   200 mg 100 mL/hr over 60 Minutes Intravenous Every 24 hours 07/09/16 1348     07/09/16 0000  rifampin (RIFADIN) 300 MG capsule    Comments:  Dispense enough for 6 wks of therapy   300 mg Oral Daily 07/09/16 1348     07/09/16 0000  vancomycin (VANCOCIN) 1-5 GM/200ML-% SOLN    Comments:  Dispense enough for 6 wks of therapy   1,000 mg 200 mL/hr over 60 Minutes Intravenous Every 36 hours 07/09/16 1348     07/07/16 2200  rifampin (RIFADIN) 300 mg in sodium chloride 0.9 % 100 mL IVPB  Status:  Discontinued     300 mg 200 mL/hr over 30 Minutes Intravenous Every 12 hours 07/07/16 1757 07/08/16 1136   07/07/16 0900  vancomycin (VANCOCIN) IVPB 1000 mg/200 mL premix     1,000 mg 200 mL/hr over 60 Minutes Intravenous Every 36 hours 07/07/16 0821     07/05/16 0813  polymyxin B 500,000 Units, bacitracin 50,000 Units in sodium chloride irrigation 0.9 % 500 mL irrigation  Status:  Discontinued       As needed 07/05/16  0822 07/05/16 0824   07/05/16 0800  vancomycin (VANCOCIN) IVPB 1000 mg/200 mL premix  Status:  Discontinued     1,000 mg 200 mL/hr over 60 Minutes Intravenous Every 48 hours 07/05/16 0733 07/07/16 0821   07/04/16 1600  fluconazole (DIFLUCAN) IVPB 400 mg  Status:  Discontinued     400 mg 100 mL/hr over 120 Minutes Intravenous Every 24 hours 07/03/16 1606 07/04/16 1157   07/04/16 1600  fluconazole (DIFLUCAN) IVPB 200 mg     200 mg 100 mL/hr over 60 Minutes Intravenous Every 24 hours 07/04/16 1157     07/03/16 1700  fluconazole (DIFLUCAN) IVPB 800 mg     800 mg 100 mL/hr over 240 Minutes Intravenous  Once 07/03/16 1606 07/03/16 2120   06/29/16 2200  vancomycin (VANCOCIN) IVPB 1000 mg/200 mL premix  Status:  Discontinued     1,000 mg 200 mL/hr over 60 Minutes Intravenous Every 12 hours 06/29/16 0939 07/03/16 1218   06/29/16 1600  piperacillin-tazobactam (ZOSYN) IVPB 3.375 g  Status:  Discontinued     3.375 g 12.5 mL/hr over 240 Minutes Intravenous Every 8 hours 06/29/16 0940 07/01/16 1335   06/29/16 0915  piperacillin-tazobactam (ZOSYN) IVPB 3.375 g     3.375 g 100 mL/hr over 30 Minutes Intravenous  Once  06/29/16 0911 06/29/16 0950   06/29/16 0915  vancomycin (VANCOCIN) IVPB 1000 mg/200 mL premix     1,000 mg 200 mL/hr over 60 Minutes Intravenous  Once 06/29/16 0912 06/29/16 1124      Medications:  Scheduled: . aspirin  81 mg Oral Daily  . calcium-vitamin D  1 tablet Oral BID  . carbidopa-levodopa  2 tablet Oral 4 times per day  . diclofenac  75 mg Oral BID AC  . enoxaparin (LOVENOX) injection  40 mg Subcutaneous Q24H  . feeding supplement (ENSURE ENLIVE)  237 mL Oral TID BM  . finasteride  5 mg Oral Daily  . fluconazole (DIFLUCAN) IV  200 mg Intravenous Q24H  . insulin aspart  0-20 Units Subcutaneous TID WC  . insulin aspart  0-5 Units Subcutaneous QHS  . insulin glargine  25 Units Subcutaneous Daily  . INTEGRA PLUS  1 capsule Oral q morning - 10a  . linagliptin  5 mg Oral  Daily  . mirabegron ER  50 mg Oral Daily  . multivitamin with minerals  1 tablet Oral Daily  . pantoprazole  40 mg Oral BID  . pioglitazone  45 mg Oral Daily  . rifampin  300 mg Oral Daily  . rosuvastatin  10 mg Oral Daily  . tamsulosin  0.4 mg Oral QPC breakfast  . vancomycin  1,000 mg Intravenous Q36H  . vitamin A  10,000 Units Oral Daily  . zinc sulfate  220 mg Oral Daily    Objective: Vital signs in last 24 hours: Temp:  [97.5 F (36.4 C)-98.2 F (36.8 C)] 98.2 F (36.8 C) (07/21 1311) Pulse Rate:  [54-68] 68 (07/21 1311) Resp:  [18-22] 19 (07/21 1311) BP: (127-143)/(50-61) 127/59 mmHg (07/21 1311) SpO2:  [96 %-98 %] 96 % (07/21 1311)   General appearance: resting quietly.   Lab Results  Recent Labs  07/07/16 0645 07/09/16 0055  WBC 12.6* 10.8*  HGB 8.8* 8.6*  HCT 27.0* 26.9*  NA 136  --   K 4.6  --   CL 102  --   CO2 27  --   BUN 29*  --   CREATININE 0.85  --    Liver Panel No results for input(s): PROT, ALBUMIN, AST, ALT, ALKPHOS, BILITOT, BILIDIR, IBILI in the last 72 hours. Sedimentation Rate No results for input(s): ESRSEDRATE in the last 72 hours. C-Reactive Protein No results for input(s): CRP in the last 72 hours.  Microbiology: Recent Results (from the past 240 hour(s))  Culture, blood (Routine X 2) w Reflex to ID Panel     Status: None   Collection Time: 06/30/16 12:37 PM  Result Value Ref Range Status   Specimen Description BLOOD LEFT ARM  Final   Special Requests BOTTLES DRAWN AEROBIC ONLY 5CC  Final   Culture   Final    NO GROWTH 5 DAYS Performed at [facility]    Report Status 07/05/2016 FINAL  Final  Culture, blood (Routine X 2) w Reflex to ID Panel     Status: Abnormal   Collection Time: 06/30/16 12:41 PM  Result Value Ref Range Status   Specimen Description BLOOD LEFT HAND  Final   Special Requests IN PEDIATRIC BOTTLE 2CC  Final   Culture  Setup Time   Final    GRAM POSITIVE COCCI IN CLUSTERS AEROBIC BOTTLE  ONLY CRITICAL RESULT CALLED TO, READ BACK BY AND VERIFIED WITH: T. Falcon RPH AT 1209 07/01/16 BY D. VANHOOK    Culture (A)  Final  STAPHYLOCOCCUS SPECIES (COAGULASE NEGATIVE) SUSCEPTIBILITIES PERFORMED ON PREVIOUS CULTURE WITHIN THE LAST 5 DAYS. Performed at [facility]    Report Status 07/03/2016 FINAL  Final  Culture, blood (Routine X 2) w Reflex to ID Panel     Status: None   Collection Time: 07/02/16  7:56 AM  Result Value Ref Range Status   Specimen Description BLOOD LEFT ANTECUBITAL  Final   Special Requests BOTTLES DRAWN AEROBIC AND ANAEROBIC 10CC  Final   Culture   Final    NO GROWTH 5 DAYS Performed at [facility]    Report Status 07/07/2016 FINAL  Final  Aerobic/Anaerobic Culture (surgical/deep wound)     Status: None (Preliminary result)   Collection Time: 07/05/16  8:04 AM  Result Value Ref Range Status   Specimen Description SYNOVIAL SWAB OF RT KNEE JOINT  Final   Special Requests NONE  Final   Gram Stain   Final    ABUNDANT WBC PRESENT,BOTH PMN AND MONONUCLEAR MODERATE GRAM POSITIVE COCCI IN CLUSTERS    Culture   Final    MODERATE STAPHYLOCOCCUS SPECIES (COAGULASE NEGATIVE) NO ANAEROBES ISOLATED; CULTURE IN PROGRESS FOR 5 DAYS    Report Status PENDING  Incomplete   Organism ID, Bacteria STAPHYLOCOCCUS SPECIES (COAGULASE NEGATIVE)  Final      Susceptibility   Staphylococcus species (coagulase negative) - MIC*    CIPROFLOXACIN 2 INTERMEDIATE Intermediate     ERYTHROMYCIN <=0.25 SENSITIVE Sensitive     GENTAMICIN 1 SENSITIVE Sensitive     OXACILLIN >=4 RESISTANT Resistant     TETRACYCLINE <=1 SENSITIVE Sensitive     VANCOMYCIN <=0.5 SENSITIVE Sensitive     TRIMETH/SULFA <=10 SENSITIVE Sensitive     CLINDAMYCIN <=0.25 SENSITIVE Sensitive     RIFAMPIN <=0.5 SENSITIVE Sensitive     Inducible Clindamycin NEGATIVE Sensitive     * MODERATE STAPHYLOCOCCUS SPECIES (COAGULASE NEGATIVE)  Aerobic/Anaerobic Culture (surgical/deep wound)     Status:  None (Preliminary result)   Collection Time: 07/05/16  8:10 AM  Result Value Ref Range Status   Specimen Description BONE EXPOSE TIBIA  Final   Special Requests NONE  Final   Gram Stain   Final    MODERATE WBC PRESENT,BOTH PMN AND MONONUCLEAR NO ORGANISMS SEEN    Culture   Final    RARE STAPHYLOCOCCUS SPECIES (COAGULASE NEGATIVE) NO ANAEROBES ISOLATED; CULTURE IN PROGRESS FOR 5 DAYS SUSCEPTIBILITIES PERFORMED ON PREVIOUS CULTURE WITHIN THE LAST 5 DAYS. Performed at [facility]    Report Status PENDING  Incomplete  Culture, blood (Routine X 2) w Reflex to ID Panel     Status: None (Preliminary result)   Collection Time: 07/08/16 11:55 AM  Result Value Ref Range Status   Specimen Description BLOOD LEFT ARM  Final   Special Requests BOTTLES DRAWN AEROBIC AND ANAEROBIC 10CC  Final   Culture   Final    NO GROWTH < 24 HOURS Performed at [facility]    Report Status PENDING  Incomplete    Studies/Results: No results found.   Assessment/Plan: Septic arthritis of prosthetic joint Debrided 7-18 Wound Cx has grown MRSA and Candida albicans His repeat Cx 7-17 showed CNS/MRSE VAC in place Glad to see in f/u Will need abtx for at least 6 weeks.   Staph/MRSE bacteremia (7-11 and 7-12)  Repeat BCx 7-14 ngtd Prev TEE (-) repeat BCx ngtd  Prosthetic Ao valve TEE (-)  DM2 uncontrolled FSG remain elevated.   Total days of antibiotics: 10 vanco, 7 fluconazole, 2 rifampin  Diagnosis: Septic arthritis  Culture Result: MRSE, MRSA, Candida albicans  Allergies  Allergen Reactions  . Ace Inhibitors Other (See Comments)    cough  . Succinylcholine Other (See Comments)    Prolonged sedation  . Morphine And Related Nausea And Vomiting    Discharge antibiotics: Per pharmacy protocol- vancomycin Rifampin 347m po qday Diflucan 2036mIV qday  Aim for Vancomycin trough 15-20 (unless otherwise indicated) Duration: 42 days End Date: 08-10-16  [facility]  Per Protocol:  Labs weekly while on IV antibiotics: _x_ CBC with differential _x_ CMP _x_ CRP _x_ ESR _x_ Vancomycin trough  Fax weekly labs to ([phone] Follow Up Appt: Ileigh Mettler 3-4 weeks       JeBobby Rumpfnfectious Diseases (pager) [phone] 07/09/2016, 4:39 PM  LOS: 10 days

## 2016-07-09 NOTE — Progress Notes (Signed)
Inpatient Diabetes Program Recommendations  AACE/ADA: New Consensus Statement on Inpatient Glycemic Control (2015)  Target Ranges:  Prepandial:   less than 140 mg/dL      Peak postprandial:   less than 180 mg/dL (1-2 hours)      Critically ill patients:  140 - 180 mg/dL   Lab Results  Component Value Date   GLUCAP 86 07/09/2016   HGBA1C 7.6* 02/25/2016  Results for STAFFORD MD, LOUCAS LINSCHEID (MRN [id number]) as of 07/09/2016 10:23  Ref. Range 07/08/2016 12:02 07/08/2016 17:50 07/08/2016 21:55 07/09/2016 08:23  Glucose-Capillary Latest Ref Range: 65-99 mg/dL 331 (H) 217 (H) 313 (H) 86    Review of Glycemic Control  Post-prandial blood sugars elevated. Need tighter glycemic control for healing. AM CBG - 86 mg/dL. May need slight decrease in Lantus dose.  Inpatient Diabetes Program Recommendations:    Decrease Lantus to 23 units QHS Add meal coverage insulin - Novolog 4 units tidwc if pt eats > 50% meal  Will continue to closely follow.  Thank you. Lorenda Peck, RD, LDN, CDE Inpatient Diabetes Coordinator [phone]

## 2016-07-09 NOTE — Progress Notes (Signed)
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 He denies SOB and chest pain. He voices concern over the right knee and possibility of a periprosthetic infection. He reports that he has been getting out of bed with therapy, transferring to the chair. He denies pain in the right knee.    Objective: Vital signs in last 24 hours: Temp:  [97.5 F (36.4 C)-98.2 F (36.8 C)] 97.5 F (36.4 C) (07/21 0623) Pulse Rate:  [54-74] 59 (07/21 0623) Resp:  [18-22] 18 (07/21 0623) BP: (127-143)/(50-61) 143/61 mmHg (07/21 0623) SpO2:  [96 %-98 %] 98 % (07/21 0623)  Intake/Output from previous day: 07/20 0701 - 07/21 0700 In: 440 [P.O.:240; IV Piggyback:200] Out: 600 [Urine:600]   Recent Labs  07/07/16 0645 07/09/16 0055  HGB 8.8* 8.6*    Recent Labs  07/07/16 0645 07/09/16 0055  WBC 12.6* 10.8*  RBC 2.80* 2.75*  HCT 27.0* 26.9*  PLT 385 387    Recent Labs  07/07/16 0645  NA 136  K 4.6  CL 102  CO2 27  BUN 29*  CREATININE 0.85  GLUCOSE 146*  CALCIUM 8.2*   Alert and oriented  Neurologically intact Intact pulses distally Dorsiflexion/Plantar flexion intact Compartment soft  Right knee in ACE bandage with wound vac in place  Assessment/Plan: Sepsis with history of right total knee arthroplasty Generally he is looking much better than he has the past few weeks. Dr. Wynelle Link discussed the plan of care with him. At this stage, he appears to be responsive to the antibiotics. Blood cultures are negative. Dr. Wynelle Link feels that it is a good sign that he has responded so well to the antibiotics and that most likely if he had an intra-articular periprosthetic infection, he would have been more resistant to the antibiotics. We did not want to take down the dressing as the wound vac is set to be changed later today. We would appreciate plastics or the wound vac nurse to attach a photo of the knee once the current wound vac is removed. This will  be a watch and wait process to see how the body responds to continued antibiotics and to see how well the tissue over the right knee prosthesis heals. If he requires intervention, he would likely be looking at a resection with an antibiotic spacer. We will continue to follow the patient. Dr. Gladstone Lighter will be back to see the patient on Monday as he and Dr. Wynelle Link will manage care together.     Prakash Kimberling LAUREN 07/09/2016, 7:51 AM

## 2016-07-09 NOTE — Progress Notes (Signed)
Physical Therapy Treatment Patient Details Name: Stanley JETT MD MRN: [id number] DOB: 07/14/1925 Today's Date: 07/09/2016    History of Present Illness 82 y.o. male with medical history significant of DM, PVD, CAD, S/P CABG, aortic stenosis, S/P AVR,chronic systolic CHF and Parkinson's disease who was admitted with confusion, fever, draining R knee wound, falls at home; sepsis .  Pt with hx of R TKA, R TKR requiring closed manipulation on 05/29/16 and then closed reduction of the patella on 06/09/16. Pt s/p R knee I & D with wound vac placement 07/05/16.    PT Comments    POD # 4 R knee I & D with wound VAC application Applied KI and instructed on use for OOB activity.  Applied B custom shoes.  Assisted OOB to amb a limited distance using B platform EVA walker.  Pt tolerated amb 12 feet. Slow gait and required + 2 standing rest breaks.    Follow Up Recommendations  SNF     Equipment Recommendations       Recommendations for Other Services       Precautions / Restrictions Precautions Precautions: Fall Required Braces or Orthoses: Knee Immobilizer - Right Knee Immobilizer - Right: On when out of bed or walking Other Brace/Splint: knee brace due to lack of extensor mechanism, also has KI in room Restrictions Weight Bearing Restrictions: No    Mobility  Bed Mobility Overal bed mobility: Needs Assistance Bed Mobility: Supine to Sit     Supine to sit: Mod assist;Max assist     General bed mobility comments: A for getting trunk upright.  Can scoot to EOB, but needs cueing and increased time  Transfers Overall transfer level: Needs assistance Equipment used: None Transfers: Sit to/from Stand Sit to Stand: Mod assist;+2 safety/equipment;From elevated surface;Max assist         General transfer comment: cues for hand placement and assisted off elevated bed   Ambulation/Gait Ambulation/Gait assistance: Mod assist;+2 physical assistance Ambulation Distance (Feet): 12  Feet Assistive device: Bilateral platform walker (EVA walker ) Gait Pattern/deviations: Step-to pattern;Trunk flexed Gait velocity: decreased   General Gait Details: used B platform EVA walker for increased support and to increase amb distance plus upright stance time.  + 2 assist for safety and recliner closely following behind.     Stairs            Wheelchair Mobility    Modified Rankin (Stroke Patients Only)       Balance                                    Cognition Arousal/Alertness: Awake/alert Behavior During Therapy: WFL for tasks assessed/performed Overall Cognitive Status: Within Functional Limits for tasks assessed                      Exercises      General Comments        Pertinent Vitals/Pain Pain Assessment: 0-10 Pain Score: 3  Pain Location: R knee Pain Descriptors / Indicators: Grimacing Pain Intervention(s): Monitored during session;Repositioned    Home Living                      Prior Function            PT Goals (current goals can now be found in the care plan section) Progress towards PT goals: Progressing toward goals    Frequency  Min 3X/week    PT Plan Current plan remains appropriate    Co-evaluation             End of Session Equipment Utilized During Treatment: Gait belt Activity Tolerance: Patient limited by fatigue Patient left: in chair;with call bell/phone within reach     Time: 1153-1222 PT Time Calculation (min) (ACUTE ONLY): 29 min  Charges:  $Gait Training: 8-22 mins $Therapeutic Activity: 8-22 mins                    G Codes:      [name]  PTA [facility]  Acute  Rehab Pager      [phone]

## 2016-07-11 LAB — AEROBIC/ANAEROBIC CULTURE (SURGICAL/DEEP WOUND)

## 2016-07-11 LAB — AEROBIC/ANAEROBIC CULTURE W GRAM STAIN (SURGICAL/DEEP WOUND)

## 2016-07-13 ENCOUNTER — Telehealth: Payer: Self-pay | Admitting: *Deleted

## 2016-07-13 LAB — CULTURE, BLOOD (ROUTINE X 2): Culture: NO GROWTH

## 2016-07-13 NOTE — Telephone Encounter (Signed)
Pt called states " Im in the hospital and need to cancel my appt.i'm not going to make it. Returned call to pt, unable to reach. LMOVM for pt to call back with additional information.

## 2016-07-14 ENCOUNTER — Non-Acute Institutional Stay (SKILLED_NURSING_FACILITY): Payer: Medicare Other | Admitting: Internal Medicine

## 2016-07-14 ENCOUNTER — Encounter: Payer: Self-pay | Admitting: Internal Medicine

## 2016-07-14 DIAGNOSIS — D72829 Elevated white blood cell count, unspecified: Secondary | ICD-10-CM | POA: Diagnosis not present

## 2016-07-14 DIAGNOSIS — S83104S Unspecified dislocation of right knee, sequela: Secondary | ICD-10-CM | POA: Diagnosis not present

## 2016-07-14 DIAGNOSIS — E46 Unspecified protein-calorie malnutrition: Secondary | ICD-10-CM

## 2016-07-14 DIAGNOSIS — N3281 Overactive bladder: Secondary | ICD-10-CM | POA: Diagnosis not present

## 2016-07-14 DIAGNOSIS — B379 Candidiasis, unspecified: Secondary | ICD-10-CM | POA: Diagnosis not present

## 2016-07-14 DIAGNOSIS — N400 Enlarged prostate without lower urinary tract symptoms: Secondary | ICD-10-CM

## 2016-07-14 DIAGNOSIS — E119 Type 2 diabetes mellitus without complications: Secondary | ICD-10-CM | POA: Diagnosis not present

## 2016-07-14 DIAGNOSIS — I10 Essential (primary) hypertension: Secondary | ICD-10-CM

## 2016-07-14 DIAGNOSIS — G20A1 Parkinson's disease without dyskinesia, without mention of fluctuations: Secondary | ICD-10-CM

## 2016-07-14 DIAGNOSIS — G20 Parkinson's disease: Secondary | ICD-10-CM

## 2016-07-14 DIAGNOSIS — I2510 Atherosclerotic heart disease of native coronary artery without angina pectoris: Secondary | ICD-10-CM | POA: Diagnosis not present

## 2016-07-14 DIAGNOSIS — R7881 Bacteremia: Secondary | ICD-10-CM

## 2016-07-14 DIAGNOSIS — D62 Acute posthemorrhagic anemia: Secondary | ICD-10-CM

## 2016-07-14 DIAGNOSIS — R5381 Other malaise: Secondary | ICD-10-CM

## 2016-07-14 DIAGNOSIS — B957 Other staphylococcus as the cause of diseases classified elsewhere: Secondary | ICD-10-CM

## 2016-07-14 DIAGNOSIS — K219 Gastro-esophageal reflux disease without esophagitis: Secondary | ICD-10-CM

## 2016-07-14 DIAGNOSIS — F411 Generalized anxiety disorder: Secondary | ICD-10-CM

## 2016-07-14 NOTE — Progress Notes (Signed)
LOCATION: [facility]  PCP: [name], MD   Code Status: DNR  Goals of care: Advanced Directive information Advanced Directives 07/14/2016  Does patient have an advance directive? No  Type of Advance Directive Out of facility DNR (pink MOST or yellow form)  Does patient want to make changes to advanced directive? No - Patient declined  Copy of advanced directive(s) in chart? Yes  Would patient like information on creating an advanced directive? -       Extended Emergency Contact Information Primary Emergency Contact: Stafford,Sue Address: [place], [place] Phone: [phone] Mobile Phone: [phone] Relation: Spouse   Allergies  Allergen Reactions  . Ace Inhibitors Other (See Comments)    cough  . Succinylcholine Other (See Comments)    Prolonged sedation  . Morphine And Related Nausea And Vomiting    Chief Complaint  Patient presents with  . New Admit To SNF    New Admission     HPI:  Patient is a 82 y.o. male seen today for short term rehabilitation post hospital admission from 06/29/16-07/09/16 with sepsis with coagulase negative staphylococcus bacteremia with prosthetic knee joint infection. TEE ruled out endocarditis. He was seen by ID and started on antibiotics. He underwent I&D with plastic surgeon. He is s/p 2 closed reduction to his right knee. He has PMH of DM, parkinson's disease, CAD s/p CABG, AS s/p AVR, PVD among others. He is seen in his room today. His pain is not under control with current pain regimen. He has poor appetite.   Review of Systems:  Constitutional: Negative for fever, chills, diaphoresis.  HENT: Negative for headache, congestion, nasal discharge. Eyes: Negative for blurred vision, discharge.  Respiratory: Negative for cough, shortness of breath and wheezing.   Cardiovascular: Negative for chest pain, palpitations, leg swelling.  Gastrointestinal: Negative for heartburn,  nausea, vomiting, abdominal pain. He had a bowel movement yesterday.  Genitourinary: Negative for dysuria.  Musculoskeletal: Negative for fall.  Skin: Negative for itching, rash.  Neurological: Negative for dizziness. Psychiatric/Behavioral: Negative for depression   Past Medical History:  Diagnosis Date  . Anemia    sees Dr. Julien Nordmann   . Aortic stenosis    S/p pericardial valve  . Arthritis   . BPH (benign prostatic hyperplasia)   . CAD (coronary artery disease)  s/p CABG    sees Dr. Loralie Champagne   . Diabetes mellitus   . Failed total knee, right ([place])   . GERD (gastroesophageal reflux disease)   . H pylori ulcer    Gastric ulcer  . Hypertension    pt denies  . Lumbar spinal stenosis    sees Dr. Sherwood Gambler   . Mobitz type 1 second degree atrioventricular block   . Peripheral vascular disease ([place]) 03-06-12   aortogram showed severe bilateral unreconstructible tibial artery disease  . Shingles 05-12-12  . Shortness of breath    uses cpap   Past Surgical History:  Procedure Laterality Date  . ABDOMINAL AORTAGRAM N/A 02/25/2012   Procedure: ABDOMINAL Maxcine Ham;  Surgeon: Elam Dutch, MD;  Location: [facility] CATH LAB;  Service: Cardiovascular;  Laterality: N/A;  . AMPUTATION  01/19/2012   Procedure: AMPUTATION DIGIT;  Surgeon: Colin Rhein, MD;  Location: [facility];  Service: Orthopedics;  Laterality: Right;  right great toe amputation through MTP joint  . AORTIC VALVE REPLACEMENT    . APPLICATION OF A-CELL OF EXTREMITY  Right 07/05/2016   Procedure: APPLICATION OF A-CELL OF EXTREMITY;  Surgeon: Loel Lofty Dillingham, DO;  Location: [facility];  Service: Plastics;  Laterality: Right;  . CARDIAC CATHETERIZATION    . CATARACT EXTRACTION, BILATERAL    . CLOSED REDUCTION PATELLAR Right 06/09/2016   Procedure: CLOSED REDUCTION PATELLAR;  Surgeon: Gaynelle Arabian, MD;  Location: [facility];  Service: Orthopedics;  Laterality: Right;  . COLONOSCOPY WITH PROPOFOL N/A 10/17/2014    Procedure: COLONOSCOPY WITH PROPOFOL;  Surgeon: Gatha Mayer, MD;  Location: [facility];  Service: Endoscopy;  Laterality: N/A;  . ENTEROSCOPY N/A 10/15/2014   Procedure: ENTEROSCOPY;  Surgeon: Gatha Mayer, MD;  Location: [facility];  Service: Endoscopy;  Laterality: N/A;  . FOOT ARTHROTOMY Right   . HUMERUS FRACTURE SURGERY    . INCISION AND DRAINAGE OF WOUND Right 07/05/2016   Procedure: IRRIGATION AND DEBRIDEMENT WOUND RIGHT KNEE ;  Surgeon: Loel Lofty Dillingham, DO;  Location: [facility];  Service: Plastics;  Laterality: Right;  . KNEE CLOSED REDUCTION N/A 05/29/2016   Procedure: CLOSED MANIPULATION KNEE;  Surgeon: Latanya Maudlin, MD;  Location: [facility];  Service: Orthopedics;  Laterality: N/A;  . REPLACEMENT TOTAL KNEE Bilateral   . ROTATOR CUFF REPAIR Right   . TEE WITHOUT CARDIOVERSION N/A 07/06/2016   Procedure: TRANSESOPHAGEAL ECHOCARDIOGRAM (TEE);  Surgeon: Pixie Casino, MD;  Location: [facility] ENDOSCOPY;  Service: Cardiovascular;  Laterality: N/A;  . TOE AMPUTATION     rt 2nd  . TONSILLECTOMY     Social History:   reports that he has never smoked. He has never used smokeless tobacco. He reports that he drinks about 4.2 oz of alcohol per week . He reports that he does not use drugs.  Family History  Problem Relation Age of Onset  . Diabetes type II Mother   . Hypertension Mother   . Diabetes type II Father   . Hypertension Father   . Diabetes Father   . Colon cancer Neg Hx   . Esophageal cancer Neg Hx   . Rectal cancer Neg Hx   . Stomach cancer Neg Hx   . Heart attack Neg Hx   . Stroke Neg Hx     Medications:   Medication List       Accurate as of 07/14/16  3:57 PM. Always use your most recent med list.          aspirin 81 MG tablet Take 81 mg by mouth daily.   calcium-vitamin D 500-200 MG-UNIT tablet Take 1 tablet by mouth 2 (two) times daily.   carbidopa-levodopa 25-100 MG tablet Commonly known as:  SINEMET IR Take 2 tablets by mouth daily.     carbidopa-levodopa 25-100 MG tablet Commonly known as:  SINEMET IR Take 1 tablet by mouth every evening.   chlorhexidine 0.12 % solution Commonly known as:  PERIDEX Use as directed 15 mLs in the mouth or throat 2 (two) times daily as needed (sore mouth).   diclofenac 75 MG EC tablet Commonly known as:  VOLTAREN Take 75 mg by mouth daily.   finasteride 5 MG tablet Commonly known as:  PROSCAR Take 5 mg by mouth daily.   fluconazole 200-0.9 MG/100ML-% IVPB Commonly known as:  DIFLUCAN Inject 100 mLs (200 mg total) into the vein daily.   glucose blood test strip Commonly known as:  ONE TOUCH TEST STRIPS Dispense one touch ultra, test TID and diagnosis code is E11.9   HYDROcodone-acetaminophen 10-325 MG tablet Commonly known as:  NORCO Take 1 tablet  by mouth every 6 (six) hours as needed for severe pain.   insulin glargine 100 UNIT/ML injection Commonly known as:  LANTUS Inject 0.25 mLs (25 Units total) into the skin daily.   INTEGRA PLUS Caps TAKE 1 CAPSULE EVERY MORNING   linagliptin 5 MG Tabs tablet Commonly known as:  TRADJENTA Take 1 tablet (5 mg total) by mouth daily.   LORazepam 0.5 MG tablet Commonly known as:  ATIVAN Take 0.5 mg by mouth every 6 (six) hours as needed for anxiety ([place]).   multivitamin with minerals Tabs tablet Take 1 tablet by mouth daily.   MYRBETRIQ 50 MG Tb24 tablet Generic drug:  mirabegron ER Take 50 mg by mouth daily.   onetouch ultrasoft lancets Test once per day and diagnosis code is E 11.9   pantoprazole 40 MG tablet Commonly known as:  PROTONIX Take 1 tablet (40 mg total) by mouth 2 (two) times daily.   pioglitazone 45 MG tablet Commonly known as:  ACTOS TAKE 1 TABLET EVERY DAY   prochlorperazine 10 MG tablet Commonly known as:  COMPAZINE Take 10 mg by mouth every 6 (six) hours as needed. Nausea/vomiting   rifampin 300 MG capsule Commonly known as:  RIFADIN Take 1 capsule (300 mg total) by mouth daily.    tamsulosin 0.4 MG Caps capsule Commonly known as:  FLOMAX Take 0.4 mg by mouth daily after breakfast.   UNABLE TO FIND Med Name: Med pass 90 mL by mouth 4 times daily   vancomycin 1-5 GM/200ML-% Soln Commonly known as:  VANCOCIN Inject 200 mLs (1,000 mg total) into the vein every 36 (thirty-six) hours.   vitamin A 10000 UNIT capsule Take 10,000 Units by mouth daily.   zinc sulfate 220 (50 Zn) MG capsule Take 220 mg by mouth daily.       Immunizations: Immunization History  Administered Date(s) Administered  . Influenza Split 12/01/2012  . Influenza Whole 09/17/2010  . Influenza, High Dose Seasonal PF 10/02/2014, 08/22/2015  . Influenza,inj,Quad PF,36+ Mos 09/13/2013  . Pneumococcal Conjugate-13 04/09/2014  . Pneumococcal Polysaccharide-23 08/22/2015  . Tdap 08/22/2015  . Zoster 04/19/2012     Physical Exam:  Vitals:   07/14/16 1545  BP: (!) 137/58  Pulse: (!) 57  Resp: 18  Temp: (!) 96 F (35.6 C)  TempSrc: Oral  SpO2: 98%  Weight: 173 lb (78.5 kg)  Height: 5\' 7"  (1.702 m)   Body mass index is 27.1 kg/m.  General- elderly male, well built, in no acute distress Head- normocephalic, atraumatic Nose- no nasal discharge Throat- moist mucus membrane Eyes- PERRLA, EOMI, no pallor, no icterus, no discharge Neck- no cervical lymphadenopathy Cardiovascular- normal s1,s2, no murmur Respiratory- bilateral clear to auscultation, no wheeze, no rhonchi, no crackles, no use of accessory muscles Abdomen- bowel sounds present, soft, non tender Musculoskeletal- able to move all 4 extremities, limited right knee range of motion, arthritis changes to her fingers Neurological- alert and oriented to person, place and time Skin- warm and dry, right elbow skin tear, wound vac to right knee area, right arm PICC line Psychiatry- normal mood and affect    Labs reviewed: Basic Metabolic Panel:  Recent Labs  07/03/16 0423 07/04/16 0839 07/06/16 0431 07/07/16 0645  NA  135 133*  --  136  K 4.4 4.5  --  4.6  CL 101 99*  --  102  CO2 27 27  --  27  GLUCOSE 240* 226*  --  146*  BUN 32* 28*  --  29*  CREATININE 0.86 0.82 0.89 0.85  CALCIUM 8.8* 8.4*  --  8.2*   Liver Function Tests:  Recent Labs  02/25/16 1213 06/29/16 0847  AST 18 37  ALT 4 6*  ALKPHOS 71 78  BILITOT 0.5 0.5  PROT 6.4 6.7  ALBUMIN 4.2 3.2*   No results for input(s): LIPASE, AMYLASE in the last 8760 hours. No results for input(s): AMMONIA in the last 8760 hours. CBC:  Recent Labs  02/27/16 1246 05/04/16 1336  06/29/16 0847  07/04/16 0839 07/07/16 0645 07/09/16 0055  WBC 9.1 8.8  --  8.2  < > 9.4 12.6* 10.8*  NEUTROABS 7.3* 6.3  --  6.7  --   --   --   --   HGB 10.9* 10.0*  < > 10.5*  < > 9.1* 8.8* 8.6*  HCT 33.4* 30.4*  < > 31.4*  < > 27.2* 27.0* 26.9*  MCV 94.4 93.8  --  95.2  < > 95.8 96.4 97.8  PLT 202 251  --  164  < > 298 385 387  < > = values in this interval not displayed. Cardiac Enzymes: No results for input(s): CKTOTAL, CKMB, CKMBINDEX, TROPONINI in the last 8760 hours. BNP: Invalid input(s): POCBNP CBG:  Recent Labs  07/09/16 0823 07/09/16 1223 07/09/16 1730  GLUCAP 86 344* 164*    Radiological Exams: Dg Chest 2 View  Result Date: 06/29/2016 CLINICAL DATA:  82 year old male with a history of fever EXAM: CHEST  2 VIEW COMPARISON:  10/14/2014, 05/14/2014 FINDINGS: Cardiomediastinal silhouette unchanged with cardiomegaly. Surgical changes of prior median sternotomy and CABG, with aortic valve replacement. Low lung volumes with asymmetric elevation the right hemidiaphragm. Coarsened interstitial markings bilaterally. No confluent airspace disease. No pleural effusion or pneumothorax. Degenerative changes of the left greater than right shoulder. Surgical changes of the left humerus incompletely imaged. Calcifications of the aorta IMPRESSION: Chronic lung changes and low lung volumes without evidence of superimposed acute cardiopulmonary disease. Surgical  changes of median sternotomy, CABG, aortic valve replacement. Aortic atherosclerosis. Signed, Dulcy Fanny. Earleen Newport, DO [facility] Radiology Electronically Signed   By: Corrie Mckusick D.O.   On: 06/29/2016 09:17   Dg Knee 2 Views Right  Result Date: 06/29/2016 CLINICAL DATA:  Fever EXAM: RIGHT KNEE - 1-2 VIEW COMPARISON:  06/09/2016 FINDINGS: No joint effusion. There is scratch set the hardware components of a right total knee arthroplasty device with long stems identified. No periprosthetic fracture or subluxation. No radio-opaque foreign bodies or soft tissue calcifications. IMPRESSION: 1. No acute findings. 2. Previous right knee arthroplasty. Electronically Signed   By: Kerby Moors M.D.   On: 06/29/2016 09:17    Assessment/Plan   Physical deconditioning Will have patient work with PT/OT as tolerated to regain strength and restore function.  Fall precautions are in place.  Staphylococcus bacteremia Continue course of vancomycin and rifampin and follow with weekly labs. ID to follow. Monitor temp curve and continue picc line care  Right knee dislocation S/p closed reduction. Will have him work with physical therapy and occupational therapy team to help with gait training and muscle strengthening exercises.fall precautions. Skin care. Encourage to be out of bed. Has orthopedic follow up. Continue norco 10-325 mg and change to 1 tab q4h prn pain. Get PMR consult.   Leukocytosis Continue antibiotics and monitor wbc  Protein calorie malnutrition Get RD consult, to provide medpass supplement. Continue MVI and zinc supplement  Candida infection Continue iv diflucan for now and to follow with  ID  Blood loss anemia Monitor cbc. Sp iv iron in hospital.  OAB Continue myrbetriq  DM type 2  Lab Results  Component Value Date   HGBA1C 7.6 (H) 02/25/2016   Monitor cbg, continue actos, tradjenta with lantus 25 u daily  Parkinson's  disease Continue his sinemet  gerd Continue protonix 40 mg bid  GAD Stable, continue lorazepam 0.5 mg q6h prn  BPH Continue flomax and proscar  CAD Continue aspirin and statin  HTN Monitor bp, off antihypertensives at present    Goals of care: short term rehabilitation   Labs/tests ordered: cbc, cmp 07/15/16  Family/ staff Communication: reviewed care plan with patient and nursing supervisor    Blanchie Serve, MD Internal Medicine [facility] [place], [place] [address] Cell Phone (Monday-Friday 8 am - 5 pm): [phone] On Call: [phone] and follow prompts after 5 pm and on weekends Office Phone: [phone] Office Fax: [phone]

## 2016-07-27 ENCOUNTER — Other Ambulatory Visit: Payer: Medicare Other

## 2016-08-03 ENCOUNTER — Ambulatory Visit: Payer: Medicare Other | Admitting: Internal Medicine

## 2016-08-20 DEATH — deceased

## 2016-09-03 ENCOUNTER — Ambulatory Visit: Payer: Medicare Other | Admitting: Neurology

## 2016-09-09 ENCOUNTER — Encounter (HOSPITAL_COMMUNITY): Payer: Self-pay | Admitting: Plastic Surgery

## 2016-12-03 ENCOUNTER — Other Ambulatory Visit: Payer: Self-pay | Admitting: Nurse Practitioner

## 2017-11-30 IMAGING — CR DG KNEE COMPLETE 4+V*R*
4 series · 4 of 4 positions shown · non-contrast
Comparison: [DATE]

CLINICAL DATA: Initial encounter for Pt had his right knee
dislocated last [REDACTED] and tonight he fell again tonight at home
and thinks he may have dislocated it again. Hx multiple surgeries

EXAM:
RIGHT KNEE - COMPLETE 4+ VIEW

[x knee ap right (1 of 3)]
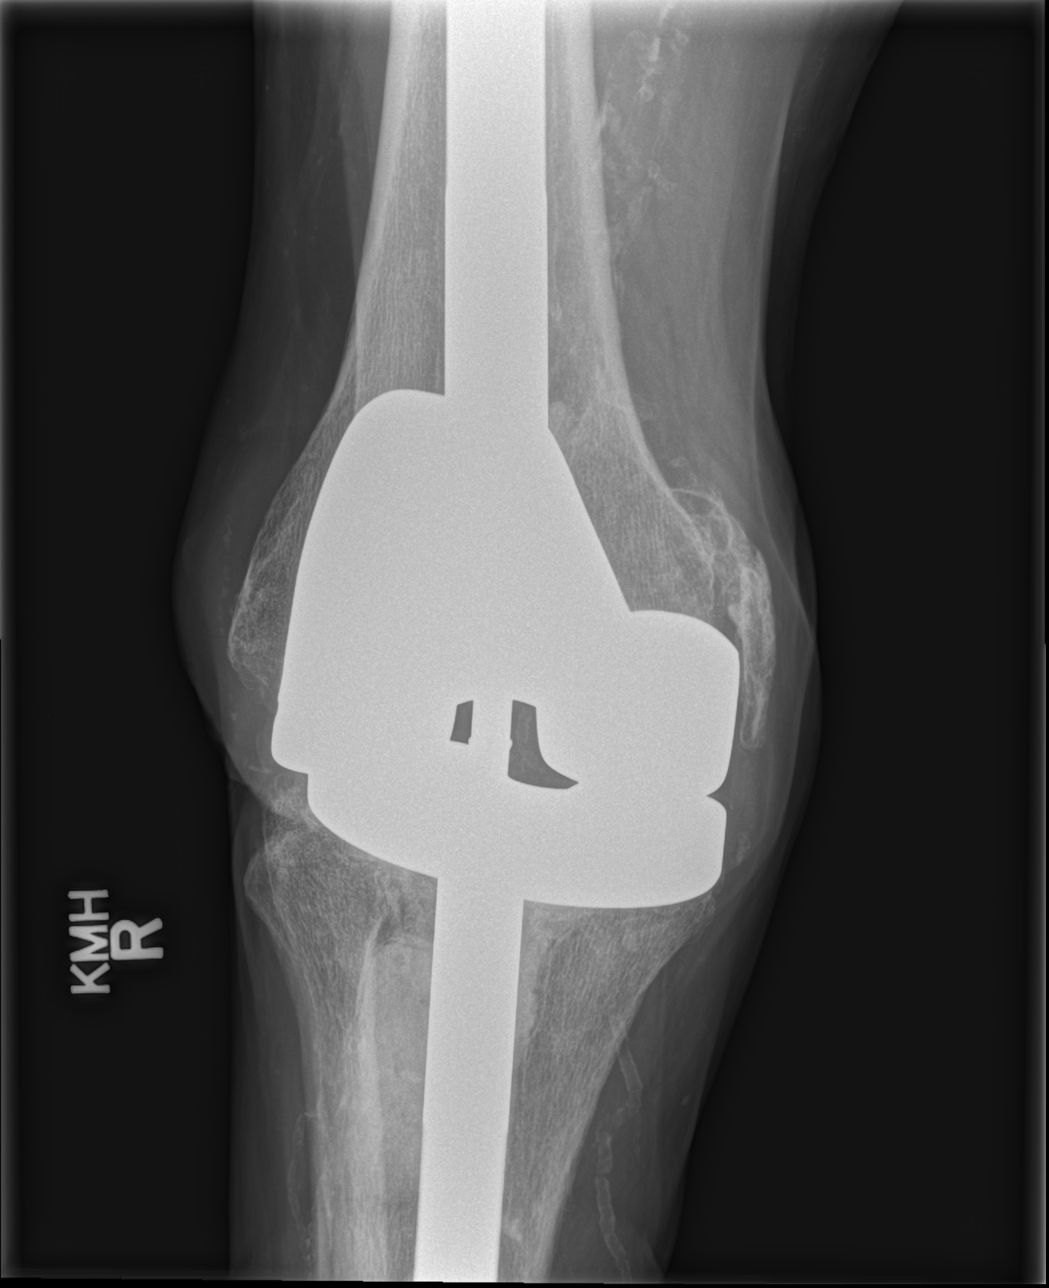

[x knee ap right (2 of 3)]
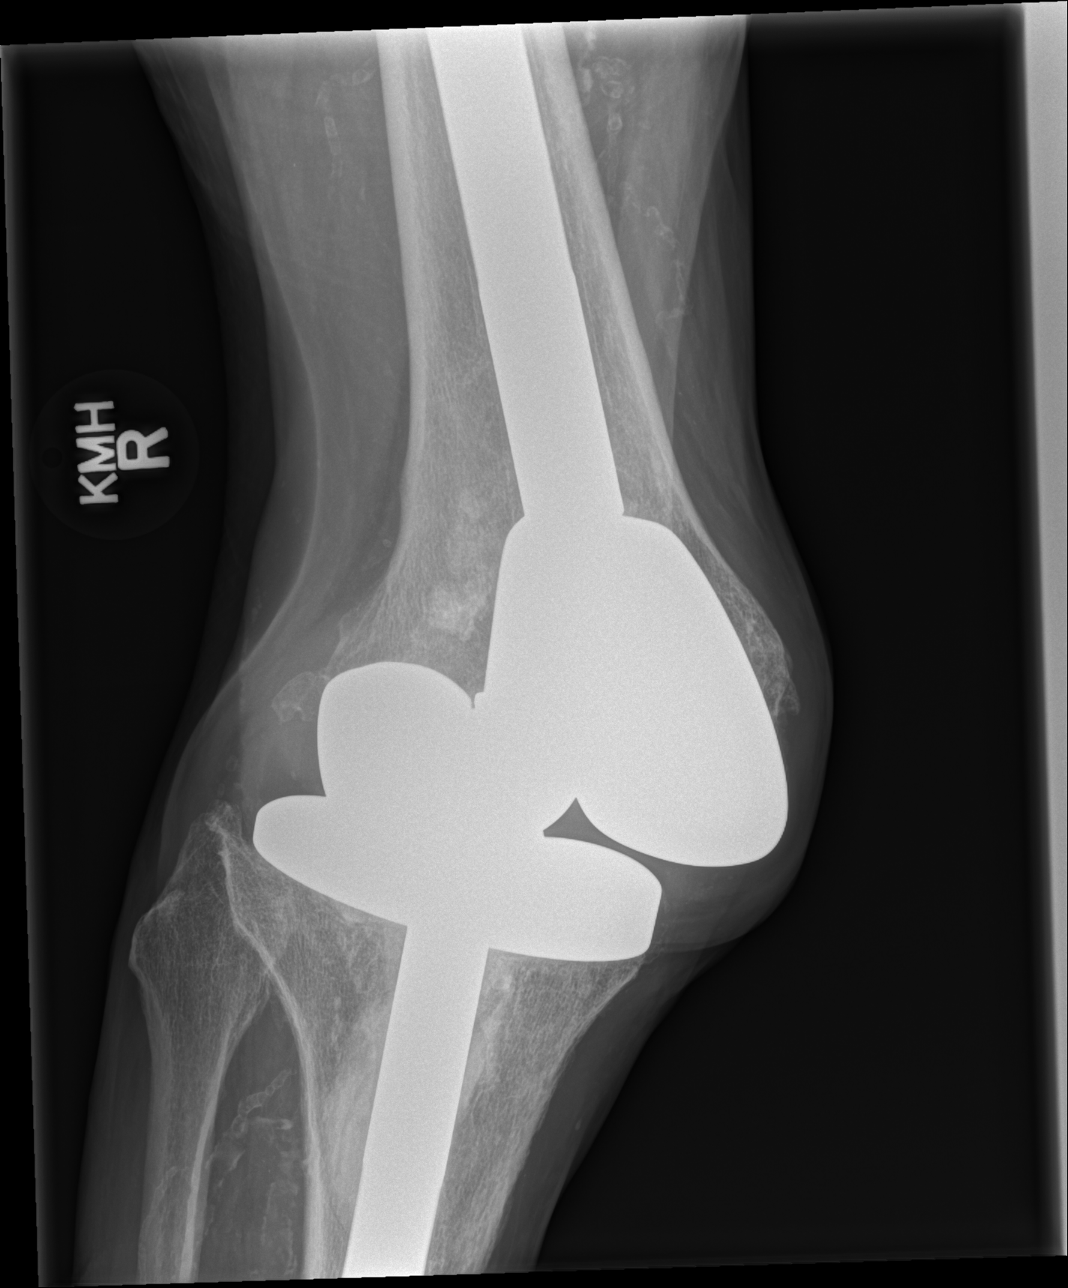

[x knee ap right (3 of 3)]
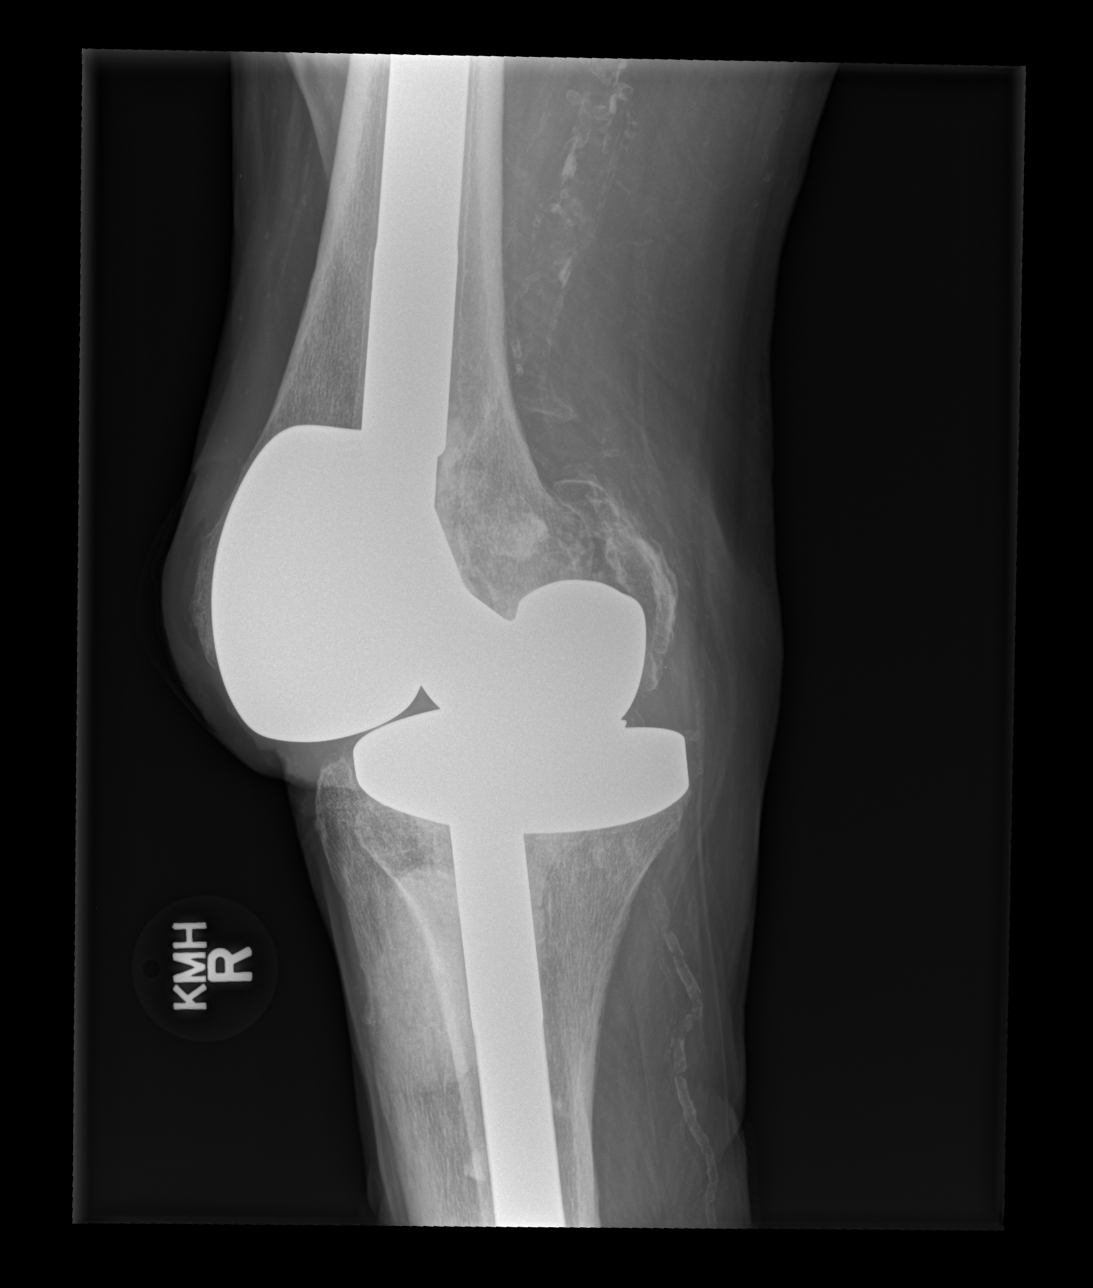

[x knee lat right]
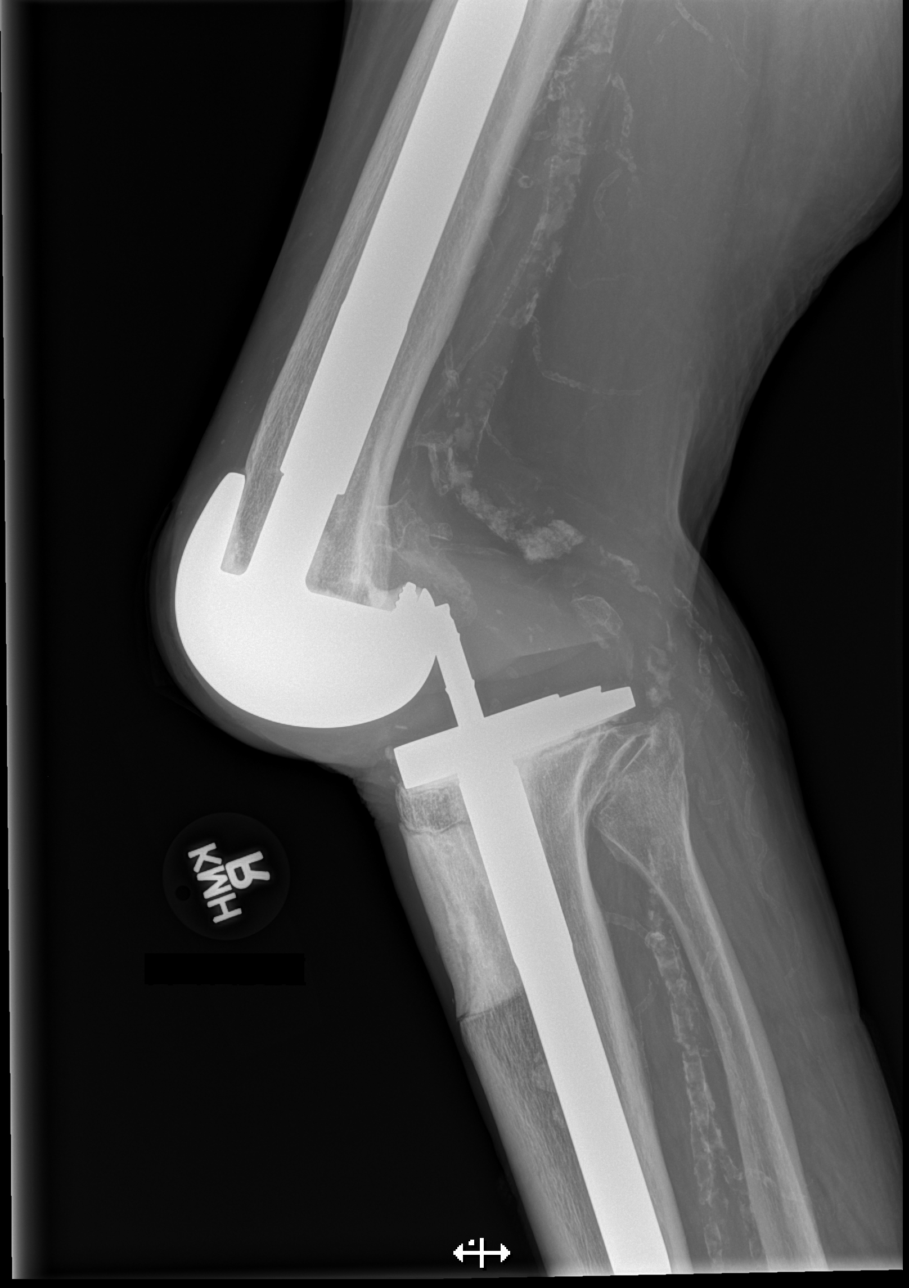

[4 of 4 positions shown; findings below may reference images not displayed]

FINDINGS: Long-stem right knee arthroplasty. Vascular calcifications.
Posterior dislocation of the tibial component relative to the
femoral component. No knee joint effusion. No complicating fracture.
IMPRESSION: Posterior dislocation of the tibial component of a total knee
arthroplasty.
